# Patient Record
Sex: Female | Born: 1940 | Race: White | Hispanic: No | Smoking: Former smoker
Health system: Southern US, Community
[De-identification: ages and names within clinical notes are randomized; demographics above are authoritative.]

## PROBLEM LIST (undated history)

## (undated) DIAGNOSIS — M5136 Other intervertebral disc degeneration, lumbar region: Secondary | ICD-10-CM

## (undated) DIAGNOSIS — G43909 Migraine, unspecified, not intractable, without status migrainosus: Secondary | ICD-10-CM

## (undated) DIAGNOSIS — H269 Unspecified cataract: Secondary | ICD-10-CM

## (undated) DIAGNOSIS — M1990 Unspecified osteoarthritis, unspecified site: Secondary | ICD-10-CM

## (undated) DIAGNOSIS — F32A Depression, unspecified: Secondary | ICD-10-CM

## (undated) DIAGNOSIS — K219 Gastro-esophageal reflux disease without esophagitis: Secondary | ICD-10-CM

## (undated) DIAGNOSIS — S20419A Abrasion of unspecified back wall of thorax, initial encounter: Secondary | ICD-10-CM

## (undated) DIAGNOSIS — I2699 Other pulmonary embolism without acute cor pulmonale: Secondary | ICD-10-CM

## (undated) DIAGNOSIS — M51369 Other intervertebral disc degeneration, lumbar region without mention of lumbar back pain or lower extremity pain: Secondary | ICD-10-CM

## (undated) DIAGNOSIS — S32000A Wedge compression fracture of unspecified lumbar vertebra, initial encounter for closed fracture: Secondary | ICD-10-CM

## (undated) DIAGNOSIS — M810 Age-related osteoporosis without current pathological fracture: Secondary | ICD-10-CM

## (undated) DIAGNOSIS — K8020 Calculus of gallbladder without cholecystitis without obstruction: Secondary | ICD-10-CM

## (undated) DIAGNOSIS — S82841A Displaced bimalleolar fracture of right lower leg, initial encounter for closed fracture: Secondary | ICD-10-CM

## (undated) DIAGNOSIS — S5291XA Unspecified fracture of right forearm, initial encounter for closed fracture: Secondary | ICD-10-CM

## (undated) DIAGNOSIS — F329 Major depressive disorder, single episode, unspecified: Secondary | ICD-10-CM

## (undated) HISTORY — DX: Migraine, unspecified, not intractable, without status migrainosus: G43.909

## (undated) HISTORY — PX: APPENDECTOMY: SHX54

## (undated) HISTORY — DX: Depression, unspecified: F32.A

## (undated) HISTORY — PX: JOINT REPLACEMENT: SHX530

## (undated) HISTORY — PX: TONSILLECTOMY AND ADENOIDECTOMY: SUR1326

## (undated) HISTORY — DX: Age-related osteoporosis without current pathological fracture: M81.0

## (undated) HISTORY — PX: WISDOM TOOTH EXTRACTION: SHX21

## (undated) HISTORY — DX: Major depressive disorder, single episode, unspecified: F32.9

## (undated) HISTORY — PX: TOTAL KNEE ARTHROPLASTY: SHX125

## (undated) HISTORY — PX: ABDOMINAL HYSTERECTOMY: SHX81

## (undated) HISTORY — DX: Unspecified cataract: H26.9

## (undated) HISTORY — PX: TRIPLE SUBTALAR FUSION: SHX6633

## (undated) HISTORY — DX: Calculus of gallbladder without cholecystitis without obstruction: K80.20

## (undated) HISTORY — DX: Unspecified osteoarthritis, unspecified site: M19.90

## (undated) HISTORY — DX: Gastro-esophageal reflux disease without esophagitis: K21.9

## (undated) HISTORY — PX: FRACTURE SURGERY: SHX138

## (undated) HISTORY — PX: EYE SURGERY: SHX253

---

## 1998-11-30 ENCOUNTER — Emergency Department (HOSPITAL_COMMUNITY): Admission: EM | Admit: 1998-11-30 | Discharge: 1998-11-30 | Payer: Self-pay

## 2000-11-23 DIAGNOSIS — F32A Depression, unspecified: Secondary | ICD-10-CM | POA: Insufficient documentation

## 2014-03-06 DIAGNOSIS — K52839 Microscopic colitis, unspecified: Secondary | ICD-10-CM | POA: Insufficient documentation

## 2016-11-10 ENCOUNTER — Other Ambulatory Visit: Payer: Self-pay | Admitting: Pediatrics

## 2016-11-10 DIAGNOSIS — Z1231 Encounter for screening mammogram for malignant neoplasm of breast: Secondary | ICD-10-CM

## 2016-12-09 ENCOUNTER — Ambulatory Visit
Admission: RE | Admit: 2016-12-09 | Discharge: 2016-12-09 | Disposition: A | Discharge status: Home / Self Care | Payer: Medicare Other | Source: Ambulatory Visit | Attending: Pediatrics | Admitting: Pediatrics

## 2016-12-09 ENCOUNTER — Encounter (HOSPITAL_COMMUNITY): Payer: Self-pay

## 2016-12-09 DIAGNOSIS — Z1231 Encounter for screening mammogram for malignant neoplasm of breast: Secondary | ICD-10-CM | POA: Diagnosis present

## 2016-12-13 ENCOUNTER — Inpatient Hospital Stay
Admission: RE | Admit: 2016-12-13 | Discharge: 2016-12-13 | Disposition: A | Discharge status: Home / Self Care | Payer: Self-pay | Source: Ambulatory Visit | Attending: *Deleted | Admitting: *Deleted

## 2016-12-13 ENCOUNTER — Other Ambulatory Visit: Payer: Self-pay | Admitting: *Deleted

## 2016-12-13 DIAGNOSIS — Z9289 Personal history of other medical treatment: Secondary | ICD-10-CM

## 2017-04-13 ENCOUNTER — Other Ambulatory Visit: Payer: Self-pay | Admitting: Pediatrics

## 2017-04-13 DIAGNOSIS — M810 Age-related osteoporosis without current pathological fracture: Secondary | ICD-10-CM

## 2017-05-12 DIAGNOSIS — K8020 Calculus of gallbladder without cholecystitis without obstruction: Secondary | ICD-10-CM | POA: Insufficient documentation

## 2017-06-26 ENCOUNTER — Ambulatory Visit
Admission: RE | Admit: 2017-06-26 | Discharge: 2017-06-26 | Disposition: A | Discharge status: Home / Self Care | Payer: Medicare Other | Source: Ambulatory Visit | Attending: Pediatrics | Admitting: Pediatrics

## 2017-06-26 DIAGNOSIS — M810 Age-related osteoporosis without current pathological fracture: Secondary | ICD-10-CM | POA: Diagnosis present

## 2017-06-26 DIAGNOSIS — M818 Other osteoporosis without current pathological fracture: Secondary | ICD-10-CM | POA: Diagnosis not present

## 2017-07-31 ENCOUNTER — Encounter: Payer: Self-pay | Admitting: Family

## 2017-07-31 ENCOUNTER — Ambulatory Visit (INDEPENDENT_AMBULATORY_CARE_PROVIDER_SITE_OTHER): Payer: Medicare Other | Admitting: Family

## 2017-07-31 DIAGNOSIS — K219 Gastro-esophageal reflux disease without esophagitis: Secondary | ICD-10-CM | POA: Diagnosis not present

## 2017-07-31 DIAGNOSIS — Z1211 Encounter for screening for malignant neoplasm of colon: Secondary | ICD-10-CM

## 2017-07-31 DIAGNOSIS — G4700 Insomnia, unspecified: Secondary | ICD-10-CM | POA: Diagnosis not present

## 2017-07-31 DIAGNOSIS — F325 Major depressive disorder, single episode, in full remission: Secondary | ICD-10-CM | POA: Diagnosis not present

## 2017-07-31 DIAGNOSIS — Z23 Encounter for immunization: Secondary | ICD-10-CM

## 2017-07-31 NOTE — Assessment & Plan Note (Signed)
Stable on luvox. Will continue.

## 2017-07-31 NOTE — Assessment & Plan Note (Signed)
Stable on trazodone. Will continue

## 2017-07-31 NOTE — Progress Notes (Signed)
Subjective:    Patient ID: Claudia Andersen, female    DOB: [birth year], 76 y.o.   MRN: [id number]  CC: Claudia Andersen is a 76 y.o. female who presents today to establish care.    HPI: Has moved to [facility] and wanted closer pcp. Prior pcp had been in [place].  Feeling well today. No Complaints.   Dr Joselyn Arrow- compression fracture; pain controlled;  occasionally takes prn hydrocodone.   GERD- controlled on protonix  Insomnia- doing well on trazodone  Depression - on Luvox. Has been on for a long time. No thoughts or hurting herself or anyone else.   No longer does pap smears or colonscopy. NO  Personal h/o polyps. No family h/o colon cancer  h/o hysterectomy  Mammogram 2018 normal per patient  Interested in Cologuard       HISTORY:  No past medical history on file. No past surgical history on file. Family History  Problem Relation Age of Onset  . Breast cancer Neg Hx   . Colon cancer Neg Hx     Allergies: Aspirin and Nsaids No current outpatient prescriptions on file prior to visit.   No current facility-administered medications on file prior to visit.     Social History  Substance Use Topics  . Smoking status: Not on file  . Smokeless tobacco: Not on file  . Alcohol use Not on file    Review of Systems  Constitutional: Negative for chills and fever.  Respiratory: Negative for cough.   Cardiovascular: Negative for chest pain and palpitations.  Gastrointestinal: Negative for nausea and vomiting.  Psychiatric/Behavioral: Negative for suicidal ideas.      Objective:    BP 128/70   Pulse 66   Temp 97.6 F (36.4 C) (Oral)   Resp 16   Ht 5\' 2"  (1.575 m)   Wt 165 lb 3.2 oz (74.9 kg)   SpO2 93%   BMI 30.22 kg/m  BP Readings from Last 3 Encounters:  07/31/17 128/70   Wt Readings from Last 3 Encounters:  07/31/17 165 lb 3.2 oz (74.9 kg)    Physical Exam  Constitutional: She appears well-developed and well-nourished.  Eyes: Conjunctivae are  normal.  Cardiovascular: Normal rate, regular rhythm, normal heart sounds and normal pulses.   Pulmonary/Chest: Effort normal and breath sounds normal. She has no wheezes. She has no rhonchi. She has no rales.  Neurological: She is alert.  Skin: Skin is warm and dry.  Psychiatric: She has a normal mood and affect. Her speech is normal and behavior is normal. Thought content normal.  Vitals reviewed.      Assessment & Plan:   Problem List Items Addressed This Visit      Digestive   GERD (gastroesophageal reflux disease)    Stable. Controlled on PPI      Relevant Medications   pantoprazole (PROTONIX) 40 MG tablet     Other   Screen for colon cancer    cologuard ordered      Depression, major, single episode, complete remission (HCC)    Stable on luvox. Will continue.       Relevant Medications   traZODone (DESYREL) 50 MG tablet   fluvoxaMINE (LUVOX) 100 MG tablet   Insomnia    Stable on trazodone. Will continue          I am having Ms. Cutter maintain her traZODone, HYDROcodone-acetaminophen, fluvoxaMINE, pantoprazole, atorvastatin, naproxen sodium, aspirin EC, fluticasone, and calcium carbonate.   Meds ordered this encounter  Medications  .  traZODone (DESYREL) 50 MG tablet    Sig: Take 1-2 tablets by mouth at bedtime as needed.  Marland Kitchen HYDROcodone-acetaminophen (NORCO) 10-325 MG tablet    Sig: Take 1 tablet by mouth every 6 (six) hours.  . fluvoxaMINE (LUVOX) 100 MG tablet    Sig: Take 0.5-1 tablets by mouth daily.  . pantoprazole (PROTONIX) 40 MG tablet    Sig: Take 1 tablet by mouth 2 (two) times daily.  Marland Kitchen atorvastatin (LIPITOR) 10 MG tablet    Sig: Take 1 tablet by mouth daily.  . naproxen sodium (ANAPROX) 220 MG tablet    Sig: Take 220 mg by mouth.  Marland Kitchen aspirin EC 81 MG tablet    Sig: Take 81 mg by mouth daily.  . fluticasone (FLONASE) 50 MCG/ACT nasal spray    Sig: Place into both nostrils daily.  . calcium carbonate (OSCAL) 1500 (600 Ca) MG TABS tablet     Sig: Take by mouth 2 (two) times daily with a meal.    Return precautions given.   Risks, benefits, and alternatives of the medications and treatment plan prescribed today were discussed, and patient expressed understanding.   Education regarding symptom management and diagnosis given to patient on AVS.  Continue to follow with Burnard Hawthorne, FNP for routine health maintenance.   [name] and I agreed with plan.   Mable Paris, FNP

## 2017-07-31 NOTE — Assessment & Plan Note (Signed)
Stable Controlled on PPI 

## 2017-07-31 NOTE — Patient Instructions (Addendum)
My pleasure meeting you.  Cologuard ordered  Let me if you need anything.

## 2017-07-31 NOTE — Assessment & Plan Note (Signed)
cologuard ordered.

## 2017-08-07 ENCOUNTER — Encounter: Payer: Self-pay | Admitting: Family

## 2017-08-14 LAB — COLOGUARD

## 2017-08-16 ENCOUNTER — Telehealth: Payer: Self-pay | Admitting: Family

## 2017-08-16 NOTE — Telephone Encounter (Signed)
Patient was informed of results.  Patient understood and no questions, comments, or concerns at this time.  

## 2017-08-16 NOTE — Telephone Encounter (Signed)
Call pt     let  know Cologuard NEGATIVE which indicates lower likelihood of cancer or pre cancer. You will need to repeat test in 3 years or sooner if any symptoms including blood in stool, change in bowel habits.   

## 2017-08-28 ENCOUNTER — Telehealth: Payer: Self-pay | Admitting: Family

## 2017-08-28 ENCOUNTER — Other Ambulatory Visit: Payer: Self-pay | Admitting: *Deleted

## 2017-08-28 MED ORDER — FLUVOXAMINE MALEATE 100 MG PO TABS: 50.0000 mg | ORAL_TABLET | Freq: Every day | ORAL | 3 refills | Status: DC

## 2017-08-28 NOTE — Telephone Encounter (Signed)
Notified pt that requested medication has been refilled.

## 2017-08-28 NOTE — Telephone Encounter (Signed)
Copied from [place] [phone]. Topic: Quick Communication - See Telephone Encounter >> Aug 28, 2017 12:43 PM Hewitt Shorts wrote: CRM for notification. See Telephone encounter for:  Pt is needing a refill on fluvoxamine [facility] [address] [facility] number  44/46/19.

## 2017-09-27 ENCOUNTER — Telehealth: Payer: Self-pay

## 2017-09-27 NOTE — Telephone Encounter (Signed)
Pt. Called to report she is a new pt. Of Mrs. Arnett and when she saw her , forgot to tell her she takes Fosamax. She will have her drug store call us with dosage. She will be needed a refill soon.

## 2017-10-04 DIAGNOSIS — M7730 Calcaneal spur, unspecified foot: Secondary | ICD-10-CM | POA: Insufficient documentation

## 2017-10-25 ENCOUNTER — Other Ambulatory Visit: Payer: Self-pay | Admitting: *Deleted

## 2017-10-25 ENCOUNTER — Telehealth: Payer: Self-pay | Admitting: Family

## 2017-10-25 MED ORDER — PANTOPRAZOLE SODIUM 40 MG PO TBEC: 40.0000 mg | DELAYED_RELEASE_TABLET | Freq: Two times a day (BID) | ORAL | 3 refills | Status: DC

## 2017-10-25 NOTE — Telephone Encounter (Signed)
Copied from [place] [id number]. Topic: Quick Communication - Rx Refill/Question >> Oct 25, 2017  3:09 PM Yvette Rack wrote: Medication: pantoprazole (PROTONIX) 40 MG tablet   Has the patient contacted their pharmacy? Yes.  Patient states that she will call pharmacy as soon as she get off the phone with me    (Agent: If no, request that the patient contact the pharmacy for the refill.)   Preferred Pharmacy (with phone number or street name): [id number] [address], [place] [phone] (Phone) [phone] (Fax)     Agent: Please be advised that RX refills may take up to 3 business days. We ask that you follow-up with your pharmacy.

## 2017-10-25 NOTE — Telephone Encounter (Signed)
Rx refilled per visit 07/2017- 1 year per protocol

## 2017-11-17 ENCOUNTER — Telehealth: Payer: Self-pay | Admitting: Family

## 2017-11-17 NOTE — Telephone Encounter (Signed)
Attempted to call patient to schedule AWV. Patient did not answer and vm did not pick up. Will try to contact patient at a later time. SF

## 2017-12-12 ENCOUNTER — Encounter: Admission: RE | Payer: Self-pay | Source: Ambulatory Visit

## 2017-12-12 ENCOUNTER — Ambulatory Visit: Admission: RE | Admit: 2017-12-12 | Payer: Medicare Other | Source: Ambulatory Visit | Admitting: Ophthalmology

## 2017-12-12 SURGERY — PHACOEMULSIFICATION, CATARACT, WITH IOL INSERTION
Anesthesia: Choice | Laterality: Right

## 2017-12-13 ENCOUNTER — Other Ambulatory Visit: Payer: Self-pay | Admitting: Family

## 2017-12-13 DIAGNOSIS — Z1231 Encounter for screening mammogram for malignant neoplasm of breast: Secondary | ICD-10-CM

## 2018-01-31 ENCOUNTER — Ambulatory Visit
Admission: RE | Admit: 2018-01-31 | Discharge: 2018-01-31 | Disposition: A | Discharge status: Home / Self Care | Payer: Medicare Other | Source: Ambulatory Visit | Attending: Family | Admitting: Family

## 2018-01-31 DIAGNOSIS — Z1231 Encounter for screening mammogram for malignant neoplasm of breast: Secondary | ICD-10-CM | POA: Diagnosis not present

## 2018-02-12 ENCOUNTER — Encounter: Payer: Self-pay | Admitting: Family

## 2018-02-13 ENCOUNTER — Other Ambulatory Visit: Payer: Self-pay | Admitting: Family

## 2018-02-14 ENCOUNTER — Other Ambulatory Visit: Payer: Self-pay | Admitting: Family

## 2018-02-14 DIAGNOSIS — N390 Urinary tract infection, site not specified: Secondary | ICD-10-CM

## 2018-02-14 MED ORDER — NITROFURANTOIN MONOHYD MACRO 100 MG PO CAPS: 100.0000 mg | ORAL_CAPSULE | Freq: Two times a day (BID) | ORAL | 0 refills | Status: DC

## 2018-02-14 NOTE — Progress Notes (Signed)
close

## 2018-03-20 ENCOUNTER — Encounter: Payer: Self-pay | Admitting: *Deleted

## 2018-03-27 ENCOUNTER — Ambulatory Visit: Payer: Medicare Other | Admitting: Certified Registered Nurse Anesthetist

## 2018-03-27 ENCOUNTER — Ambulatory Visit
Admission: RE | Admit: 2018-03-27 | Discharge: 2018-03-27 | Disposition: A | Discharge status: Home / Self Care | Payer: Medicare Other | Source: Ambulatory Visit | Attending: Ophthalmology | Admitting: Ophthalmology

## 2018-03-27 ENCOUNTER — Encounter
Admission: RE | Disposition: A | Discharge status: Home / Self Care | Payer: Self-pay | Source: Ambulatory Visit | Attending: Ophthalmology

## 2018-03-27 DIAGNOSIS — K219 Gastro-esophageal reflux disease without esophagitis: Secondary | ICD-10-CM | POA: Diagnosis not present

## 2018-03-27 DIAGNOSIS — Z90710 Acquired absence of both cervix and uterus: Secondary | ICD-10-CM | POA: Insufficient documentation

## 2018-03-27 DIAGNOSIS — M810 Age-related osteoporosis without current pathological fracture: Secondary | ICD-10-CM | POA: Insufficient documentation

## 2018-03-27 DIAGNOSIS — H2511 Age-related nuclear cataract, right eye: Secondary | ICD-10-CM | POA: Insufficient documentation

## 2018-03-27 DIAGNOSIS — Z981 Arthrodesis status: Secondary | ICD-10-CM | POA: Insufficient documentation

## 2018-03-27 DIAGNOSIS — Z96652 Presence of left artificial knee joint: Secondary | ICD-10-CM | POA: Diagnosis not present

## 2018-03-27 DIAGNOSIS — E7800 Pure hypercholesterolemia, unspecified: Secondary | ICD-10-CM | POA: Insufficient documentation

## 2018-03-27 DIAGNOSIS — F329 Major depressive disorder, single episode, unspecified: Secondary | ICD-10-CM | POA: Insufficient documentation

## 2018-03-27 DIAGNOSIS — M1990 Unspecified osteoarthritis, unspecified site: Secondary | ICD-10-CM | POA: Diagnosis not present

## 2018-03-27 DIAGNOSIS — Z87891 Personal history of nicotine dependence: Secondary | ICD-10-CM | POA: Insufficient documentation

## 2018-03-27 HISTORY — DX: Abrasion of unspecified back wall of thorax, initial encounter: S20.419A

## 2018-03-27 HISTORY — PX: CATARACT EXTRACTION W/PHACO: SHX586

## 2018-03-27 SURGERY — PHACOEMULSIFICATION, CATARACT, WITH IOL INSERTION
Anesthesia: Monitor Anesthesia Care | Site: Eye | Laterality: Right | Wound class: Clean

## 2018-03-27 MED ORDER — LIDOCAINE HCL (PF) 4 % IJ SOLN
INTRAMUSCULAR | Status: AC
  Filled 2018-03-27: qty 5

## 2018-03-27 MED ORDER — EPINEPHRINE PF 1 MG/ML IJ SOLN
INTRAOCULAR | Status: DC | PRN
  Administered 2018-03-27: 1 mL via OPHTHALMIC

## 2018-03-27 MED ORDER — ARMC OPHTHALMIC DILATING DROPS
OPHTHALMIC | Status: AC
  Administered 2018-03-27: 1 via OPHTHALMIC
  Filled 2018-03-27: qty 0.4

## 2018-03-27 MED ORDER — MIDAZOLAM HCL 2 MG/2ML IJ SOLN
INTRAMUSCULAR | Status: DC | PRN
  Administered 2018-03-27 (×2): 1 mg via INTRAVENOUS

## 2018-03-27 MED ORDER — MOXIFLOXACIN HCL 0.5 % OP SOLN
OPHTHALMIC | Status: AC
  Filled 2018-03-27: qty 3

## 2018-03-27 MED ORDER — ARMC OPHTHALMIC DILATING DROPS
1.0000 "application " | OPHTHALMIC | Status: AC
  Administered 2018-03-27 (×3): 1 via OPHTHALMIC

## 2018-03-27 MED ORDER — POVIDONE-IODINE 5 % OP SOLN
OPHTHALMIC | Status: DC | PRN
  Administered 2018-03-27: 1 via OPHTHALMIC

## 2018-03-27 MED ORDER — NA CHONDROIT SULF-NA HYALURON 40-17 MG/ML IO SOLN
INTRAOCULAR | Status: DC | PRN
  Administered 2018-03-27: 1 mL via INTRAOCULAR

## 2018-03-27 MED ORDER — MOXIFLOXACIN HCL 0.5 % OP SOLN
OPHTHALMIC | Status: DC | PRN
  Administered 2018-03-27: .2 mL via OPHTHALMIC

## 2018-03-27 MED ORDER — POVIDONE-IODINE 5 % OP SOLN
OPHTHALMIC | Status: AC
  Filled 2018-03-27: qty 30

## 2018-03-27 MED ORDER — MIDAZOLAM HCL 2 MG/2ML IJ SOLN
INTRAMUSCULAR | Status: AC
  Filled 2018-03-27: qty 2

## 2018-03-27 MED ORDER — CARBACHOL 0.01 % IO SOLN
INTRAOCULAR | Status: DC | PRN
  Administered 2018-03-27: .5 mL via INTRAOCULAR

## 2018-03-27 MED ORDER — EPINEPHRINE PF 1 MG/ML IJ SOLN
INTRAMUSCULAR | Status: AC
  Filled 2018-03-27: qty 1

## 2018-03-27 MED ORDER — NA CHONDROIT SULF-NA HYALURON 40-17 MG/ML IO SOLN
INTRAOCULAR | Status: AC
  Filled 2018-03-27: qty 1

## 2018-03-27 MED ORDER — SODIUM CHLORIDE 0.9 % IV SOLN
INTRAVENOUS | Status: DC
  Administered 2018-03-27: 09:00:00 via INTRAVENOUS

## 2018-03-27 MED ORDER — LIDOCAINE HCL (PF) 4 % IJ SOLN
INTRAOCULAR | Status: DC | PRN
  Administered 2018-03-27: 2 mL via OPHTHALMIC

## 2018-03-27 MED ORDER — MOXIFLOXACIN HCL 0.5 % OP SOLN: 1.0000 [drp] | OPHTHALMIC | Status: DC | PRN

## 2018-03-27 SURGICAL SUPPLY — 16 items
GLOVE BIO SURGEON STRL SZ8 (GLOVE) ×3 IMPLANT
GLOVE BIOGEL M 6.5 STRL (GLOVE) ×3 IMPLANT
GLOVE SURG LX 8.0 MICRO (GLOVE) ×2
GLOVE SURG LX STRL 8.0 MICRO (GLOVE) ×1 IMPLANT
GOWN STRL REUS W/ TWL LRG LVL3 (GOWN DISPOSABLE) ×2 IMPLANT
GOWN STRL REUS W/TWL LRG LVL3 (GOWN DISPOSABLE) ×6
LABEL CATARACT MEDS ST (LABEL) ×3 IMPLANT
LENS IOL TECNIS ITEC 21.0 (Intraocular Lens) ×2 IMPLANT
PACK CATARACT (MISCELLANEOUS) ×3 IMPLANT
PACK CATARACT BRASINGTON LX (MISCELLANEOUS) ×3 IMPLANT
PACK EYE AFTER SURG (MISCELLANEOUS) ×3 IMPLANT
SOL BSS BAG (MISCELLANEOUS) ×3
SOLUTION BSS BAG (MISCELLANEOUS) ×1 IMPLANT
SYR 5ML LL (SYRINGE) ×3 IMPLANT
WATER STERILE IRR 250ML POUR (IV SOLUTION) ×3 IMPLANT
WIPE NON LINTING 3.25X3.25 (MISCELLANEOUS) ×3 IMPLANT

## 2018-03-27 NOTE — H&P (Signed)
All labs reviewed. Abnormal studies sent to patients PCP when indicated.  Previous H&P reviewed, patient examined, there are NO CHANGES.  Claudia Coil Porfilio6/11/20198:47 AM

## 2018-03-27 NOTE — Op Note (Signed)
PREOPERATIVE DIAGNOSIS:  Nuclear sclerotic cataract of the right eye.   POSTOPERATIVE DIAGNOSIS:  nuclear sclerotic cataract right eye   OPERATIVE PROCEDURE: Procedure(s): CATARACT EXTRACTION PHACO AND INTRAOCULAR LENS PLACEMENT (IOC)   SURGEON:  Birder Robson, MD.   ANESTHESIA:  Anesthesiologist: Alvin Critchley, MD CRNA: Eben Burow, CRNA; Hedda Slade, CRNA  1.      Managed anesthesia care. 2.      0.61ml of Shugarcaine was instilled in the eye following the paracentesis.   COMPLICATIONS:  None.   TECHNIQUE:   Stop and chop   DESCRIPTION OF PROCEDURE:  The patient was examined and consented in the preoperative holding area where the aforementioned topical anesthesia was applied to the right eye and then brought back to the Operating Room where the right eye was prepped and draped in the usual sterile ophthalmic fashion and a lid speculum was placed. A paracentesis was created with the side port blade and the anterior chamber was filled with viscoelastic. A near clear corneal incision was performed with the steel keratome. A continuous curvilinear capsulorrhexis was performed with a cystotome followed by the capsulorrhexis forceps. Hydrodissection and hydrodelineation were carried out with BSS on a blunt cannula. The lens was removed in a stop and chop  technique and the remaining cortical material was removed with the irrigation-aspiration handpiece. The capsular bag was inflated with viscoelastic and the Technis ZCB00  lens was placed in the capsular bag without complication. The remaining viscoelastic was removed from the eye with the irrigation-aspiration handpiece. The wounds were hydrated. The anterior chamber was flushed with Miostat and the eye was inflated to physiologic pressure. 0.44ml of Vigamox was placed in the anterior chamber. The wounds were found to be water tight. The eye was dressed with Vigamox. The patient was given protective glasses to wear throughout the day and a  shield with which to sleep tonight. The patient was also given drops with which to begin a drop regimen today and will follow-up with me in one day. Implant Name Type Inv. Item Serial No. Manufacturer Lot No. LRB No. Used  LENS IOL DIOP 21.0 - [id number] Intraocular Lens LENS IOL DIOP 21.0 [id number] [id number]  Right 1   Procedure(s) with comments: CATARACT EXTRACTION PHACO AND INTRAOCULAR LENS PLACEMENT (IOC) (Right) - [place] 00:33 AP% 15.3 CDE 5.18 Fluid pack lot # [id number]  Electronically signed: Birder Robson 03/27/2018 9:09 AM

## 2018-03-27 NOTE — Transfer of Care (Signed)
Immediate Anesthesia Transfer of Care Note  Patient: Claudia Andersen  Procedure(s) Performed: CATARACT EXTRACTION PHACO AND INTRAOCULAR LENS PLACEMENT (IOC) (Right Eye)  Patient Location: PACU  Anesthesia Type:MAC  Level of Consciousness: awake, alert  and oriented  Airway & Oxygen Therapy: Patient Spontanous Breathing  Post-op Assessment: Report given to RN and Post -op Vital signs reviewed and stable  Post vital signs: Reviewed and stable  Last Vitals:  Vitals Value Taken Time  BP    Temp 35.6 C 03/27/2018  9:11 AM  Pulse 61 03/27/2018  9:11 AM  Resp 12 03/27/2018  9:11 AM  SpO2 99 % 03/27/2018  9:11 AM    Last Pain:  Vitals:   03/27/18 0908  TempSrc: Temporal  PainSc: 0-No pain         Complications: No apparent anesthesia complications

## 2018-03-27 NOTE — Discharge Instructions (Signed)
Eye Surgery Discharge Instructions  Expect mild scratchy sensation or mild soreness. DO NOT RUB YOUR EYE!  The day of surgery:  Minimal physical activity, but bed rest is not required  No reading, computer work, or close hand work  No bending, lifting, or straining.  May watch TV  For 24 hours:  No driving, legal decisions, or alcoholic beverages  Safety precautions  Eat anything you prefer: It is better to start with liquids, then soup then solid foods.  _____ Eye patch should be worn until postoperative exam tomorrow.  ____ Solar shield eyeglasses should be worn for comfort in the sunlight/patch while sleeping  Resume all regular medications including aspirin or Coumadin if these were discontinued prior to surgery. You may shower, bathe, shave, or wash your hair. Tylenol may be taken for mild discomfort.  Call your doctor if you experience significant pain, nausea, or vomiting, fever > 101 or other signs of infection. [phone] or [phone] Specific instructions:  Follow-up Information    Birder Robson, MD Follow up.   Specialty:  Ophthalmology Why:  June 11 at 1:50pm Contact information: [place] [address] [phone]

## 2018-03-27 NOTE — Anesthesia Post-op Follow-up Note (Signed)
Anesthesia QCDR form completed.        

## 2018-03-27 NOTE — Anesthesia Preprocedure Evaluation (Addendum)
Anesthesia Evaluation  Patient identified by MRN, date of birth, ID band Patient awake    Reviewed: Allergy & Precautions, NPO status , Patient's Chart, lab work & pertinent test results  Airway Mallampati: III  TM Distance: <3 FB     Dental   Pulmonary former smoker,    Pulmonary exam normal        Cardiovascular negative cardio ROS Normal cardiovascular exam     Neuro/Psych  Headaches, PSYCHIATRIC DISORDERS Depression    GI/Hepatic Neg liver ROS, GERD  ,  Endo/Other  negative endocrine ROS  Renal/GU negative Renal ROS  negative genitourinary   Musculoskeletal  (+) Arthritis , Osteoarthritis,    Abdominal Normal abdominal exam  (+)   Peds negative pediatric ROS (+)  Hematology negative hematology ROS (+)   Anesthesia Other Findings   Reproductive/Obstetrics                            Anesthesia Physical Anesthesia Plan  ASA: II  Anesthesia Plan: MAC   Post-op Pain Management:    Induction:   PONV Risk Score and Plan:   Airway Management Planned: Nasal Cannula  Additional Equipment:   Intra-op Plan:   Post-operative Plan:   Informed Consent: I have reviewed the patients History and Physical, chart, labs and discussed the procedure including the risks, benefits and alternatives for the proposed anesthesia with the patient or authorized representative who has indicated his/her understanding and acceptance.   Dental advisory given  Plan Discussed with: CRNA and Surgeon  Anesthesia Plan Comments:         Anesthesia Quick Evaluation

## 2018-03-27 NOTE — Anesthesia Postprocedure Evaluation (Signed)
Anesthesia Post Note  Patient: Claudia Andersen  Procedure(s) Performed: CATARACT EXTRACTION PHACO AND INTRAOCULAR LENS PLACEMENT ([place]) (Right Eye)  Patient location during evaluation: PACU Anesthesia Type: MAC Level of consciousness: awake Pain management: pain level controlled Vital Signs Assessment: post-procedure vital signs reviewed and stable Respiratory status: spontaneous breathing Cardiovascular status: blood pressure returned to baseline Postop Assessment: no apparent nausea or vomiting Anesthetic complications: no     Last Vitals:  Vitals:   03/27/18 0908 03/27/18 0911  BP:    Pulse: 62 61  Resp: 16 12  Temp: (!) 35.6 C (!) 35.6 C  SpO2: 100% 99%    Last Pain:  Vitals:   03/27/18 0908  TempSrc: Temporal  PainSc: 0-No pain                 Jasher Barkan Lorenza Chick

## 2018-04-09 ENCOUNTER — Other Ambulatory Visit: Payer: Self-pay | Admitting: Family

## 2018-06-19 ENCOUNTER — Encounter: Payer: Self-pay | Admitting: *Deleted

## 2018-06-26 ENCOUNTER — Ambulatory Visit
Admission: RE | Admit: 2018-06-26 | Discharge: 2018-06-26 | Disposition: A | Discharge status: Home / Self Care | Payer: Medicare Other | Source: Ambulatory Visit | Attending: Ophthalmology | Admitting: Ophthalmology

## 2018-06-26 ENCOUNTER — Ambulatory Visit: Payer: Medicare Other | Admitting: Anesthesiology

## 2018-06-26 ENCOUNTER — Encounter
Admission: RE | Disposition: A | Discharge status: Home / Self Care | Payer: Self-pay | Source: Ambulatory Visit | Attending: Ophthalmology

## 2018-06-26 ENCOUNTER — Encounter: Payer: Self-pay | Admitting: Anesthesiology

## 2018-06-26 ENCOUNTER — Other Ambulatory Visit: Payer: Self-pay

## 2018-06-26 DIAGNOSIS — M810 Age-related osteoporosis without current pathological fracture: Secondary | ICD-10-CM | POA: Insufficient documentation

## 2018-06-26 DIAGNOSIS — H2512 Age-related nuclear cataract, left eye: Secondary | ICD-10-CM | POA: Insufficient documentation

## 2018-06-26 DIAGNOSIS — Z79899 Other long term (current) drug therapy: Secondary | ICD-10-CM | POA: Insufficient documentation

## 2018-06-26 DIAGNOSIS — Z87891 Personal history of nicotine dependence: Secondary | ICD-10-CM | POA: Insufficient documentation

## 2018-06-26 DIAGNOSIS — Z7982 Long term (current) use of aspirin: Secondary | ICD-10-CM | POA: Diagnosis not present

## 2018-06-26 DIAGNOSIS — M1990 Unspecified osteoarthritis, unspecified site: Secondary | ICD-10-CM | POA: Insufficient documentation

## 2018-06-26 DIAGNOSIS — E7800 Pure hypercholesterolemia, unspecified: Secondary | ICD-10-CM | POA: Insufficient documentation

## 2018-06-26 DIAGNOSIS — F329 Major depressive disorder, single episode, unspecified: Secondary | ICD-10-CM | POA: Insufficient documentation

## 2018-06-26 DIAGNOSIS — K219 Gastro-esophageal reflux disease without esophagitis: Secondary | ICD-10-CM | POA: Insufficient documentation

## 2018-06-26 HISTORY — PX: CATARACT EXTRACTION W/PHACO: SHX586

## 2018-06-26 SURGERY — PHACOEMULSIFICATION, CATARACT, WITH IOL INSERTION
Anesthesia: Monitor Anesthesia Care | Site: Eye | Laterality: Left | Wound class: Clean

## 2018-06-26 MED ORDER — MIDAZOLAM HCL 2 MG/2ML IJ SOLN
INTRAMUSCULAR | Status: DC | PRN
  Administered 2018-06-26: 1 mg via INTRAVENOUS
  Administered 2018-06-26 (×2): 0.5 mg via INTRAVENOUS

## 2018-06-26 MED ORDER — POVIDONE-IODINE 5 % OP SOLN
OPHTHALMIC | Status: DC | PRN
  Administered 2018-06-26: 1 via OPHTHALMIC

## 2018-06-26 MED ORDER — EPINEPHRINE PF 1 MG/ML IJ SOLN
INTRAOCULAR | Status: DC | PRN
  Administered 2018-06-26: 1 mL via OPHTHALMIC

## 2018-06-26 MED ORDER — NA CHONDROIT SULF-NA HYALURON 40-17 MG/ML IO SOLN
INTRAOCULAR | Status: DC | PRN
  Administered 2018-06-26: 1 mL via INTRAOCULAR

## 2018-06-26 MED ORDER — POVIDONE-IODINE 5 % OP SOLN
OPHTHALMIC | Status: AC
  Filled 2018-06-26: qty 30

## 2018-06-26 MED ORDER — NA CHONDROIT SULF-NA HYALURON 40-17 MG/ML IO SOLN
INTRAOCULAR | Status: AC
  Filled 2018-06-26: qty 1

## 2018-06-26 MED ORDER — CARBACHOL 0.01 % IO SOLN
INTRAOCULAR | Status: DC | PRN
  Administered 2018-06-26: .5 mL via INTRAOCULAR

## 2018-06-26 MED ORDER — LIDOCAINE HCL (PF) 4 % IJ SOLN
INTRAOCULAR | Status: DC | PRN
  Administered 2018-06-26: 2 mL via OPHTHALMIC

## 2018-06-26 MED ORDER — SODIUM CHLORIDE 0.9 % IV SOLN
INTRAVENOUS | Status: DC
  Administered 2018-06-26: 07:00:00 via INTRAVENOUS

## 2018-06-26 MED ORDER — LIDOCAINE HCL (PF) 4 % IJ SOLN
INTRAMUSCULAR | Status: AC
  Filled 2018-06-26: qty 5

## 2018-06-26 MED ORDER — MIDAZOLAM HCL 2 MG/2ML IJ SOLN
INTRAMUSCULAR | Status: AC
  Filled 2018-06-26: qty 2

## 2018-06-26 MED ORDER — MOXIFLOXACIN HCL 0.5 % OP SOLN
OPHTHALMIC | Status: DC | PRN
  Administered 2018-06-26: .2 mL via OPHTHALMIC

## 2018-06-26 MED ORDER — MOXIFLOXACIN HCL 0.5 % OP SOLN
OPHTHALMIC | Status: AC
  Filled 2018-06-26: qty 3

## 2018-06-26 MED ORDER — ARMC OPHTHALMIC DILATING DROPS
OPHTHALMIC | Status: AC
  Administered 2018-06-26: 1 via OPHTHALMIC
  Filled 2018-06-26: qty 0.5

## 2018-06-26 MED ORDER — EPINEPHRINE PF 1 MG/ML IJ SOLN
INTRAMUSCULAR | Status: AC
  Filled 2018-06-26: qty 1

## 2018-06-26 MED ORDER — MOXIFLOXACIN HCL 0.5 % OP SOLN: 1.0000 [drp] | OPHTHALMIC | Status: DC | PRN

## 2018-06-26 MED ORDER — ARMC OPHTHALMIC DILATING DROPS
1.0000 "application " | OPHTHALMIC | Status: AC
  Administered 2018-06-26 (×3): 1 via OPHTHALMIC

## 2018-06-26 SURGICAL SUPPLY — 16 items
GLOVE BIO SURGEON STRL SZ8 (GLOVE) ×3 IMPLANT
GLOVE BIOGEL M 6.5 STRL (GLOVE) ×3 IMPLANT
GLOVE SURG LX 8.0 MICRO (GLOVE) ×2
GLOVE SURG LX STRL 8.0 MICRO (GLOVE) ×1 IMPLANT
GOWN STRL REUS W/ TWL LRG LVL3 (GOWN DISPOSABLE) ×2 IMPLANT
GOWN STRL REUS W/TWL LRG LVL3 (GOWN DISPOSABLE) ×6
LABEL CATARACT MEDS ST (LABEL) ×3 IMPLANT
LENS IOL TECNIS ITEC 21.5 (Intraocular Lens) ×2 IMPLANT
PACK CATARACT (MISCELLANEOUS) ×3 IMPLANT
PACK CATARACT BRASINGTON LX (MISCELLANEOUS) ×3 IMPLANT
PACK EYE AFTER SURG (MISCELLANEOUS) ×3 IMPLANT
SOL BSS BAG (MISCELLANEOUS) ×3
SOLUTION BSS BAG (MISCELLANEOUS) ×1 IMPLANT
SYR 5ML LL (SYRINGE) ×3 IMPLANT
WATER STERILE IRR 250ML POUR (IV SOLUTION) ×3 IMPLANT
WIPE NON LINTING 3.25X3.25 (MISCELLANEOUS) ×3 IMPLANT

## 2018-06-26 NOTE — Anesthesia Postprocedure Evaluation (Signed)
Anesthesia Post Note  Patient: SHIRA BOBST  Procedure(s) Performed: CATARACT EXTRACTION PHACO AND INTRAOCULAR LENS PLACEMENT (IOC) (Left Eye)  Patient location during evaluation: Short Stay Anesthesia Type: MAC Level of consciousness: awake and alert, oriented and patient cooperative Pain management: pain level controlled Vital Signs Assessment: post-procedure vital signs reviewed and stable Respiratory status: spontaneous breathing, respiratory function stable and nonlabored ventilation Cardiovascular status: blood pressure returned to baseline and stable Postop Assessment: no headache and no apparent nausea or vomiting Anesthetic complications: yes     Last Vitals:  Vitals:   06/26/18 0649 06/26/18 0805  BP: 130/64 135/80  Pulse: 64 (!) 59  Resp: 17 16  Temp: (!) 35.8 C (!) 36.1 C  SpO2: 98% 100%    Last Pain:  Vitals:   06/26/18 0805  TempSrc: Temporal  PainSc: 0-No pain                 Eben Burow

## 2018-06-26 NOTE — Op Note (Signed)
PREOPERATIVE DIAGNOSIS:  Nuclear sclerotic cataract of the left eye.   POSTOPERATIVE DIAGNOSIS:  Nuclear sclerotic cataract of the left eye.   OPERATIVE PROCEDURE: Procedure(s): CATARACT EXTRACTION PHACO AND INTRAOCULAR LENS PLACEMENT (IOC)   SURGEON:  Birder Robson, MD.   ANESTHESIA:  Anesthesiologist: Gunnar Bulla, MD CRNA: Eben Burow, CRNA  1.      Managed anesthesia care. 2.     0.60ml of Shugarcaine was instilled following the paracentesis   COMPLICATIONS:  None.   TECHNIQUE:   Stop and chop   DESCRIPTION OF PROCEDURE:  The patient was examined and consented in the preoperative holding area where the aforementioned topical anesthesia was applied to the left eye and then brought back to the Operating Room where the left eye was prepped and draped in the usual sterile ophthalmic fashion and a lid speculum was placed. A paracentesis was created with the side port blade and the anterior chamber was filled with viscoelastic. A near clear corneal incision was performed with the steel keratome. A continuous curvilinear capsulorrhexis was performed with a cystotome followed by the capsulorrhexis forceps. Hydrodissection and hydrodelineation were carried out with BSS on a blunt cannula. The lens was removed in a stop and chop  technique and the remaining cortical material was removed with the irrigation-aspiration handpiece. The capsular bag was inflated with viscoelastic and the Technis ZCB00 lens was placed in the capsular bag without complication. The remaining viscoelastic was removed from the eye with the irrigation-aspiration handpiece. The wounds were hydrated. The anterior chamber was flushed with Miostat and the eye was inflated to physiologic pressure. 0.22ml Vigamox was placed in the anterior chamber. The wounds were found to be water tight. The eye was dressed with Vigamox. The patient was given protective glasses to wear throughout the day and a shield with which to sleep  tonight. The patient was also given drops with which to begin a drop regimen today and will follow-up with me in one day. Implant Name Type Inv. Item Serial No. Manufacturer Lot No. LRB No. Used  LENS IOL DIOP 21.5 - [id number] Intraocular Lens LENS IOL DIOP 21.5 [id number]  Left 1    Procedure(s) with comments: CATARACT EXTRACTION PHACO AND INTRAOCULAR LENS PLACEMENT (IOC) (Left) - [place] 00:38 AP% 15.2 CDE 5.80 Fluid pack lot # [id number]  Electronically signed: Birder Robson 06/26/2018 8:03 AM

## 2018-06-26 NOTE — Anesthesia Post-op Follow-up Note (Signed)
Anesthesia QCDR form completed.        

## 2018-06-26 NOTE — H&P (Signed)
All labs reviewed. Abnormal studies sent to patients PCP when indicated.  Previous H&P reviewed, patient examined, there are NO CHANGES.  Claudia Savage Porfilio9/10/20197:43 AM

## 2018-06-26 NOTE — Discharge Instructions (Addendum)
Eye Surgery Discharge Instructions  Expect mild scratchy sensation or mild soreness. DO NOT RUB YOUR EYE!  The day of surgery:  Minimal physical activity, but bed rest is not required  No reading, computer work, or close hand work  No bending, lifting, or straining.  May watch TV  For 24 hours:  No driving, legal decisions, or alcoholic beverages  Safety precautions  Eat anything you prefer: It is better to start with liquids, then soup then solid foods.  Solar shield eyeglasses should be worn for comfort in the sunlight/patch while sleeping  Resume all regular medications including aspirin or Coumadin if these were discontinued prior to surgery. You may shower, bathe, shave, or wash your hair. Tylenol may be taken for mild discomfort. FOLLOW DR. PORFILIO'S EYE DROP INSTRUCTION SHEET AS REVIEWED.  Call your doctor if you experience significant pain, nausea, or vomiting, fever > 101 or other signs of infection. [phone] or [phone] Specific instructions:  Follow-up Information    Birder Robson, MD Follow up.   Specialty:  Ophthalmology Why:  06/27/18 @ 9:00 am Contact information: [place] [address] [phone]

## 2018-06-26 NOTE — Anesthesia Preprocedure Evaluation (Signed)
Anesthesia Evaluation  Patient identified by MRN, date of birth, ID band Patient awake    Reviewed: Allergy & Precautions, NPO status , Patient's Chart, lab work & pertinent test results, reviewed documented beta blocker date and time   Airway Mallampati: II  TM Distance: >3 FB     Dental  (+) Chipped   Pulmonary former smoker,           Cardiovascular      Neuro/Psych  Headaches, PSYCHIATRIC DISORDERS Depression    GI/Hepatic GERD  Controlled,  Endo/Other    Renal/GU      Musculoskeletal  (+) Arthritis ,   Abdominal   Peds  Hematology   Anesthesia Other Findings   Reproductive/Obstetrics                             Anesthesia Physical Anesthesia Plan  ASA: III  Anesthesia Plan: MAC   Post-op Pain Management:    Induction:   PONV Risk Score and Plan:   Airway Management Planned:   Additional Equipment:   Intra-op Plan:   Post-operative Plan:   Informed Consent: I have reviewed the patients History and Physical, chart, labs and discussed the procedure including the risks, benefits and alternatives for the proposed anesthesia with the patient or authorized representative who has indicated his/her understanding and acceptance.     Plan Discussed with: CRNA  Anesthesia Plan Comments:         Anesthesia Quick Evaluation

## 2018-06-26 NOTE — Transfer of Care (Signed)
Immediate Anesthesia Transfer of Care Note  Patient: Claudia Andersen  Procedure(s) Performed: CATARACT EXTRACTION PHACO AND INTRAOCULAR LENS PLACEMENT (IOC) (Left Eye)  Patient Location: Short Stay  Anesthesia Type:MAC  Level of Consciousness: awake, alert , oriented and patient cooperative  Airway & Oxygen Therapy: Patient Spontanous Breathing  Post-op Assessment: Report given to RN and Post -op Vital signs reviewed and stable  Post vital signs: Reviewed and stable  Last Vitals:  Vitals Value Taken Time  BP    Temp    Pulse    Resp    SpO2      Last Pain:  Vitals:   06/26/18 0649  TempSrc: Tympanic  PainSc: 0-No pain         Complications: No apparent anesthesia complications

## 2018-06-27 ENCOUNTER — Encounter: Payer: Self-pay | Admitting: Ophthalmology

## 2018-07-05 ENCOUNTER — Other Ambulatory Visit: Payer: Self-pay | Admitting: Family

## 2018-07-05 NOTE — Telephone Encounter (Signed)
Last OV 07/31/2017   Last refilled 04/10/2018 disp 90 with no refills   Pt is due for an OV call pt to schedule for an appt for more refills

## 2018-07-19 ENCOUNTER — Ambulatory Visit (INDEPENDENT_AMBULATORY_CARE_PROVIDER_SITE_OTHER): Payer: Medicare Other

## 2018-07-19 VITALS — BP 110/70 | HR 66 | Temp 98.1°F | Resp 15 | Wt 160.1 lb

## 2018-07-19 DIAGNOSIS — Z23 Encounter for immunization: Secondary | ICD-10-CM

## 2018-07-19 DIAGNOSIS — Z0000 Encounter for general adult medical examination without abnormal findings: Secondary | ICD-10-CM | POA: Diagnosis not present

## 2018-07-19 NOTE — Progress Notes (Signed)
Subjective:   Claudia Andersen is a 77 y.o. female who presents for an Initial Medicare Annual Wellness Visit.  Review of Systems    No ROS.  Medicare Wellness Visit. Additional risk factors are reflected in the social history.  Cardiac Risk Factors include: advanced age (>44men, >71 women)     Objective:    Today's Vitals   07/19/18 1640  BP: 110/70  Pulse: 66  Resp: 15  Temp: 98.1 F (36.7 C)  TempSrc: Oral  SpO2: 95%  Weight: 160 lb 1.9 oz (72.6 kg)   Body mass index is 29.29 kg/m.  Advanced Directives 07/19/2018 06/26/2018  Does Patient Have a Medical Advance Directive? Yes Yes  Type of Paramedic of [place];Living will [place];Living will  Does patient want to make changes to medical advance directive? No - Patient declined No - Patient declined  Copy of [place] in Chart? No - copy requested No - copy requested    Current Medications (verified) Outpatient Encounter Medications as of 07/19/2018  Medication Sig  . alendronate (FOSAMAX) 70 MG tablet Take 70 mg by mouth once a week. Take with a full glass of water on an empty stomach.  Marland Kitchen aspirin EC 81 MG tablet Take 81 mg by mouth daily.  Marland Kitchen atorvastatin (LIPITOR) 10 MG tablet Take 10 mg by mouth daily.   . Calcium Carb-Cholecalciferol (CALCIUM 600 + D PO) Take 2 tablets by mouth daily.   . Fluticasone Furoate (FLONASE SENSIMIST NA) Place 1 spray into both nostrils daily as needed (allergies).  . fluvoxaMINE (LUVOX) 100 MG tablet TAKE 0.5-1 TABLETS (50-100 MG TOTAL) DAILY BY MOUTH.  . naproxen sodium (ANAPROX) 220 MG tablet Take 220 mg by mouth daily as needed (pain).   . pantoprazole (PROTONIX) 40 MG tablet Take 1 tablet (40 mg total) by mouth 2 (two) times daily.  . traZODone (DESYREL) 50 MG tablet Take 50 mg by mouth at bedtime.   . [DISCONTINUED] Carboxymethylcell-Hypromellose (GENTEAL OP) Place 1 drop into both eyes daily as needed (dry eyes).  .  [DISCONTINUED] nitrofurantoin, macrocrystal-monohydrate, (MACROBID) 100 MG capsule Take 1 capsule (100 mg total) by mouth 2 (two) times daily. Take with food. (Patient not taking: Reported on 03/20/2018)   No facility-administered encounter medications on file as of 07/19/2018.     Allergies (verified) Patient has no known allergies.   History: Past Medical History:  Diagnosis Date  . Arthritis   . Back abrasion    CRUSHED VERTEBRAE  . Depression   . GERD (gastroesophageal reflux disease)   . Migraines    Past Surgical History:  Procedure Laterality Date  . ABDOMINAL HYSTERECTOMY    . CATARACT EXTRACTION W/PHACO Right 03/27/2018   Procedure: CATARACT EXTRACTION PHACO AND INTRAOCULAR LENS PLACEMENT (IOC);  Surgeon: Birder Robson, MD;  Location: [facility] ORS;  Service: Ophthalmology;  Laterality: Right;  [place] 00:33 AP% 15.3 CDE 5.18 Fluid pack lot # [id number]  . CATARACT EXTRACTION W/PHACO Left 06/26/2018   Procedure: CATARACT EXTRACTION PHACO AND INTRAOCULAR LENS PLACEMENT ([place]);  Surgeon: Birder Robson, MD;  Location: [facility] ORS;  Service: Ophthalmology;  Laterality: Left;  [place] 00:38 AP% 15.2 CDE 5.80 Fluid pack lot # [id number]  . FRACTURE SURGERY     RIGHT RADIAL  . JOINT REPLACEMENT     LEFT TKR  . TRIPLE SUBTALAR FUSION     Family History  Problem Relation Age of Onset  . Arthritis Mother   . Heart disease Mother   .  Stroke Mother   . Arthritis Father   . Heart disease Father   . Stroke Father   . Breast cancer Neg Hx   . Colon cancer Neg Hx    Social History   Socioeconomic History  . Marital status: Divorced    Spouse name: Not on file  . Number of children: Not on file  . Years of education: Not on file  . Highest education level: Not on file  Occupational History  . Not on file  Social Needs  . Financial resource strain: Not hard at all  . Food insecurity:    Worry: Never true    Inability: Never true  . Transportation needs:    Medical: No     Non-medical: No  Tobacco Use  . Smoking status: Former Research scientist (life sciences)  . Smokeless tobacco: Never Used  Substance and Sexual Activity  . Alcohol use: Yes    Comment: 5-7 glasses per week  . Drug use: Not on file  . Sexual activity: Not on file  Lifestyle  . Physical activity:    Days per week: 7 days    Minutes per session: 60 min  . Stress: Not at all  Relationships  . Social connections:    Talks on phone: Not on file    Gets together: Not on file    Attends religious service: Not on file    Active member of club or organization: Not on file    Attends meetings of clubs or organizations: Not on file    Relationship status: Not on file  Other Topics Concern  . 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     Tobacco Counseling Counseling given: Not Answered   Clinical Intake:                        Activities of Daily Living In your present state of health, do you have any difficulty performing the following activities: 07/19/2018 03/27/2018  Hearing? N N  Vision? N N  Difficulty concentrating or making decisions? N N  Walking or climbing stairs? N N  Dressing or bathing? N N  Doing errands, shopping? N -  Preparing Food and eating ? N -  Using the Toilet? N -  In the past six months, have you accidently leaked urine? N -  Do you have problems with loss of bowel control? N -  Managing your Medications? N -  Managing your Finances? N -  Housekeeping or managing your Housekeeping? N -  Some recent data might be hidden     Immunizations and Health Maintenance Immunization History  Administered Date(s) Administered  . Influenza, High Dose Seasonal PF 07/31/2017, 07/19/2018   Health Maintenance Due  Topic Date Due  . TETANUS/TDAP  05/27/1961  . PNA vac Low Risk Adult (1 of 2 - PCV13) 05/28/2007    Patient Care Team: Burnard Hawthorne, FNP as PCP - General (Family Medicine)  Indicate any recent Medical Services you  may have received from other than [facility] providers in the past year (date may be approximate).     Assessment:   This is a routine wellness examination for Claudia Andersen.  The goal of the wellness visit is to assist the patient how to close the gaps in care and create a preventative care plan for the patient.   The roster of all physicians providing medical care to patient is listed in  the Snapshot section of the chart.  Taking calcium VIT D as appropriate/Osteoporosis  reviewed.    Safety issues reviewed; Smoke and carbon monoxide detectors in the home. No firearms in the home. Wears seatbelts when driving or riding with others. No violence in the home.  They do not have excessive sun exposure.  Discussed the need for sun protection: hats, long sleeves and the use of sunscreen if there is significant sun exposure.  No new identified risk were noted.  No failures at ADL's or IADL's.   BMI- discussed the importance of a healthy diet, water intake and the benefits of aerobic exercise. Educational material provided.   Dental- every 4 months.  Eye- Visual acuity not assessed per patient preference since they have regular follow up with the ophthalmologist.  Wears corrective lenses.  Sleep patterns- Sleeps 6-7 hours through the night.   High dose influenza vaccine administered L deltoid, tolerated well. No complaint during or after administration. Educational material provided.  TDAP vaccine deferred per patient preference.  Follow up with insurance.  Educational material provided.  Hearing/Vision screen Hearing Screening Comments: Patient is able to hear conversational tones without difficulty.  No issues reported.   Vision Screening Comments: Followed by [facility] Wears corrective lenses Last OV 06/2018 Cataract extraction, bilateral Visual acuity not assessed per patient preference since they have regular follow up with the ophthalmologist  Dietary issues and exercise  activities discussed: Current Exercise Habits: Home exercise routine, Type of exercise: walking, Time (Minutes): 60, Frequency (Times/Week): 7, Weekly Exercise (Minutes/Week): 420, Intensity: Moderate  Goals      Patient Stated   . Lose Weight (pt-stated)     Healthy diet      Depression Screen PHQ 2/9 Scores 07/19/2018 07/31/2017  PHQ - 2 Score 0 0  PHQ- 9 Score - 0    Fall Risk Fall Risk  07/19/2018 07/31/2017  Falls in the past year? Yes No  Number falls in past yr: 1 -  Injury with Fall? No -  Comment Bruise. No injury. Slipped on wet bath mat. Safety railing now installed.  -  Follow up Falls prevention discussed;Education provided -   Cognitive Function: MMSE - Mini Mental State Exam 07/19/2018  Orientation to time 5  Orientation to Place 5  Registration 3  Attention/ Calculation 5  Recall 3  Language- name 2 objects 2  Language- repeat 1  Language- follow 3 step command 3  Language- read & follow direction 1  Write a sentence 1  Copy design 1  Total score 30       Screening Tests Health Maintenance  Topic Date Due  . TETANUS/TDAP  05/27/1961  . PNA vac Low Risk Adult (1 of 2 - PCV13) 05/28/2007  . INFLUENZA VACCINE  Completed  . DEXA SCAN  Completed     Plan:   End of life planning; Advance aging; Advanced directives discussed. Copy of current HCPOA/Living Will requested.    I have personally reviewed and noted the following in the patient's chart:   . Medical and social history . Use of alcohol, tobacco or illicit drugs  . Current medications and supplements . Functional ability and status . Nutritional status . Physical activity . Advanced directives . List of other physicians . Hospitalizations, surgeries, and ER visits in previous 12 months . Vitals . Screenings to include cognitive, depression, and falls . Referrals and appointments  In addition, I have reviewed and discussed with patient certain preventive protocols, quality metrics, and  best practice recommendations. A written personalized care plan for preventive services as well as general preventive health recommendations were provided to patient.    Agree with above  Dr. [place], [place], LPN   32/11/5670

## 2018-07-19 NOTE — Patient Instructions (Addendum)
  Claudia Andersen , Thank you for taking time to come for your Medicare Wellness Visit. I appreciate your ongoing commitment to your health goals. Please review the following plan we discussed and let me know if I can assist you in the future.   These are the goals we discussed: Goals      Patient Stated   . Lose Weight (pt-stated)     Healthy diet       This is a list of the screening recommended for you and due dates:  Health Maintenance  Topic Date Due  . Tetanus Vaccine  05/27/1961  . Pneumonia vaccines (1 of 2 - PCV13) 05/28/2007  . Flu Shot  Completed  . DEXA scan (bone density measurement)  Completed

## 2018-08-20 DIAGNOSIS — M7061 Trochanteric bursitis, right hip: Secondary | ICD-10-CM | POA: Insufficient documentation

## 2018-09-03 ENCOUNTER — Other Ambulatory Visit: Payer: Self-pay

## 2018-09-03 ENCOUNTER — Telehealth: Payer: Self-pay | Admitting: Family

## 2018-09-03 NOTE — Telephone Encounter (Signed)
Copied from [place] [id number]. Topic: Quick Communication - Rx Refill/Question >> Sep 03, 2018  4:22 PM Blase Mess A wrote: Medication: alendronate (FOSAMAX) 70 MG tablet [[id number]]   Has the patient contacted their pharmacy? Yes  (Agent: If no, request that the patient contact the pharmacy for the refill.) (Agent: If yes, when and what did the pharmacy advise?)  Preferred Pharmacy (with phone number or street name): [id number] [place] [address] [place] [address] Phone: [phone] Fax: [phone]    Agent: Please be advised that RX refills may take up to 3 business days. We ask that you follow-up with your pharmacy.

## 2018-09-03 NOTE — Telephone Encounter (Signed)
Patient has not been seen since 07/2017 & no appointment scheduled. Ok to fill this?

## 2018-09-07 MED ORDER — ALENDRONATE SODIUM 70 MG PO TABS: 70.0000 mg | ORAL_TABLET | ORAL | 0 refills | Status: DC

## 2018-09-07 NOTE — Telephone Encounter (Signed)
Call pt  She needs f/u appt with me. She needs to be annually to have medications written.  She also needs f/u labs to stay on fosamax  I have given 3 months supply fosamax

## 2018-09-11 NOTE — Telephone Encounter (Signed)
Just FYI.

## 2018-09-11 NOTE — Telephone Encounter (Signed)
Pt is scheduled for 12/4 at 3pm.

## 2018-09-11 NOTE — Telephone Encounter (Signed)
Left voice mail for patient to call back ok for PEC to speak to patient , see below message    

## 2018-09-12 NOTE — Progress Notes (Deleted)
Subjective:    Patient ID: Claudia Andersen, female    DOB: [birth year], 77 y.o.   MRN: [id number]  CC: Claudia Andersen is a 77 y.o. female who presents today for follow up.   HPI: Osteoporosis - fosmax- how long? Last dexa 2018   HISTORY:  Past Medical History:  Diagnosis Date  . Arthritis   . Back abrasion    CRUSHED VERTEBRAE  . Depression   . GERD (gastroesophageal reflux disease)   . Migraines    Past Surgical History:  Procedure Laterality Date  . ABDOMINAL HYSTERECTOMY    . CATARACT EXTRACTION W/PHACO Right 03/27/2018   Procedure: CATARACT EXTRACTION PHACO AND INTRAOCULAR LENS PLACEMENT (IOC);  Surgeon: Birder Robson, MD;  Location: [facility] ORS;  Service: Ophthalmology;  Laterality: Right;  [place] 00:33 AP% 15.3 CDE 5.18 Fluid pack lot # [id number]  . CATARACT EXTRACTION W/PHACO Left 06/26/2018   Procedure: CATARACT EXTRACTION PHACO AND INTRAOCULAR LENS PLACEMENT ([place]);  Surgeon: Birder Robson, MD;  Location: [facility] ORS;  Service: Ophthalmology;  Laterality: Left;  [place] 00:38 AP% 15.2 CDE 5.80 Fluid pack lot # [id number]  . FRACTURE SURGERY     RIGHT RADIAL  . JOINT REPLACEMENT     LEFT TKR  . TRIPLE SUBTALAR FUSION     Family History  Problem Relation Age of Onset  . Arthritis Mother   . Heart disease Mother   . Stroke Mother   . Arthritis Father   . Heart disease Father   . Stroke Father   . Breast cancer Neg Hx   . Colon cancer Neg Hx     Allergies: Patient has no known allergies. Current Outpatient Medications on File Prior to Visit  Medication Sig Dispense Refill  . alendronate (FOSAMAX) 70 MG tablet Take 1 tablet (70 mg total) by mouth once a week. Take with a full glass of water on an empty stomach. 12 tablet 0  . aspirin EC 81 MG tablet Take 81 mg by mouth daily.    Marland Kitchen atorvastatin (LIPITOR) 10 MG tablet Take 10 mg by mouth daily.     . Calcium Carb-Cholecalciferol (CALCIUM 600 + D PO) Take 2 tablets by mouth daily.     . Fluticasone Furoate (FLONASE  SENSIMIST NA) Place 1 spray into both nostrils daily as needed (allergies).    . fluvoxaMINE (LUVOX) 100 MG tablet TAKE 0.5-1 TABLETS (50-100 MG TOTAL) DAILY BY MOUTH. 30 tablet 0  . naproxen sodium (ANAPROX) 220 MG tablet Take 220 mg by mouth daily as needed (pain).     . pantoprazole (PROTONIX) 40 MG tablet Take 1 tablet (40 mg total) by mouth 2 (two) times daily. 180 tablet 3  . traZODone (DESYREL) 50 MG tablet Take 50 mg by mouth at bedtime.      No current facility-administered medications on file prior to visit.     Social History   Tobacco Use  . Smoking status: Former Research scientist (life sciences)  . Smokeless tobacco: Never Used  Substance Use Topics  . Alcohol use: Yes    Comment: 5-7 glasses per week  . Drug use: Not on file    Review of Systems    Objective:    There were no vitals taken for this visit. BP Readings from Last 3 Encounters:  07/19/18 110/70  06/26/18 135/80  03/27/18 (!) 166/73   Wt Readings from Last 3 Encounters:  07/19/18 160 lb 1.9 oz (72.6 kg)  06/26/18 159 lb (72.1 kg)  03/20/18 159 lb (72.1 kg)  Physical Exam     Assessment & Plan:   Problem List Items Addressed This Visit    None       I am having Claudia Andersen "[place]" maintain her traZODone, atorvastatin, naproxen sodium, aspirin EC, pantoprazole, Calcium Carb-Cholecalciferol (CALCIUM 600 + D PO), Fluticasone Furoate (FLONASE SENSIMIST NA), fluvoxaMINE, and alendronate.   No orders of the defined types were placed in this encounter.   Return precautions given.   Risks, benefits, and alternatives of the medications and treatment plan prescribed today were discussed, and patient expressed understanding.   Education regarding symptom management and diagnosis given to patient on AVS.  Continue to follow with Claudia Hawthorne, FNP for routine health maintenance.   Claudia Andersen and I agreed with plan.   Claudia Paris, FNP

## 2018-09-12 NOTE — Telephone Encounter (Signed)
noted 

## 2018-09-19 ENCOUNTER — Ambulatory Visit: Payer: Medicare Other | Admitting: Family

## 2018-09-28 ENCOUNTER — Ambulatory Visit: Payer: Medicare Other | Admitting: Family

## 2018-10-24 ENCOUNTER — Telehealth: Payer: Self-pay

## 2018-10-24 NOTE — Telephone Encounter (Signed)
Copied from [place] [phone]. Topic: General - Other >> Oct 24, 2018 10:09 AM Jodie Echevaria wrote: Reason for CRM: Patient called said that she was returning Christina's call and that she was on her way. She then hung up

## 2018-10-26 ENCOUNTER — Encounter: Payer: Self-pay | Admitting: Internal Medicine

## 2018-10-26 ENCOUNTER — Ambulatory Visit (HOSPITAL_COMMUNITY)
Admission: RE | Admit: 2018-10-26 | Discharge: 2018-10-26 | Disposition: A | Discharge status: Home / Self Care | Payer: Medicare Other | Source: Ambulatory Visit | Attending: Internal Medicine | Admitting: Internal Medicine

## 2018-10-26 ENCOUNTER — Ambulatory Visit (HOSPITAL_COMMUNITY): Payer: Medicare Other

## 2018-10-26 ENCOUNTER — Ambulatory Visit: Admission: RE | Admit: 2018-10-26 | Payer: Medicare Other | Source: Ambulatory Visit

## 2018-10-26 ENCOUNTER — Ambulatory Visit: Payer: Medicare Other | Admitting: Internal Medicine

## 2018-10-26 VITALS — BP 118/82 | HR 84 | Temp 98.1°F | Ht 62.0 in

## 2018-10-26 DIAGNOSIS — R600 Localized edema: Secondary | ICD-10-CM | POA: Diagnosis not present

## 2018-10-26 DIAGNOSIS — Z86711 Personal history of pulmonary embolism: Secondary | ICD-10-CM | POA: Insufficient documentation

## 2018-10-26 DIAGNOSIS — R269 Unspecified abnormalities of gait and mobility: Secondary | ICD-10-CM

## 2018-10-26 DIAGNOSIS — S82841A Displaced bimalleolar fracture of right lower leg, initial encounter for closed fracture: Secondary | ICD-10-CM

## 2018-10-26 DIAGNOSIS — E559 Vitamin D deficiency, unspecified: Secondary | ICD-10-CM

## 2018-10-26 DIAGNOSIS — R937 Abnormal findings on diagnostic imaging of other parts of musculoskeletal system: Secondary | ICD-10-CM

## 2018-10-26 DIAGNOSIS — M1990 Unspecified osteoarthritis, unspecified site: Secondary | ICD-10-CM

## 2018-10-26 DIAGNOSIS — Z86718 Personal history of other venous thrombosis and embolism: Secondary | ICD-10-CM | POA: Insufficient documentation

## 2018-10-26 DIAGNOSIS — W19XXXA Unspecified fall, initial encounter: Secondary | ICD-10-CM

## 2018-10-26 DIAGNOSIS — M5137 Other intervertebral disc degeneration, lumbosacral region: Secondary | ICD-10-CM | POA: Insufficient documentation

## 2018-10-26 DIAGNOSIS — M5136 Other intervertebral disc degeneration, lumbar region: Secondary | ICD-10-CM

## 2018-10-26 DIAGNOSIS — M5418 Radiculopathy, sacral and sacrococcygeal region: Secondary | ICD-10-CM

## 2018-10-26 DIAGNOSIS — M810 Age-related osteoporosis without current pathological fracture: Secondary | ICD-10-CM

## 2018-10-26 DIAGNOSIS — M2550 Pain in unspecified joint: Secondary | ICD-10-CM | POA: Insufficient documentation

## 2018-10-26 DIAGNOSIS — I6523 Occlusion and stenosis of bilateral carotid arteries: Secondary | ICD-10-CM

## 2018-10-26 DIAGNOSIS — M7918 Myalgia, other site: Secondary | ICD-10-CM

## 2018-10-26 DIAGNOSIS — Z1329 Encounter for screening for other suspected endocrine disorder: Secondary | ICD-10-CM

## 2018-10-26 DIAGNOSIS — M5416 Radiculopathy, lumbar region: Secondary | ICD-10-CM

## 2018-10-26 DIAGNOSIS — I6529 Occlusion and stenosis of unspecified carotid artery: Secondary | ICD-10-CM | POA: Insufficient documentation

## 2018-10-26 DIAGNOSIS — Z0000 Encounter for general adult medical examination without abnormal findings: Secondary | ICD-10-CM

## 2018-10-26 DIAGNOSIS — S5291XA Unspecified fracture of right forearm, initial encounter for closed fracture: Secondary | ICD-10-CM

## 2018-10-26 DIAGNOSIS — Z1389 Encounter for screening for other disorder: Secondary | ICD-10-CM

## 2018-10-26 DIAGNOSIS — R935 Abnormal findings on diagnostic imaging of other abdominal regions, including retroperitoneum: Secondary | ICD-10-CM

## 2018-10-26 HISTORY — DX: Unspecified fracture of right forearm, initial encounter for closed fracture: S52.91XA

## 2018-10-26 HISTORY — DX: Displaced bimalleolar fracture of right lower leg, initial encounter for closed fracture: S82.841A

## 2018-10-26 MED ORDER — GABAPENTIN 300 MG PO CAPS: 300.0000 mg | ORAL_CAPSULE | Freq: Every day | ORAL | 3 refills | Status: DC

## 2018-10-26 NOTE — Addendum Note (Signed)
Addended by: Orland Mustard on: 10/26/2018 10:10 PM   Modules accepted: Orders

## 2018-10-26 NOTE — Patient Instructions (Signed)
Edema  Edema is an abnormal buildup of fluids in the body tissues and under the skin. Swelling of the legs, feet, and ankles is a common symptom that becomes more likely as you get older. Swelling is also common in looser tissues, like around the eyes. When the affected area is squeezed, the fluid may move out of that spot and leave a dent for a few moments. This dent is called pitting edema. There are many possible causes of edema. Eating too much salt (sodium) and being on your feet or sitting for a long time can cause edema in your legs, feet, and ankles. Hot weather may make edema worse. Common causes of edema include:  Heart failure.  Liver or kidney disease.  Weak leg blood vessels.  Cancer.  An injury.  Pregnancy.  Medicines.  Being obese.  Low protein levels in the blood. Edema is usually painless. Your skin may look swollen or shiny. Follow these instructions at home:  Keep the affected body part raised (elevated) above the level of your heart when you are sitting or lying down.  Do not sit still or stand for long periods of time.  Do not wear tight clothing. Do not wear garters on your upper legs.  Exercise your legs to get your circulation going. This helps to move the fluid back into your blood vessels, and it may help the swelling go down.  Wear elastic bandages or support stockings to reduce swelling as told by your health care provider.  Eat a low-salt (low-sodium) diet to reduce fluid as told by your health care provider.  Depending on the cause of your swelling, you may need to limit how much fluid you drink (fluid restriction).  Take over-the-counter and prescription medicines only as told by your health care provider. Contact a health care provider if:  Your edema does not get better with treatment.  You have heart, liver, or kidney disease and have symptoms of edema.  You have sudden and unexplained weight gain. Get help right away if:  You develop  shortness of breath or chest pain.  You cannot breathe when you lie down.  You develop pain, redness, or warmth in the swollen areas.  You have heart, liver, or kidney disease and suddenly get edema.  You have a fever and your symptoms suddenly get worse. Summary  Edema is an abnormal buildup of fluids in the body tissues and under the skin.  Eating too much salt (sodium) and being on your feet or sitting for a long time can cause edema in your legs, feet, and ankles.  Keep the affected body part raised (elevated) above the level of your heart when you are sitting or lying down. This information is not intended to replace advice given to you by your health care provider. Make sure you discuss any questions you have with your health care provider. Document Released: 10/03/2005 Document Revised: 11/05/2016 Document Reviewed: 11/05/2016 Elsevier Interactive Patient Education  2019 [employer]  Lumbosacral Radiculopathy Lumbosacral radiculopathy is a condition that involves the spinal nerves and nerve roots in the low back and bottom of the spine. The condition develops when these nerves and nerve roots move out of place or become inflamed and cause symptoms. What are the causes? This condition may be caused by:  Pressure from a disk that bulges out of place (herniated disk). A disk is a plate of soft cartilage that separates bones in the spine.  Disk changes that occur with age (disk degeneration).  A narrowing of the bones of the lower back (spinal stenosis).  A tumor.  An infection.  An injury that places sudden pressure on the disks that cushion the bones of your lower spine. What increases the risk? You are more likely to develop this condition if:  You are a female who is 74-18 years old.  You are a female who is 70-22 years old.  You use improper technique when lifting things.  You are overweight or live a sedentary lifestyle.  Your work requires frequent  lifting.  You smoke.  You do repetitive activities that strain the spine. What are the signs or symptoms? Symptoms of this condition include:  Pain that goes down from your back into your legs (sciatica), usually on one side of the body. This is the most common symptom. The pain may be worse with sitting, coughing, or sneezing.  Pain and numbness in your legs.  Muscle weakness.  Tingling.  Loss of bladder control or bowel control. How is this diagnosed? This condition may be diagnosed based on:  Your symptoms and medical history.  A physical exam. If the pain is lasting, you may have tests, such as:  MRI scan.  X-ray.  CT scan.  A type of X-ray used to examine the spinal canal after injecting a dye into your spine (myelogram).  A test to measure how electrical impulses move through a nerve (nerve conduction study). How is this treated? Treatment may depend on the cause of the condition and may include:  Working with a physical therapist.  Taking pain medicine.  Applying heat and ice to affected areas.  Doing stretches to improve flexibility.  Doing exercises to strengthen back muscles.  Having chiropractic spinal manipulation.  Using transcutaneous electrical nerve stimulation (TENS) therapy.  Getting a steroid injection in the spine. In some cases, no treatment is needed. If the condition is long-lasting (chronic), or if symptoms are severe, treatment may involve surgery or lifestyle changes, such as following a weight-loss plan. Follow these instructions at home: Activity  Avoid bending and other activities that make the problem worse.  Maintain a proper position when standing or sitting: ? When standing, keep your upper back and neck straight, with your shoulders pulled back. Avoid slouching. ? When sitting, keep your back straight and relax your shoulders. Do not round your shoulders or pull them backward.  Do not sit or stand in one place for long  periods of time.  Take brief periods of rest throughout the day. This will reduce your pain. It is usually better to rest by lying down or standing, not sitting.  When you are resting for longer periods, mix in some mild activity or stretching between periods of rest. This will help to prevent stiffness and pain.  Get regular exercise. Ask your health care provider what activities are safe for you. If you were shown how to do any exercises or stretches, do them as directed by your health care provider.  Do not lift anything that is heavier than 10 lb (4.5 kg) or the limit that you are told by your health care provider. Always use proper lifting technique, which includes: ? Bending your knees. ? Keeping the load close to your body. ? Avoiding twisting. Managing pain  If directed, put ice on the affected area: ? Put ice in a plastic bag. ? Place a towel between your skin and the bag. ? Leave the ice on for 20 minutes, 2-3 times a day.  If directed,  apply heat to the affected area as often as told by your health care provider. Use the heat source that your health care provider recommends, such as a moist heat pack or a heating pad. ? Place a towel between your skin and the heat source. ? Leave the heat on for 20-30 minutes. ? Remove the heat if your skin turns bright red. This is especially important if you are unable to feel pain, heat, or cold. You may have a greater risk of getting burned.  Take over-the-counter and prescription medicines only as told by your health care provider. General instructions  Sleep on a firm mattress in a comfortable position. Try lying on your side with your knees slightly bent. If you lie on your back, put a pillow under your knees.  Do not drive or use heavy machinery while taking prescription pain medicine.  If your health care provider prescribed a diet or exercise program, follow it as directed.  Keep all follow-up visits as told by your health care  provider. This is important. Contact a health care provider if:  Your pain does not improve over time, even when taking pain medicines. Get help right away if:  You develop severe pain.  Your pain suddenly gets worse.  You develop increasing weakness in your legs.  You lose the ability to control your bladder or bowel.  You have difficulty walking or balancing.  You have a fever. Summary  Lumbosacral radiculopathy is a condition that occurs when the spinal nerves and nerve roots in the lower part of the spine move out of place or become inflamed and cause symptoms.  Symptoms include pain, numbness, and tingling that go down from your back into your legs (sciatica), muscle weakness, and loss of bladder control or bowel control.  If directed, apply ice or heat to the affected area as told by your health care provider.  Follow instructions about activity, rest, and proper lifting technique. This information is not intended to replace advice given to you by your health care provider. Make sure you discuss any questions you have with your health care provider. Document Released: 10/03/2005 Document Revised: 09/21/2017 Document Reviewed: 09/21/2017 Elsevier Interactive Patient Education  2019 [employer]  Sciatica  Sciatica is pain, numbness, weakness, or tingling along the path of the sciatic nerve. The sciatic nerve starts in the lower back and runs down the back of each leg. The nerve controls the muscles in the lower leg and in the back of the knee. It also provides feeling (sensation) to the back of the thigh, the lower leg, and the sole of the foot. Sciatica is a symptom of another medical condition that pinches or puts pressure on the sciatic nerve. Generally, sciatica only affects one side of the body. Sciatica usually goes away on its own or with treatment. In some cases, sciatica may keep coming back (recur). What are the causes? This condition is caused by pressure on the  sciatic nerve, or pinching of the sciatic nerve. This may be the result of:  A disk in between the bones of the spine (vertebrae) bulging out too far (herniated disk).  Age-related changes in the spinal disks (degenerative disk disease).  A pain disorder that affects a muscle in the buttock (piriformis syndrome).  Extra bone growth (bone spur) near the sciatic nerve.  An injury or break (fracture) of the pelvis.  Pregnancy.  Tumor (rare). What increases the risk? The following factors may make you more likely to develop this  condition:  Playing sports that place pressure or stress on the spine, such as football or weight lifting.  Having poor strength and flexibility.  A history of back injury.  A history of back surgery.  Sitting for long periods of time.  Doing activities that involve repetitive bending or lifting.  Obesity. What are the signs or symptoms? Symptoms can vary from mild to very severe, and they may include:  Any of these problems in the lower back, leg, hip, or buttock: ? Mild tingling or dull aches. ? Burning sensations. ? Sharp pains.  Numbness in the back of the calf or the sole of the foot.  Leg weakness.  Severe back pain that makes movement difficult. These symptoms may get worse when you cough, sneeze, or laugh, or when you sit or stand for long periods of time. Being overweight may also make symptoms worse. In some cases, symptoms may recur over time. How is this diagnosed? This condition may be diagnosed based on:  Your symptoms.  A physical exam. Your health care provider may ask you to do certain movements to check whether those movements trigger your symptoms.  You may have tests, including: ? Blood tests. ? X-rays. ? MRI. ? CT scan. How is this treated? In many cases, this condition improves on its own, without any treatment. However, treatment may include:  Reducing or modifying physical activity during periods of  pain.  Exercising and stretching to strengthen your abdomen and improve the flexibility of your spine.  Icing and applying heat to the affected area.  Medicines that help: ? To relieve pain and swelling. ? To relax your muscles.  Injections of medicines that help to relieve pain, irritation, and inflammation around the sciatic nerve (steroids).  Surgery. Follow these instructions at home: Medicines  Take over-the-counter and prescription medicines only as told by your health care provider.  Do not drive or operate heavy machinery while taking prescription pain medicine. Managing pain  If directed, apply ice to the affected area. ? Put ice in a plastic bag. ? Place a towel between your skin and the bag. ? Leave the ice on for 20 minutes, 2-3 times a day.  After icing, apply heat to the affected area before you exercise or as often as told by your health care provider. Use the heat source that your health care provider recommends, such as a moist heat pack or a heating pad. ? Place a towel between your skin and the heat source. ? Leave the heat on for 20-30 minutes. ? Remove the heat if your skin turns bright red. This is especially important if you are unable to feel pain, heat, or cold. You may have a greater risk of getting burned. Activity  Return to your normal activities as told by your health care provider. Ask your health care provider what activities are safe for you. ? Avoid activities that make your symptoms worse.  Take brief periods of rest throughout the day. Resting in a lying or standing position is usually better than sitting to rest. ? When you rest for longer periods, mix in some mild activity or stretching between periods of rest. This will help to prevent stiffness and pain. ? Avoid sitting for long periods of time without moving. Get up and move around at least one time each hour.  Exercise and stretch regularly, as told by your health care provider.  Do  not lift anything that is heavier than 10 lb (4.5 kg) while you have  symptoms of sciatica. When you do not have symptoms, you should still avoid heavy lifting, especially repetitive heavy lifting.  When you lift objects, always use proper lifting technique, which includes: ? Bending your knees. ? Keeping the load close to your body. ? Avoiding twisting. General instructions  Use good posture. ? Avoid leaning forward while sitting. ? Avoid hunching over while standing.  Maintain a healthy weight. Excess weight puts extra stress on your back and makes it difficult to maintain good posture.  Wear supportive, comfortable shoes. Avoid wearing high heels.  Avoid sleeping on a mattress that is too soft or too hard. A mattress that is firm enough to support your back when you sleep may help to reduce your pain.  Keep all follow-up visits as told by your health care provider. This is important. Contact a health care provider if:  You have pain that wakes you up when you are sleeping.  You have pain that gets worse when you lie down.  Your pain is worse than you have experienced in the past.  Your pain lasts longer than 4 weeks.  You experience unexplained weight loss. Get help right away if:  You lose control of your bowel or bladder (incontinence).  You have: ? Weakness in your lower back, pelvis, buttocks, or legs that gets worse. ? Redness or swelling of your back. ? A burning sensation when you urinate. This information is not intended to replace advice given to you by your health care provider. Make sure you discuss any questions you have with your health care provider. Document Released: 09/27/2001 Document Revised: 03/08/2016 Document Reviewed: 06/12/2015 Elsevier Interactive Patient Education  2019 [employer].

## 2018-10-26 NOTE — Progress Notes (Signed)
Pre visit review using our clinic review tool, if applicable. No additional management support is needed unless otherwise documented below in the visit note. 

## 2018-10-26 NOTE — Progress Notes (Addendum)
Chief Complaint  Patient presents with  . Hip Pain    Across buttocks  . Leg Pain   Acute visit  1. Fall 06/2018 and has right hip pain 11/26/2018 with Dr. Harlow Mares [facility]  2. X 3 weeks reason for todays visit c/o b/l buttocks pain L>R it feels like a cramp in her buttocks. She is in a wheelchair today due to she can barely walk due to buttock pain and has been using and electric wheelchair and a walker. No falls since 06/2018. Pain radiates on the left down her leg to left knee and she is having leg weakness and trouble getting up and changing positions. Pain is 6/10 today. No bowel or bladder incontinence or saddle anesthesia but she does have trouble walking. She has tramadol and hydroco 3. 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 Leg edema w/in the last 2-3 weeks w/o pain this is new it has improved but was worse. Right leg is worse than left esp in ankle area.   Review of Systems  HENT: Negative for hearing loss.   Eyes: Negative for blurred vision.  Respiratory: Negative for shortness of breath.   Cardiovascular: Positive for leg swelling.  Musculoskeletal: Positive for back pain, falls and joint pain.  Skin: Negative for rash.  Neurological: Negative for headaches.  Psychiatric/Behavioral: Negative for memory loss.   Past Medical History:  Diagnosis Date  . Arthritis   . Back abrasion    CRUSHED VERTEBRAE  . Depression   . GERD (gastroesophageal reflux disease)   . Migraines    Past Surgical History:  Procedure Laterality Date  . ABDOMINAL HYSTERECTOMY    . CATARACT EXTRACTION W/PHACO Right 03/27/2018   Procedure: CATARACT EXTRACTION PHACO AND INTRAOCULAR LENS PLACEMENT (IOC);  Surgeon: Birder Robson, MD;  Location: [facility] ORS;  Service: Ophthalmology;  Laterality: Right;  [place] 00:33 AP% 15.3 CDE 5.18 Fluid pack lot # [id number]  . CATARACT EXTRACTION W/PHACO  Left 06/26/2018   Procedure: CATARACT EXTRACTION PHACO AND INTRAOCULAR LENS PLACEMENT ([place]);  Surgeon: Birder Robson, MD;  Location: [facility] ORS;  Service: Ophthalmology;  Laterality: Left;  [place] 00:38 AP% 15.2 CDE 5.80 Fluid pack lot # [id number]  . FRACTURE SURGERY     RIGHT RADIAL  . JOINT REPLACEMENT     LEFT TKR  . TRIPLE SUBTALAR FUSION     Family History  Problem Relation Age of Onset  . Arthritis Mother   . Heart disease Mother   . Stroke Mother   . Arthritis Father   . Heart disease Father   . Stroke Father   . Breast cancer Neg Hx   . Colon cancer Neg Hx    Social History   Socioeconomic History  . Marital status: Divorced    Spouse name: Not on file  . Number of children: Not on file  . Years of education: Not on file  . Highest education level: Not on file  Occupational History  . Not on file  Social Needs  . Financial resource strain: Not hard at all  . Food insecurity:    Worry: Never true    Inability: Never true  . Transportation needs:    Medical: No    Non-medical: No  Tobacco Use  . Smoking status: Former Research scientist (life sciences)  . Smokeless tobacco: Never Used  Substance and Sexual Activity  . Alcohol use: Yes    Comment: 5-7 glasses per  week  . Drug use: Not on file  . Sexual activity: Not on file  Lifestyle  . Physical activity:    Days per week: 7 days    Minutes per session: 60 min  . Stress: Not at all  Relationships  . Social connections:    Talks on phone: Not on file    Gets together: Not on file    Attends religious service: Not on file    Active member of club or organization: Not on file    Attends meetings of clubs or organizations: Not on file    Relationship status: Not on file  . Intimate partner violence:    Fear of current or ex partner: Not on file    Emotionally abused: Not on file    Physically abused: Not on file    Forced sexual activity: Not on file  Other Topics Concern  . Not on file  Social History Narrative   Retired Engineer, structural      From [place].    Current Meds  Medication Sig  . alendronate (FOSAMAX) 70 MG tablet Take 1 tablet (70 mg total) by mouth once a week. Take with a full glass of water on an empty stomach.  Marland Kitchen aspirin EC 81 MG tablet Take 81 mg by mouth daily.  Marland Kitchen atorvastatin (LIPITOR) 10 MG tablet Take 10 mg by mouth daily.   . Calcium Carb-Cholecalciferol (CALCIUM 600 + D PO) Take 2 tablets by mouth daily.   . Fluticasone Furoate (FLONASE SENSIMIST NA) Place 1 spray into both nostrils daily as needed (allergies).  . fluvoxaMINE (LUVOX) 100 MG tablet TAKE 0.5-1 TABLETS (50-100 MG TOTAL) DAILY BY MOUTH.  . naproxen sodium (ANAPROX) 220 MG tablet Take 220 mg by mouth daily as needed (pain).   . pantoprazole (PROTONIX) 40 MG tablet Take 1 tablet (40 mg total) by mouth 2 (two) times daily.  . traZODone (DESYREL) 50 MG tablet Take 50 mg by mouth at bedtime.    No Known Allergies No results found for this or any previous visit (from the past 2160 hour(s)). Objective  Body mass index is 29.29 kg/m. Wt Readings from Last 3 Encounters:  07/19/18 160 lb 1.9 oz (72.6 kg)  06/26/18 159 lb (72.1 kg)  03/20/18 159 lb (72.1 kg)   Temp Readings from Last 3 Encounters:  10/26/18 98.1 F (36.7 C) (Oral)  07/19/18 98.1 F (36.7 C) (Oral)  06/26/18 (!) 97 F (36.1 C) (Temporal)   BP Readings from Last 3 Encounters:  10/26/18 118/82  07/19/18 110/70  06/26/18 135/80   Pulse Readings from Last 3 Encounters:  10/26/18 84  07/19/18 66  06/26/18 (!) 59    Physical Exam Vitals signs and nursing note reviewed.  Constitutional:      Appearance: Normal appearance. She is well-developed and overweight.  HENT:     Head: Normocephalic and atraumatic.     Nose: Nose normal.     Mouth/Throat:     Mouth: Mucous membranes are moist.     Pharynx: Oropharynx is clear.  Eyes:     Conjunctiva/sclera: Conjunctivae normal.     Pupils: Pupils are equal, round, and reactive to light.  Cardiovascular:     Rate  and Rhythm: Normal rate and regular rhythm.     Heart sounds: Normal heart sounds. No murmur.     Comments: 2+ leg edema b/l   Pulmonary:     Effort: Pulmonary effort is normal.     Breath sounds: Normal breath  sounds.  Musculoskeletal:       Arms:     Comments: Right hip pain with straight leg test otherwise neg  Neg left str8 leg test    Skin:    General: Skin is warm and dry.  Neurological:     General: No focal deficit present.     Mental Status: She is alert and oriented to person, place, and time.     Gait: Gait abnormal.     Comments: In wheelchair today  Gait slow and unsteady with cane    Psychiatric:        Attention and Perception: Attention and perception normal.        Mood and Affect: Mood and affect normal.        Speech: Speech normal.        Behavior: Behavior normal. Behavior is cooperative.        Thought Content: Thought content normal.        Cognition and Memory: Cognition and memory normal.        Judgment: Judgment normal.     Assessment   1. B/l buttocks pain L>R ddx lumbar radiculopathy, ischial bursitis, atherosclerosis all in ddx. No evidence of shingles likely MSK with abnormal gait and leg weakness and abnormal L spine Xray 2016  SI joint  Xray 06/19/03 mild deg changes hips and SI jts severe DDD/spondylosis and lumbar spine  MRI 02/14/03 DDD L5-S1 with disc bulge w/o significant neural foraminal narrowing or cental spinal canal stenosis  10/28/99 C spine marked degenerative changes of facet jts in lower cervical spine   disc'ed MRI with patient tonight at 10 pm and with Dr. Lisette Grinder on call  Will refer patient to PM&R Dr. Cristy Folks [facility] and or Dr. Joselyn Arrow [facility]  Also will fax all MRIS and note to Dr. Kurtis Bushman [facility]   2. Leg edema b/l 2+ R>L r/o DVT she does have h/o PE 10/03/12 will need to r/o DVT, h/o acute DVT right leg 07/15/12  3. Fall 06/2018 pending right hip surgery 11/26/2018 [facility] Dr. Harlow Mares 4. Osteoporosis  with L1/4 compression fractures 5. HM Plan   1. MRI lumbar and sacrum/pelvis today at [facility] in [place]  Add gabapentin 300 mg qhs  She has tramadol and hydrocodone ? Doses she is getting outside provider can take q6 hrs prn 2. Stat US legs today [facility] Check labs  CMET, CBC, vitamin D, TSH, UA today [facility] If normal consider echo  -echo 04/07/15  Trivial regurgitation EF 16% mild diastolic dysfunction, mild left atrial enlargement   Hold lasix for now due to trouble going to bathroom  rec elevation  Hard to get compression stockings on but has them   Also consider US abdomen in future  Reviewed [place]  04/26/17   FINDINGS:   LIVER: The liver is mildly heterogenous in echogenicity with a smooth contour. No focal hepatic lesions. No intrahepatic biliary ductal dilatation. The common bile duct is normal in caliber.   GALLBLADDER: The gallbladder is physiologically distended with multiple mobile stones. Sonographic Percell Miller sign is negative.  No pericholecystic fluid. No gallbladder wall thickening.  PANCREAS: Demonstrates increased echogenicity consistent with diffuse fatty infiltration. The pancreatic duct was within normal limits measuring up to 2.0 mm.  SPLEEN: Normal in size and echotexture.  KIDNEYS: Bilateral kidneys demonstrate lobulated contour with cortical thinning and normal size and echotexture. No solid masses or calculi. No hydronephrosis.  VESSELS: Visualized proximal aorta and IVC are unremarkable.   OTHER:  No ascites.   IMPRESSION: --Cholelithiasis without cholecystitis. --Mildly heterogenous liver with no focal lesions identified. Please note that MRI is more sensitive in the evaluation of a heterogenous liver for focal masses.  Please see below for data measurements:  Liver: 12.4 cm   Common hepatic duct: 0.29 cm Proximal common bile duct: 0.50 cm Distal common bile duct: 0.45 cm  Gallbladder wall: 1.7 mm   Right kidney: 9.1 cm  Left kidney: 10.8 cm   Aorta:  partially visualized Inferior vena cava: partially visualized  Spleen: 8.4 cm  3.  Pending surgery  4. On fosamax  Consider repeat DEXA 06/2019  5.  Labs today not fasting  Consider lipid in future   Flu shot utd  prevnar utd  Consider pna 23  Consider Tdap  Consider shingrix   Out of age window pap  mammo neg 01/31/18  dexa 06/26/2017 osteoporosis with compression fractures L1/4, h/o left 10th rib fracture, left humerus fracture 03/29/99 - consider prolia if fosamax does not improve dexa likely need dexa by 06/2019  -ordered repeat DEXA stat as she will have right total hip replacement 11/26/2018 and has bee non fosamax x 5 years still with compression fractures 3-5 years is max time for oral bisphosphonates  -likely needs change in therapy Osteoporosis medications   Colonoscopy 12/19/11 erosion and ulceration neg bx care everywhere 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 Olivia Mackie [facility]

## 2018-10-27 LAB — URINALYSIS, ROUTINE W REFLEX MICROSCOPIC
Bilirubin, UA: NEGATIVE
Glucose, UA: NEGATIVE
Ketones, UA: NEGATIVE
Leukocytes, UA: NEGATIVE
Nitrite, UA: NEGATIVE
Protein, UA: NEGATIVE
RBC, UA: NEGATIVE
SPEC GRAV UA: 1.018 (ref 1.005–1.030)
Urobilinogen, Ur: 0.2 mg/dL (ref 0.2–1.0)
pH, UA: 6 (ref 5.0–7.5)

## 2018-10-29 ENCOUNTER — Telehealth: Payer: Self-pay | Admitting: Family

## 2018-10-29 ENCOUNTER — Ambulatory Visit
Admission: RE | Admit: 2018-10-29 | Discharge: 2018-10-29 | Disposition: A | Discharge status: Home / Self Care | Payer: Medicare Other | Source: Ambulatory Visit | Attending: Internal Medicine | Admitting: Internal Medicine

## 2018-10-29 DIAGNOSIS — R600 Localized edema: Secondary | ICD-10-CM | POA: Diagnosis present

## 2018-10-29 NOTE — Telephone Encounter (Signed)
Claudia Andersen, ultrasound technician at [facility] called to report that the pt negative for DVT, read by Dr Sandi Mariscal; results repeated for correctness; notified Fransisco Beau; will also route to office for notification; pt seen by Dr Karlyn Agee -Jacklynn Lewis, [place], 10/26/2018.

## 2018-10-31 ENCOUNTER — Encounter: Payer: Self-pay | Admitting: Internal Medicine

## 2018-10-31 ENCOUNTER — Other Ambulatory Visit: Payer: Self-pay | Admitting: Internal Medicine

## 2018-10-31 DIAGNOSIS — R600 Localized edema: Secondary | ICD-10-CM

## 2018-10-31 NOTE — Progress Notes (Signed)
MRI Notes have been faxed

## 2018-10-31 NOTE — Telephone Encounter (Signed)
Call pt Please also schedule bone density for patient.  Does not appear scheduled at this time She has been seen recently by Dr. Aundra Dubin.  I know she is a lot going on and will be seeing [facility] hopefully next week. Has then been scheduled?    Please advise her to make a follow appointment with me as well.

## 2018-11-02 ENCOUNTER — Other Ambulatory Visit: Payer: Self-pay | Admitting: Family

## 2018-11-02 NOTE — Telephone Encounter (Signed)
Requested medication (s) are due for refill today: not specified  Requested medication (s) are on the active medication list: yes    Last refill: 07/31/17  Future visit scheduled no  Notes to clinic:Historical provider  Requested Prescriptions  Pending Prescriptions Disp Refills   atorvastatin (LIPITOR) 10 MG tablet      Sig: Take 1 tablet (10 mg total) by mouth daily.     Cardiovascular:  Antilipid - Statins Failed - 11/02/2018  4:22 PM      Failed - Total Cholesterol in normal range and within 360 days    No results found for: CHOL, POCCHOL       Failed - LDL in normal range and within 360 days    No results found for: LDLCALC, LDLC, HIRISKLDL       Failed - HDL in normal range and within 360 days    No results found for: HDL       Failed - Triglycerides in normal range and within 360 days    No results found for: TRIG       Passed - Patient is not pregnant      Passed - Valid encounter within last 12 months    Recent Outpatient Visits          1 week ago Gluteal pain   [facility]-Scoc[facility]za, Nino Glow, MD   1 year ago Screen for colon cancer   [facility] [place], Yvetta Coder, FNP      Future Appointments            In 8 months O'Brien-Blaney, [place], LPN [place], [place]

## 2018-11-02 NOTE — Telephone Encounter (Signed)
Copied from [place] [id number]. Topic: Quick Communication - Rx Refill/Question >> Nov 02, 2018  4:12 PM Leward Quan A wrote: Medication: atorvastatin (LIPITOR) 10 MG tablet   Has the patient contacted their pharmacy? Yes.   (Agent: If no, request that the patient contact the pharmacy for the refill.) (Agent: If yes, when and what did the pharmacy advise?)  Preferred Pharmacy (with phone number or street name): [place], [place] [phone] (Phone) [phone] (Fax)      Agent: Please be advised that RX refills may take up to 3 business days. We ask that you follow-up with your pharmacy.

## 2018-11-03 ENCOUNTER — Other Ambulatory Visit: Payer: Self-pay | Admitting: Family

## 2018-11-05 MED ORDER — ATORVASTATIN CALCIUM 10 MG PO TABS: 10.0000 mg | ORAL_TABLET | Freq: Every day | ORAL | 1 refills | Status: DC

## 2018-11-08 ENCOUNTER — Encounter: Payer: Self-pay | Admitting: Internal Medicine

## 2018-11-09 ENCOUNTER — Telehealth: Payer: Self-pay | Admitting: *Deleted

## 2018-11-09 NOTE — Telephone Encounter (Signed)
prolia 

## 2018-11-12 ENCOUNTER — Other Ambulatory Visit: Payer: Self-pay | Admitting: Family

## 2018-11-12 ENCOUNTER — Ambulatory Visit: Payer: Medicare Other | Admitting: Family

## 2018-11-12 ENCOUNTER — Telehealth: Payer: Self-pay

## 2018-11-12 NOTE — Telephone Encounter (Signed)
Copied from [place] [id number]. Topic: General - Other >> Nov 12, 2018  8:05 AM Carolyn Stare wrote:  Pt daughter Dan Maker called and said they are planning a trip to [place] and has some questions about her Mom going on this trip and is asking if Joycelyn Schmid will call her. Daughter does not want Mom to know that she has reached out to [facility]

## 2018-11-12 NOTE — Telephone Encounter (Signed)
Spoke with daughter on [place].   Waiting on right hip replacement until pelvic fractures.  Currently in PT.   Patient did not come for appointment today  Planning trip to [place] 03/2019 with daughter and daughter is worried about this.    Advised that we need have an OV to discuss . Advised to that she needs labs, rou tine f/u and her bone density to be scheduled ( gave number for [place] to schedule).   She will discuss with mother and advise f/u appt with me.

## 2018-11-12 NOTE — Progress Notes (Deleted)
Subjective:    Patient ID: Claudia Andersen, female    DOB: 1941-06-02, 78 y.o.   MRN: [id number]  CC: Claudia Andersen is a 78 y.o. female who presents today for follow up.   HPI: Who is on DPR list.   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  reclast with endocrine prolia injections in our office [place]- plan  GERD- protonix Seen Dr Aundra Dubin 10/26/2018 for gluteal pain MRI pelvis showed nondisplaced fracture of the left sacral alla, nondisplaced fractures acetabulum, probable of s3, ganglion cyst MR lumbar spine shows severe L1 compression fracture.    HISTORY:  Past Medical History:  Diagnosis Date  . Arthritis   . Back abrasion    CRUSHED VERTEBRAE  . Depression   . Gallstones   . GERD (gastroesophageal reflux disease)   . Migraines    Past Surgical History:  Procedure Laterality Date  . ABDOMINAL HYSTERECTOMY    . CATARACT EXTRACTION W/PHACO Right 03/27/2018   Procedure: CATARACT EXTRACTION PHACO AND INTRAOCULAR LENS PLACEMENT (IOC);  Surgeon: Birder Robson, MD;  Location: [facility] ORS;  Service: Ophthalmology;  Laterality: Right;  [place] 00:33 AP% 15.3 CDE 5.18 Fluid pack lot # [id number]  . CATARACT EXTRACTION W/PHACO Left 06/26/2018   Procedure: CATARACT EXTRACTION PHACO AND INTRAOCULAR LENS PLACEMENT ([place]);  Surgeon: Birder Robson, MD;  Location: [facility] ORS;  Service: Ophthalmology;  Laterality: Left;  [place] 00:38 AP% 15.2 CDE 5.80 Fluid pack lot # [id number]  . FRACTURE SURGERY     RIGHT RADIAL  . JOINT REPLACEMENT     LEFT TKR  . TRIPLE SUBTALAR FUSION     Family History  Problem Relation Age of Onset  . Arthritis Mother   . Heart disease Mother   . Stroke Mother   . Arthritis Father   . Heart disease Father   . Stroke Father   . Breast cancer Neg Hx   . Colon cancer Neg Hx     Allergies: Patient has no known  allergies. Current Outpatient Medications on File Prior to Visit  Medication Sig Dispense Refill  . alendronate (FOSAMAX) 70 MG tablet Take 1 tablet (70 mg total) by mouth once a week. Take with a full glass of water on an empty stomach. 12 tablet 0  . aspirin EC 81 MG tablet Take 81 mg by mouth daily.    Marland Kitchen atorvastatin (LIPITOR) 10 MG tablet Take 1 tablet (10 mg total) by mouth daily. 90 tablet 1  . Calcium Carb-Cholecalciferol (CALCIUM 600 + D PO) Take 2 tablets by mouth daily.     . Fluticasone Furoate (FLONASE SENSIMIST NA) Place 1 spray into both nostrils daily as needed (allergies).    . fluvoxaMINE (LUVOX) 100 MG tablet TAKE 0.5-1 TABLETS (50-100 MG TOTAL) DAILY BY MOUTH. 90 tablet 1  . gabapentin (NEURONTIN) 300 MG capsule Take 1 capsule (300 mg total) by mouth at bedtime. 90 capsule 3  . naproxen sodium (ANAPROX) 220 MG tablet Take 220 mg by mouth daily as needed (pain).     . pantoprazole (PROTONIX) 40 MG tablet Take 1 tablet (40 mg total) by mouth 2 (two) times daily. 180 tablet 3  . traZODone (DESYREL) 50 MG tablet Take 50 mg by mouth at bedtime.      No current facility-administered medications on file prior to visit.     Social History  Tobacco Use  . Smoking status: Former Research scientist (life sciences)  . Smokeless tobacco: Never Used  Substance Use Topics  . Alcohol use: Yes    Comment: 5-7 glasses per week  . Drug use: Not on file    Review of Systems    Objective:    There were no vitals taken for this visit. BP Readings from Last 3 Encounters:  10/26/18 118/82  07/19/18 110/70  06/26/18 135/80   Wt Readings from Last 3 Encounters:  07/19/18 160 lb 1.9 oz (72.6 kg)  06/26/18 159 lb (72.1 kg)  03/20/18 159 lb (72.1 kg)    Physical Exam     Assessment & Plan:   Problem List Items Addressed This Visit    None       I am having Claudia Bumps "Alda" maintain her traZODone, naproxen sodium, aspirin EC, pantoprazole, Calcium Carb-Cholecalciferol (CALCIUM 600 + D PO),  Fluticasone Furoate (FLONASE SENSIMIST NA), alendronate, gabapentin, atorvastatin, and fluvoxaMINE.   No orders of the defined types were placed in this encounter.   Return precautions given.   Risks, benefits, and alternatives of the medications and treatment plan prescribed today were discussed, and patient expressed understanding.   Education regarding symptom management and diagnosis given to patient on AVS.  Continue to follow with Burnard Hawthorne, FNP for routine health maintenance.   Claudia Bumps and I agreed with plan.   Mable Paris, FNP

## 2018-11-12 NOTE — Telephone Encounter (Signed)
Copied from [place] [id number]. Topic: Quick Communication - Rx Refill/Question >> Nov 12, 2018  9:30 AM Alfredia Ferguson R wrote: Medication: pantoprazole (PROTONIX) 40 MG tablet  Has the patient contacted their pharmacy? Yes  Preferred Pharmacy (with phone number or street name): [place], [place] [phone] (Phone) [phone] (Fax)    Agent: Please be advised that RX refills may take up to 3 business days. We ask that you follow-up with your pharmacy.

## 2018-11-12 NOTE — Telephone Encounter (Signed)
Requested medication (s) are due for refill today: Yes  Requested medication (s) are on the active medication list: Yes  Last refill:  10/25/17  Future visit scheduled: Yes  Notes to clinic:  Expired Rx, unable to refill.     Requested Prescriptions  Pending Prescriptions Disp Refills   pantoprazole (PROTONIX) 40 MG tablet 180 tablet 3    Sig: Take 1 tablet (40 mg total) by mouth 2 (two) times daily.     Gastroenterology: Proton Pump Inhibitors Passed - 11/12/2018 11:39 AM      Passed - Valid encounter within last 12 months    Recent Outpatient Visits          2 weeks ago Gluteal pain   [facility] [place] McLean-Scocuzza, Nino Glow, MD   1 year ago Screen for colon cancer   [facility] [place], Yvetta Coder, FNP      Future Appointments            In 2 days [place], Yvetta Coder, [place], [place]   In 8 months O'Brien-Blaney, Bryson [place], [place], [place]

## 2018-11-13 MED ORDER — PANTOPRAZOLE SODIUM 40 MG PO TBEC: 40.0000 mg | DELAYED_RELEASE_TABLET | Freq: Two times a day (BID) | ORAL | 3 refills | Status: DC

## 2018-11-14 ENCOUNTER — Ambulatory Visit: Payer: Medicare Other | Admitting: Family

## 2018-11-15 ENCOUNTER — Ambulatory Visit
Admission: RE | Admit: 2018-11-15 | Discharge: 2018-11-15 | Disposition: A | Discharge status: Home / Self Care | Payer: Medicare Other | Source: Ambulatory Visit | Attending: Internal Medicine | Admitting: Internal Medicine

## 2018-11-15 DIAGNOSIS — R600 Localized edema: Secondary | ICD-10-CM | POA: Diagnosis not present

## 2018-11-15 NOTE — Progress Notes (Signed)
*  PRELIMINARY RESULTS* Echocardiogram 2D Echocardiogram has been performed.  Sherrie Sport 11/15/2018, 11:41 AM

## 2018-11-16 ENCOUNTER — Other Ambulatory Visit: Payer: Self-pay

## 2018-11-16 ENCOUNTER — Ambulatory Visit: Payer: Medicare Other | Admitting: Family

## 2018-11-16 ENCOUNTER — Encounter: Payer: Self-pay | Admitting: Family

## 2018-11-16 VITALS — BP 120/70 | HR 68 | Temp 97.8°F | Wt 166.4 lb

## 2018-11-16 DIAGNOSIS — Z23 Encounter for immunization: Secondary | ICD-10-CM | POA: Insufficient documentation

## 2018-11-16 DIAGNOSIS — F325 Major depressive disorder, single episode, in full remission: Secondary | ICD-10-CM

## 2018-11-16 DIAGNOSIS — M810 Age-related osteoporosis without current pathological fracture: Secondary | ICD-10-CM | POA: Diagnosis not present

## 2018-11-16 LAB — COMPREHENSIVE METABOLIC PANEL
ALT: 12 U/L (ref 0–35)
AST: 18 U/L (ref 0–37)
Albumin: 3.8 g/dL (ref 3.5–5.2)
Alkaline Phosphatase: 276 U/L — ABNORMAL HIGH (ref 39–117)
BUN: 21 mg/dL (ref 6–23)
CHLORIDE: 103 meq/L (ref 96–112)
CO2: 25 mEq/L (ref 19–32)
Calcium: 8.9 mg/dL (ref 8.4–10.5)
Creatinine, Ser: 0.92 mg/dL (ref 0.40–1.20)
GFR: 59.28 mL/min — ABNORMAL LOW (ref >60.00)
Glucose, Bld: 87 mg/dL (ref 70–99)
Potassium: 4.6 mEq/L (ref 3.5–5.1)
SODIUM: 137 meq/L (ref 135–145)
Total Bilirubin: 0.4 mg/dL (ref 0.2–1.2)
Total Protein: 6.4 g/dL (ref 6.0–8.3)

## 2018-11-16 LAB — CBC WITH DIFFERENTIAL/PLATELET
BASOS PCT: 1.4 % (ref 0.0–3.0)
Basophils Absolute: 0.1 10*3/uL (ref 0.0–0.1)
Eosinophils Absolute: 0.4 10*3/uL (ref 0.0–0.7)
Eosinophils Relative: 4.9 % (ref 0.0–5.0)
HCT: 34.3 % — ABNORMAL LOW (ref 36.0–46.0)
Hemoglobin: 11.7 g/dL — ABNORMAL LOW (ref 12.0–15.0)
Lymphocytes Relative: 17.2 % (ref 12.0–46.0)
Lymphs Abs: 1.4 10*3/uL (ref 0.7–4.0)
MCHC: 34 g/dL (ref 30.0–36.0)
MCV: 98.2 fl (ref 78.0–100.0)
Monocytes Absolute: 1 10*3/uL (ref 0.1–1.0)
Monocytes Relative: 11.9 % (ref 3.0–12.0)
Neutro Abs: 5.4 10*3/uL (ref 1.4–7.7)
Neutrophils Relative %: 64.6 % (ref 43.0–77.0)
Platelets: 283 10*3/uL (ref 150.0–400.0)
RBC: 3.5 Mil/uL — AB (ref 3.87–5.11)
RDW: 14.4 % (ref 11.5–15.5)
WBC: 8.3 10*3/uL (ref 4.0–10.5)

## 2018-11-16 LAB — VITAMIN D 25 HYDROXY (VIT D DEFICIENCY, FRACTURES): VITD: 59.76 ng/mL (ref 30.00–100.00)

## 2018-11-16 LAB — TSH: TSH: 2.03 u[IU]/mL (ref 0.35–4.50)

## 2018-11-16 MED ORDER — FLUVOXAMINE MALEATE 100 MG PO TABS: 50.0000 mg | ORAL_TABLET | Freq: Every day | ORAL | 1 refills | Status: DC

## 2018-11-16 MED ORDER — PANTOPRAZOLE SODIUM 40 MG PO TBEC: 40.0000 mg | DELAYED_RELEASE_TABLET | Freq: Two times a day (BID) | ORAL | 3 refills | Status: DC

## 2018-11-16 NOTE — Progress Notes (Signed)
Subjective:    Patient ID: Claudia Andersen, female    DOB: Jan 26, 1941, 78 y.o.   MRN: [id number]  CC: Claudia Andersen is a 78 y.o. female who presents today for follow up.   HPI: Continues to have right hip, groin pain.  Using gabapentin with some relief  Depression-feels well Luvox, trazodone would like refill the Luvox  Osteoporosis-had been Fosamax in the past, have since stopped.   Dr Sharyne Richters - not planning surgical intervention for compression fracture, L1, L3 ( new).   Acetabular fracture, pelvic fracture, compression fractures. Currently doing PT Following with Dr Harlow Mares for total right hip replacement ; he has  deferred until symptoms improve.  No h/o chicken pox.       Fall 06/2018 HISTORY:  Past Medical History:  Diagnosis Date  . Arthritis   . Back abrasion    CRUSHED VERTEBRAE  . Depression   . Gallstones   . GERD (gastroesophageal reflux disease)   . Migraines    Past Surgical History:  Procedure Laterality Date  . ABDOMINAL HYSTERECTOMY    . CATARACT EXTRACTION W/PHACO Right 03/27/2018   Procedure: CATARACT EXTRACTION PHACO AND INTRAOCULAR LENS PLACEMENT (IOC);  Surgeon: Birder Robson, MD;  Location: [facility] ORS;  Service: Ophthalmology;  Laterality: Right;  [place] 00:33 AP% 15.3 CDE 5.18 Fluid pack lot # [id number]  . CATARACT EXTRACTION W/PHACO Left 06/26/2018   Procedure: CATARACT EXTRACTION PHACO AND INTRAOCULAR LENS PLACEMENT ([place]);  Surgeon: Birder Robson, MD;  Location: [facility] ORS;  Service: Ophthalmology;  Laterality: Left;  [place] 00:38 AP% 15.2 CDE 5.80 Fluid pack lot # [id number]  . FRACTURE SURGERY     RIGHT RADIAL  . JOINT REPLACEMENT     LEFT TKR  . TRIPLE SUBTALAR FUSION     Family History  Problem Relation Age of Onset  . Arthritis Mother   . Heart disease Mother   . Stroke Mother   . Arthritis Father   . Heart disease Father   . Stroke Father   . Breast cancer Neg Hx   . Colon cancer Neg Hx     Allergies: Patient has no known  allergies. Current Outpatient Medications on File Prior to Visit  Medication Sig Dispense Refill  . alendronate (FOSAMAX) 70 MG tablet Take 1 tablet (70 mg total) by mouth once a week. Take with a full glass of water on an empty stomach. 12 tablet 0  . aspirin EC 81 MG tablet Take 81 mg by mouth daily.    Marland Kitchen atorvastatin (LIPITOR) 10 MG tablet Take 1 tablet (10 mg total) by mouth daily. 90 tablet 1  . Calcium Carb-Cholecalciferol (CALCIUM 600 + D PO) Take 2 tablets by mouth daily.     . Fluticasone Furoate (FLONASE SENSIMIST NA) Place 1 spray into both nostrils daily as needed (allergies).    . gabapentin (NEURONTIN) 300 MG capsule Take 1 capsule (300 mg total) by mouth at bedtime. 90 capsule 3  . naproxen sodium (ANAPROX) 220 MG tablet Take 220 mg by mouth daily as needed (pain).     . traZODone (DESYREL) 50 MG tablet Take 50 mg by mouth at bedtime.      No current facility-administered medications on file prior to visit.     Social History   Tobacco Use  . Smoking status: Former Research scientist (life sciences)  . Smokeless tobacco: Never Used  Substance Use Topics  . Alcohol use: Yes    Comment: 5-7 glasses per week  . Drug use: Not on file  Review of Systems  Constitutional: Negative for chills and fever.  Respiratory: Negative for cough.   Cardiovascular: Negative for chest pain and palpitations.  Gastrointestinal: Negative for nausea and vomiting.  Musculoskeletal: Positive for arthralgias and back pain.      Objective:    BP 120/70   Pulse 68   Temp 97.8 F (36.6 C)   Wt 166 lb 6.4 oz (75.5 kg)   SpO2 96%   BMI 30.43 kg/m  BP Readings from Last 3 Encounters:  11/16/18 120/70  10/26/18 118/82  07/19/18 110/70   Wt Readings from Last 3 Encounters:  11/16/18 166 lb 6.4 oz (75.5 kg)  07/19/18 160 lb 1.9 oz (72.6 kg)  06/26/18 159 lb (72.1 kg)    Physical Exam Vitals signs reviewed.  Constitutional:      Appearance: She is well-developed.  Eyes:     Conjunctiva/sclera:  Conjunctivae normal.  Cardiovascular:     Rate and Rhythm: Normal rate and regular rhythm.     Pulses: Normal pulses.     Heart sounds: Normal heart sounds.  Pulmonary:     Effort: Pulmonary effort is normal.     Breath sounds: Normal breath sounds. No wheezing, rhonchi or rales.  Skin:    General: Skin is warm and dry.  Neurological:     Mental Status: She is alert.  Psychiatric:        Speech: Speech normal.        Behavior: Behavior normal.        Thought Content: Thought content normal.        Assessment & Plan:   Problem List Items Addressed This Visit      Musculoskeletal and Integument   Osteoporosis - Primary    Discussed potential treatment  options including Reclast, Prolia.  We jointly agreed we would consult endocrinology prior to making decision. Pending labs, dexa ( scheduled).      Relevant Orders   CBC with Differential/Platelet   Comprehensive metabolic panel   TSH   VITAMIN D 25 Hydroxy (Vit-D Deficiency, Fractures)   Ambulatory referral to Endocrinology     Other   Depression, major, single episode, complete remission ([place])   Relevant Medications   fluvoxaMINE (LUVOX) 100 MG tablet   Need for shingles vaccine    Interested in shingrex;  pending varicella abs.      Relevant Orders   Varicella Zoster Abs, IgG/IgM       I have changed Claudia Bumps "Sandy"'s fluvoxaMINE. I am also having her maintain her traZODone, naproxen sodium, aspirin EC, Calcium Carb-Cholecalciferol (CALCIUM 600 + D PO), Fluticasone Furoate (FLONASE SENSIMIST NA), alendronate, gabapentin, and atorvastatin.   Meds ordered this encounter  Medications  . fluvoxaMINE (LUVOX) 100 MG tablet    Sig: Take 0.5-1 tablets (50-100 mg total) by mouth daily.    Dispense:  90 tablet    Refill:  1    Order Specific Question:   Supervising Provider    Answer:   Crecencio Mc [[id number]]    Return precautions given.   Risks, benefits, and alternatives of the medications and  treatment plan prescribed today were discussed, and patient expressed understanding.   Education regarding symptom management and diagnosis given to patient on AVS.  Continue to follow with Burnard Hawthorne, FNP for routine health maintenance.   Claudia Bumps and I agreed with plan.   Mable Paris, FNP

## 2018-11-16 NOTE — Assessment & Plan Note (Signed)
Interested in shingrex;  pending varicella abs.

## 2018-11-16 NOTE — Assessment & Plan Note (Addendum)
Discussed potential treatment  options including Reclast, Prolia.  We jointly agreed we would consult endocrinology prior to making decision. Pending labs, dexa ( scheduled).

## 2018-11-16 NOTE — Patient Instructions (Addendum)
Options discussed today are Reclast or Prolia.   Prolia is not a biphosphonate   Reclast and Fosamax are biphosphonate   Today we discussed referrals, orders. endocrine   I have placed these orders in the system for you.  Please be sure to give [place] a call if you have not heard from our office regarding this. We should hear from [place] within ONE week with information regarding your appointment. If not, please let me know immediately.

## 2018-11-18 LAB — VARICELLA ZOSTER ABS, IGG/IGM
Varicella IgM: <0.91 index (ref 0.00–0.90)
Varicella zoster IgG: 216 index (ref >165)

## 2018-11-20 ENCOUNTER — Ambulatory Visit
Admission: RE | Admit: 2018-11-20 | Discharge: 2018-11-20 | Disposition: A | Discharge status: Home / Self Care | Payer: Medicare Other | Source: Ambulatory Visit | Attending: Internal Medicine | Admitting: Internal Medicine

## 2018-11-20 DIAGNOSIS — M810 Age-related osteoporosis without current pathological fracture: Secondary | ICD-10-CM | POA: Diagnosis not present

## 2018-11-21 ENCOUNTER — Other Ambulatory Visit: Payer: Self-pay | Admitting: Family

## 2018-11-21 DIAGNOSIS — R748 Abnormal levels of other serum enzymes: Secondary | ICD-10-CM

## 2018-11-21 DIAGNOSIS — D649 Anemia, unspecified: Secondary | ICD-10-CM

## 2018-11-21 NOTE — Progress Notes (Signed)
Subjective:    Patient ID: Claudia Andersen, female    DOB: 05/20/1941, 77 y.o.   MRN: [id number]  CC: Claudia Andersen is a 78 y.o. female who presents today for follow up.   HPI: Surgery clearance [facility] total r hip replacement scheduled for 01/11/19.  Complains of Swelling in legs for several months, unchanged. Onset correlates with not walking as much due to right hip pain, sitting more, and using walker.  Doesn't wear compression stockings often as hard to get on. Follows low salt diet. Elevates her legs with one pillow without much relief.   NO CP, SOB, orthopnea.   Elevated alk phos- suspect bone etiology.  Anemia- no blood from vagina or seen in stool. Cologuard 2 years ago- negative    HISTORY:  Past Medical History:  Diagnosis Date  . Arthritis   . Back abrasion    CRUSHED VERTEBRAE  . Depression   . Gallstones   . GERD (gastroesophageal reflux disease)   . Migraines    Past Surgical History:  Procedure Laterality Date  . ABDOMINAL HYSTERECTOMY    . CATARACT EXTRACTION W/PHACO Right 03/27/2018   Procedure: CATARACT EXTRACTION PHACO AND INTRAOCULAR LENS PLACEMENT (I[facility]);  Surgeon: Birder Robson, MD;  Location: [facility] OR[facility];  Service: Ophthalmology;  Laterality: Right;  [place] 00:33 AP% 15.3 CDE 5.18 Fluid pack lot # [id number]  . CATARACT EXTRACTION W/PHACO Left 06/26/2018   Procedure: CATARACT EXTRACTION PHACO AND INTRAOCULAR LENS PLACEMENT ([place]);  Surgeon: Birder Robson, MD;  Location: [facility] OR[facility];  Service: Ophthalmology;  Laterality: Left;  [place] 00:38 AP% 15.2 CDE 5.80 Fluid pack lot # [id number]  . FRACTURE SURGERY     RIGHT RADIAL  . JOINT REPLACEMENT     LEFT TKR  . TRIPLE SUBTALAR FUSION     Family History  Problem Relation Age of Onset  . Arthritis Mother   . Heart disease Mother   . Stroke Mother   . Arthritis Father   . Heart disease Father   . Stroke Father   . Breast cancer Neg Hx   . Colon cancer Neg Hx     Allergies: Patient has no known  allergies. Current Outpatient Medications on File Prior to Visit  Medication Sig Dispense Refill  . aspirin EC 81 MG tablet Take 81 mg by mouth daily.    Marland Kitchen atorvastatin (LIPITOR) 10 MG tablet Take 1 tablet (10 mg total) by mouth daily. 90 tablet 1  . Calcium Carb-Cholecalciferol (CALCIUM 600 + D PO) Take 2 tablets by mouth daily.     . Fluticasone Furoate (FLONASE SENSIMIST NA) Place 1 spray into both nostrils daily as needed (allergies).    . fluvoxaMINE (LUVOX) 100 MG tablet Take 0.5-1 tablets (50-100 mg total) by mouth daily. 90 tablet 1  . gabapentin (NEURONTIN) 300 MG capsule Take 1 capsule (300 mg total) by mouth at bedtime. 90 capsule 3  . naproxen sodium (ANAPROX) 220 MG tablet Take 220 mg by mouth daily as needed (pain).     . pantoprazole (PROTONIX) 40 MG tablet Take 1 tablet (40 mg total) by mouth 2 (two) times daily. 180 tablet 3  . traZODone (DESYREL) 50 MG tablet Take 50 mg by mouth at bedtime.      No current facility-administered medications on file prior to visit.     Social History   Tobacco Use  . Smoking status: Former Research scientist (life sciences)  . Smokeless tobacco: Never Used  Substance Use Topics  . Alcohol use: Yes  Comment: 5-7 glasses per week  . Drug use: Not on file    Review of Systems  Constitutional: Negative for chills and fever.  Respiratory: Negative for cough, shortness of breath and wheezing.   Cardiovascular: Positive for leg swelling. Negative for chest pain and palpitations.  Gastrointestinal: Negative for nausea and vomiting.  Musculoskeletal: Positive for arthralgias.      Objective:    BP 130/82 (BP Location: Left Arm, Patient Position: Sitting, Cuff Size: Normal)   Pulse 80   Ht 5' 2"  (1.575 m)   Wt 168 lb 9.6 oz (76.5 kg)   SpO2 99%   BMI 30.84 kg/m  BP Readings from Last 3 Encounters:  11/23/18 130/82  11/16/18 120/70  10/26/18 118/82   Wt Readings from Last 3 Encounters:  11/23/18 168 lb 9.6 oz (76.5 kg)  11/16/18 166 lb 6.4 oz (75.5 kg)    07/19/18 160 lb 1.9 oz (72.6 kg)    Physical Exam Vitals signs reviewed.  Constitutional:      Appearance: She is well-developed.  Eyes:     Conjunctiva/sclera: Conjunctivae normal.  Cardiovascular:     Rate and Rhythm: Normal rate and regular rhythm.     Pulses: Normal pulses.     Heart sounds: Normal heart sounds.     Comments: BLE +1 pitting edema. No palpable cords or masses. No erythema or increased warmth. No asymmetry in calf size when compared bilaterally LE hair growth symmetric and present. No discoloration or varicosities noted. LE warm and palpable pedal pulses.  Pulmonary:     Effort: Pulmonary effort is normal.     Breath sounds: Normal breath sounds. No wheezing, rhonchi or rales.  Musculoskeletal:     Right lower leg: Edema present.     Left lower leg: Edema present.  Skin:    General: Skin is warm and dry.  Neurological:     Mental Status: She is alert.  Psychiatric:        Speech: Speech normal.        Behavior: Behavior normal.        Thought Content: Thought content normal.        Assessment & Plan:   Problem List Items Addressed This Visit      Other   Leg edema    Working diagnosis of dependent edema due to activity change. No adventitious lung sounds. However weight has increased by 8 pound in 5 months. Echocardiogram done 11/2018 however results unavailable in epic.  Working on getting this report to ensure normal ejection fraction.  Given patient hydrochlorothiazide to use as needed or daily if swelling is needed. Repeat BMP.       Relevant Medications   hydrochlorothiazide (MICROZIDE) 12.5 MG capsule   Other Relevant Orders   ECHOCARDIOGRAM COMPLETE   Basic metabolic panel   Preoperative evaluation to rule out surgical contraindication - Primary    T wave inversions. No acute ischemia. No prior EKG to compare. Agreed that we would pursue cardiology consult prior to surgery. Pending labs.       Relevant Orders   EKG 12-Lead (Completed)    EKG 12-Lead   Ambulatory referral to Cardiology   Protime-INR       I have discontinued [name] "Sandy"'s alendronate. I am also having her start on hydrochlorothiazide. Additionally, I am having her maintain her traZODone, naproxen sodium, aspirin EC, Calcium Carb-Cholecalciferol (CALCIUM 600 + D PO), Fluticasone Furoate (FLONASE SENSIMIST NA), gabapentin, atorvastatin, pantoprazole, and fluvoxaMINE.   Meds ordered  this encounter  Medications  . hydrochlorothiazide (MICROZIDE) 12.5 MG capsule    Sig: Take 1 capsule (12.5 mg total) by mouth daily.    Dispense:  90 capsule    Refill:  0    Order Specific Question:   Supervising Provider    Answer:   Crecencio Mc [[id number]]    Return precautions given.   Risks, benefits, and alternatives of the medications and treatment plan prescribed today were discussed, and patient expressed understanding.   Education regarding symptom management and diagnosis given to patient on AVS.  Continue to follow with Burnard Hawthorne, FNP for routine health maintenance.   [name] and I agreed with plan.   Mable Paris, FNP

## 2018-11-23 ENCOUNTER — Ambulatory Visit: Payer: Medicare Other | Admitting: Family

## 2018-11-23 ENCOUNTER — Encounter: Payer: Self-pay | Admitting: Family

## 2018-11-23 VITALS — BP 130/82 | HR 80 | Ht 62.0 in | Wt 168.6 lb

## 2018-11-23 DIAGNOSIS — Z01818 Encounter for other preprocedural examination: Secondary | ICD-10-CM

## 2018-11-23 DIAGNOSIS — R600 Localized edema: Secondary | ICD-10-CM | POA: Diagnosis not present

## 2018-11-23 MED ORDER — HYDROCHLOROTHIAZIDE 12.5 MG PO CAPS: 12.5000 mg | ORAL_CAPSULE | Freq: Every day | ORAL | 0 refills | Status: DC

## 2018-11-23 NOTE — Progress Notes (Signed)
Stool card placed up front, pt needs to be contact regarding coming back for labs.   Sorry!!

## 2018-11-23 NOTE — Assessment & Plan Note (Addendum)
T wave inversions. No acute ischemia. No prior EKG to compare. Agreed that we would pursue cardiology consult prior to surgery. Pending labs.

## 2018-11-23 NOTE — Progress Notes (Signed)
Forwarding to Sarah.

## 2018-11-23 NOTE — Patient Instructions (Addendum)
  Trial hctz for leg swelling. Please elevate, wear compression stockings.   Today we discussed referrals, orders. cardiology   I have placed these orders in the system for you.  Please be sure to give [place] a call if you have not heard from our office regarding this. We should hear from [place] within ONE week with information regarding your appointment. If not, please let me know immediately.

## 2018-11-26 ENCOUNTER — Other Ambulatory Visit (INDEPENDENT_AMBULATORY_CARE_PROVIDER_SITE_OTHER): Payer: Medicare Other

## 2018-11-26 DIAGNOSIS — Z01818 Encounter for other preprocedural examination: Secondary | ICD-10-CM | POA: Diagnosis not present

## 2018-11-26 DIAGNOSIS — R600 Localized edema: Secondary | ICD-10-CM

## 2018-11-26 LAB — BASIC METABOLIC PANEL
BUN: 16 mg/dL (ref 6–23)
CO2: 26 mEq/L (ref 19–32)
Calcium: 8.9 mg/dL (ref 8.4–10.5)
Chloride: 104 mEq/L (ref 96–112)
Creatinine, Ser: 0.81 mg/dL (ref 0.40–1.20)
GFR: 68.65 mL/min (ref >60.00)
Glucose, Bld: 88 mg/dL (ref 70–99)
Potassium: 4.4 mEq/L (ref 3.5–5.1)
SODIUM: 139 meq/L (ref 135–145)

## 2018-11-26 LAB — PROTIME-INR
INR: 1.1 ratio — ABNORMAL HIGH (ref 0.8–1.0)
Prothrombin Time: 12.3 s (ref 9.6–13.1)

## 2018-11-26 NOTE — Progress Notes (Signed)
I spoke with patient & scheduled her for to today(11/26/18) at 11:15 for labs. Patient also knows to pick up stool cards at front desk. Patient stated that she though that echo was done at [facility] at [facility] entrance of [facility].

## 2018-11-26 NOTE — Assessment & Plan Note (Addendum)
Working diagnosis of dependent edema due to activity change. No adventitious lung sounds. However weight has increased by 8 pound in 5 months. Echocardiogram done 11/2018 however results unavailable in epic.  Working on getting this report to ensure normal ejection fraction.  Given patient hydrochlorothiazide to use as needed or daily if swelling is needed. Repeat BMP.

## 2018-11-27 ENCOUNTER — Telehealth: Payer: Self-pay | Admitting: Family

## 2018-11-27 NOTE — Telephone Encounter (Signed)
Copied from [place] [id number]. Topic: Quick Communication - See Telephone Encounter >> Nov 27, 2018 10:07 AM Sheran Luz wrote: CRM for notification. See Telephone encounter for: 11/27/18.  Butch Penny, with Dr. Bethanne Ginger office (Cardiology) states that they never received copy of EKG when referral was sent and that it is required with the referral (see referral from 2/07). Butch Penny is requesting this be faxed as soon as possible to office. Please advise.   Fax# [phone]

## 2018-11-27 NOTE — Telephone Encounter (Signed)
I have notified Dr. Marthenia Rolling office that we have been trying to find where exactly echo was performed at due to [place] having trouble viewing it. I stated that we would fax when we finally received this.

## 2018-11-27 NOTE — Progress Notes (Signed)
I have contacted Sonia Side, who is the person who completed Echo. He had patient's at the time, but took down Claudia Andersen info to see if he could look ii up. I gave him my call back number & fax number to send to [place]. He stated that due to his patient volume it may be awhile before it gets to [place]. He stated that he would get his director involved if he did not have time.

## 2018-11-27 NOTE — Telephone Encounter (Signed)
Claudia Andersen, the [employer] from [facility] calling.  States that this test has not been read yet.  They have called a doctor to read this - it should be done today.   Claudia Andersen states that this should be visible in Epic once it has been read.

## 2018-11-28 ENCOUNTER — Encounter: Payer: Self-pay | Admitting: Family

## 2018-11-28 NOTE — Telephone Encounter (Signed)
Call jerry  Still hasnt been read  We need this asap

## 2018-11-28 NOTE — Progress Notes (Signed)
Not sure why echo results not read from 11/15/2018 [facility] readers/[place]  Please help    [place]

## 2018-11-28 NOTE — Progress Notes (Signed)
I spoke again with Sonia Side & he stated that they switched their servers back on 11/12/18. He thinks that maybe somehow some of the images are stuck? I stated that we needed this ASAP for surgery clearance. He stated that he would get his director to see if she could find this due to the amount of patient's he has.

## 2018-11-29 ENCOUNTER — Encounter: Payer: Self-pay | Admitting: Family

## 2018-11-30 ENCOUNTER — Telehealth: Payer: Self-pay

## 2018-11-30 ENCOUNTER — Telehealth: Payer: Self-pay | Admitting: Family

## 2018-11-30 NOTE — Telephone Encounter (Signed)
LM that we finally received copy of ECHO.

## 2018-11-30 NOTE — Telephone Encounter (Signed)
Copied from [place] [id number]. Topic: General - Other >> Nov 30, 2018 12:17 PM Waldemar Dickens, Valda Favia wrote: Patients daughter Aldona Bar is calling in to get verification that her mom is following all orders and directions given by NP Arnett  CB# [phone]

## 2018-11-30 NOTE — Progress Notes (Signed)
We still dont have echo results  PCP is looking for them Mable Paris though I ordered it   [place]

## 2018-12-03 ENCOUNTER — Other Ambulatory Visit: Payer: Self-pay | Admitting: Family

## 2018-12-03 DIAGNOSIS — R600 Localized edema: Secondary | ICD-10-CM

## 2018-12-03 NOTE — Telephone Encounter (Signed)
I spoke with patient's daughter & stated that she had been totally compliant. Joycelyn Schmid stated that she had been wonderful to have as a patient. Daughter asked that if for any reason mom did not show up for an appointment or became non-compliant that will call her.

## 2018-12-06 ENCOUNTER — Telehealth: Payer: Self-pay

## 2018-12-06 NOTE — Telephone Encounter (Signed)
Copied from [place] [id number]. Topic: General - Other >> Dec 06, 2018  1:39 PM Rayann Heman wrote: Reason for CRM: pt called and stated that someone tried to call her regarding ECHO. Please advise

## 2018-12-06 NOTE — Telephone Encounter (Signed)
LM stating that I was unsure who would have called today if anyone? I stated that I had called Friday, but that was just to say that we now had ECHO.

## 2018-12-07 DIAGNOSIS — F419 Anxiety disorder, unspecified: Secondary | ICD-10-CM | POA: Insufficient documentation

## 2018-12-17 ENCOUNTER — Encounter: Payer: Self-pay | Admitting: Family

## 2018-12-18 NOTE — Telephone Encounter (Signed)
Sent to provider to advise   Last Ov 11/23/2018 disp 90 with no refills   Rx states to take once daily

## 2018-12-19 ENCOUNTER — Other Ambulatory Visit (INDEPENDENT_AMBULATORY_CARE_PROVIDER_SITE_OTHER): Payer: Medicare Other

## 2018-12-19 ENCOUNTER — Other Ambulatory Visit: Payer: Self-pay | Admitting: Family

## 2018-12-19 DIAGNOSIS — R600 Localized edema: Secondary | ICD-10-CM

## 2018-12-19 DIAGNOSIS — D649 Anemia, unspecified: Secondary | ICD-10-CM

## 2018-12-19 LAB — FECAL OCCULT BLOOD, IMMUNOCHEMICAL: Fecal Occult Bld: NEGATIVE

## 2018-12-24 ENCOUNTER — Encounter: Payer: Self-pay | Admitting: Family

## 2018-12-25 ENCOUNTER — Telehealth: Payer: Self-pay | Admitting: Radiology

## 2018-12-25 ENCOUNTER — Other Ambulatory Visit (INDEPENDENT_AMBULATORY_CARE_PROVIDER_SITE_OTHER): Payer: Medicare Other

## 2018-12-25 DIAGNOSIS — R600 Localized edema: Secondary | ICD-10-CM

## 2018-12-25 LAB — BASIC METABOLIC PANEL
BUN: 17 mg/dL (ref 6–23)
CO2: 29 mEq/L (ref 19–32)
CREATININE: 0.8 mg/dL (ref 0.40–1.20)
Calcium: 9.5 mg/dL (ref 8.4–10.5)
Chloride: 104 mEq/L (ref 96–112)
GFR: 69.63 mL/min (ref >60.00)
Glucose, Bld: 98 mg/dL (ref 70–99)
Potassium: 4.7 mEq/L (ref 3.5–5.1)
Sodium: 141 mEq/L (ref 135–145)

## 2018-12-25 LAB — LIPID PANEL
Cholesterol: 165 mg/dL (ref 0–200)
HDL: 95.8 mg/dL (ref >39.00)
LDL Cholesterol: 57 mg/dL (ref 0–99)
NonHDL: 68.79
Total CHOL/HDL Ratio: 2
Triglycerides: 57 mg/dL (ref 0.0–149.0)
VLDL: 11.4 mg/dL (ref 0.0–40.0)

## 2018-12-25 NOTE — Addendum Note (Signed)
Addended by: Arby Barrette on: 12/25/2018 09:52 AM   Modules accepted: Orders

## 2018-12-25 NOTE — Telephone Encounter (Signed)
[place] ,  That is fine. Please add lipid panel and I will co sign.   Know it's impossible to keep up but I'm not in the office on Tues/Thurs.   Just happen to log in this am. You can always ask my nurse Judson Roch what to do or Philis Nettle - they cover for me.

## 2018-12-25 NOTE — Telephone Encounter (Signed)
Ok will add on lipid panel for pt.

## 2018-12-25 NOTE — Telephone Encounter (Signed)
Pt came in for labs today and asked if she could have her cholesterol checked and stated she was fasting. Advised pt that provider did not order it but I would ask. Would you like to send out for lipid panel?

## 2019-01-02 ENCOUNTER — Ambulatory Visit: Payer: Medicare Other | Admitting: Family

## 2019-01-08 ENCOUNTER — Ambulatory Visit: Payer: Self-pay | Admitting: Orthopedic Surgery

## 2019-01-08 ENCOUNTER — Encounter: Payer: Self-pay | Admitting: *Deleted

## 2019-01-08 MED ORDER — TRANEXAMIC ACID-NACL 1000-0.7 MG/100ML-% IV SOLN
1000.0000 mg | INTRAVENOUS | Status: AC
  Administered 2019-01-09: 1000 mg via INTRAVENOUS
  Filled 2019-01-08: qty 100

## 2019-01-08 MED ORDER — CEFAZOLIN SODIUM-DEXTROSE 2-4 GM/100ML-% IV SOLN
2.0000 g | INTRAVENOUS | Status: AC
  Administered 2019-01-09: 2 g via INTRAVENOUS

## 2019-01-09 ENCOUNTER — Encounter: Payer: Self-pay | Admitting: *Deleted

## 2019-01-09 ENCOUNTER — Encounter
Admission: RE | Disposition: A | Discharge status: Home / Self Care | Payer: Self-pay | Source: Home / Self Care | Attending: Orthopedic Surgery

## 2019-01-09 ENCOUNTER — Inpatient Hospital Stay: Payer: Medicare Other

## 2019-01-09 ENCOUNTER — Ambulatory Visit: Payer: Medicare Other | Admitting: Anesthesiology

## 2019-01-09 ENCOUNTER — Inpatient Hospital Stay
Admission: RE | Admit: 2019-01-09 | Discharge: 2019-01-11 | DRG: 470 | Disposition: A | Discharge status: Home / Self Care | Payer: Medicare Other | Attending: Orthopedic Surgery | Admitting: Orthopedic Surgery

## 2019-01-09 ENCOUNTER — Other Ambulatory Visit: Payer: Self-pay

## 2019-01-09 ENCOUNTER — Ambulatory Visit: Payer: Medicare Other

## 2019-01-09 DIAGNOSIS — F329 Major depressive disorder, single episode, unspecified: Secondary | ICD-10-CM | POA: Diagnosis present

## 2019-01-09 DIAGNOSIS — Z09 Encounter for follow-up examination after completed treatment for conditions other than malignant neoplasm: Secondary | ICD-10-CM

## 2019-01-09 DIAGNOSIS — Z79891 Long term (current) use of opiate analgesic: Secondary | ICD-10-CM

## 2019-01-09 DIAGNOSIS — S32401A Unspecified fracture of right acetabulum, initial encounter for closed fracture: Principal | ICD-10-CM | POA: Diagnosis present

## 2019-01-09 DIAGNOSIS — Z791 Long term (current) use of non-steroidal anti-inflammatories (NSAID): Secondary | ICD-10-CM

## 2019-01-09 DIAGNOSIS — Z419 Encounter for procedure for purposes other than remedying health state, unspecified: Secondary | ICD-10-CM

## 2019-01-09 DIAGNOSIS — G43909 Migraine, unspecified, not intractable, without status migrainosus: Secondary | ICD-10-CM | POA: Diagnosis present

## 2019-01-09 DIAGNOSIS — S32409A Unspecified fracture of unspecified acetabulum, initial encounter for closed fracture: Secondary | ICD-10-CM | POA: Diagnosis present

## 2019-01-09 DIAGNOSIS — W19XXXA Unspecified fall, initial encounter: Secondary | ICD-10-CM | POA: Diagnosis present

## 2019-01-09 DIAGNOSIS — Z86711 Personal history of pulmonary embolism: Secondary | ICD-10-CM | POA: Diagnosis not present

## 2019-01-09 DIAGNOSIS — K219 Gastro-esophageal reflux disease without esophagitis: Secondary | ICD-10-CM | POA: Diagnosis present

## 2019-01-09 HISTORY — DX: Other pulmonary embolism without acute cor pulmonale: I26.99

## 2019-01-09 HISTORY — PX: TOTAL HIP ARTHROPLASTY: SHX124

## 2019-01-09 HISTORY — DX: Wedge compression fracture of unspecified lumbar vertebra, initial encounter for closed fracture: S32.000A

## 2019-01-09 HISTORY — DX: Displaced bimalleolar fracture of right lower leg, initial encounter for closed fracture: S82.841A

## 2019-01-09 HISTORY — DX: Unspecified fracture of right forearm, initial encounter for closed fracture: S52.91XA

## 2019-01-09 LAB — CBC WITH DIFFERENTIAL/PLATELET
Abs Immature Granulocytes: 0.04 10*3/uL (ref 0.00–0.07)
Basophils Absolute: 0.1 10*3/uL (ref 0.0–0.1)
Basophils Relative: 1 %
Eosinophils Absolute: 0.2 10*3/uL (ref 0.0–0.5)
Eosinophils Relative: 3 %
HCT: 37.2 % (ref 36.0–46.0)
Hemoglobin: 12.3 g/dL (ref 12.0–15.0)
Immature Granulocytes: 1 %
Lymphocytes Relative: 17 %
Lymphs Abs: 1.5 10*3/uL (ref 0.7–4.0)
MCH: 32.6 pg (ref 26.0–34.0)
MCHC: 33.1 g/dL (ref 30.0–36.0)
MCV: 98.7 fL (ref 80.0–100.0)
Monocytes Absolute: 1 10*3/uL (ref 0.1–1.0)
Monocytes Relative: 11 %
NRBC: 0 % (ref 0.0–0.2)
Neutro Abs: 5.8 10*3/uL (ref 1.7–7.7)
Neutrophils Relative %: 67 %
Platelets: 312 10*3/uL (ref 150–400)
RBC: 3.77 MIL/uL — ABNORMAL LOW (ref 3.87–5.11)
RDW: 12.7 % (ref 11.5–15.5)
WBC: 8.6 10*3/uL (ref 4.0–10.5)

## 2019-01-09 LAB — TYPE AND SCREEN
ABO/RH(D): A POS
Antibody Screen: NEGATIVE

## 2019-01-09 LAB — ABO/RH: ABO/RH(D): A POS

## 2019-01-09 SURGERY — ARTHROPLASTY, HIP, TOTAL, ANTERIOR APPROACH
Anesthesia: Spinal | Laterality: Right

## 2019-01-09 MED ORDER — ASPIRIN 81 MG PO CHEW
81.0000 mg | CHEWABLE_TABLET | Freq: Two times a day (BID) | ORAL | Status: DC
  Administered 2019-01-09 – 2019-01-11 (×4): 81 mg via ORAL
  Filled 2019-01-09 (×4): qty 1

## 2019-01-09 MED ORDER — FENTANYL CITRATE (PF) 100 MCG/2ML IJ SOLN
INTRAMUSCULAR | Status: AC
  Filled 2019-01-09: qty 2

## 2019-01-09 MED ORDER — METOCLOPRAMIDE HCL 5 MG/ML IJ SOLN: 5.0000 mg | Freq: Three times a day (TID) | INTRAMUSCULAR | Status: DC | PRN

## 2019-01-09 MED ORDER — ATORVASTATIN CALCIUM 10 MG PO TABS
10.0000 mg | ORAL_TABLET | Freq: Every day | ORAL | Status: DC
  Administered 2019-01-09 – 2019-01-10 (×2): 10 mg via ORAL
  Filled 2019-01-09 (×2): qty 1

## 2019-01-09 MED ORDER — BUPIVACAINE-EPINEPHRINE (PF) 0.25% -1:200000 IJ SOLN
INTRAMUSCULAR | Status: AC
  Filled 2019-01-09: qty 30

## 2019-01-09 MED ORDER — METOCLOPRAMIDE HCL 10 MG PO TABS: 5.0000 mg | ORAL_TABLET | Freq: Three times a day (TID) | ORAL | Status: DC | PRN

## 2019-01-09 MED ORDER — ACETAMINOPHEN 500 MG PO TABS
1000.0000 mg | ORAL_TABLET | Freq: Once | ORAL | Status: AC
  Administered 2019-01-09: 1000 mg via ORAL

## 2019-01-09 MED ORDER — ONDANSETRON HCL 4 MG PO TABS: 4.0000 mg | ORAL_TABLET | Freq: Four times a day (QID) | ORAL | Status: DC | PRN

## 2019-01-09 MED ORDER — PROPOFOL 500 MG/50ML IV EMUL
INTRAVENOUS | Status: AC
  Filled 2019-01-09: qty 50

## 2019-01-09 MED ORDER — ONDANSETRON HCL 4 MG/2ML IJ SOLN: 4.0000 mg | Freq: Once | INTRAMUSCULAR | Status: DC | PRN

## 2019-01-09 MED ORDER — CEFAZOLIN SODIUM-DEXTROSE 2-4 GM/100ML-% IV SOLN
INTRAVENOUS | Status: AC
  Filled 2019-01-09: qty 100

## 2019-01-09 MED ORDER — PANTOPRAZOLE SODIUM 40 MG PO TBEC
40.0000 mg | DELAYED_RELEASE_TABLET | Freq: Two times a day (BID) | ORAL | Status: DC
  Administered 2019-01-09 – 2019-01-11 (×4): 40 mg via ORAL
  Filled 2019-01-09 (×4): qty 1

## 2019-01-09 MED ORDER — PROPOFOL 500 MG/50ML IV EMUL
INTRAVENOUS | Status: DC | PRN
  Administered 2019-01-09: 100 ug/kg/min via INTRAVENOUS

## 2019-01-09 MED ORDER — BISACODYL 10 MG RE SUPP: 10.0000 mg | Freq: Every day | RECTAL | Status: DC | PRN

## 2019-01-09 MED ORDER — BUPIVACAINE-EPINEPHRINE (PF) 0.25% -1:200000 IJ SOLN
INTRAMUSCULAR | Status: DC | PRN
  Administered 2019-01-09: 20 mL via PERINEURAL

## 2019-01-09 MED ORDER — DOCUSATE SODIUM 100 MG PO CAPS
100.0000 mg | ORAL_CAPSULE | Freq: Two times a day (BID) | ORAL | Status: DC
  Administered 2019-01-09 – 2019-01-10 (×2): 100 mg via ORAL
  Filled 2019-01-09 (×4): qty 1

## 2019-01-09 MED ORDER — TRAZODONE HCL 50 MG PO TABS
50.0000 mg | ORAL_TABLET | Freq: Every evening | ORAL | Status: DC | PRN
  Administered 2019-01-09 – 2019-01-10 (×2): 100 mg via ORAL
  Filled 2019-01-09 (×2): qty 2

## 2019-01-09 MED ORDER — LACTATED RINGERS IV SOLN
INTRAVENOUS | Status: DC
  Administered 2019-01-09: 13:00:00 via INTRAVENOUS

## 2019-01-09 MED ORDER — LACTATED RINGERS IV SOLN
INTRAVENOUS | Status: DC
  Administered 2019-01-09: 75 mL/h via INTRAVENOUS

## 2019-01-09 MED ORDER — SODIUM CHLORIDE 0.9 % IR SOLN
Status: DC | PRN
  Administered 2019-01-09 (×2): 1000 mL

## 2019-01-09 MED ORDER — ACETAMINOPHEN 325 MG PO TABS: 325.0000 mg | ORAL_TABLET | Freq: Four times a day (QID) | ORAL | Status: DC | PRN

## 2019-01-09 MED ORDER — LACTATED RINGERS IV SOLN
INTRAVENOUS | Status: DC
  Administered 2019-01-09 – 2019-01-10 (×2): via INTRAVENOUS

## 2019-01-09 MED ORDER — ONDANSETRON HCL 4 MG/2ML IJ SOLN: 4.0000 mg | Freq: Four times a day (QID) | INTRAMUSCULAR | Status: DC | PRN

## 2019-01-09 MED ORDER — CHLORHEXIDINE GLUCONATE 4 % EX LIQD: 60.0000 mL | Freq: Once | CUTANEOUS | Status: DC

## 2019-01-09 MED ORDER — HYDROCODONE-ACETAMINOPHEN 10-325 MG PO TABS: 1.0000 | ORAL_TABLET | Freq: Two times a day (BID) | ORAL | Status: DC | PRN

## 2019-01-09 MED ORDER — MAGNESIUM CITRATE PO SOLN
1.0000 | Freq: Once | ORAL | Status: DC | PRN
  Filled 2019-01-09: qty 296

## 2019-01-09 MED ORDER — HYDROCODONE-ACETAMINOPHEN 5-325 MG PO TABS
1.0000 | ORAL_TABLET | ORAL | Status: DC | PRN
  Administered 2019-01-09: 2 via ORAL
  Administered 2019-01-10 – 2019-01-11 (×3): 1 via ORAL
  Administered 2019-01-11: 2 via ORAL
  Filled 2019-01-09 (×2): qty 1
  Filled 2019-01-09: qty 2
  Filled 2019-01-09 (×3): qty 1

## 2019-01-09 MED ORDER — CEFAZOLIN SODIUM-DEXTROSE 1-4 GM/50ML-% IV SOLN
1.0000 g | Freq: Four times a day (QID) | INTRAVENOUS | Status: AC
  Administered 2019-01-09 (×2): 1 g via INTRAVENOUS
  Filled 2019-01-09 (×2): qty 50

## 2019-01-09 MED ORDER — ACETAMINOPHEN 500 MG PO TABS
500.0000 mg | ORAL_TABLET | Freq: Four times a day (QID) | ORAL | Status: AC
  Administered 2019-01-09 – 2019-01-10 (×4): 500 mg via ORAL
  Filled 2019-01-09 (×4): qty 1

## 2019-01-09 MED ORDER — SODIUM CHLORIDE FLUSH 0.9 % IV SOLN
INTRAVENOUS | Status: AC
  Filled 2019-01-09: qty 20

## 2019-01-09 MED ORDER — MAGNESIUM HYDROXIDE 400 MG/5ML PO SUSP: 30.0000 mL | Freq: Every day | ORAL | Status: DC | PRN

## 2019-01-09 MED ORDER — BACITRACIN 50000 UNITS IM SOLR
INTRAMUSCULAR | Status: AC
  Filled 2019-01-09: qty 2

## 2019-01-09 MED ORDER — MIDAZOLAM HCL 2 MG/2ML IJ SOLN
INTRAMUSCULAR | Status: AC
  Filled 2019-01-09: qty 2

## 2019-01-09 MED ORDER — HYDROCHLOROTHIAZIDE 12.5 MG PO CAPS
12.5000 mg | ORAL_CAPSULE | Freq: Every day | ORAL | Status: DC
  Administered 2019-01-09 – 2019-01-11 (×3): 12.5 mg via ORAL
  Filled 2019-01-09 (×3): qty 1

## 2019-01-09 MED ORDER — BUPIVACAINE IN DEXTROSE 0.75-8.25 % IT SOLN
INTRATHECAL | Status: DC | PRN
  Administered 2019-01-09: 1.4 mL via INTRATHECAL

## 2019-01-09 MED ORDER — FLUVOXAMINE MALEATE 50 MG PO TABS
50.0000 mg | ORAL_TABLET | Freq: Every day | ORAL | Status: DC
  Administered 2019-01-10: 100 mg via ORAL
  Administered 2019-01-11: 50 mg via ORAL
  Filled 2019-01-09 (×3): qty 2

## 2019-01-09 MED ORDER — ACETAMINOPHEN 500 MG PO TABS: 500.0000 mg | ORAL_TABLET | Freq: Two times a day (BID) | ORAL | Status: DC | PRN

## 2019-01-09 MED ORDER — MENTHOL 3 MG MT LOZG
1.0000 | LOZENGE | OROMUCOSAL | Status: DC | PRN
  Filled 2019-01-09: qty 9

## 2019-01-09 MED ORDER — KETOROLAC TROMETHAMINE 15 MG/ML IJ SOLN
7.5000 mg | Freq: Four times a day (QID) | INTRAMUSCULAR | Status: AC
  Administered 2019-01-09 – 2019-01-10 (×4): 7.5 mg via INTRAVENOUS
  Filled 2019-01-09 (×4): qty 1

## 2019-01-09 MED ORDER — HYDROCODONE-ACETAMINOPHEN 7.5-325 MG PO TABS
1.0000 | ORAL_TABLET | ORAL | Status: DC | PRN
  Administered 2019-01-09: 2 via ORAL
  Administered 2019-01-10: 1 via ORAL
  Filled 2019-01-09: qty 2
  Filled 2019-01-09: qty 1

## 2019-01-09 MED ORDER — FENTANYL CITRATE (PF) 100 MCG/2ML IJ SOLN
25.0000 ug | INTRAMUSCULAR | Status: DC | PRN
  Administered 2019-01-09 (×2): 25 ug via INTRAVENOUS

## 2019-01-09 MED ORDER — GABAPENTIN 300 MG PO CAPS
300.0000 mg | ORAL_CAPSULE | Freq: Three times a day (TID) | ORAL | Status: DC
  Administered 2019-01-09 – 2019-01-11 (×6): 300 mg via ORAL
  Filled 2019-01-09 (×6): qty 1

## 2019-01-09 MED ORDER — PHENOL 1.4 % MT LIQD
1.0000 | OROMUCOSAL | Status: DC | PRN
  Filled 2019-01-09: qty 177

## 2019-01-09 MED ORDER — MORPHINE SULFATE (PF) 2 MG/ML IV SOLN: 0.5000 mg | INTRAVENOUS | Status: DC | PRN

## 2019-01-09 MED ORDER — PROPOFOL 10 MG/ML IV BOLUS
INTRAVENOUS | Status: DC | PRN
  Administered 2019-01-09: 50 mg via INTRAVENOUS

## 2019-01-09 MED ORDER — MIDAZOLAM HCL 5 MG/5ML IJ SOLN
INTRAMUSCULAR | Status: DC | PRN
  Administered 2019-01-09 (×2): 1 mg via INTRAVENOUS

## 2019-01-09 MED ORDER — FLUTICASONE PROPIONATE 50 MCG/ACT NA SUSP
1.0000 | Freq: Every day | NASAL | Status: DC | PRN
  Filled 2019-01-09: qty 16

## 2019-01-09 MED ORDER — PHENYLEPHRINE HCL 10 MG/ML IJ SOLN
INTRAMUSCULAR | Status: DC | PRN
  Administered 2019-01-09 (×2): 50 ug via INTRAVENOUS
  Administered 2019-01-09: 100 ug via INTRAVENOUS

## 2019-01-09 MED ORDER — ACETAMINOPHEN 500 MG PO TABS
ORAL_TABLET | ORAL | Status: AC
  Administered 2019-01-09: 1000 mg via ORAL
  Filled 2019-01-09: qty 2

## 2019-01-09 SURGICAL SUPPLY — 51 items
APL PRP STRL LF DISP 70% ISPRP (MISCELLANEOUS) ×1
BLADE SAGITTAL WIDE XTHICK NO (BLADE) ×3 IMPLANT
BRUSH SCRUB EZ  4% CHG (MISCELLANEOUS) ×2
BRUSH SCRUB EZ 4% CHG (MISCELLANEOUS) ×2 IMPLANT
CHLORAPREP W/TINT 26 (MISCELLANEOUS) ×3 IMPLANT
COVER HOLE (Hips) ×2 IMPLANT
COVER WAND RF STERILE (DRAPES) ×1 IMPLANT
CUP R3 50MM (Hips) ×2 IMPLANT
DRAPE C-ARM 42X72 X-RAY (DRAPES) ×3 IMPLANT
DRAPE SHEET LG 3/4 BI-LAMINATE (DRAPES) ×1 IMPLANT
DRAPE STERI IOBAN 125X83 (DRAPES) ×2 IMPLANT
DRESSING ALLEVYN LIFE SACRUM (GAUZE/BANDAGES/DRESSINGS) ×2 IMPLANT
DRSG AQUACEL AG ADV 3.5X10 (GAUZE/BANDAGES/DRESSINGS) ×2 IMPLANT
DRSG AQUACEL AG ADV 3.5X14 (GAUZE/BANDAGES/DRESSINGS) IMPLANT
ELECT BLADE 6.5 EXT (BLADE) ×3 IMPLANT
ELECT REM PT RETURN 9FT ADLT (ELECTROSURGICAL) ×3
ELECTRODE REM PT RTRN 9FT ADLT (ELECTROSURGICAL) ×1 IMPLANT
GAUZE PETRO XEROFOAM 1X8 (MISCELLANEOUS) ×2 IMPLANT
GLOVE INDICATOR 8.0 STRL GRN (GLOVE) ×3 IMPLANT
GLOVE SURG ORTHO 8.0 STRL STRW (GLOVE) ×6 IMPLANT
GOWN STRL REUS W/ TWL LRG LVL3 (GOWN DISPOSABLE) ×1 IMPLANT
GOWN STRL REUS W/ TWL XL LVL3 (GOWN DISPOSABLE) ×1 IMPLANT
GOWN STRL REUS W/TWL LRG LVL3 (GOWN DISPOSABLE) ×3
GOWN STRL REUS W/TWL XL LVL3 (GOWN DISPOSABLE) ×3
HEAD OXINIUM PLUS 0 32MM (Hips) ×2 IMPLANT
HOOD PEEL AWAY FLYTE STAYCOOL (MISCELLANEOUS) ×9 IMPLANT
IV NS 1000ML (IV SOLUTION) ×3
IV NS 1000ML BAXH (IV SOLUTION) ×1 IMPLANT
KIT PATIENT CARE HANA TABLE (KITS) ×3 IMPLANT
KIT TURNOVER CYSTO (KITS) ×3 IMPLANT
LINER 0 DEG 32X50MM (Hips) ×2 IMPLANT
MAT ABSORB  FLUID 56X50 GRAY (MISCELLANEOUS) ×2
MAT ABSORB FLUID 56X50 GRAY (MISCELLANEOUS) ×1 IMPLANT
NDL SAFETY ECLIPSE 18X1.5 (NEEDLE) ×2 IMPLANT
NDL SPNL 20GX3.5 QUINCKE YW (NEEDLE) ×1 IMPLANT
NEEDLE HYPO 18GX1.5 SHARP (NEEDLE) ×6
NEEDLE HYPO 22GX1.5 SAFETY (NEEDLE) ×3 IMPLANT
NEEDLE SPNL 20GX3.5 QUINCKE YW (NEEDLE) ×3 IMPLANT
PACK HIP PROSTHESIS (MISCELLANEOUS) ×3 IMPLANT
PADDING CAST BLEND 4X4 NS (MISCELLANEOUS) ×6 IMPLANT
PILLOW ABDUCTION MEDIUM (MISCELLANEOUS) ×3 IMPLANT
PULSAVAC PLUS IRRIG FAN TIP (DISPOSABLE) ×3
SCREW 6.5X25MM (Screw) ×2 IMPLANT
STAPLER SKIN PROX 35W (STAPLE) ×3 IMPLANT
STEM POLAR STD SZ4 COLLAR (Stent) ×2 IMPLANT
SUT BONE WAX W31G (SUTURE) ×3 IMPLANT
SUT DVC 2 QUILL PDO  T11 36X36 (SUTURE) ×2
SUT DVC 2 QUILL PDO T11 36X36 (SUTURE) ×1 IMPLANT
SUT VIC AB 2-0 CT1 18 (SUTURE) ×3 IMPLANT
SYR 20CC LL (SYRINGE) ×3 IMPLANT
TIP FAN IRRIG PULSAVAC PLUS (DISPOSABLE) ×1 IMPLANT

## 2019-01-09 NOTE — Anesthesia Post-op Follow-up Note (Signed)
Anesthesia QCDR form completed.        

## 2019-01-09 NOTE — H&P (Signed)
The patient has been re-examined, and the chart reviewed, and there have been no interval changes to the documented history and physical.  Plan a right total hip replacement today.  Anesthesia is not consulted regarding a peripheral nerve block for post-operative pain.  The risks, benefits, and alternatives have been discussed at length, and the patient is willing to proceed.     

## 2019-01-09 NOTE — Op Note (Addendum)
01/09/2019  3:31 PM  PATIENT:  Claudia Andersen   MRN: [id number]  PRE-OPERATIVE DIAGNOSIS:  Acetabular fracture right hip   POST-OPERATIVE DIAGNOSIS: Same  Procedure: Right Total Hip Replacement  Surgeon: Elyn Aquas. Harlow Mares, MD   Assist: Carlynn Spry, PA-C  Anesthesia: Spinal   EBL: 200 mL   Specimens: None   Drains: None   Components used: A size 4 Polarstem Smith [employer]hew, R3 size 50 mm shell, and a 32 mm +0 mm head   Findings: Severe degenerative changes were seen throughout the femoral head and acetabulum. A large osteochondral defect was noted along the superior lateral femoral head.  Medical Necessity: Patient had been treated conservatively for acetabular fracture on the right and compression fractures of the lumbosacral spine. Her back pain improved, however her hip pain has progressively worsened dramatically over the last 2 weeks causing unremitting pain and difficulty with all of her daily activities. She was therefore deemed medically necessary to proceed with surgical correction as waiting 4 weeks would cause significant harm. The increased risks due to the pandemic are discussed in detail with the patient and she is eager to proceed.   Description of the procedure in detail: After informed consent was obtained and the appropriate extremity marked in the pre-operative holding area, the patient was taken to the operating room and placed in the supine position on the fracture table. All pressure points were well padded and bilateral lower extremities were place in traction spars. The hip was prepped and draped in standard sterile fashion. A spinal anesthetic had been delivered by the anesthesia team. The skin and subcutaneous tissues were injected with a mixture of Marcaine with epinephrine for post-operative pain. A longitudinal incision approximately 10 cm in length was carried out from the anterior superior iliac spine to the greater trochanter. The tensor fascia was  divided and blunt dissection was taken down to the level of the joint capsule. The lateral circumflex vessels were cauterized. Deep retractors were placed and a portion of the anterior capsule was excised. Using fluoroscopy the neck cut was planned and carried out with a sagittal saw. The head was passed from the field with use of a corkscrew and hip skid. Deep retractors were placed along the acetabulum and the degenerative labrum and large osteophytes were removed with a Rongeur. The cup was sequentially reamed to a size 50 mm. The wound was irrigated and using fluoroscopy the size 50 mm cup was impacted in to anatomic position. A single screw was placed followed by a threaded hole cover. The final liner was impacted in to position. Attention was then turned to the proximal femur. The leg was placed in extension and external rotation. The canal was opened and sequentially broached to a size 4. The trial components were placed and the hip relocated. The components were found to be in good position using fluoroscopy. The hip was dislocated and the trial components removed. The final components were impacted in to position and the hip relocated. The final components were again check with fluoroscopy and found to be in good position. Hemostasis was achieved with electrocautery. The deep capsule was injected with Marcaine and epinephrine. The wound was irrigated with bacitracin laced normal saline and the tensor fascia closed with #2 Quill suture. The subcutaneous tissues were closed with 2-0 vicryl and staples for the skin. A sterile dressing was applied and an abduction pillow. Patient tolerated the procedure well and there were no apparent complication. Patient was taken to the recovery  room in good condition.   Kurtis Bushman, MD

## 2019-01-09 NOTE — Transfer of Care (Signed)
Immediate Anesthesia Transfer of Care Note  Patient: Claudia Andersen  Procedure(s) Performed: TOTAL HIP ARTHROPLASTY ANTERIOR APPROACH (Right )  Patient Location: PACU  Anesthesia Type:Spinal  Level of Consciousness: awake, alert  and oriented  Airway & Oxygen Therapy: Patient Spontanous Breathing and Patient connected to nasal cannula oxygen  Post-op Assessment: Report given to RN and Post -op Vital signs reviewed and stable  Post vital signs: Reviewed and stable  Last Vitals:  Vitals Value Taken Time  BP 106/67 01/09/2019  3:28 PM  Temp 36.2 C 01/09/2019  3:28 PM  Pulse 90 01/09/2019  3:28 PM  Resp 19 01/09/2019  3:28 PM  SpO2 99 % 01/09/2019  3:28 PM    Last Pain:  Vitals:   01/09/19 1528  TempSrc:   PainSc: 0-No pain      Patients Stated Pain Goal: 2 (75/44/92 0100)  Complications: No apparent anesthesia complications

## 2019-01-09 NOTE — Anesthesia Preprocedure Evaluation (Signed)
Anesthesia Evaluation  Patient identified by MRN, date of birth, ID band Patient awake    Reviewed: Allergy & Precautions, NPO status , Patient's Chart, lab work & pertinent test results  History of Anesthesia Complications Negative for: history of anesthetic complications  Airway Mallampati: II       Dental   Pulmonary neg sleep apnea, neg COPD, former smoker,           Cardiovascular (-) hypertension(-) Past MI and (-) CHF (-) dysrhythmias (-) Valvular Problems/Murmurs     Neuro/Psych neg Seizures Depression    GI/Hepatic Neg liver ROS, GERD  Medicated and Controlled,  Endo/Other  neg diabetes  Renal/GU negative Renal ROS     Musculoskeletal   Abdominal   Peds  Hematology   Anesthesia Other Findings   Reproductive/Obstetrics                             Anesthesia Physical Anesthesia Plan  ASA: II  Anesthesia Plan: Spinal   Post-op Pain Management:    Induction:   PONV Risk Score and Plan:   Airway Management Planned: Nasal Cannula  Additional Equipment:   Intra-op Plan:   Post-operative Plan:   Informed Consent: I have reviewed the patients History and Physical, chart, labs and discussed the procedure including the risks, benefits and alternatives for the proposed anesthesia with the patient or authorized representative who has indicated his/her understanding and acceptance.       Plan Discussed with:   Anesthesia Plan Comments:         Anesthesia Quick Evaluation

## 2019-01-09 NOTE — Anesthesia Procedure Notes (Signed)
Spinal  Patient location during procedure: OR End time: 01/09/2019 1:34 PM Staffing Anesthesiologist: Gunnar Fusi, MD Resident/CRNA: Jonna Clark, CRNA Performed: resident/CRNA  Preanesthetic Checklist Completed: patient identified, site marked, surgical consent, pre-op evaluation, timeout performed, IV checked, risks and benefits discussed and monitors and equipment checked Spinal Block Patient position: sitting Prep: Betadine Patient monitoring: heart rate, continuous pulse ox, blood pressure and cardiac monitor Approach: midline Location: L4-5 Injection technique: single-shot Needle Needle type: Whitacre and Introducer  Needle gauge: 24 G Needle length: 9 cm Assessment Sensory level: T8 Additional Notes Negative paresthesia. Negative blood return. Positive free-flowing CSF. Expiration date of kit checked and confirmed. Patient tolerated procedure well, without complications.

## 2019-01-10 ENCOUNTER — Encounter: Payer: Self-pay | Admitting: Orthopedic Surgery

## 2019-01-10 DIAGNOSIS — Z96649 Presence of unspecified artificial hip joint: Secondary | ICD-10-CM | POA: Insufficient documentation

## 2019-01-10 LAB — BASIC METABOLIC PANEL
Anion gap: 7 (ref 5–15)
BUN: 15 mg/dL (ref 8–23)
CO2: 24 mmol/L (ref 22–32)
Calcium: 8.1 mg/dL — ABNORMAL LOW (ref 8.9–10.3)
Chloride: 109 mmol/L (ref 98–111)
Creatinine, Ser: 0.92 mg/dL (ref 0.44–1.00)
GFR calc Af Amer: >60 mL/min (ref >60)
GFR calc non Af Amer: >60 mL/min (ref >60)
GLUCOSE: 140 mg/dL — AB (ref 70–99)
Potassium: 3.2 mmol/L — ABNORMAL LOW (ref 3.5–5.1)
Sodium: 140 mmol/L (ref 135–145)

## 2019-01-10 LAB — CBC
HCT: 28.9 % — ABNORMAL LOW (ref 36.0–46.0)
Hemoglobin: 9.6 g/dL — ABNORMAL LOW (ref 12.0–15.0)
MCH: 33.2 pg (ref 26.0–34.0)
MCHC: 33.2 g/dL (ref 30.0–36.0)
MCV: 100 fL (ref 80.0–100.0)
Platelets: 254 10*3/uL (ref 150–400)
RBC: 2.89 MIL/uL — ABNORMAL LOW (ref 3.87–5.11)
RDW: 12.6 % (ref 11.5–15.5)
WBC: 8.1 10*3/uL (ref 4.0–10.5)
nRBC: 0 % (ref 0.0–0.2)

## 2019-01-10 NOTE — Evaluation (Signed)
Physical Therapy Evaluation Patient Details Name: Claudia Andersen MRN: [id number] DOB: Jun 26, 1941 Today's Date: 01/10/2019   History of Present Illness  Pt underwent R THA after failed conservative management since Nov. Patient reports prior to increased hip/LBP she was walking multiple miles per day, but since Nov has been confined to power chair and using RW d/t increased pain. Reports ultimate goal is to return to walking without AD. Lives in [place] [facility] community alone, with "friends nearby to help at home".   Clinical Impression  Patient needing assistance for all transfers: minA STS, modA sit> supine, minA supine > sit; assistance for ambulation over 11ft with RW and chair pulled behind patient to control descent to chair following d/t pain and fatigue. Patient demonstrates good carry over of hand placement and safety cues, but is unable to physically perform any transfers or ambulation without assistance. PT and patient discussed DC option of rehab for safety and patient is very resistant. Patient with impairments in RLE strength, RLE ROM, RLE pain, coordination, balance, and activity tolerance. Limited in activities of transferring, prolonged walking/standing, lifting, and squatting; limiting participation in basic ADLs. Would benefit from skilled PT to address above deficits and promote optimal return to PLOF     Follow Up Recommendations SNF    Equipment Recommendations  Standard walker    Recommendations for Other Services       Precautions / Restrictions Precautions Precautions: Anterior Hip Restrictions Weight Bearing Restrictions: Yes RLE Weight Bearing: Weight bearing as tolerated      Mobility  Bed Mobility Overal bed mobility: Needs Assistance Bed Mobility: Sidelying to Sit;Supine to Sit   Sidelying to sit: Mod assist Supine to sit: Min guard     General bed mobility comments: Paitent able to complete supine > sit transfer with min gaurd and cuing for  safety with RLE precautions. Patient requiring modA for RLE mangement to return to bed  Transfers Overall transfer level: Needs assistance Equipment used: Rolling walker (2 wheeled) Transfers: Sit to/from Stand Sit to Stand: Min assist         General transfer comment: Patient unable to stand without assistance on first attempt. PT cued patient for proper hand placement and safety and minimally assisted patient to complete transfer  Ambulation/Gait Ambulation/Gait assistance: Mod assist Gait Distance (Feet): 3 Feet Assistive device: Rolling walker (2 wheeled) Gait Pattern/deviations: Wide base of support;Shuffle;Decreased stance time - right;Decreased stance time - left;Decreased weight shift to right;Step-to pattern;Trendelenburg;Antalgic;Trunk flexed   Gait velocity interpretation: <1.31 ft/sec, indicative of household ambulator General Gait Details: Patient only able to take shuffle steps less than 2 feet forward before needing PT to pull a chair behind to control sit. Able to take backward shuffle steps to return to bed min gaurd  Stairs            Wheelchair Mobility    Modified Rankin (Stroke Patients Only)       Balance Overall balance assessment: Needs assistance   Sitting balance-Leahy Scale: Fair       Standing balance-Leahy Scale: Poor                               Pertinent Vitals/Pain Pain Assessment: 0-10 Pain Score: 7  Pain Location: R hip Pain Descriptors / Indicators: Aching Pain Intervention(s): Limited activity within patient's tolerance;Monitored during session    Home Living Family/patient expects to be discharged to:: Private residence(Retirement community) Living Arrangements: Alone Available Help at  Discharge: Friend(s) Type of Home: Independent living facility Home Access: Level entry     Home Layout: One level Home Equipment: Grab bars - tub/shower;Shower seat;Electric scooter;Cane - single point;Walker - 2 wheels       Prior Function Level of Independence: Independent with assistive device(s)         Comments: Reports she has been using power scooter and RW since Nov d/t increased pain, prior was ind with mobility without AD     Hand Dominance   Dominant Hand: Right    Extremity/Trunk Assessment   Upper Extremity Assessment Upper Extremity Assessment: Generalized weakness    Lower Extremity Assessment Lower Extremity Assessment: Generalized weakness    Cervical / Trunk Assessment Cervical / Trunk Assessment: Kyphotic  Communication   Communication: No difficulties  Cognition Arousal/Alertness: Awake/alert Behavior During Therapy: WFL for tasks assessed/performed Overall Cognitive Status: Within Functional Limits for tasks assessed                                        General Comments      Exercises Total Joint Exercises Ankle Circles/Pumps: AROM(1 demo with handout of HEP ) Quad Sets: AROM(1 demo with handout of HEP ) Gluteal Sets: AROM(1 demo with handout of HEP ) Short Arc Quad: AROM;10 reps Heel Slides: AAROM(1 demo with handout of HEP; unable to complete AROM) Hip ABduction/ADduction: AROM;10 reps Straight Leg Raises: AAROM;10 reps(Unable to complete AROM) Long Arc Quad: AROM;10 reps Marching in Standing: AROM;Seated;10 reps Other Exercises Other Exercises: Patient able to complete therex with cuing for accuracy/muscle activiation with handout given and education on reps/sets and purpose of therex Other Exercises: transfer supine > sit > stand > sit > supine with minA from supine>sit with cuing for safety and precautions; minA for STS transfer following attempt ind, with cuing for hand placement and RW management which patient is able to demonstrate good carry over of; Patient able to take a few shuffle steps forward with PT assistance and cuing of how to utilize UE and RW to prevent full WB on RLE wto decrease pain, before patient needed a chair pulled  behind d/t not being able to navigate enviornemtn any further. Patient able to take some backwards steps following with controlled descent to bed; then required modA of RLE negotiation to return to supine.  Other Exercises: Education on D/C options with patient very resistant to SNF option despite PT expalaining purpose, safety, and importance,    Assessment/Plan    PT Assessment Patient needs continued PT services  PT Problem List Decreased strength;Decreased coordination;Decreased range of motion;Pain;Decreased activity tolerance;Decreased balance;Decreased mobility;Decreased knowledge of use of DME       PT Treatment Interventions DME instruction;Therapeutic exercise;Gait training;Balance training;Manual techniques;Stair training;Neuromuscular re-education;Functional mobility training;Therapeutic activities;Patient/family education    PT Goals (Current goals can be found in the Care Plan section)  Acute Rehab PT Goals Patient Stated Goal: Return to walking without AD PT Goal Formulation: With patient Time For Goal Achievement: 01/24/19 Potential to Achieve Goals: Fair    Frequency BID   Barriers to discharge Decreased caregiver support      Co-evaluation               AM-PAC PT "6 Clicks" Mobility  Outcome Measure Help needed turning from your back to your side while in a flat bed without using bedrails?: A Little Help needed moving from lying on your back to  sitting on the side of a flat bed without using bedrails?: A Little Help needed moving to and from a bed to a chair (including a wheelchair)?: A Lot Help needed standing up from a chair using your arms (e.g., wheelchair or bedside chair)?: A Lot Help needed to walk in hospital room?: Total Help needed climbing 3-5 steps with a railing? : Total 6 Click Score: 12    End of Session Equipment Utilized During Treatment: Gait belt Activity Tolerance: Patient limited by fatigue;Patient limited by pain Patient left: in  bed;with call bell/phone within reach;with bed alarm set Nurse Communication: (Communicated with case management DC recommendations) PT Visit Diagnosis: Unsteadiness on feet (R26.81);Other abnormalities of gait and mobility (R26.89);Muscle weakness (generalized) (M62.81);History of falling (Z91.81);Pain;Difficulty in walking, not elsewhere classified (R26.2) Pain - Right/Left: Right Pain - part of body: Hip    Time: 1005-1037 PT Time Calculation (min) (ACUTE ONLY): 32 min   Charges:   PT Evaluation $PT Eval Moderate Complexity: 1 Mod PT Treatments $Therapeutic Exercise: 8-22 mins $Therapeutic Activity: 8-22 mins        Shelton Silvas PT, DPT  Shelton Silvas 01/10/2019, 11:17 AM

## 2019-01-10 NOTE — TOC Progression Note (Signed)
Transition of Care [facility]) - Progression Note    Patient Details  Name: Claudia Andersen MRN: [id number] Date of Birth: [birth year]  Transition [facility] CM/SW [place], RN Phone Number: 01/10/2019, 11:26 AM  Clinical Narrative:     Called SW and notified that the patient is recommended to go to SNF       Expected Discharge Plan and Services                                     Social Determinants of Health (SDOH) Interventions    Readmission Risk Interventions No flowsheet data found.

## 2019-01-10 NOTE — Progress Notes (Signed)
Physical Therapy Treatment Patient Details Name: Claudia Andersen MRN: [id number] DOB: 1940/11/05 Today's Date: 01/10/2019    History of Present Illness Pt underwent R THA after failed conservative management since Nov. Patient reports prior to increased hip/LBP she was walking multiple miles per day, but since Nov has been confined to power chair and using RW d/t increased pain. Reports ultimate goal is to return to walking without AD. Lives in [place] [facility] community alone, with "friends nearby to help at home".     PT Comments    Patient with improvements made this afternoon in independence with mobility, requiring only HHA for supine >sit, minA for initiation of STS with patient able to remain standing ind. Patient is able to ambulate around room CGA with minA for RW negotiation turning around. Patient is eager to continue working with PT and continue improving so that she can demonstrate safety and ind in her home setting. Would benefit from skilled PT to address above deficits and promote optimal return to PLOF     Follow Up Recommendations  SNF     Equipment Recommendations  Standard walker    Recommendations for Other Services       Precautions / Restrictions Precautions Precautions: Anterior Hip Restrictions Weight Bearing Restrictions: Yes RLE Weight Bearing: Weight bearing as tolerated    Mobility  Bed Mobility Overal bed mobility: Needs Assistance Bed Mobility: Sidelying to Sit;Supine to Sit   Sidelying to sit: Min assist Supine to sit: Min assist     General bed mobility comments: This afternoon patient is able to complete supine <> sit transfers with HHA and cuing for encouragement, with patient able to make more attempt on her own  Transfers Overall transfer level: Needs assistance Equipment used: Rolling walker (2 wheeled) Transfers: Sit to/from Stand Sit to Stand: Min assist         General transfer comment: able to complete transfer with cuing  initially for set up and hand placement, with good carry over following, minA for initiation  Ambulation/Gait Ambulation/Gait assistance: Min assist Gait Distance (Feet): 20 Feet Assistive device: Rolling walker (2 wheeled) Gait Pattern/deviations: Wide base of support;Shuffle;Decreased stance time - right;Decreased stance time - left;Decreased weight shift to right;Step-to pattern;Trunk flexed;Trendelenburg;Antalgic Gait velocity: decreased   General Gait Details: Throughout ambulation in room patient is able to increase stride length with step through gait. Needs PT assistance for RW management with turning around   Stairs             Wheelchair Mobility    Modified Rankin (Stroke Patients Only)       Balance Overall balance assessment: Needs assistance   Sitting balance-Leahy Scale: Fair       Standing balance-Leahy Scale: Fair                              Cognition Arousal/Alertness: Awake/alert Behavior During Therapy: WFL for tasks assessed/performed Overall Cognitive Status: Within Functional Limits for tasks assessed                                        Exercises Total Joint Exercises Heel Slides: AAROM;10 reps;AROM(AAROM initially progressed to AROM) Hip ABduction/ADduction: AROM;AAROM;10 reps(AAROM initially progressed to AROM) Straight Leg Raises: AAROM;10 reps Long Arc Quad: AROM;10 reps(75% full AROM) Marching in Standing: AROM;Seated;10 reps(75% full AROM) Other Exercises Other Exercises: Patient able  to demonstrate increased ind with supine <> sit transfer and follow cuing for technique/hand placement. Patient requiring cuing for set up for STS transfer (hand placement) with minA for initiation and good carry over following. This afternoon patient is able to increase ambulation to 65ft around room and increase step length with cuing to small step through pattern, and minA for RW management turning around despite PT cuing  for techniques. Patient continues to have difficulty with lowering from stand, demonstrates proper technique with eccentric control, but reports she is worried she will fall.     General Comments        Pertinent Vitals/Pain Pain Assessment: No/denies pain Pain Score: 7  Pain Location: R hip Pain Descriptors / Indicators: Aching Pain Intervention(s): Limited activity within patient's tolerance;Monitored during session;Patient requesting pain meds-RN notified    Home Living Family/patient expects to be discharged to:: Private residence Living Arrangements: Alone Available Help at Discharge: Friend(s) Type of Home: Independent living facility Home Access: Level entry   Home Layout: One [place];Shower seat;Electric scooter;Cane - single point;Walker - 2 wheels      Prior Function Level of Independence: Independent with assistive device(s)      Comments: Reports she has been using power scooter and RW since Nov d/t increased pain, prior was ind with mobility without AD   PT Goals (current goals can now be found in the care plan section) Acute Rehab PT Goals Patient Stated Goal: Return to walking without AD PT Goal Formulation: With patient Time For Goal Achievement: 01/24/19 Potential to Achieve Goals: Fair    Frequency    BID      PT Plan      Co-evaluation              AM-PAC PT "6 Clicks" Mobility   Outcome Measure  Help needed turning from your back to your side while in a flat bed without using bedrails?: A Little Help needed moving from lying on your back to sitting on the side of a flat bed without using bedrails?: A Little Help needed moving to and from a bed to a chair (including a wheelchair)?: A Little Help needed standing up from a chair using your arms (e.g., wheelchair or bedside chair)?: A Little Help needed to walk in hospital room?: A Little Help needed climbing 3-5 steps with a railing? : Total 6 Click  Score: 16    End of Session Equipment Utilized During Treatment: Gait belt Activity Tolerance: Patient limited by pain Patient left: in bed;with call bell/phone within reach;with bed alarm set Nurse Communication: Mobility status;Patient requests pain meds PT Visit Diagnosis: Unsteadiness on feet (R26.81);Other abnormalities of gait and mobility (R26.89);Muscle weakness (generalized) (M62.81);History of falling (Z91.81);Pain;Difficulty in walking, not elsewhere classified (R26.2) Pain - Right/Left: Right Pain - part of body: Hip     Time: 3474-2595 PT Time Calculation (min) (ACUTE ONLY): 24 min  Charges:  $Therapeutic Exercise: 8-22 mins $Therapeutic Activity: 8-22 mins                     Shelton Silvas PT, DPT   Shelton Silvas 01/10/2019, 3:27 PM

## 2019-01-10 NOTE — Anesthesia Postprocedure Evaluation (Signed)
Anesthesia Post Note  Patient: Claudia Andersen  Procedure(s) Performed: TOTAL HIP ARTHROPLASTY ANTERIOR APPROACH (Right )  Patient location during evaluation: Nursing Unit Anesthesia Type: Spinal Level of consciousness: oriented and awake and alert Pain management: pain level controlled Vital Signs Assessment: post-procedure vital signs reviewed and stable Respiratory status: spontaneous breathing and respiratory function stable Cardiovascular status: blood pressure returned to baseline and stable Postop Assessment: no headache, no backache, no apparent nausea or vomiting and patient able to bend at knees Anesthetic complications: no     Last Vitals:  Vitals:   01/09/19 2326 01/10/19 0320  BP: (!) 125/55 (!) 113/51  Pulse: 97 91  Resp: 15 18  Temp: 37.1 C 36.7 C  SpO2: 93% 95%    Last Pain:  Vitals:   01/10/19 0735  TempSrc:   PainSc: 0-No pain                 Rolla Plate P

## 2019-01-10 NOTE — TOC Initial Note (Signed)
Transition of Care [facility]) - Initial/Assessment Note    Patient Details  Name: Claudia Andersen MRN: [id number] Date of Birth: 07/27/1941  Transition of Care [facility]) CM/SW Contact:    Ross Ludwig, LCSW Phone Number: 01/10/2019, 6:08 PM  Clinical Narrative:                Patient is a 78 year old female who lives at [facility] with her dog, and friend.  Patient states she has a daughter who lives close by and is able to visit her daily.  Patient reports that she does not want to go to health care side of [facility].  CSW inquired what supports she has in place, she said she has a walker, wheelchair, bedside commode, and cain at home.  Patient states she does not want to SNF.  CSW advised her what the differences are between going home with home health or going to SNF, patient acknowledged differences and insisted that she still goes home.  Patient is planning to return back home with home health.   Expected Discharge Plan: [place] Barriers to Discharge: Continued Medical Work up   Patient Goals and CMS Choice Patient states their goals for this hospitalization and ongoing recovery are:: Patient plans to return back to [facility], she does not want to go to SNF. CMS Medicare.gov Compare Post Acute Care list provided to:: Patient Choice offered to / list presented to : Patient  Expected Discharge Plan and Services Expected Discharge Plan: [place] In-house Referral: Clinical Social Work Discharge Planning Services: CM Consult Post Acute Care Choice: [place], Resumption of Svcs/PTA Provider Living arrangements for the past 2 months: Independent Living Facility([facility])                          Prior Living Arrangements/Services Living arrangements for the past 2 months: Independent Living Facility([facility]) Lives with:: Self Patient language and need for interpreter reviewed:: No Do you feel safe going back to the place  where you live?: Yes      Need for Family Participation in Patient Care: No (Comment) Care giver support system in place?: Yes (comment)   Criminal Activity/Legal Involvement Pertinent to Current Situation/Hospitalization: No - Comment as needed  Activities of Daily Living Home Assistive Devices/Equipment: Cane (specify quad or straight), Walker (specify type), Other (Comment)(Scooter) ADL Screening (condition at time of admission) Patient's cognitive ability adequate to safely complete daily activities?: Yes Is the patient deaf or have difficulty hearing?: No Does the patient have difficulty seeing, even when wearing glasses/contacts?: No Does the patient have difficulty concentrating, remembering, or making decisions?: No Patient able to express need for assistance with ADLs?: Yes Does the patient have difficulty dressing or bathing?: No Independently performs ADLs?: No Communication: Needs assistance Is this a change from baseline?: Change from baseline, expected to last <3 days Dressing (OT): Needs assistance Is this a change from baseline?: Change from baseline, expected to last <3days Grooming: Needs assistance Is this a change from baseline?: Change from baseline, expected to last <3 days Feeding: Independent Bathing: Needs assistance Is this a change from baseline?: Change from baseline, expected to last <3 days Toileting: Needs assistance Is this a change from baseline?: Change from baseline, expected to last <3 days In/Out Bed: Needs assistance Is this a change from baseline?: Change from baseline, expected to last <3 days Walks in Home: Independent Does the patient  have difficulty walking or climbing stairs?: Yes Weakness of Legs: Right(fracture) Weakness of Arms/Hands: None  Permission Sought/Granted Permission sought to share information with : Case Manager, Customer service manager Permission granted to share information with : Yes, Verbal Permission  Granted  Share Information with NAME: Rebeca Alert Daughter   [phone]   Permission granted to share info w AGENCY: [facility]        Emotional Assessment Appearance:: Appears stated age   Affect (typically observed): Accepting, Stable, Appropriate Orientation: : Oriented to Self, Oriented to Place, Oriented to  Time, Oriented to Situation Alcohol / Substance Use: Not Applicable Psych Involvement: No (comment)  Admission diagnosis:  HIP FRACTURE Patient Active Problem List   Diagnosis Date Noted  . Acetabular fracture ([place]) 01/09/2019  . Preoperative evaluation to rule out surgical contraindication 11/23/2018  . Need for shingles vaccine 11/16/2018  . Osteoporosis 10/26/2018  . Leg edema 10/26/2018  . Carotid artery stenosis 10/26/2018  . History of pulmonary embolism 10/26/2018  . History of DVT of lower extremity 10/26/2018  . Osteoarthritis 10/26/2018  . DDD (degenerative disc disease), 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/2018  . Screen for colon cancer 07/31/2017  . GERD (gastroesophageal reflux disease) 07/31/2017  . Depression, major, single episode, complete remission ([place]) 07/31/2017  . Insomnia 07/31/2017   PCP:  Burnard Hawthorne, FNP Pharmacy:   [facility], [place] [address] Phone: [phone] Fax: [phone]     Social Determinants of Health (SDOH) Interventions    Readmission Risk Interventions No flowsheet data found.

## 2019-01-10 NOTE — Progress Notes (Signed)
Subjective:  Patient reports pain as moderate.    Objective:   VITALS:   Vitals:   01/09/19 2154 01/09/19 2326 01/10/19 0320 01/10/19 0818  BP: 140/75 (!) 125/55 (!) 113/51 (!) 118/51  Pulse: 97 97 91 79  Resp: 19 15 18    Temp: 99 F (37.2 C) 98.7 F (37.1 C) 98.1 F (36.7 C) 98.1 F (36.7 C)  TempSrc: Oral Oral Oral Oral  SpO2: 97% 93% 95% 98%  Weight:      Height:        PHYSICAL EXAM:  Neurovascular intact Incision: dressing C/D/I Compartment soft  LABS  Results for orders placed or performed during the hospital encounter of 01/09/19 (from the past 24 hour(s))  CBC with Differential/Platelet     Status: Abnormal   Collection Time: 01/09/19  8:32 AM  Result Value Ref Range   WBC 8.6 4.0 - 10.5 K/uL   RBC 3.77 (L) 3.87 - 5.11 MIL/uL   Hemoglobin 12.3 12.0 - 15.0 g/dL   HCT 37.2 36.0 - 46.0 %   MCV 98.7 80.0 - 100.0 fL   MCH 32.6 26.0 - 34.0 pg   MCHC 33.1 30.0 - 36.0 g/dL   RDW 12.7 11.5 - 15.5 %   Platelets 312 150 - 400 K/uL   nRBC 0.0 0.0 - 0.2 %   Neutrophils Relative % 67 %   Neutro Abs 5.8 1.7 - 7.7 K/uL   Lymphocytes Relative 17 %   Lymphs Abs 1.5 0.7 - 4.0 K/uL   Monocytes Relative 11 %   Monocytes Absolute 1.0 0.1 - 1.0 K/uL   Eosinophils Relative 3 %   Eosinophils Absolute 0.2 0.0 - 0.5 K/uL   Basophils Relative 1 %   Basophils Absolute 0.1 0.0 - 0.1 K/uL   Immature Granulocytes 1 %   Abs Immature Granulocytes 0.04 0.00 - 0.07 K/uL  Type and screen [facility]     Status: None   Collection Time: 01/09/19  8:32 AM  Result Value Ref Range   ABO/RH(D) A POS    Antibody Screen NEG    Sample Expiration      01/12/2019 Performed at [facility], [address] [place], [place] [address]   ABO/Rh     Status: None   Collection Time: 01/09/19  9:27 AM  Result Value Ref Range   ABO/RH(D)      A POS Performed at [facility], [address] [place], [place] [address]   CBC     Status: Abnormal   Collection Time: 01/10/19  2:40 AM  Result Value Ref Range   WBC 8.1 4.0 - 10.5 K/uL   RBC 2.89 (L) 3.87 - 5.11 MIL/uL   Hemoglobin 9.6 (L) 12.0 - 15.0 g/dL   HCT 28.9 (L) 36.0 - 46.0 %   MCV 100.0 80.0 - 100.0 fL   MCH 33.2 26.0 - 34.0 pg   MCHC 33.2 30.0 - 36.0 g/dL   RDW 12.6 11.5 - 15.5 %   Platelets 254 150 - 400 K/uL   nRBC 0.0 0.0 - 0.2 %  Basic metabolic panel     Status: Abnormal   Collection Time: 01/10/19  2:40 AM  Result Value Ref Range   Sodium 140 135 - 145 mmol/L   Potassium 3.2 (L) 3.5 - 5.1 mmol/L   Chloride 109 98 - 111 mmol/L   CO2 24 22 - 32 mmol/L   Glucose, Bld 140 (H) 70 - 99 mg/dL   BUN 15 8 -  23 mg/dL   Creatinine, Ser 0.92 0.44 - 1.00 mg/dL   Calcium 8.1 (L) 8.9 - 10.3 mg/dL   GFR calc non Af Amer >60 >60 mL/min   GFR calc Af Amer >60 >60 mL/min   Anion gap 7 5 - 15    Dg Pelvis Portable  Result Date: 01/09/2019 CLINICAL DATA:  Status post right hip replacement EXAM: PORTABLE PELVIS 1-2 VIEWS COMPARISON:  10/26/2018 MRI of the pelvis FINDINGS: Right hip replacement is now seen in satisfactory position. Irregularity along the superior aspect of the acetabulum is noted consistent with the previously seen fracture IMPRESSION: Status post right hip replacement. Electronically Signed   By: Inez Catalina M.D.   On: 01/09/2019 15:51   Dg Hip Operative Unilat W Or W/o Pelvis Right  Result Date: 01/09/2019 CLINICAL DATA:  Right hip replacement EXAM: OPERATIVE RIGHT HIP WITH PELVIS COMPARISON:  None. FLUOROSCOPY TIME:  Radiation Exposure Index (as provided by the fluoroscopic device): 2.86 mGy If the device does not provide the exposure index: Fluoroscopy Time:  16 seconds Number of Acquired Images:  6 FINDINGS: Initial images demonstrate a an acetabular reading device. Subsequently the right hip prosthesis is placed in satisfactory position. No acute abnormality is noted. IMPRESSION: Right hip replacement. Electronically Signed   By: Inez Catalina M.D.   On:  01/09/2019 15:16    Assessment/Plan: 1 Day Post-Op   Active Problems:   Acetabular fracture ([place])   Up with therapy Discharge home once clears PT Aspirin for DVT prophylaxis  Lovell Sheehan , MD 01/10/2019, 8:27 AM

## 2019-01-11 LAB — CBC
HEMATOCRIT: 29.9 % — AB (ref 36.0–46.0)
HEMOGLOBIN: 9.8 g/dL — AB (ref 12.0–15.0)
MCH: 32.5 pg (ref 26.0–34.0)
MCHC: 32.8 g/dL (ref 30.0–36.0)
MCV: 99 fL (ref 80.0–100.0)
Platelets: 243 10*3/uL (ref 150–400)
RBC: 3.02 MIL/uL — ABNORMAL LOW (ref 3.87–5.11)
RDW: 12.9 % (ref 11.5–15.5)
WBC: 10.6 10*3/uL — ABNORMAL HIGH (ref 4.0–10.5)
nRBC: 0 % (ref 0.0–0.2)

## 2019-01-11 LAB — SURGICAL PATHOLOGY

## 2019-01-11 MED ORDER — HYDROCODONE-ACETAMINOPHEN 7.5-325 MG PO TABS: 1.0000 | ORAL_TABLET | ORAL | 0 refills | Status: DC | PRN

## 2019-01-11 MED ORDER — DOCUSATE SODIUM 100 MG PO CAPS: 100.0000 mg | ORAL_CAPSULE | Freq: Two times a day (BID) | ORAL | 0 refills | Status: DC

## 2019-01-11 MED ORDER — ASPIRIN 81 MG PO CHEW: 81.0000 mg | CHEWABLE_TABLET | Freq: Two times a day (BID) | ORAL | 0 refills | Status: DC

## 2019-01-11 NOTE — Progress Notes (Signed)
Pt ready for discharge home today per MD. Patient assessment unchanged from this morning. Reviewed discharge instructions and prescriptions with pt; all questions answered and pt verbalized understanding. Pt is waiting on her friend to pick her up.   Ethelda Chick

## 2019-01-11 NOTE — Discharge Summary (Signed)
Physician Discharge Summary  Patient ID: Claudia Andersen MRN: [id number] DOB/AGE: [birth year] 77 y.o.  Admit date: 01/09/2019 Discharge date: 01/11/2019  Admission Diagnoses:  HIP FRACTURE <principal problem not specified>  Discharge Diagnoses:  HIP FRACTURE Active Problems:   Acetabular fracture [facility])   Past Medical History:  Diagnosis Date  . Ankle fracture, bimalleolar, closed, right, initial encounter 10/26/2018   d/t fall 10/26/18  . Arthritis   . Back abrasion    CRUSHED VERTEBRAE  . Closed right radial fracture 10/26/2018   s/p a fall  . Compression of lumbar vertebra ([place])    L1 and L4 (from fall November 05, 2018)  . Depression   . Gallstones   . GERD (gastroesophageal reflux disease)   . Migraines   . Pulmonary emboli ([facility])    history of emboli    Surgeries: Procedure(s): TOTAL HIP ARTHROPLASTY ANTERIOR APPROACH on 01/09/2019   Consultants (if any):   Discharged Condition: Improved  Hospital Course: Claudia Andersen is an 78 y.o. female who was admitted 01/09/2019 with a diagnosis of  HIP FRACTURE <principal problem not specified> and went to the operating room on 01/09/2019 and underwent the above named procedures.    She was given perioperative antibiotics:  Anti-infectives (From admission, onward)   Start     Dose/Rate Route Frequency Ordered Stop   01/09/19 1700  ceFAZolin (ANCEF) IVPB 1 g/50 mL premix     1 g 100 mL/hr over 30 Minutes Intravenous Every 6 hours 01/09/19 1643 01/10/19 0008   01/09/19 1431  50,000 units bacitracin in 0.9% normal saline 250 mL irrigation  Status:  Discontinued       As needed 01/09/19 1431 01/09/19 1524   01/09/19 0800  ceFAZolin (ANCEF) 2-4 GM/100ML-% IVPB    Note to Pharmacy:  Sylvester Harder   : cabinet override      01/09/19 0800 01/09/19 1355   01/09/19 0600  ceFAZolin (ANCEF) IVPB 2g/100 mL premix     2 g 200 mL/hr over 30 Minutes Intravenous On call to O.R. 01/08/19 2230 01/09/19 1425    .  She was given sequential  compression devices, early ambulation, and aspirin for DVT prophylaxis.  She benefited maximally from the hospital stay and there were no complications.    Recent vital signs:  Vitals:   01/11/19 0300 01/11/19 0741  BP:  (!) 132/58  Pulse:  87  Resp:  16  Temp: 99 F (37.2 C) 98.6 F (37 C)  SpO2:  95%    Recent laboratory studies:  Lab Results  Component Value Date   HGB 9.8 (L) 01/11/2019   HGB 9.6 (L) 01/10/2019   HGB 12.3 01/09/2019   Lab Results  Component Value Date   WBC 10.6 (H) 01/11/2019   PLT 243 01/11/2019   Lab Results  Component Value Date   INR 1.1 (H) 11/26/2018   Lab Results  Component Value Date   NA 140 01/10/2019   K 3.2 (L) 01/10/2019   CL 109 01/10/2019   CO2 24 01/10/2019   BUN 15 01/10/2019   CREATININE 0.92 01/10/2019   GLUCOSE 140 (H) 01/10/2019    Discharge Medications:   Allergies as of 01/11/2019   No Known Allergies     Medication List    STOP taking these medications   aspirin EC 81 MG tablet Replaced by:  aspirin 81 MG chewable tablet   HYDROcodone-acetaminophen 10-325 MG tablet Commonly known as:  NORCO Replaced by:  HYDROcodone-acetaminophen 7.5-325 MG tablet  TAKE these medications   acetaminophen 500 MG tablet Commonly known as:  TYLENOL Take 500 mg by mouth 2 (two) times daily as needed (PAIN.).   aspirin 81 MG chewable tablet Chew 1 tablet (81 mg total) by mouth 2 (two) times daily. Replaces:  aspirin EC 81 MG tablet   atorvastatin 10 MG tablet Commonly known as:  LIPITOR Take 1 tablet (10 mg total) by mouth daily. What changed:  when to take this   Biotin 2500 MCG Caps Take 2,500 mcg by mouth daily.   CALCIUM 600 + D PO Take 2 tablets by mouth daily.   docusate sodium 100 MG capsule Commonly known as:  COLACE Take 1 capsule (100 mg total) by mouth 2 (two) times daily.   FLONASE SENSIMIST NA Place 1 spray into both nostrils daily as needed (allergies).   fluvoxaMINE 100 MG tablet Commonly  known as:  LUVOX Take 0.5-1 tablets (50-100 mg total) by mouth daily. What changed:  how much to take   gabapentin 300 MG capsule Commonly known as:  NEURONTIN Take 1 capsule (300 mg total) by mouth at bedtime.   hydrochlorothiazide 12.5 MG capsule Commonly known as:  MICROZIDE Take 1 capsule (12.5 mg total) by mouth daily. What changed:  when to take this   HYDROcodone-acetaminophen 7.5-325 MG tablet Commonly known as:  NORCO Take 1-2 tablets by mouth every 4 (four) hours as needed for severe pain (pain score 7-10). Replaces:  HYDROcodone-acetaminophen 10-325 MG tablet   pantoprazole 40 MG tablet Commonly known as:  PROTONIX Take 1 tablet (40 mg total) by mouth 2 (two) times daily.   traZODone 50 MG tablet Commonly known as:  DESYREL Take 50-100 mg by mouth at bedtime as needed for sleep.       Diagnostic Studies: Dg Pelvis Portable  Result Date: 01/09/2019 CLINICAL DATA:  Status post right hip replacement EXAM: PORTABLE PELVIS 1-2 VIEWS COMPARISON:  10/26/2018 MRI of the pelvis FINDINGS: Right hip replacement is now seen in satisfactory position. Irregularity along the superior aspect of the acetabulum is noted consistent with the previously seen fracture IMPRESSION: Status post right hip replacement. Electronically Signed   By: Inez Catalina M.D.   On: 01/09/2019 15:51   Dg Hip Operative Unilat W Or W/o Pelvis Right  Result Date: 01/09/2019 CLINICAL DATA:  Right hip replacement EXAM: OPERATIVE RIGHT HIP WITH PELVIS COMPARISON:  None. FLUOROSCOPY TIME:  Radiation Exposure Index (as provided by the fluoroscopic device): 2.86 mGy If the device does not provide the exposure index: Fluoroscopy Time:  16 seconds Number of Acquired Images:  6 FINDINGS: Initial images demonstrate a an acetabular reading device. Subsequently the right hip prosthesis is placed in satisfactory position. No acute abnormality is noted. IMPRESSION: Right hip replacement. Electronically Signed   By: Inez Catalina M.D.   On: 01/09/2019 15:16    Disposition: Discharge disposition: 01-Home or Self Care      Plan home health services.      Signed: Lovell Sheehan ,MD 01/11/2019, 9:44 AM

## 2019-01-11 NOTE — TOC Transition Note (Signed)
Transition of Care [facility]) - CM/SW Discharge Note   Patient Details  Name: TYARRA NOLTON MRN: [id number] Date of Birth: 03/27/1941  [facility]) CM/SW Contact:  Su Hilt, RN Phone Number: 01/11/2019, 11:19 AM   Clinical Narrative:     Patient to DC home with Home health thru [facility], [place] talked to the patient yesterday with [facility]  Patient has transportation thru her friend.  Final next level of care: [place] Barriers to Discharge: Barriers Resolved   Patient Goals and CMS Choice Patient states their goals for this hospitalization and ongoing recovery are:: Patient plans to return back to [facility], she does not want to go to SNF. CMS Medicare.gov Compare Post Acute Care list provided to:: Patient Choice offered to / list presented to : Patient  Discharge Placement                       Discharge Plan and Services In-house Referral: Clinical Social Work Discharge Planning Services: CM Consult Post Acute Care Choice: Home Health, Resumption of Svcs/PTA Provider                    Social Determinants of Health (SDOH) Interventions     Readmission Risk Interventions No flowsheet data found.

## 2019-01-11 NOTE — Discharge Instructions (Signed)

## 2019-01-11 NOTE — TOC Progression Note (Signed)
Transition of Care [facility]) - Progression Note    Patient Details  Name: JANI MORONTA MRN: [id number] Date of Birth: [birth year]  Transition of Care [facility]) CM/SW Contact  Su Hilt, RN Phone Number: 01/11/2019, 11:16 AM  Clinical Narrative:     Patient has requested [facility], she stated that [place] called her yesterday after getting the referral form Dr. Harlow Mares.  She does not need equipment and has all the needed DME at home,  She does not want [facility] to pick her up and stated that her friend Luiz Ochoa will pick her up, She wants to use Home health and not therapy from [facility].  [facility] set up and patient ready for DC  Expected Discharge Plan: [place] Barriers to Discharge: Continued Medical Work up  Expected Discharge Plan and Services Expected Discharge Plan: [place] In-house Referral: Clinical Social Work Discharge Planning Services: CM Consult Post Acute Care Choice: Home Health, Resumption of Svcs/PTA Provider Living arrangements for the past 2 months: Independent Living Facility([facility]) Expected Discharge Date: 01/11/19                         Social Determinants of Health (SDOH) Interventions    Readmission Risk Interventions No flowsheet data found.

## 2019-01-11 NOTE — Progress Notes (Signed)
Physical Therapy Treatment Patient Details Name: Claudia Andersen MRN: [id number] DOB: [birth year] Today's Date: 01/11/2019    History of Present Illness Pt underwent R THA after failed conservative management since Nov. Patient reports prior to increased hip/LBP she was walking multiple miles per day, but since Nov has been confined to power chair and using RW d/t increased pain. Reports ultimate goal is to return to walking without AD. Lives in [place] [facility] alone, with "friends nearby to help at home".     PT Comments    Patient in bed upon PT arrival, agreeable to PT. Performed bed therex with AAROM to RLE due to pain and limited self mobility of RLE.  Patient required assistance for RLE to transition to sitting EOB. Physician entered room upon sitting EOB and informed patient that they will be going home today. Upon standing weight shift for weight acceptance performed x2 minutes. Patient ambulated 15 ft with CGA and verbal cueing for placement of RW. Patient then transferred onto bed side commode where she had diahrrea. Patient returned to bed with increased velocity of ambulation requiring one standing rest break to catch her breath. Current POC continues to be appropriate.    Follow Up Recommendations  SNF     Equipment Recommendations  Standard walker    Recommendations for Other Services       Precautions / Restrictions Precautions Precautions: Anterior Hip Restrictions Weight Bearing Restrictions: Yes RLE Weight Bearing: Weight bearing as tolerated    Mobility  Bed Mobility Overal bed mobility: Needs Assistance Bed Mobility: Sidelying to Sit;Supine to Sit   Sidelying to sit: Min assist Supine to sit: Min assist     General bed mobility comments: This afternoon patient is able to complete supine <> sit transfers with HHA and cuing for encouragement, needs assistance with RLE onto/off bed, , with patient able to make more attempt on her  own  Transfers Overall transfer level: Modified independent Equipment used: Rolling walker (2 wheeled) Transfers: Sit to/from Stand Sit to Stand: Min guard         General transfer comment: able to complete transfer with cuing initially for set up and hand placement, with good carry over, more challenging from lower position.   Ambulation/Gait Ambulation/Gait assistance: Min assist;Min guard Gait Distance (Feet): 30 Feet(one seated rest break on bed side commode) Assistive device: Rolling walker (2 wheeled) Gait Pattern/deviations: Wide base of support;Shuffle;Decreased stance time - right;Decreased stance time - left;Decreased weight shift to right;Step-to pattern;Trunk flexed;Trendelenburg;Antalgic Gait velocity: decreased   General Gait Details: Throughout ambulation in room patient is able to increase stride length with step through gait. Improved mechanics with backwards ambulation.    Stairs             Wheelchair Mobility    Modified Rankin (Stroke Patients Only)       Balance Overall balance assessment: Needs assistance   Sitting balance-Leahy Scale: Fair   Postural control: Left lateral lean   Standing balance-Leahy Scale: Fair                              Cognition Arousal/Alertness: Awake/alert Behavior During Therapy: WFL for tasks assessed/performed Overall Cognitive Status: Within Functional Limits for tasks assessed  Exercises Total Joint Exercises Ankle Circles/Pumps: AROM;Both;10 reps Gluteal Sets: AROM;Both;10 reps(3 second holds) Heel Slides: AAROM;Right;10 reps;Supine;AROM;Left Hip ABduction/ADduction: AROM;10 reps;Left;AAROM;Right;Supine Marching in Standing: AROM;Seated;10 reps Other Exercises Other Exercises: Ambulate 15 ft to bedside commode and then back with shuffle step. Improving velocity with prolonged steps. Patient ambulates backwards at greater velocity.   Other Exercises: Transfer supine<>sit <>stand. Min A for supine<>sit with cueing and safety precautions. STS from high and low seat with set up and cueing for hand placement.     General Comments        Pertinent Vitals/Pain Pain Assessment: 0-10 Pain Score: 2  Pain Location: R hip  Pain Descriptors / Indicators: Aching Pain Intervention(s): Monitored during session;Ice applied    Home Living                      Prior Function            PT Goals (current goals can now be found in the care plan section) Acute Rehab PT Goals Patient Stated Goal: Return to walking without AD PT Goal Formulation: With patient Time For Goal Achievement: 01/24/19 Potential to Achieve Goals: Fair Progress towards PT goals: Progressing toward goals    Frequency    BID      PT Plan      Co-evaluation              AM-PAC PT "6 Clicks" Mobility   Outcome Measure  Help needed turning from your back to your side while in a flat bed without using bedrails?: A Little Help needed moving from lying on your back to sitting on the side of a flat bed without using bedrails?: A Little Help needed moving to and from a bed to a chair (including a wheelchair)?: A Little Help needed standing up from a chair using your arms (e.g., wheelchair or bedside chair)?: A Little Help needed to walk in hospital room?: A Little Help needed climbing 3-5 steps with a railing? : Total 6 Click Score: 16    End of Session Equipment Utilized During Treatment: Gait belt Activity Tolerance: Patient limited by pain Patient left: in bed;with call bell/phone within reach;with bed alarm set Nurse Communication: Mobility status;Patient requests pain meds PT Visit Diagnosis: Unsteadiness on feet (R26.81);Other abnormalities of gait and mobility (R26.89);Muscle weakness (generalized) (M62.81);History of falling (Z91.81);Pain;Difficulty in walking, not elsewhere classified (R26.2) Pain - Right/Left: Right Pain  - part of body: Hip     Time: 3335-4562 PT Time Calculation (min) (ACUTE ONLY): 38 min  Charges:  $Gait Training: 8-22 mins $Therapeutic Exercise: 23-37 mins                     Janna Arch, PT, DPT     Janna Arch 01/11/2019, 10:38 AM

## 2019-02-14 ENCOUNTER — Telehealth: Payer: Self-pay

## 2019-02-14 NOTE — Telephone Encounter (Signed)
Copied from [place] [id number]. Topic: General - Other >> Feb 13, 2019  4:50 PM Ivar Drape wrote: Reason for CRM:   Patient stated the appt she had on 02/15/19 at 9:30am was not cancelled by her.  She was under the impression she had an appt.  She would like to know if she should come into the office or if this is a virtual appt.  Please advise.  She also stated that if you cannot fit her in again on Friday that's okay.  The appt can be set up at another time.  Called and scheduled an virtual visit with the pt and provider and explained how virtual visits are handled. Pt understood and excepted her appt date and time.  Nina,cma

## 2019-02-15 ENCOUNTER — Encounter: Payer: Self-pay | Admitting: Family

## 2019-02-15 ENCOUNTER — Other Ambulatory Visit: Payer: Self-pay

## 2019-02-15 ENCOUNTER — Ambulatory Visit
Admission: RE | Admit: 2019-02-15 | Discharge: 2019-02-15 | Disposition: A | Discharge status: Home / Self Care | Payer: Medicare Other | Source: Ambulatory Visit | Attending: Family | Admitting: Family

## 2019-02-15 ENCOUNTER — Telehealth: Payer: Self-pay

## 2019-02-15 ENCOUNTER — Telehealth: Payer: Self-pay | Admitting: Family

## 2019-02-15 ENCOUNTER — Ambulatory Visit (INDEPENDENT_AMBULATORY_CARE_PROVIDER_SITE_OTHER): Payer: Medicare Other | Admitting: Family

## 2019-02-15 ENCOUNTER — Ambulatory Visit: Payer: Medicare Other | Admitting: Family

## 2019-02-15 DIAGNOSIS — M5136 Other intervertebral disc degeneration, lumbar region: Secondary | ICD-10-CM

## 2019-02-15 DIAGNOSIS — M810 Age-related osteoporosis without current pathological fracture: Secondary | ICD-10-CM

## 2019-02-15 DIAGNOSIS — F325 Major depressive disorder, single episode, in full remission: Secondary | ICD-10-CM

## 2019-02-15 DIAGNOSIS — I6523 Occlusion and stenosis of bilateral carotid arteries: Secondary | ICD-10-CM

## 2019-02-15 DIAGNOSIS — R600 Localized edema: Secondary | ICD-10-CM | POA: Diagnosis not present

## 2019-02-15 MED ORDER — POTASSIUM CHLORIDE ER 10 MEQ PO TBCR: EXTENDED_RELEASE_TABLET | ORAL | 0 refills | Status: DC

## 2019-02-15 MED ORDER — GABAPENTIN 300 MG PO CAPS: 300.0000 mg | ORAL_CAPSULE | Freq: Every day | ORAL | 3 refills | Status: DC

## 2019-02-15 NOTE — Telephone Encounter (Signed)
I called patient again on her cell leaving detailed VM in regards to having labs checked today.

## 2019-02-15 NOTE — Progress Notes (Signed)
This visit type was conducted due to national recommendations for restrictions regarding the COVID-19 pandemic (e.g. social distancing).  This format is felt to be most appropriate for this patient at this time.  All issues noted in this document were discussed and addressed.  No physical exam was performed (except for noted visual exam findings with Video Visits). Virtual Visit via Video Note  I connected with@  on 02/15/19 at 11:30 AM EDT by a video enabled telemedicine application and verified that I am speaking with the correct person using two identifiers.  Location patient: home Location provider:work  Persons participating in the virtual visit: patient, provider  I discussed the limitations of evaluation and management by telemedicine and the availability of in person appointments. The patient expressed understanding and agreed to proceed.   HPI:   Over doing well.   Bilateral 'top of feet' are still swollen x 3 months , overall some improvement. Not worse at end of day.  Some 'darker color' on top of feet ; not sure if left over from 'sun tan'. Taking hctz 12.5mg  with no relief. Adjustable bed, toes above nose. Cannot put on compression stockings due to hip right now.   5th week of recovery, walking over house and in yard. Using walker 'for balance.'  Working in yard. Not walking 37miles /day as had in the past.   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 On warfarin at that time.   Using norco QOD.   H/o left knee replacement 2013  US venous BL 10/2018 no dvt.   No SOB, orthopnea, breaks in skin, increased warmth,  Fever.   No varicosities, calf pain or difference in calf size.   Echo 10/2018. EF 60-65%.   BP at home 135/80, 116/80.   Right hip replacement 01/09/19 Dr Harlow Mares. Has  Been ding doing OT daily.   Depression- luvox ; doing well.   Osteoporosis- endocrinology.  Seen March of this year.  Advised to consider Prolia or Forteo.  Stress test 12/2018 negative Lexiscan  stress.  No evidence of ischemia.  Low risk study Seen By Dr Minette Brine cardiology 11/2018. Reviewed note.  Carotid stenosis US carotid   ROS: See pertinent positives and negatives per HPI.  Past Medical History:  Diagnosis Date  . Ankle fracture, bimalleolar, closed, right, initial encounter 10/26/2018   d/t fall 10/26/18  . Arthritis   . Back abrasion    CRUSHED VERTEBRAE  . Closed right radial fracture 10/26/2018   s/p a fall  . Compression of lumbar vertebra ([place])    L1 and L4 (from fall November 05, 2018)  . Depression   . Gallstones   . GERD (gastroesophageal reflux disease)   . Migraines   . Pulmonary emboli (H[facility])    history of emboli    Past Surgical History:  Procedure Laterality Date  . ABDOMINAL HYSTERECTOMY    . CATARACT EXTRACTION W/PHACO Right 03/27/2018   Procedure: CATARACT EXTRACTION PHACO AND INTRAOCULAR LENS PLACEMENT ([facility]);  Surgeon: Birder Robson, MD;  Location: [facility] ORS;  Service: Ophthalmology;  Laterality: Right;  [place] 00:33 AP% 15.3 CDE 5.18 Fluid pack lot # [id number]  . CATARACT EXTRACTION W/PHACO Left 06/26/2018   Procedure: CATARACT EXTRACTION PHACO AND INTRAOCULAR LENS PLACEMENT ([place]);  Surgeon: Birder Robson, MD;  Location: [facility] ORS;  Service: Ophthalmology;  Laterality: Left;  [place] 00:38 AP% 15.2 CDE 5.80 Fluid pack lot # [id number]  . FRACTURE SURGERY     RIGHT RADIAL  . JOINT REPLACEMENT     LEFT  TKR  . 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 TRIPLE SUBTALAR FUSION      Family History  Problem Relation Age of Onset  . Arthritis Mother   . Heart disease Mother   . Stroke Mother   . Arthritis Father   . Heart disease Father   . Stroke Father   . Breast cancer Neg Hx   . Colon cancer Neg Hx     SOCIAL HX: former smoker  Current Outpatient Medications:  .  acetaminophen (TYLENOL)  500 MG tablet, Take 500 mg by mouth 2 (two) times daily as needed (PAIN.)., Disp: , Rfl:  .  aspirin 81 MG chewable tablet, Chew 1 tablet (81 mg total) by mouth 2 (two) times daily., Disp: 60 tablet, Rfl: 0 .  atorvastatin (LIPITOR) 10 MG tablet, Take 1 tablet (10 mg total) by mouth daily. (Patient taking differently: Take 10 mg by mouth every evening. ), Disp: 90 tablet, Rfl: 1 .  Biotin 2500 MCG CAPS, Take 2,500 mcg by mouth daily., Disp: , Rfl:  .  Calcium Carb-Cholecalciferol (CALCIUM 600 + D PO), Take 2 tablets by mouth daily. , Disp: , Rfl:  .  docusate sodium (COLACE) 100 MG capsule, Take 1 capsule (100 mg total) by mouth 2 (two) times daily., Disp: 10 capsule, Rfl: 0 .  Fluticasone Furoate (FLONASE SENSIMIST NA), Place 1 spray into both nostrils daily as needed (allergies)., Disp: , Rfl:  .  fluvoxaMINE (LUVOX) 100 MG tablet, Take 0.5-1 tablets (50-100 mg total) by mouth daily. (Patient taking differently: Take 100 mg by mouth daily. ), Disp: 90 tablet, Rfl: 1 .  gabapentin (NEURONTIN) 300 MG capsule, Take 1 capsule (300 mg total) by mouth at bedtime., Disp: 90 capsule, Rfl: 3 .  hydrochlorothiazide (MICROZIDE) 12.5 MG capsule, Take 1 capsule (12.5 mg total) by mouth daily. (Patient taking differently: Take 12.5 mg by mouth every Monday, Wednesday, and Friday. ), Disp: 90 capsule, Rfl: 0 .  HYDROcodone-acetaminophen (NORCO) 7.5-325 MG tablet, Take 1-2 tablets by mouth every 4 (four) hours as needed for severe pain (pain score 7-10)., Disp: 30 tablet, Rfl: 0 .  pantoprazole (PROTONIX) 40 MG tablet, Take 1 tablet (40 mg total) by mouth 2 (two) times daily., Disp: 180 tablet, Rfl: 3 .  traZODone (DESYREL) 50 MG tablet, Take 50-100 mg by mouth at bedtime as needed for sleep. , Disp: , Rfl:   ASSESSMENT AND PLAN:  Discussed the following assessment and plan:  Leg edema - Plan: US Venous Img Lower Bilateral, Basic metabolic panel, Basic metabolic panel  Other intervertebral disc degeneration,  lumbar region - Plan: gabapentin (NEURONTIN) 300 MG capsule  Bilateral carotid artery stenosis - Plan: US Carotid Duplex Bilateral  Osteoporosis, unspecified osteoporosis type, unspecified pathological fracture presence  Depression, major, single episode, complete remission ([place])     Problem List Items Addressed This Visit      Cardiovascular and Mediastinum   Carotid artery stenosis    Pending [place].       Relevant Orders   US Carotid Duplex Bilateral     Musculoskeletal and Integument   Osteoporosis    Following with endocrine.  Will follow        Other   Depression, major, single episode, complete remission ([place])    Doing well on regimen will continue      Leg edema - Primary  Discussed limitations at great length in regards to telemedicine.  Unfortunately we were unable to use video, over the phone particularly hard to assess lower extremity edema.  Patient did not appreciate increased warmth, redness indicate to infection.  Her skin is intact.  The swelling is bilateral and on top of her feet versus in her calves.  Based on her description, I presume hyperpigmentation bilaterally on dorsal aspect of feet may be a sign of venous insufficiency.  She politely declines  referral to vascular at this time however we will consider this in the future  however she had a recent surgery, history of DVT, and decreased activity from baseline.  We jointly agreed to be cautious with pursue stat ultrasound of legs to ensure no DVT. Hypokalemia noted 2 months ago without recheck; would like patient to have labs done today as well.  As long as negative, advised her to proceed with using hydrochlorothiazide daily until Monday with repeat labs.  Patient verbalized understanding.  She will remain vigilant and understands to seek medical attention over the weekend if symptoms were to worsen or new symptoms present      Relevant Orders   US Venous Img Lower Bilateral   Basic metabolic panel    Basic metabolic panel    Other Visit Diagnoses    Other intervertebral disc degeneration, lumbar region       Relevant Medications   gabapentin (NEURONTIN) 300 MG capsule      I discussed the assessment and treatment plan with the patient. The patient was provided an opportunity to ask questions and all were answered. The patient agreed with the plan and demonstrated an understanding of the instructions.   The patient was advised to call back or seek an in-person evaluation if the symptoms worsen or if the condition fails to improve as anticipated.   Mable Paris, FNP   I spent 25 min non face to face w/ pt.

## 2019-02-15 NOTE — Telephone Encounter (Signed)
Noted [facility] sent Labs on Monday

## 2019-02-15 NOTE — Assessment & Plan Note (Signed)
Following with endocrine.  Will follow

## 2019-02-15 NOTE — Telephone Encounter (Signed)
I have tried to call patient back on home phone as well as mobile. I was unable to reach. I then tried daughter's number to ask if she had another way to reach her mother, she also did not answer.

## 2019-02-15 NOTE — Telephone Encounter (Signed)
I called and left a very detailed message asking that patient have labs done at hospital while she is there for [place]. I will continue to try to contact patient.

## 2019-02-15 NOTE — Telephone Encounter (Signed)
Radiology Report for Ultrasound of both legs dopler venous. Results are negative for DVT read by Dr, Earleen Newport, Radiologist

## 2019-02-15 NOTE — Assessment & Plan Note (Signed)
Doing well on regimen will continue

## 2019-02-15 NOTE — Assessment & Plan Note (Addendum)
Discussed limitations at great length in regards to telemedicine.  Unfortunately we were unable to use video, over the phone particularly hard to assess lower extremity edema.  Patient did not appreciate increased warmth, redness indicate to infection.  Her skin is intact.  The swelling is bilateral and on top of her feet versus in her calves.  Based on her description, I presume hyperpigmentation bilaterally on dorsal aspect of feet may be a sign of venous insufficiency.  She politely declines  referral to vascular at this time however we will consider this in the future  however she had a recent surgery, history of DVT, and decreased activity from baseline.  We jointly agreed to be cautious with pursue stat ultrasound of legs to ensure no DVT. Hypokalemia noted 2 months ago without recheck; would like patient to have labs done today as well.  As long as negative, advised her to proceed with using hydrochlorothiazide daily until Monday with repeat labs.  Patient verbalized understanding.  She will remain vigilant and understands to seek medical attention over the weekend if symptoms were to worsen or new symptoms present

## 2019-02-15 NOTE — Assessment & Plan Note (Signed)
Pending US

## 2019-02-15 NOTE — Telephone Encounter (Signed)
I have called and left messages both on cell phone, home phone in regards to patient having labs done at [place] due to low potassium seen in March. ( this was around surgery time)  Please keep calling patient to ensure she is gotten this message. I would like to have these asap

## 2019-02-15 NOTE — Telephone Encounter (Signed)
Noted Patient aware ; sarah spoke with her on phone

## 2019-02-15 NOTE — Patient Instructions (Addendum)
Labs Monday Feb 18, 2019 afternoon 2:15pm  Start HCTZ 12.5mg  DAILY. Elevate legs.   I will peak at your legs then as well.   We will call you in regards to scheduling [place] legs TODAY, STAT.   Today we discussed referrals, orders. US carotid   I have placed these orders in the system for you.  Please be sure to give [place] a call if you have not heard from our office regarding this. We should hear from [place] within ONE week with information regarding your appointment. If not, please let me know immediately.     Peripheral Edema  Peripheral edema is swelling that is caused by a buildup of fluid. Peripheral edema most often affects the lower legs, ankles, and feet. It can also develop in the arms, hands, and face. The area of the body that has peripheral edema will look swollen. It may also feel heavy or warm. Your clothes may start to feel tight. Pressing on the area may make a temporary dent in your skin. You may not be able to move your arm or leg as much as usual. There are many causes of peripheral edema. It can be a complication of other diseases, such as congestive heart failure, kidney disease, or a problem with your blood circulation. It also can be a side effect of certain medicines. It often happens to women during pregnancy. Sometimes, the cause is not known. Treating the underlying condition is often the only treatment for peripheral edema. Follow these instructions at home: Pay attention to any changes in your symptoms. Take these actions to help with your discomfort:  Raise (elevate) your legs while you are sitting or lying down.  Move around often to prevent stiffness and to lessen swelling. Do not sit or stand for long periods of time.  Wear support stockings as told by your health care provider.  Follow instructions from your health care provider about limiting salt (sodium) in your diet. Sometimes eating less salt can reduce swelling.  Take over-the-counter and prescription  medicines only as told by your health care provider. Your health care provider may prescribe medicine to help your body get rid of excess water (diuretic).  Keep all follow-up visits as told by your health care provider. This is important. Contact a health care provider if:  You have a fever.  Your edema starts suddenly or is getting worse, especially if you are pregnant or have a medical condition.  You have swelling in only one leg.  You have increased swelling and pain in your legs. Get help right away if:  You develop shortness of breath, especially when you are lying down.  You have pain in your chest or abdomen.  You feel weak.  You faint. This information is not intended to replace advice given to you by your health care provider. Make sure you discuss any questions you have with your health care provider. Document Released: 11/10/2004 Document Revised: 03/07/2016 Document Reviewed: 04/15/2015 Elsevier Interactive Patient Education  2019 [employer].

## 2019-02-18 ENCOUNTER — Telehealth: Payer: Self-pay | Admitting: Family

## 2019-02-18 ENCOUNTER — Other Ambulatory Visit (INDEPENDENT_AMBULATORY_CARE_PROVIDER_SITE_OTHER): Payer: Medicare Other

## 2019-02-18 ENCOUNTER — Ambulatory Visit: Payer: Self-pay

## 2019-02-18 ENCOUNTER — Other Ambulatory Visit: Payer: Self-pay

## 2019-02-18 DIAGNOSIS — R600 Localized edema: Secondary | ICD-10-CM | POA: Diagnosis not present

## 2019-02-18 DIAGNOSIS — R748 Abnormal levels of other serum enzymes: Secondary | ICD-10-CM

## 2019-02-18 NOTE — Telephone Encounter (Signed)
Pt called stating that she has very bad pitting edema to her ankles both sides Left is worse.  She states that this has been going on for about a week.  She states that skin is shinny red rashy and sore. The sometimes itch. Pitting edema. She states she has had recent hip surgery.  She has addressed this with Rande Brunt and had an [place] which was negative.  Pt denies SOB chest pain. Per protocol pt will go to urgent care today for evalution of her symptoms.   Reason for Disposition . [1] Thigh, calf, or ankle swelling AND [2] bilateral AND [3] 1 side is more swollen  Answer Assessment - Initial Assessment Questions 1. LOCATION: "Which joint is swollen?"    Both ankles 2. ONSET: "When did the swelling start?"     1 week ago 3. SIZE: "How large is the swelling?"     Skin is shinny 4. PAIN: "Is there any pain?" If so, ask: "How bad is it?" (Scale 1-10; or mild, moderate, severe)    Sore not pain  5. CAUSE: "What do you think caused the swollen joint?"     no 6. OTHER SYMPTOMS: "Do you have any other symptoms?" (e.g., fever, chest pain, difficulty breathing, calf pain)     no 7. PREGNANCY: "Is there any chance you are pregnant?" "When was your last menstrual period?"   N/A  Answer Assessment - Initial Assessment Questions 1. ONSET: "When did the swelling start?" (e.g., minutes, hours, days)     Last week 2. LOCATION: "What part of the leg is swollen?"  "Are both legs swollen or just one leg?"     both 3. SEVERITY: "How bad is the swelling?" (e.g., localized; mild, moderate, severe)  - Localized - small area of swelling localized to one leg  - MILD pedal edema - swelling limited to foot and ankle, pitting edema < 1/4 inch (6 mm) deep, rest and elevation eliminate most or all swelling  - MODERATE edema - swelling of lower leg to knee, pitting edema > 1/4 inch (6 mm) deep, rest and elevation only partially reduce swelling  - SEVERE edema - swelling extends above knee, facial or hand swelling  present      Sore both above the ankle joint 4. REDNESS: "Does the swelling look red or infected?"     yes 5. PAIN: "Is the swelling painful to touch?" If so, ask: "How painful is it?"   (Scale 1-10; mild, moderate or severe)     Mild just sore itching 6. FEVER: "Do you have a fever?" If so, ask: "What is it, how was it measured, and when did it start?"     No 7. CAUSE: "What do you think is causing the leg swelling?"    no 8. MEDICAL HISTORY: "Do you have a history of heart failure, kidney disease, liver failure, or cancer?"    no 9. RECURRENT SYMPTOM: "Have you had leg swelling before?" If so, ask: "When was the last time?" "What happened that time?"    Only in feet 10. OTHER SYMPTOMS: "Do you have any other symptoms?" (e.g., chest pain, difficulty breathing)       No 11. PREGNANCY: "Is there any chance you are pregnant?" "When was your last menstrual period?"      N/A  Protocols used: LEG SWELLING AND EDEMA-A-AH, ANKLE SWELLING-A-AH

## 2019-02-18 NOTE — Telephone Encounter (Signed)
Call pt  I meant to see her briefly when she came in for labs today 02/18/19.   How is she doing ? How is her swelling in her legs?

## 2019-02-19 LAB — BASIC METABOLIC PANEL
BUN: 10 mg/dL (ref 6–23)
CO2: 29 mEq/L (ref 19–32)
Calcium: 9.6 mg/dL (ref 8.4–10.5)
Chloride: 102 mEq/L (ref 96–112)
Creatinine, Ser: 0.81 mg/dL (ref 0.40–1.20)
GFR: 68.61 mL/min (ref >60.00)
Glucose, Bld: 109 mg/dL — ABNORMAL HIGH (ref 70–99)
Potassium: 3.9 mEq/L (ref 3.5–5.1)
Sodium: 138 mEq/L (ref 135–145)

## 2019-02-19 NOTE — Telephone Encounter (Signed)
See triage note.

## 2019-02-19 NOTE — Telephone Encounter (Signed)
Patient is seeing Dr. Harlow Mares this morning at 10:30, this is the Orthopaedic surgeon for her hip replacement.

## 2019-02-20 ENCOUNTER — Ambulatory Visit (INDEPENDENT_AMBULATORY_CARE_PROVIDER_SITE_OTHER): Payer: Medicare Other | Admitting: Family

## 2019-02-20 ENCOUNTER — Encounter: Payer: Self-pay | Admitting: Family

## 2019-02-20 ENCOUNTER — Ambulatory Visit: Payer: Medicare Other

## 2019-02-20 DIAGNOSIS — R600 Localized edema: Secondary | ICD-10-CM | POA: Diagnosis not present

## 2019-02-20 MED ORDER — FUROSEMIDE 20 MG PO TABS: 20.0000 mg | ORAL_TABLET | Freq: Every day | ORAL | 3 refills | Status: DC

## 2019-02-20 MED ORDER — DOXYCYCLINE HYCLATE 100 MG PO TBEC: 100.0000 mg | DELAYED_RELEASE_TABLET | Freq: Two times a day (BID) | ORAL | 0 refills | Status: DC

## 2019-02-20 NOTE — Assessment & Plan Note (Addendum)
Patient is well-appearing, she is nontoxic in appearance and afebrile.  I am reassured by some improvement of lower extremity swelling however it is not resolved.  I do have concern about secondary bacterial infection, cellulitis.  We jointly agreed to pursue second ultrasound to ensure no blood clot as patient has a history of DVT, PE, and she does endorse asymmetry of her calves.  We will change from HCTZ to Lasix, patient will start potassium chloride while on Lasix.  Will also cover for secondary cellulitis with doxycycline.  Close follow-up one week and, the patient will stay vigilant at home.  Referral to vascular due to chronic lower extremity edema

## 2019-02-20 NOTE — Telephone Encounter (Signed)
Patient has appointment today at 10:30.

## 2019-02-20 NOTE — Telephone Encounter (Signed)
Pt states that that [facility] called and said that they are out of one of the medications that was sent in today for her and they did not state which medication. She said if it is one of the medications that she needs to start taking today please send it to [facility] on [address].

## 2019-02-20 NOTE — Patient Instructions (Signed)
STOP HCTZ Start lasix , take with potassium chloride as we discussed.   Start doxycycline ( antibiotic). Be careful with being out in the sun as this medication can make it easier for sunburn. Ensure to take probiotics while on antibiotics and also for 2 weeks after completion. It is important to re-colonize the gut with good bacteria and also to prevent any diarrheal infections associated with antibiotic use.   Continue to elevate legs, low salt.   Today we discussed referrals, orders. STAT ultrasound of legs TODAY, referral to vascular.     I have placed these orders in the system for you.  Please be sure to give [place] a call if you have not heard from our office regarding this. We should hear from [place] within ONE week with information regarding your appointment. If not, please let me know immediately.     We will see you next week, certainly before if ANY concerns

## 2019-02-20 NOTE — Progress Notes (Signed)
This visit type was conducted due to national recommendations for restrictions regarding the COVID-19 pandemic (e.g. social distancing).  This format is felt to be most appropriate for this patient at this time.  All issues noted in this document were discussed and addressed.  No physical exam was performed (except for noted visual exam findings with Video Visits). Virtual Visit via Video Note  I connected with@  on 02/20/19 at 10:30 AM EDT by a video enabled telemedicine application and verified that I am speaking with the correct person using two identifiers.  Location patient: home Location provider:work or home office Persons participating in the virtual visit: patient, provider  I discussed the limitations of evaluation and management by telemedicine and the availability of in person appointments. The patient expressed understanding and agreed to proceed.   HPI: CC: BLE swelling and swelling, improved. Thinks swelling improving from HCTZ. Hasnt started KCl.   Left calf is 'slightly larger' than right. No cuts in the skin.  Had been using topical steriod cream ( without relief) and then started Triple P since yesterday.   Redness is symmetrical on both legs.  No weeping, sob, orthoapnea, chest pain, fever, N, V.   Sleeping with legs elevated with relief.   Saw Dr Harlow Mares yesterday- note is not available in [place].  H/o PE, dvt.    ROS: See pertinent positives and negatives per HPI.  Past Medical History:  Diagnosis Date  . Ankle fracture, bimalleolar, closed, right, initial encounter 10/26/2018   d/t fall 10/26/18  . Arthritis   . Back abrasion    CRUSHED VERTEBRAE  . Closed right radial fracture 10/26/2018   s/p a fall  . Compression of lumbar vertebra ([place])    L1 and L4 (from fall November 05, 2018)  . Depression   . Gallstones   . GERD (gastroesophageal reflux disease)   . Migraines   . Pulmonary emboli (HCC)    history of emboli    Past Surgical History:   Procedure Laterality Date  . ABDOMINAL HYSTERECTOMY    . CATARACT EXTRACTION W/PHACO Right 03/27/2018   Procedure: CATARACT EXTRACTION PHACO AND INTRAOCULAR LENS PLACEMENT (IOC);  Surgeon: Birder Robson, MD;  Location: [facility]S;  Service: Ophthalmology;  Laterality: Right;  [place] 00:33 AP% 15.3 CDE 5.18 Fluid pack lot # [id number]  . CATARACT EXTRACTION W/PHACO Left 06/26/2018   Procedure: CATARACT EXTRACTION PHACO AND INTRAOCULAR LENS PLACEMENT ([place]);  Surgeon: Birder Robson, MD;  Location: [facility] OR[facility];  Service: Ophthalmology;  Laterality: Left;  [place] 00:38 AP% 15.2 CDE 5.80 Fluid pack lot # [id number]  . FRACTURE SURGERY     RIGHT RADIAL  . JOINT REPLACEMENT     LEFT TKR  . 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 TRIPLE SUBTALAR FUSION      Family History  Problem Relation Age of Onset  . Arthritis Mother   . Heart disease Mother   . Stroke Mother   . Arthritis Father   . Heart disease Father   . Stroke Father   . Breast cancer Neg Hx   . Colon cancer Neg Hx     SOCIAL HX: former smoker   Current Outpatient Medications:  .  acetaminophen (TYLENOL) 500 MG tablet, Take 500 mg by mouth 2 (two) times daily as needed (PAIN.)., Disp: , Rfl:  .  aspirin 81 MG chewable tablet, Chew 1  tablet (81 mg total) by mouth 2 (two) times daily., Disp: 60 tablet, Rfl: 0 .  atorvastatin (LIPITOR) 10 MG tablet, Take 1 tablet (10 mg total) by mouth daily. (Patient taking differently: Take 10 mg by mouth every evening. ), Disp: 90 tablet, Rfl: 1 .  Biotin 2500 MCG CAPS, Take 2,500 mcg by mouth daily., Disp: , Rfl:  .  Calcium Carb-Cholecalciferol (CALCIUM 600 + D PO), Take 2 tablets by mouth daily. , Disp: , Rfl:  .  Fluticasone Furoate (FLONASE SENSIMIST NA), Place 1 spray into both nostrils daily as needed (allergies)., Disp: , Rfl:  .   fluvoxaMINE (LUVOX) 100 MG tablet, Take 0.5-1 tablets (50-100 mg total) by mouth daily. (Patient taking differently: Take 100 mg by mouth daily. ), Disp: 90 tablet, Rfl: 1 .  gabapentin (NEURONTIN) 300 MG capsule, Take 1 capsule (300 mg total) by mouth at bedtime., Disp: 90 capsule, Rfl: 3 .  HYDROcodone-acetaminophen (NORCO) 7.5-325 MG tablet, Take 1-2 tablets by mouth every 4 (four) hours as needed for severe pain (pain score 7-10)., Disp: 30 tablet, Rfl: 0 .  pantoprazole (PROTONIX) 40 MG tablet, Take 1 tablet (40 mg total) by mouth 2 (two) times daily., Disp: 180 tablet, Rfl: 3 .  traZODone (DESYREL) 50 MG tablet, Take 50-100 mg by mouth at bedtime as needed for sleep. , Disp: , Rfl:  .  doxycycline (DORYX) 100 MG EC tablet, Take 1 tablet (100 mg total) by mouth 2 (two) times daily., Disp: 20 tablet, Rfl: 0 .  furosemide (LASIX) 20 MG tablet, Take 1 tablet (20 mg total) by mouth daily., Disp: 30 tablet, Rfl: 3 .  potassium chloride (K-DUR) 10 MEQ tablet, Take one tablet PO daily while on HCTZ ( diuretic). (Patient not taking: Reported on 02/20/2019), Disp: 30 tablet, Rfl: 0  EXAM:  VITALS per patient if applicable:  GENERAL: alert, oriented, appears well and in no acute distress  HEENT: atraumatic, conjunttiva clear, no obvious abnormalities on inspection of external nose and ears  NECK: normal movements of the head and neck  LUNGS: on inspection no signs of respiratory distress, breathing rate appears normal, no obvious gross SOB, gasping or wheezing  CV: no obvious cyanosis  MS: moves all visible extremities without noticeable abnormality  LE: Over Video, was able to appreciate edematous bilateral extremities.  Unable to tell in regards to symmetry.  Diffuse hyperpigmentation noted bilaterally.  Skin intact.  PSYCH/NEURO: pleasant and cooperative, no obvious depression or anxiety, speech and thought processing grossly intact  ASSESSMENT AND PLAN:  Discussed the following assessment  and plan:  Leg edema - Plan: furosemide (LASIX) 20 MG tablet, Ambulatory referral to Vascular Surgery, US Venous Img Lower Bilateral, doxycycline (DORYX) 100 MG EC tablet     Problem List Items Addressed This Visit      Other   Leg edema - Primary    Patient is well-appearing, she is nontoxic in appearance and afebrile.  I am reassured by some improvement of lower extremity swelling however it is not resolved.  I do have concern about secondary bacterial infection, cellulitis.  We jointly agreed to pursue second ultrasound to ensure no blood clot as patient has a history of DVT, PE, and she does endorse asymmetry of her calves.  We will change from HCTZ to Lasix, patient will start potassium chloride while on Lasix.  Will also cover for secondary cellulitis with doxycycline.  Close follow-up one week and, the patient will stay vigilant at home.  Referral to vascular due  to chronic lower extremity edema      Relevant Medications   furosemide (LASIX) 20 MG tablet   doxycycline (DORYX) 100 MG EC tablet   Other Relevant Orders   Ambulatory referral to Vascular Surgery   US Venous Img Lower Bilateral      I discussed the assessment and treatment plan with the patient. The patient was provided an opportunity to ask questions and all were answered. The patient agreed with the plan and demonstrated an understanding of the instructions.   The patient was advised to call back or seek an in-person evaluation if the symptoms worsen or if the condition fails to improve as anticipated.   Mable Paris, FNP

## 2019-02-21 ENCOUNTER — Encounter: Payer: Self-pay | Admitting: Family

## 2019-02-22 ENCOUNTER — Telehealth: Payer: Self-pay | Admitting: Family

## 2019-02-22 ENCOUNTER — Encounter: Payer: Self-pay | Admitting: Family

## 2019-02-22 NOTE — Telephone Encounter (Signed)
Spoke with  patient at length regards her symptoms.  Unfortunately ultrasound was not done due to miscommunication yesterday at [place] location.  I expressed my apologies for this.   Patient was not sure if it was really necessary to have ultrasound done .  She does not feel like left leg  swelling is change since prior Doppler study, which is negative for DVT on May 1.  She feels  difference  in calf size is that she is been favoring right side since right hip surgery. We discussed potential for atrophy.   she states again no shortness of breath, calf pain, increased warmth or heat, fever. She feels well  She will send me calf measurements.

## 2019-02-27 ENCOUNTER — Encounter: Payer: Self-pay | Admitting: Family

## 2019-02-27 ENCOUNTER — Other Ambulatory Visit: Payer: Self-pay

## 2019-02-27 ENCOUNTER — Ambulatory Visit (INDEPENDENT_AMBULATORY_CARE_PROVIDER_SITE_OTHER): Payer: Medicare Other | Admitting: Family

## 2019-02-27 VITALS — BP 110/60 | HR 73 | Temp 97.8°F | Ht 62.0 in | Wt 176.8 lb

## 2019-02-27 DIAGNOSIS — R600 Localized edema: Secondary | ICD-10-CM | POA: Diagnosis not present

## 2019-02-27 DIAGNOSIS — M5137 Other intervertebral disc degeneration, lumbosacral region: Secondary | ICD-10-CM

## 2019-02-27 MED ORDER — MUPIROCIN 2 % EX OINT: 1.0000 "application " | TOPICAL_OINTMENT | Freq: Two times a day (BID) | CUTANEOUS | 0 refills | Status: DC

## 2019-02-27 MED ORDER — MELOXICAM 7.5 MG PO TABS: 7.5000 mg | ORAL_TABLET | Freq: Every day | ORAL | 0 refills | Status: DC | PRN

## 2019-02-27 NOTE — Patient Instructions (Signed)
As discussed, you may continue taking Lasix daily.  Please take 20 mg dose of Lasix with no potassium chloride.  After you have been seen by vascular, if you would like to take 40 mg of Lasix daily for a couple of days, that is reasonable.  I will certainly call you in regards to your lab results.  Please continue the doxycycline, call me for sure if you feel that the dose needs to be extended. Please also start a topical antibiotic, Bactroban.  Regards to back pain as we discussed today, may use meloxicam as needed. This medication is similar to ibuprofen, Motrin, Goody's powder etc.   You do not need to take another anti-inflammatory while on meloxicam.  Please ensure you take with food.  Enjoyed seeing you today, take care and let me know if you need anything at all!

## 2019-02-27 NOTE — Progress Notes (Signed)
Subjective:    Patient ID: Claudia Andersen, female    DOB: 1941-04-16, 78 y.o.   MRN: [id number]  CC: JANITZA Andersen is a 78 y.o. female who presents today for an acute visit.    HPI: HPI  CC: leg swelling 3 months,  overall improved with elevation, Lasix.  .  Discoloration of BLE has improved as well since starting doxcycline.    No purulent discharge, weeping of legs.  Bilaterally tender to touch.  No increased heat.  No orthopnea, shortness of breath, cough, chest pain.  Using triple P ointment.   Calf circumferecne has been unchanged since last doppler study.  She is not noticed greater than 3 cm difference in either leg.  Seeing vascular tomorrow  Also complains of mid to right sided low back pain, this is chronic.  Known degenerative disease, MRI done January of this year, reviewed today. She has seen Dr Sharyne Richters for this without plans for surgery of low back. Relief with [place], gabapentin and occasional norco with relief. Worse of late with using rollator since hip surgery and she suspects from posture. Pain improves with improved posture.   No saddle anesthesia, numbness, difficulty with bowel or urine. No h/o GIB.  No history of cancer. No CKD.   HISTORY:  Past Medical History:  Diagnosis Date  . Ankle fracture, bimalleolar, closed, right, initial encounter 10/26/2018   d/t fall 10/26/18  . Arthritis   . Back abrasion    CRUSHED VERTEBRAE  . Closed right radial fracture 10/26/2018   s/p a fall  . Compression of lumbar vertebra ([place])    L1 and L4 (from fall November 05, 2018)  . Depression   . Gallstones   . GERD (gastroesophageal reflux disease)   . Migraines   . Pulmonary emboli (HCC)    history of emboli   Past Surgical History:  Procedure Laterality Date  . ABDOMINAL HYSTERECTOMY    . CATARACT EXTRACTION W/PHACO Right 03/27/2018   Procedure: CATARACT EXTRACTION PHACO AND INTRAOCULAR LENS PLACEMENT (IOC);  Surgeon: Birder Robson, MD;  Location: [facility];   Service: Ophthalmology;  Laterality: Right;  [place] 00:33 AP% 15.3 CDE 5.18 Fluid pack lot # [id number]  . CATARACT EXTRACTION W/PHACO Left 06/26/2018   Procedure: CATARACT EXTRACTION PHACO AND INTRAOCULAR LENS PLACEMENT ([place]);  Surgeon: Birder Robson, MD;  Location: [facility];  Service: Ophthalmology;  Laterality: Left;  [place] 00:38 AP% 15.2 CDE 5.80 Fluid pack lot # [id number]  . FRACTURE SURGERY     RIGHT RADIAL  . JOINT REPLACEMENT     LEFT TKR  . 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 TRIPLE SUBTALAR FUSION     Family History  Problem Relation Age of Onset  . Arthritis Mother   . Heart disease Mother   . Stroke Mother   . Arthritis Father   . Heart disease Father   . Stroke Father   . Breast cancer Neg Hx   . Colon cancer Neg Hx     Allergies: Patient has no known allergies. Current Outpatient Medications on File Prior to Visit  Medication Sig Dispense Refill  . acetaminophen (TYLENOL) 500 MG tablet Take 500 mg by mouth 2 (two) times daily as needed (PAIN.).    Marland Kitchen aspirin 81 MG chewable tablet Chew 1 tablet (81 mg total) by mouth 2 (two) times daily. [place]  tablet 0  . atorvastatin (LIPITOR) 10 MG tablet Take 1 tablet (10 mg total) by mouth daily. (Patient taking differently: Take 10 mg by mouth every evening. ) 90 tablet 1  . Biotin 2500 MCG CAPS Take 2,500 mcg by mouth daily.    . Calcium Carb-Cholecalciferol (CALCIUM 600 + D PO) Take 2 tablets by mouth daily.     Marland Kitchen doxycycline (DORYX) 100 MG EC tablet Take 1 tablet (100 mg total) by mouth 2 (two) times daily. 20 tablet 0  . Fluticasone Furoate (FLONASE SENSIMIST NA) Place 1 spray into both nostrils daily as needed (allergies).    . fluvoxaMINE (LUVOX) 100 MG tablet Take 0.5-1 tablets (50-100 mg total) by mouth daily. (Patient taking differently: Take 100 mg by mouth daily. ) 90  tablet 1  . gabapentin (NEURONTIN) 300 MG capsule Take 1 capsule (300 mg total) by mouth at bedtime. 90 capsule 3  . HYDROcodone-acetaminophen (NORCO) 7.5-325 MG tablet Take 1-2 tablets by mouth every 4 (four) hours as needed for severe pain (pain score 7-10). 30 tablet 0  . pantoprazole (PROTONIX) 40 MG tablet Take 1 tablet (40 mg total) by mouth 2 (two) times daily. 180 tablet 3  . traZODone (DESYREL) 50 MG tablet Take 50-100 mg by mouth at bedtime as needed for sleep.     . furosemide (LASIX) 20 MG tablet Take 1 tablet (20 mg total) by mouth daily. (Patient not taking: Reported on 02/27/2019) 30 tablet 3  . potassium chloride (K-DUR) 10 MEQ tablet Take one tablet PO daily while on HCTZ ( diuretic). (Patient not taking: Reported on 02/27/2019) 30 tablet 0   No current facility-administered medications on file prior to visit.     Social History   Tobacco Use  . Smoking status: Former Research scientist (life sciences)  . Smokeless tobacco: Never Used  Substance Use Topics  . Alcohol use: Yes    Comment: 5-7 glasses per week  . Drug use: Not on file    Review of Systems  Constitutional: Negative for chills and fever.  Respiratory: Negative for cough and shortness of breath.   Cardiovascular: Positive for leg swelling. Negative for chest pain and palpitations.  Gastrointestinal: Negative for nausea and vomiting.  Genitourinary: Negative for difficulty urinating.  Musculoskeletal: Positive for back pain.  Neurological: Negative for numbness.      Objective:    BP 110/60   Pulse 73   Temp 97.8 F (36.6 C) (Oral)   Ht 5\' 2"  (1.575 m)   Wt 176 lb 12.8 oz (80.2 kg)   SpO2 97%   BMI 32.34 kg/m  Wt Readings from Last 3 Encounters:  02/28/19 174 lb (78.9 kg)  02/27/19 176 lb 12.8 oz (80.2 kg)  01/09/19 165 lb (74.8 kg)     Physical Exam Vitals signs reviewed.  Constitutional:      Appearance: She is well-developed.  Eyes:     Conjunctiva/sclera: Conjunctivae normal.  Cardiovascular:     Rate and  Rhythm: Normal rate and regular rhythm.     Pulses: Normal pulses.     Heart sounds: Normal heart sounds.     Comments: BLE edema.  No palpable cords or masses. Erythematous appearance of bilateral distal calves.  No increased warmth. No asymmetry in calf size when compared bilaterally LE hair growth symmetric and present.  LE warm and palpable pedal pulses.  Pulmonary:     Effort: Pulmonary effort is normal.     Breath sounds: Normal breath sounds. No wheezing, rhonchi or rales.  Musculoskeletal:  Lumbar back: She exhibits tenderness and pain. She exhibits normal range of motion, no bony tenderness, no swelling, no edema and no spasm.       Arms:     Right lower leg: 1+ Pitting Edema present.     Left lower leg: 1+ Pitting Edema present.     Comments: Full range of motion with flexion, tension, lateral side bends. No bony tenderness. No pain, numbness, tingling elicited with single leg raise bilaterally.   Skin:    General: Skin is warm and dry.  Neurological:     Mental Status: She is alert.     Sensory: No sensory deficit.     Deep Tendon Reflexes:     Reflex Scores:      Patellar reflexes are 2+ on the right side and 2+ on the left side.    Comments: Sensation and strength intact bilateral lower extremities.  Psychiatric:        Speech: Speech normal.        Behavior: Behavior normal.        Thought Content: Thought content normal.        Assessment & Plan:   Problem List Items Addressed This Visit      Musculoskeletal and Integument   DDD (degenerative disc disease), lumbosacral    Acute on chronic. Worsened by rollator. Advised prn use of mobic. She will let me know how she is doing.       Relevant Medications   meloxicam (MOBIC) 7.5 MG tablet     Other   Leg edema - Primary    Pleased with overall improvement. Suspect venous insufficiency.  Advised that she may continue taking Lasix daily. Advised may take 40 mg of Lasix daily for a couple of days, if  needed. Pending vascular consult. She declines repeat of duplex. Will cover with bactroban for secondary bacterial infection and she will complete doxycycline.       Relevant Medications   mupirocin ointment (BACTROBAN) 2 %   Other Relevant Orders   Basic metabolic panel (Completed)   Magnesium (Completed)         I am having [name] "[place]" start on mupirocin ointment and meloxicam. I am also having her maintain her traZODone, Calcium Carb-Cholecalciferol (CALCIUM 600 + D PO), Fluticasone Furoate (FLONASE SENSIMIST NA), atorvastatin, pantoprazole, fluvoxaMINE, acetaminophen, Biotin, HYDROcodone-acetaminophen, aspirin, gabapentin, potassium chloride, furosemide, and doxycycline.   Meds ordered this encounter  Medications  . mupirocin ointment (BACTROBAN) 2 %    Sig: Apply 1 application topically 2 (two) times daily.    Dispense:  22 g    Refill:  0    Order Specific Question:   Supervising Provider    Answer:   Derrel Nip, TERESA L [[id number]]  . meloxicam (MOBIC) 7.5 MG tablet    Sig: Take 1 tablet (7.5 mg total) by mouth daily as needed for pain.    Dispense:  90 tablet    Refill:  0    Order Specific Question:   Supervising Provider    Answer:   Crecencio Mc [[id number]]    Return precautions given.   Risks, benefits, and alternatives of the medications and treatment plan prescribed today were discussed, and patient expressed understanding.   Education regarding symptom management and diagnosis given to patient on AVS.  Continue to follow with Burnard Hawthorne, FNP for routine health maintenance.   [name] and I agreed with plan.   Mable Paris, FNP

## 2019-02-28 ENCOUNTER — Other Ambulatory Visit (INDEPENDENT_AMBULATORY_CARE_PROVIDER_SITE_OTHER): Payer: Self-pay | Admitting: Vascular Surgery

## 2019-02-28 ENCOUNTER — Ambulatory Visit (INDEPENDENT_AMBULATORY_CARE_PROVIDER_SITE_OTHER): Payer: Medicare Other | Admitting: Vascular Surgery

## 2019-02-28 ENCOUNTER — Ambulatory Visit (INDEPENDENT_AMBULATORY_CARE_PROVIDER_SITE_OTHER): Payer: Medicare Other

## 2019-02-28 ENCOUNTER — Encounter (INDEPENDENT_AMBULATORY_CARE_PROVIDER_SITE_OTHER): Payer: Self-pay | Admitting: Vascular Surgery

## 2019-02-28 VITALS — BP 147/72 | HR 81 | Resp 16 | Ht 62.0 in | Wt 174.0 lb

## 2019-02-28 DIAGNOSIS — L819 Disorder of pigmentation, unspecified: Secondary | ICD-10-CM

## 2019-02-28 DIAGNOSIS — R600 Localized edema: Secondary | ICD-10-CM

## 2019-02-28 DIAGNOSIS — M5137 Other intervertebral disc degeneration, lumbosacral region: Secondary | ICD-10-CM

## 2019-02-28 DIAGNOSIS — I6523 Occlusion and stenosis of bilateral carotid arteries: Secondary | ICD-10-CM

## 2019-02-28 DIAGNOSIS — I872 Venous insufficiency (chronic) (peripheral): Secondary | ICD-10-CM | POA: Insufficient documentation

## 2019-02-28 DIAGNOSIS — K219 Gastro-esophageal reflux disease without esophagitis: Secondary | ICD-10-CM

## 2019-02-28 DIAGNOSIS — Z79899 Other long term (current) drug therapy: Secondary | ICD-10-CM

## 2019-02-28 DIAGNOSIS — I890 Lymphedema, not elsewhere classified: Secondary | ICD-10-CM | POA: Insufficient documentation

## 2019-02-28 DIAGNOSIS — Z791 Long term (current) use of non-steroidal anti-inflammatories (NSAID): Secondary | ICD-10-CM

## 2019-02-28 LAB — BASIC METABOLIC PANEL
BUN: 20 mg/dL (ref 6–23)
CO2: 31 mEq/L (ref 19–32)
Calcium: 9.1 mg/dL (ref 8.4–10.5)
Chloride: 98 mEq/L (ref 96–112)
Creatinine, Ser: 1.1 mg/dL (ref 0.40–1.20)
GFR: 48.19 mL/min — ABNORMAL LOW (ref >60.00)
Glucose, Bld: 94 mg/dL (ref 70–99)
Potassium: 3.9 mEq/L (ref 3.5–5.1)
Sodium: 137 mEq/L (ref 135–145)

## 2019-02-28 LAB — MAGNESIUM: Magnesium: 1.7 mg/dL (ref 1.5–2.5)

## 2019-02-28 NOTE — Progress Notes (Signed)
MRN : [id number]  Claudia Andersen is a 78 y.o. (30-Jan-1941) female who presents with chief complaint of No chief complaint on file. Marland Kitchen  History of Present Illness:   Patient is seen for evaluation of leg pain and leg swelling. The patient first noticed the swelling remotely. The swelling is associated with pain and discoloration. The pain and swelling worsens with prolonged dependency and improves with elevation. The pain is unrelated to activity.  The patient notes that in the morning the legs are significantly improved but they steadily worsened throughout the course of the day. The patient also notes a steady worsening of the discoloration in the ankle and shin area.   The patient denies claudication symptoms.  The patient denies symptoms consistent with rest pain.  The patient denies and extensive history of DJD and LS spine disease.  The patient has no had any past angiography, interventions or vascular surgery.  Elevation makes the leg symptoms better, dependency makes them much worse. There is no history of ulcerations. The patient denies any recent changes in medications.  The patient has not been wearing graduated compression.  The patient denies a history of DVT or PE. There is no prior history of phlebitis. There is no history of primary lymphedema.  No history of malignancies. No history of trauma or groin or pelvic surgery. There is no history of radiation treatment to the groin or pelvis  The patient denies amaurosis fugax or recent TIA symptoms. There are no recent neurological changes noted. The patient denies recent episodes of angina or shortness of breath  No outpatient medications have been marked as taking for the 02/28/19 encounter (Appointment) with Delana Meyer, Dolores Lory, MD.    Past Medical History:  Diagnosis Date  . Ankle fracture, bimalleolar, closed, right, initial encounter 10/26/2018   d/t fall 10/26/18  . Arthritis   . Back abrasion    CRUSHED VERTEBRAE   . Closed right radial fracture 10/26/2018   s/p a fall  . Compression of lumbar vertebra ([place])    L1 and L4 (from fall November 05, 2018)  . Depression   . Gallstones   . GERD (gastroesophageal reflux disease)   . Migraines   . Pulmonary emboli (HCC)    history of emboli    Past Surgical History:  Procedure Laterality Date  . ABDOMINAL HYSTERECTOMY    . CATARACT EXTRACTION W/PHACO Right 03/27/2018   Procedure: CATARACT EXTRACTION PHACO AND INTRAOCULAR LENS PLACEMENT (IOC);  Surgeon: Birder Robson, MD;  Location: [facility] ORS;  Service: Ophthalmology;  Laterality: Right;  [place] 00:33 AP% 15.3 CDE 5.18 Fluid pack lot # [id number]  . CATARACT EXTRACTION W/PHACO Left 06/26/2018   Procedure: CATARACT EXTRACTION PHACO AND INTRAOCULAR LENS PLACEMENT ([place]);  Surgeon: Birder Robson, MD;  Location: [facility] ORS;  Service: Ophthalmology;  Laterality: Left;  [place] 00:38 AP% 15.2 CDE 5.80 Fluid pack lot # [id number]  . FRACTURE SURGERY     RIGHT RADIAL  . JOINT REPLACEMENT     LEFT TKR  . 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 TRIPLE SUBTALAR FUSION      Social History Social History   Tobacco Use  . Smoking status: Former Research scientist (life sciences)  . Smokeless tobacco: Never Used  Substance Use Topics  . Alcohol use: Yes    Comment: 5-7 glasses per week  . Drug use: Not on file  Family History Family History  Problem Relation Age of Onset  . Arthritis Mother   . Heart disease Mother   . Stroke Mother   . Arthritis Father   . Heart disease Father   . Stroke Father   . Breast cancer Neg Hx   . Colon cancer Neg Hx   No family history of bleeding/clotting disorders, porphyria or autoimmune disease   No Known Allergies   REVIEW OF SYSTEMS (Negative unless checked)  Constitutional: [] Weight loss  [] Fever  [] Chills Cardiac: [] Chest pain   [] Chest  pressure   [] Palpitations   [] Shortness of breath when laying flat   [] Shortness of breath with exertion. Vascular:  [] Pain in legs with walking   [x] Pain in legs at rest  [] History of DVT   [] Phlebitis   [x] Swelling in legs   [] Varicose veins   [] Non-healing ulcers Pulmonary:   [] Uses home oxygen   [] Productive cough   [] Hemoptysis   [] Wheeze  [] COPD   [] Asthma Neurologic:  [] Dizziness   [] Seizures   [] History of stroke   [] History of TIA  [] Aphasia   [] Vissual changes   [] Weakness or numbness in arm   [] Weakness or numbness in leg Musculoskeletal:   [] Joint swelling   [] Joint pain   [] Low back pain Hematologic:  [] Easy bruising  [] Easy bleeding   [] Hypercoagulable state   [] Anemic Gastrointestinal:  [] Diarrhea   [] Vomiting  [] Gastroesophageal reflux/heartburn   [] Difficulty swallowing. Genitourinary:  [] Chronic kidney disease   [] Difficult urination  [] Frequent urination   [] Blood in urine Skin:  [] Rashes   [] Ulcers  Psychological:  [] History of anxiety   []  History of major depression.  Physical Examination  There were no vitals filed for this visit. There is no height or weight on file to calculate BMI. Gen: WD/WN, NAD Head: [place]/AT, No temporalis wasting.  Ear/Nose/Throat: Hearing grossly intact, nares w/o erythema or drainage, poor dentition Eyes: PER, EOMI, sclera nonicteric.  Neck: Supple, no masses.  No bruit or JVD.  Pulmonary:  Good air movement, clear to auscultation bilaterally, no use of accessory muscles.  Cardiac: RRR, normal S1, S2, no Murmurs. Vascular: scattered varicosities present bilaterally.  Mild venous stasis changes to the legs bilaterally.  3+ soft pitting edema Vessel Right Left  Radial Palpable Palpable  PT Palpable Palpable  DP Palpable Palpable  Gastrointestinal: soft, non-distended. No guarding/no peritoneal signs.  Musculoskeletal: M/S 5/5 throughout.  No deformity or atrophy.  Neurologic: CN 2-12 intact. Pain and light touch intact in extremities.   Symmetrical.  Speech is fluent. Motor exam as listed above. Psychiatric: Judgment intact, Mood & affect appropriate for pt's clinical situation. Dermatologic: Venous rashes no ulcers noted.  No changes consistent with cellulitis. Lymph : No Cervical lymphadenopathy, no lichenification or skin changes of chronic lymphedema.  CBC Lab Results  Component Value Date   WBC 10.6 (H) 01/11/2019   HGB 9.8 (L) 01/11/2019   HCT 29.9 (L) 01/11/2019   MCV 99.0 01/11/2019   PLT 243 01/11/2019    BMET    Component Value Date/Time   NA 137 02/27/2019 1558   K 3.9 02/27/2019 1558   CL 98 02/27/2019 1558   CO2 31 02/27/2019 1558   GLUCOSE 94 02/27/2019 1558   BUN 20 02/27/2019 1558   CREATININE 1.10 02/27/2019 1558   CALCIUM 9.1 02/27/2019 1558   GFRNONAA >60 01/10/2019 0240   GFRAA >60 01/10/2019 0240   Estimated Creatinine Clearance: 42.7 mL/min (by C-G formula based on SCr of 1.1 mg/dL).  COAG Lab  Results  Component Value Date   INR 1.1 (H) 11/26/2018    Radiology US Venous Img Lower Bilateral  Result Date: 02/15/2019 CLINICAL DATA:  78 year old female with edema EXAM: BILATERAL LOWER EXTREMITY VENOUS DOPPLER ULTRASOUND TECHNIQUE: Gray-scale sonography with graded compression, as well as color Doppler and duplex ultrasound were performed to evaluate the lower extremity deep venous systems from the level of the common femoral vein and including the common femoral, femoral, profunda femoral, popliteal and calf veins including the posterior tibial, peroneal and gastrocnemius veins when visible. The superficial great saphenous vein was also interrogated. Spectral Doppler was utilized to evaluate flow at rest and with distal augmentation maneuvers in the common femoral, femoral and popliteal veins. COMPARISON:  None. FINDINGS: RIGHT LOWER EXTREMITY Common Femoral Vein: No evidence of thrombus. Normal compressibility, respiratory phasicity and response to augmentation. Saphenofemoral Junction: No  evidence of thrombus. Normal compressibility and flow on color Doppler imaging. Profunda Femoral Vein: No evidence of thrombus. Normal compressibility and flow on color Doppler imaging. Femoral Vein: No evidence of thrombus. Normal compressibility, respiratory phasicity and response to augmentation. Popliteal Vein: No evidence of thrombus. Normal compressibility, respiratory phasicity and response to augmentation. Calf Veins: No evidence of thrombus. Normal compressibility and flow on color Doppler imaging. Superficial Great Saphenous Vein: No evidence of thrombus. Normal compressibility and flow on color Doppler imaging. Other Findings:  None. LEFT LOWER EXTREMITY Common Femoral Vein: No evidence of thrombus. Normal compressibility, respiratory phasicity and response to augmentation. Saphenofemoral Junction: No evidence of thrombus. Normal compressibility and flow on color Doppler imaging. Profunda Femoral Vein: No evidence of thrombus. Normal compressibility and flow on color Doppler imaging. Femoral Vein: No evidence of thrombus. Normal compressibility, respiratory phasicity and response to augmentation. Popliteal Vein: No evidence of thrombus. Normal compressibility, respiratory phasicity and response to augmentation. Calf Veins: No evidence of thrombus. Normal compressibility and flow on color Doppler imaging. Superficial Great Saphenous Vein: No evidence of thrombus. Normal compressibility and flow on color Doppler imaging. Other Findings:  None. IMPRESSION: Sonographic survey of the bilateral lower extremities negative for DVT Electronically Signed   By: Corrie Mckusick D.O.   On: 02/15/2019 15:07     Assessment/Plan 1. Chronic venous insufficiency I have had a long discussion with the patient regarding swelling and why it  causes symptoms.  Patient will begin wearing graduated compression stockings class 1 (20-30 mmHg) on a daily basis a prescription was given. The patient will  beginning wearing the  stockings first thing in the morning and removing them in the evening. The patient is instructed specifically not to sleep in the stockings.   In addition, behavioral modification will be initiated.  This will include frequent elevation, use of over the counter pain medications and exercise such as walking.  I have reviewed systemic causes for chronic edema such as liver, kidney and cardiac etiologies.  The patient denies problems with these organ systems.    Consideration for a lymph pump will also be made based upon the effectiveness of conservative therapy.  This would help to improve the edema control and prevent sequela such as ulcers and infections   Patient should undergo duplex ultrasound of the venous system to ensure that DVT or reflux is not present.  The patient will follow-up with me after the ultrasound.    2. Lymphedema I have had a long discussion with the patient regarding swelling and why it  causes symptoms.  Patient will begin wearing graduated compression stockings class 1 (20-30 mmHg) on  a daily basis a prescription was given. The patient will  beginning wearing the stockings first thing in the morning and removing them in the evening. The patient is instructed specifically not to sleep in the stockings.   In addition, behavioral modification will be initiated.  This will include frequent elevation, use of over the counter pain medications and exercise such as walking.  I have reviewed systemic causes for chronic edema such as liver, kidney and cardiac etiologies.  The patient denies problems with these organ systems.    Consideration for a lymph pump will also be made based upon the effectiveness of conservative therapy.  This would help to improve the edema control and prevent sequela such as ulcers and infections   Patient should undergo duplex ultrasound of the venous system to ensure that DVT or reflux is not present.  The patient will follow-up with me after the  ultrasound.    3. Bilateral carotid artery stenosis This is followed by another practice  4. Gastroesophageal reflux disease, esophagitis presence not specified Continue PPI as already ordered, this medication has been reviewed and there are no changes at this time.  Avoidence of caffeine and alcohol  Moderate elevation of the head of the bed   5. DDD (degenerative disc disease), lumbosacral Continue NSAID medications as already ordered, these medications have been reviewed and there are no changes at this time.  Continued activity and therapy was stressed.     Hortencia Pilar, MD  02/28/2019 12:59 PM

## 2019-03-04 ENCOUNTER — Telehealth: Payer: Self-pay | Admitting: Family

## 2019-03-04 NOTE — Telephone Encounter (Signed)
I spoke with patient & she stated that swelling was better. She still has rash & scaly skin. Her appointment with vascular went well & there was no blockages. They did not find any reason for her to have the leg swelling.   She wanted to f/u with you & she is scheduled Wednesday morning at 9am.

## 2019-03-04 NOTE — Telephone Encounter (Signed)
Noted  

## 2019-03-04 NOTE — Telephone Encounter (Signed)
Call pt She hasnt read mychart result note  Please ask  How is leg swelling?How did appointment with vascular go last week?

## 2019-03-04 NOTE — Assessment & Plan Note (Signed)
Acute on chronic. Worsened by rollator. Advised prn use of mobic. She will let me know how she is doing.

## 2019-03-04 NOTE — Assessment & Plan Note (Addendum)
Pleased with overall improvement. Suspect venous insufficiency.  Advised that she may continue taking Lasix daily. Advised may take 40 mg of Lasix daily for a couple of days, if needed. Pending vascular consult. She declines repeat of duplex. Will cover with bactroban for secondary bacterial infection and she will complete doxycycline.

## 2019-03-06 ENCOUNTER — Encounter: Payer: Self-pay | Admitting: Family

## 2019-03-06 ENCOUNTER — Ambulatory Visit (INDEPENDENT_AMBULATORY_CARE_PROVIDER_SITE_OTHER): Payer: Medicare Other | Admitting: Family

## 2019-03-06 ENCOUNTER — Other Ambulatory Visit: Payer: Self-pay

## 2019-03-06 DIAGNOSIS — I872 Venous insufficiency (chronic) (peripheral): Secondary | ICD-10-CM

## 2019-03-06 DIAGNOSIS — M5137 Other intervertebral disc degeneration, lumbosacral region: Secondary | ICD-10-CM

## 2019-03-06 NOTE — Progress Notes (Addendum)
This visit type was conducted due to national recommendations for restrictions regarding the COVID-19 pandemic (e.g. social distancing).  This format is felt to be most appropriate for this patient at this time.  All issues noted in this document were discussed and addressed.  No physical exam was performed (except for noted visual exam findings with Video Visits). Virtual Visit via Video Note  I connected with@  on 03/06/19 at  9:00 AM EDT by a video enabled telemedicine application and verified that I am speaking with the correct person using two identifiers.  Location patient: home Location provider:work Persons participating in the virtual visit: patient, provider  I discussed the limitations of evaluation and management by telemedicine and the availability of in person appointments. The patient expressed understanding and agreed to proceed.  Interactive audio and video telecommunications were attempted between this provider and patient, however failed, due to patient having technical difficulties or patient did not have access to video capability.  We continued and completed visit with audio only.   HPI:  Leg swelling- improved. Redness has improved. Completed doxycycline. Using bactroban. No weeping, calf swelling, sob, cp, orthopnea.  Purchased support hose.   Back pain on improved on mobic.     ROS: See pertinent positives and negatives per HPI.  Past Medical History:  Diagnosis Date  . Ankle fracture, bimalleolar, closed, right, initial encounter 10/26/2018   d/t fall 10/26/18  . Arthritis   . Back abrasion    CRUSHED VERTEBRAE  . Closed right radial fracture 10/26/2018   s/p a fall  . Compression of lumbar vertebra ([place])    L1 and L4 (from fall November 05, 2018)  . Depression   . Gallstones   . GERD (gastroesophageal reflux disease)   . Migraines   . Pulmonary emboli (HCC)    history of emboli    Past Surgical History:  Procedure Laterality Date  . ABDOMINAL  HYSTERECTOMY    . CATARACT EXTRACTION W/PHACO Right 03/27/2018   Procedure: CATARACT EXTRACTION PHACO AND INTRAOCULAR LENS PLACEMENT ([facility]);  Surgeon: Birder Robson, MD;  Location: [facility];  Service: Ophthalmology;  Laterality: Right;  [place] 00:33 AP% 15.3 CDE 5.18 Fluid pack lot # [id number]  . CATARACT EXTRACTION W/PHACO Left 06/26/2018   Procedure: CATARACT EXTRACTION PHACO AND INTRAOCULAR LENS PLACEMENT ([place]);  Surgeon: Birder Robson, MD;  Location: [facility] ORS;  Service: Ophthalmology;  Laterality: Left;  [place] 00:38 AP% 15.2 CDE 5.80 Fluid pack lot # [id number]  . FRACTURE SURGERY     RIGHT RADIAL  . JOINT REPLACEMENT     LEFT TKR  . 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 TRIPLE SUBTALAR FUSION      Family History  Problem Relation Age of Onset  . Arthritis Mother   . Heart disease Mother   . Stroke Mother   . Arthritis Father   . Heart disease Father   . Stroke Father   . Breast cancer Neg Hx   . Colon cancer Neg Hx     SOCIAL HX: former smoker   Current Outpatient Medications:  .  acetaminophen (TYLENOL) 500 MG tablet, Take 500 mg by mouth 2 (two) times daily as needed (PAIN.)., Disp: , Rfl:  .  aspirin 81 MG chewable tablet, Chew 1 tablet (81 mg total) by mouth 2 (two) times daily., Disp: 60 tablet, Rfl: 0 .  atorvastatin (LIPITOR)  10 MG tablet, Take 1 tablet (10 mg total) by mouth daily. (Patient taking differently: Take 10 mg by mouth every evening. ), Disp: 90 tablet, Rfl: 1 .  Biotin 2500 MCG CAPS, Take 2,500 mcg by mouth daily., Disp: , Rfl:  .  Calcium Carb-Cholecalciferol (CALCIUM 600 + D PO), Take 2 tablets by mouth daily. , Disp: , Rfl:  .  Fluticasone Furoate (FLONASE SENSIMIST NA), Place 1 spray into both nostrils daily as needed (allergies)., Disp: , Rfl:  .  fluvoxaMINE (LUVOX) 100 MG tablet, Take 0.5-1  tablets (50-100 mg total) by mouth daily. (Patient taking differently: Take 100 mg by mouth daily. ), Disp: 90 tablet, Rfl: 1 .  furosemide (LASIX) 20 MG tablet, Take 1 tablet (20 mg total) by mouth daily., Disp: 30 tablet, Rfl: 3 .  gabapentin (NEURONTIN) 300 MG capsule, Take 1 capsule (300 mg total) by mouth at bedtime., Disp: 90 capsule, Rfl: 3 .  meloxicam (MOBIC) 7.5 MG tablet, Take 1 tablet (7.5 mg total) by mouth daily as needed for pain., Disp: 90 tablet, Rfl: 0 .  mupirocin ointment (BACTROBAN) 2 %, Apply 1 application topically 2 (two) times daily., Disp: 22 g, Rfl: 0 .  pantoprazole (PROTONIX) 40 MG tablet, Take 1 tablet (40 mg total) by mouth 2 (two) times daily., Disp: 180 tablet, Rfl: 3 .  potassium chloride (K-DUR) 10 MEQ tablet, Take one tablet PO daily while on HCTZ ( diuretic)., Disp: 30 tablet, Rfl: 0 .  traZODone (DESYREL) 50 MG tablet, Take 50-100 mg by mouth at bedtime as needed for sleep. , Disp: , Rfl:  .  HYDROcodone-acetaminophen (NORCO) 7.5-325 MG tablet, Take 1-2 tablets by mouth every 4 (four) hours as needed for severe pain (pain score 7-10). (Patient not taking: Reported on 03/06/2019), Disp: 30 tablet, Rfl: 0  ASSESSMENT AND PLAN:  Discussed the following assessment and plan:  Chronic venous insufficiency  DDD (degenerative disc disease), lumbosacral  Problem List Items Addressed This Visit      Cardiovascular and Mediastinum   Chronic venous insufficiency    Improved.  Patient has been seen by vascular, appears lymphedema, venous insufficiency ( although note is  Not closed at this time to see full A/p). Advised patient she can do short burst of Lasix as she did not do last week.  She may take 40 mg for 1 to 3 days with potassium chloride.  She will then resume 20 mg of Lasix with Kcl.  Advised that I would like to get to the point where she can take Lasix a couple times a week or as needed for maintenance.  She will start wearing compression stockings.  She  declines additional dose of doxycycline however she will continue Bactroban.  Advised stay vigilant regards to infection and let me know if swelling does not resolve , at that point discussed contacting Dr. Henrine Screws office in regards to [place]        Musculoskeletal and Integument   DDD (degenerative disc disease), lumbosacral    Improved on Mobic prn. She will follow with orthopedics for additional concerns.            I discussed the assessment and treatment plan with the patient. The patient was provided an opportunity to ask questions and all were answered. The patient agreed with the plan and demonstrated an understanding of the instructions.   The patient was advised to call back or seek an in-person evaluation if the symptoms worsen or if the condition fails to improve  as anticipated.   Claudia Paris, FNP   I spent 15 min face to face w/ pt.

## 2019-03-06 NOTE — Assessment & Plan Note (Signed)
Improved on Mobic prn. She will follow with orthopedics for additional concerns.

## 2019-03-06 NOTE — Patient Instructions (Signed)
As discussed, let us do a short increase course of Lasix.  You may take 40 mg anywhere from 1 to 3 days.  Please take this with potassium chloride as you currently do.  After which,  you may resume 20 mg of Lasix with potassium chloride daily.  At some point, I would like you to start maintenance therapy at home which you take Lasix a couple times a week or just as needed altogether.   Let me know how you are doing.  Pleasure speaking with you!

## 2019-03-06 NOTE — Assessment & Plan Note (Signed)
Improved.  Patient has been seen by vascular, appears lymphedema, venous insufficiency ( although note is  Not closed at this time to see full A/p). Advised patient she can do short burst of Lasix as she did not do last week.  She may take 40 mg for 1 to 3 days with potassium chloride.  She will then resume 20 mg of Lasix with Kcl.  Advised that I would like to get to the point where she can take Lasix a couple times a week or as needed for maintenance.  She will start wearing compression stockings.  She declines additional dose of doxycycline however she will continue Bactroban.  Advised stay vigilant regards to infection and let me know if swelling does not resolve , at that point discussed contacting Dr. Henrine Screws office in regards to [place]

## 2019-03-15 ENCOUNTER — Telehealth: Payer: Self-pay | Admitting: Family

## 2019-03-15 DIAGNOSIS — R600 Localized edema: Secondary | ICD-10-CM

## 2019-03-15 NOTE — Telephone Encounter (Signed)
Call pt  How about we trial lasix 40mg  MWF and other days she may take 20mg  to see if swelling resolves? We may end up thinking she needs the 40mg  daily.   She will need to take the potassium chloride one tablet daily  Sch labs to check potassium, kidney function in another week to ensure we dont need to increase her potassium chloride to 2 tabs per day.

## 2019-03-15 NOTE — Telephone Encounter (Signed)
noted 

## 2019-03-15 NOTE — Telephone Encounter (Signed)
I called patient & she would like to do the trail of 40mg  lasix MWF. She stated that swelling is better, but still not completely gone. She said there was no redness only dry, scaly skin. She will try increase next week & labs scheduled Monday 03/25/19.

## 2019-03-15 NOTE — Addendum Note (Signed)
Addended by: Burnard Hawthorne on: 03/15/2019 09:18 AM   Modules accepted: Level of Service

## 2019-03-20 ENCOUNTER — Encounter (INDEPENDENT_AMBULATORY_CARE_PROVIDER_SITE_OTHER): Payer: Self-pay | Admitting: Vascular Surgery

## 2019-03-21 ENCOUNTER — Ambulatory Visit: Payer: Medicare Other

## 2019-03-25 ENCOUNTER — Other Ambulatory Visit (INDEPENDENT_AMBULATORY_CARE_PROVIDER_SITE_OTHER): Payer: Medicare Other

## 2019-03-25 ENCOUNTER — Other Ambulatory Visit: Payer: Self-pay

## 2019-03-25 DIAGNOSIS — R600 Localized edema: Secondary | ICD-10-CM

## 2019-03-26 LAB — BASIC METABOLIC PANEL
BUN: 21 mg/dL (ref 6–23)
CO2: 26 mEq/L (ref 19–32)
Calcium: 9.1 mg/dL (ref 8.4–10.5)
Chloride: 104 mEq/L (ref 96–112)
Creatinine, Ser: 0.83 mg/dL (ref 0.40–1.20)
GFR: 66.69 mL/min (ref >60.00)
Glucose, Bld: 104 mg/dL — ABNORMAL HIGH (ref 70–99)
Potassium: 4.2 mEq/L (ref 3.5–5.1)
Sodium: 137 mEq/L (ref 135–145)

## 2019-03-26 LAB — MAGNESIUM: Magnesium: 2 mg/dL (ref 1.5–2.5)

## 2019-03-28 ENCOUNTER — Telehealth (INDEPENDENT_AMBULATORY_CARE_PROVIDER_SITE_OTHER): Payer: Self-pay

## 2019-03-28 NOTE — Telephone Encounter (Signed)
Patient information was sent to [facility] so the patient could be fitting for a Lympedema pump to help with her condition. This is a response from [place] contacting the patient. This was emailed to me on Wednesday 03/27/2019.   HI Merwyn Katos all is well. I wanted to let you know that patient MRN [id number] says she has nothing wrong with her, the doctor is wrong, and she doesn't need anything. She would not let us see her.  I'll move her case to closed unless you tell me otherwise.  Thanks!

## 2019-04-02 ENCOUNTER — Ambulatory Visit: Payer: Medicare Other

## 2019-05-02 ENCOUNTER — Encounter (INDEPENDENT_AMBULATORY_CARE_PROVIDER_SITE_OTHER): Payer: Self-pay | Admitting: Vascular Surgery

## 2019-05-02 ENCOUNTER — Ambulatory Visit (INDEPENDENT_AMBULATORY_CARE_PROVIDER_SITE_OTHER): Payer: Medicare Other | Admitting: Vascular Surgery

## 2019-05-02 ENCOUNTER — Other Ambulatory Visit: Payer: Self-pay

## 2019-05-02 VITALS — BP 132/75 | HR 75 | Resp 16 | Wt 165.6 lb

## 2019-05-02 DIAGNOSIS — I872 Venous insufficiency (chronic) (peripheral): Secondary | ICD-10-CM | POA: Diagnosis not present

## 2019-05-02 DIAGNOSIS — K219 Gastro-esophageal reflux disease without esophagitis: Secondary | ICD-10-CM

## 2019-05-02 DIAGNOSIS — Z87891 Personal history of nicotine dependence: Secondary | ICD-10-CM

## 2019-05-02 DIAGNOSIS — I890 Lymphedema, not elsewhere classified: Secondary | ICD-10-CM

## 2019-05-02 DIAGNOSIS — Z79899 Other long term (current) drug therapy: Secondary | ICD-10-CM

## 2019-05-02 DIAGNOSIS — I6523 Occlusion and stenosis of bilateral carotid arteries: Secondary | ICD-10-CM | POA: Diagnosis not present

## 2019-05-02 DIAGNOSIS — M5137 Other intervertebral disc degeneration, lumbosacral region: Secondary | ICD-10-CM

## 2019-05-02 NOTE — Progress Notes (Signed)
MRN : [id number]  Claudia Andersen is a 78 y.o. (15-Nov-1940) female who presents with chief complaint of  Chief Complaint  Patient presents with  . Follow-up    79month follow up  .  History of Present Illness:   The patient returns to the office for followup evaluation regarding leg swelling.  The swelling has persisted and the pain associated with swelling continues. There have not been any interval development of a ulcerations or wounds.  Since the previous visit the patient has been wearing graduated compression stockings and has noted little if any improvement in the lymphedema. The patient has been using compression routinely morning until night.  The patient also states elevation during the day and exercise is being done too.   Current Meds  Medication Sig  . acetaminophen (TYLENOL) 500 MG tablet Take 500 mg by mouth 2 (two) times daily as needed (PAIN.).  Marland Kitchen aspirin 81 MG chewable tablet Chew 1 tablet (81 mg total) by mouth 2 (two) times daily.  Marland Kitchen atorvastatin (LIPITOR) 10 MG tablet Take 1 tablet (10 mg total) by mouth daily. (Patient taking differently: Take 10 mg by mouth every evening. )  . Biotin 2500 MCG CAPS Take 2,500 mcg by mouth daily.  . Calcium Carb-Cholecalciferol (CALCIUM 600 + D PO) Take 2 tablets by mouth daily.   . Fluticasone Furoate (FLONASE SENSIMIST NA) Place 1 spray into both nostrils daily as needed (allergies).  . furosemide (LASIX) 20 MG tablet Take 1 tablet (20 mg total) by mouth daily.  Marland Kitchen gabapentin (NEURONTIN) 300 MG capsule Take 1 capsule (300 mg total) by mouth at bedtime.  . hydrochlorothiazide (MICROZIDE) 12.5 MG capsule   . HYDROcodone-acetaminophen (NORCO) 7.5-325 MG tablet   . pantoprazole (PROTONIX) 40 MG tablet Take 1 tablet (40 mg total) by mouth 2 (two) times daily.  . potassium chloride (K-DUR) 10 MEQ tablet Take one tablet PO daily while on HCTZ ( diuretic).  Marland Kitchen traZODone (DESYREL) 50 MG tablet Take 50-100 mg by mouth at bedtime as needed  for sleep.     Past Medical History:  Diagnosis Date  . Ankle fracture, bimalleolar, closed, right, initial encounter 10/26/2018   d/t fall 10/26/18  . Arthritis   . Back abrasion    CRUSHED VERTEBRAE  . Closed right radial fracture 10/26/2018   s/p a fall  . Compression of lumbar vertebra ([place])    L1 and L4 (from fall November 05, 2018)  . Depression   . Gallstones   . GERD (gastroesophageal reflux disease)   . Migraines   . Pulmonary emboli (HCC)    history of emboli    Past Surgical History:  Procedure Laterality Date  . ABDOMINAL HYSTERECTOMY    . CATARACT EXTRACTION W/PHACO Right 03/27/2018   Procedure: CATARACT EXTRACTION PHACO AND INTRAOCULAR LENS PLACEMENT (IOC);  Surgeon: Birder Robson, MD;  Location: [facility] ORS;  Service: Ophthalmology;  Laterality: Right;  [place] 00:33 AP% 15.3 CDE 5.18 Fluid pack lot # [id number]  . CATARACT EXTRACTION W/PHACO Left 06/26/2018   Procedure: CATARACT EXTRACTION PHACO AND INTRAOCULAR LENS PLACEMENT ([place]);  Surgeon: Birder Robson, MD;  Location: [facility] ORS;  Service: Ophthalmology;  Laterality: Left;  [place] 00:38 AP% 15.2 CDE 5.80 Fluid pack lot # [id number]  . FRACTURE SURGERY     RIGHT RADIAL  . JOINT REPLACEMENT     LEFT TKR  . 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   Procedure: TOTAL HIP ARTHROPLASTY ANTERIOR APPROACH;  Surgeon: Lovell Sheehan, MD;  Location: [facility]  ORS;  Service: Orthopedics;  Laterality: Right;  . TRIPLE SUBTALAR FUSION      Social History Social History   Tobacco Use  . Smoking status: Former Research scientist (life sciences)  . Smokeless tobacco: Never Used  Substance Use Topics  . Alcohol use: Yes    Comment: 5-7 glasses per week  . Drug use: Not on file    Family History Family History  Problem Relation Age of Onset  . Arthritis Mother   . Heart disease Mother   . Stroke Mother   . Arthritis Father   . Heart disease Father   . Stroke Father   . Breast cancer Neg Hx   . Colon cancer Neg Hx     No Known Allergies    REVIEW OF SYSTEMS (Negative unless checked)  Constitutional: [] Weight loss  [] Fever  [] Chills Cardiac: [] Chest pain   [] Chest pressure   [] Palpitations   [] Shortness of breath when laying flat   [] Shortness of breath with exertion. Vascular:  [] Pain in legs with walking   [] Pain in legs at rest  [] History of DVT   [] Phlebitis   [x] Swelling in legs   [] Varicose veins   [] Non-healing ulcers Pulmonary:   [] Uses home oxygen   [] Productive cough   [] Hemoptysis   [] Wheeze  [] COPD   [] Asthma Neurologic:  [] Dizziness   [] Seizures   [] History of stroke   [] History of TIA  [] Aphasia   [] Vissual changes   [] Weakness or numbness in arm   [] Weakness or numbness in leg Musculoskeletal:   [] Joint swelling   [x] Joint pain   [x] Low back pain Hematologic:  [] Easy bruising  [] Easy bleeding   [] Hypercoagulable state   [] Anemic Gastrointestinal:  [] Diarrhea   [] Vomiting  [x] Gastroesophageal reflux/heartburn   [] Difficulty swallowing. Genitourinary:  [] Chronic kidney disease   [] Difficult urination  [] Frequent urination   [] Blood in urine Skin:  [] Rashes   [] Ulcers  Psychological:  [] History of anxiety   []  History of major depression.  Physical Examination  Vitals:   05/02/19 1100  BP: 132/75  Pulse: 75  Resp: 16  Weight: 165 lb 9.6 oz (75.1 kg)   Body mass index is 30.29 kg/m. Gen: WD/WN, NAD Head: [place]/AT, No temporalis wasting.  Ear/Nose/Throat: Hearing grossly intact, nares w/o erythema or drainage Eyes: PER, EOMI, sclera nonicteric.  Neck: Supple, no large masses.   Pulmonary:  Good air movement, no audible wheezing bilaterally, no use of accessory muscles.  Cardiac: RRR, no JVD Vascular: scattered varicosities present bilaterally.  Moderate venous stasis changes to the legs bilaterally.  3-4+ soft pitting edema Vessel Right Left  Radial Palpable Palpable  PT Palpable Palpable  DP Palpable Palpable  Gastrointestinal: Non-distended. No guarding/no peritoneal signs.  Musculoskeletal: M/S 5/5  throughout.  No deformity or atrophy.  Neurologic: CN 2-12 intact. Symmetrical.  Speech is fluent. Motor exam as listed above. Psychiatric: Judgment intact, Mood & affect appropriate for pt's clinical situation. Dermatologic: venous rashes no ulcers noted.  No changes consistent with cellulitis. Lymph : No lichenification or skin changes of chronic lymphedema.  CBC Lab Results  Component Value Date   WBC 10.6 (H) 01/11/2019   HGB 9.8 (L) 01/11/2019   HCT 29.9 (L) 01/11/2019   MCV 99.0 01/11/2019   PLT 243 01/11/2019    BMET    Component Value Date/Time   NA 137 03/25/2019 1545   K 4.2 03/25/2019 1545   CL 104 03/25/2019 1545   CO2 26 03/25/2019 1545   GLUCOSE 104 (H) 03/25/2019 1545   BUN  21 03/25/2019 1545   CREATININE 0.83 03/25/2019 1545   CALCIUM 9.1 03/25/2019 1545   GFRNONAA >60 01/10/2019 0240   GFRAA >60 01/10/2019 0240   CrCl cannot be calculated (Patient's most recent lab result is older than the maximum 21 days allowed.).  COAG Lab Results  Component Value Date   INR 1.1 (H) 11/26/2018    Radiology No results found.    Assessment/Plan 1. Lymphedema Recommend:  No surgery or intervention at this point in time.    I have reviewed my previous discussion with the patient regarding swelling and why it causes symptoms.  Patient will continue wearing graduated compression stockings class 1 (20-30 mmHg) on a daily basis. The patient will  beginning wearing the stockings first thing in the morning and removing them in the evening. The patient is instructed specifically not to sleep in the stockings.    In addition, behavioral modification including several periods of elevation of the lower extremities during the day will be continued.  This was reviewed with the patient during the initial visit.  The patient will also continue routine exercise, especially walking on a daily basis as was discussed during the initial visit.    Despite conservative treatments  including graduated compression therapy class 1 and behavioral modification including exercise and elevation the patient  has not obtained adequate control of the lymphedema.  The patient still has stage 3 lymphedema and therefore, I believe that a lymph pump should be added to improve the control of the patient's lymphedema.  Additionally, a lymph pump is warranted because it will reduce the risk of cellulitis and ulceration in the future.  Patient should follow-up in six months    2. Chronic venous insufficiency Recommend:  No surgery or intervention at this point in time.    I have reviewed my previous discussion with the patient regarding swelling and why it causes symptoms.  Patient will continue wearing graduated compression stockings class 1 (20-30 mmHg) on a daily basis. The patient will  beginning wearing the stockings first thing in the morning and removing them in the evening. The patient is instructed specifically not to sleep in the stockings.    In addition, behavioral modification including several periods of elevation of the lower extremities during the day will be continued.  This was reviewed with the patient during the initial visit.  The patient will also continue routine exercise, especially walking on a daily basis as was discussed during the initial visit.    Despite conservative treatments including graduated compression therapy class 1 and behavioral modification including exercise and elevation the patient  has not obtained adequate control of the lymphedema.  The patient still has stage 3 lymphedema and therefore, I believe that a lymph pump should be added to improve the control of the patient's lymphedema.  Additionally, a lymph pump is warranted because it will reduce the risk of cellulitis and ulceration in the future.  Patient should follow-up in six months    3. Bilateral carotid artery stenosis Recommend:  Given the patient's asymptomatic subcritical stenosis  no further invasive testing or surgery at this time.  Duplex ultrasound shows <50% stenosis bilaterally.  Continue antiplatelet therapy as prescribed Continue management of CAD, HTN and Hyperlipidemia Healthy heart diet,  encouraged exercise at least 4 times per week Follow up at 12 month interval with duplex ultrasound and physical exam   4. Gastroesophageal reflux disease without esophagitis Continue PPI as already ordered, this medication has been reviewed and there are  no changes at this time.  Avoidence of caffeine and alcohol  Moderate elevation of the head of the bed   5. DDD (degenerative disc disease), lumbosacral Continue NSAID medications as already ordered, these medications have been reviewed and there are no changes at this time.  Continued activity and therapy was stressed.    Hortencia Pilar, MD  05/02/2019 11:28 AM

## 2019-05-08 ENCOUNTER — Encounter (INDEPENDENT_AMBULATORY_CARE_PROVIDER_SITE_OTHER): Payer: Self-pay | Admitting: Vascular Surgery

## 2019-05-22 ENCOUNTER — Other Ambulatory Visit: Payer: Self-pay | Admitting: Family

## 2019-05-22 DIAGNOSIS — F325 Major depressive disorder, single episode, in full remission: Secondary | ICD-10-CM

## 2019-05-24 ENCOUNTER — Other Ambulatory Visit: Payer: Self-pay | Admitting: Family

## 2019-06-14 DIAGNOSIS — M545 Low back pain, unspecified: Secondary | ICD-10-CM | POA: Insufficient documentation

## 2019-07-23 ENCOUNTER — Other Ambulatory Visit: Payer: Self-pay

## 2019-07-23 ENCOUNTER — Ambulatory Visit (INDEPENDENT_AMBULATORY_CARE_PROVIDER_SITE_OTHER): Payer: Medicare Other

## 2019-07-23 DIAGNOSIS — Z0000 Encounter for general adult medical examination without abnormal findings: Secondary | ICD-10-CM | POA: Diagnosis not present

## 2019-07-23 NOTE — Patient Instructions (Addendum)
  Claudia Andersen , Thank you for taking time to come for your Medicare Wellness Visit. I appreciate your ongoing commitment to your health goals. Please review the following plan we discussed and let me know if I can assist you in the future.   These are the goals we discussed: Goals      Patient Stated   . Lose Weight (pt-stated)     Healthy diet       This is a list of the screening recommended for you and due dates:  Health Maintenance  Topic Date Due  . Tetanus Vaccine  12/03/2017  . Flu Shot  01/15/2020*  . DEXA scan (bone density measurement)  Completed  . Pneumonia vaccines  Completed  *Topic was postponed. The date shown is not the original due date.

## 2019-07-23 NOTE — Progress Notes (Addendum)
Subjective:   Claudia Andersen is a 78 y.o. female who presents for Medicare Annual (Subsequent) preventive examination.  Review of Systems:  No ROS.  Medicare Wellness Virtual Visit.  Visual/audio telehealth visit, UTA vital signs.   See social history for additional risk factors.   Cardiac Risk Factors include: advanced age (>62men, >55 women)     Objective:     Vitals: There were no vitals taken for this visit.  There is no height or weight on file to calculate BMI.  Advanced Directives 07/23/2019 01/09/2019 07/19/2018 06/26/2018  Does Patient Have a Medical Advance Directive? Yes Yes Yes Yes  Type of Advance Directive Living will Living will [place];Living will [place];Living will  Does patient want to make changes to medical advance directive? No - Patient declined No - Patient declined No - Patient declined No - Patient declined  Copy of [place] in Chart? - - No - copy requested No - copy requested    Tobacco Social History   Tobacco Use  Smoking Status Former Smoker  Smokeless Tobacco Never Used     Counseling given: Not Answered   Clinical Intake:                       Past Medical History:  Diagnosis Date  . Ankle fracture, bimalleolar, closed, right, initial encounter 10/26/2018   d/t fall 10/26/18  . Arthritis   . Back abrasion    CRUSHED VERTEBRAE  . Closed right radial fracture 10/26/2018   s/p a fall  . Compression of lumbar vertebra ([place])    L1 and L4 (from fall November 05, 2018)  . Depression   . Gallstones   . GERD (gastroesophageal reflux disease)   . Migraines   . Pulmonary emboli (HCC)    history of emboli   Past Surgical History:  Procedure Laterality Date  . ABDOMINAL HYSTERECTOMY    . CATARACT EXTRACTION W/PHACO Right 03/27/2018   Procedure: CATARACT EXTRACTION PHACO AND INTRAOCULAR LENS PLACEMENT (IOC);  Surgeon: Birder Robson, MD;  Location: [facility] ORS;   Service: Ophthalmology;  Laterality: Right;  [place] 00:33 AP% 15.3 CDE 5.18 Fluid pack lot # [id number]  . CATARACT EXTRACTION W/PHACO Left 06/26/2018   Procedure: CATARACT EXTRACTION PHACO AND INTRAOCULAR LENS PLACEMENT ([place]);  Surgeon: Birder Robson, MD;  Location: [facility] ORS;  Service: Ophthalmology;  Laterality: Left;  [place] 00:38 AP% 15.2 CDE 5.80 Fluid pack lot # [id number]  . FRACTURE SURGERY     RIGHT RADIAL  . JOINT REPLACEMENT     LEFT TKR  . 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 TRIPLE SUBTALAR FUSION     Family History  Problem Relation Age of Onset  . Arthritis Mother   . Heart disease Mother   . Stroke Mother   . Arthritis Father   . Heart disease Father   . Stroke Father   . Breast cancer Neg Hx   . Colon cancer Neg Hx    Social History   Socioeconomic History  . Marital status: Divorced    Spouse name: Not on file  . Number of children: Not on file  . Years of education: Not on file  . Highest education level: Not on file  Occupational History  . Not on file  Social Needs  . Financial  resource strain: Not hard at all  . Food insecurity    Worry: Never true    Inability: Never true  . Transportation needs    Medical: No    Non-medical: No  Tobacco Use  . Smoking status: Former Research scientist (life sciences)  . Smokeless tobacco: Never Used  Substance and Sexual Activity  . Alcohol use: Yes    Comment: 5-7 glasses per week  . Drug use: Not on file  . Sexual activity: Not on file  Lifestyle  . Physical activity    Days per week: 7 days    Minutes per session: 60 min  . Stress: Not at all  Relationships  . Social Herbalist on phone: Not on file    Gets together: Not on file    Attends religious service: Not on file    Active member of club or organization: Not on file    Attends meetings of clubs or  organizations: Not on file    Relationship status: Not on file  Other Topics Concern  . 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     Outpatient Encounter Medications as of 07/23/2019  Medication Sig  . acetaminophen (TYLENOL) 500 MG tablet Take 500 mg by mouth 2 (two) times daily as needed (PAIN.).  Marland Kitchen aspirin 81 MG chewable tablet Chew 1 tablet (81 mg total) by mouth 2 (two) times daily.  Marland Kitchen atorvastatin (LIPITOR) 10 MG tablet TAKE ONE TABLET BY MOUTH DAILY  . Biotin 2500 MCG CAPS Take 2,500 mcg by mouth daily.  . Calcium Carb-Cholecalciferol (CALCIUM 600 + D PO) Take 2 tablets by mouth daily.   . cyclobenzaprine (FLEXERIL) 10 MG tablet Take 10 mg by mouth 3 (three) times daily as needed for muscle spasms.  . Fluticasone Furoate (FLONASE SENSIMIST NA) Place 1 spray into both nostrils daily as needed (allergies).  . fluvoxaMINE (LUVOX) 100 MG tablet TAKE ONE-HALF TO ONE TABLET BY MOUTH DAILY  . furosemide (LASIX) 20 MG tablet Take 1 tablet (20 mg total) by mouth daily.  Marland Kitchen gabapentin (NEURONTIN) 300 MG capsule Take 1 capsule (300 mg total) by mouth at bedtime.  . hydrochlorothiazide (MICROZIDE) 12.5 MG capsule   . mupirocin ointment (BACTROBAN) 2 % Apply 1 application topically 2 (two) times daily.  . naproxen (NAPROSYN) 375 MG tablet naproxen 375 mg tablet  . pantoprazole (PROTONIX) 40 MG tablet Take 1 tablet (40 mg total) by mouth 2 (two) times daily.  . potassium chloride (K-DUR) 10 MEQ tablet Take one tablet PO daily while on HCTZ ( diuretic).  Marland Kitchen SHINGRIX injection   . traZODone (DESYREL) 50 MG tablet Take 50-100 mg by mouth at bedtime as needed for sleep.   . [DISCONTINUED] HYDROcodone-acetaminophen (NORCO) 7.5-325 MG tablet Take 1-2 tablets by mouth every 4 (four) hours as needed for severe pain (pain score 7-10). (Patient not taking: Reported on 03/06/2019)  . [DISCONTINUED] HYDROcodone-acetaminophen (NORCO) 7.5-325 MG tablet   . [DISCONTINUED]  meloxicam (MOBIC) 7.5 MG tablet Take 1 tablet (7.5 mg total) by mouth daily as needed for pain.   No facility-administered encounter medications on file as of 07/23/2019.     Activities of Daily Living In your present state of health, do you have any difficulty performing the following activities: 07/23/2019 01/09/2019  Hearing? N -  Vision? N -  Difficulty concentrating or making decisions? N -  Walking or climbing stairs? N -  Dressing or bathing? N -  Doing errands, shopping? N N  Preparing Food and eating ? N -  Using the Toilet? N -  In the past six months, have you accidently leaked urine? N -  Do you have problems with loss of bowel control? N -  Managing your Medications? N -  Managing your Finances? N -  Housekeeping or managing your Housekeeping? N -  Some recent data might be hidden    Patient Care Team: Burnard Hawthorne, FNP as PCP - General (Family Medicine)    Assessment:   This is a routine wellness examination for Gerarda.  I connected with patient 07/23/19 at 10:00 AM EDT by an audio enabled telemedicine application and verified that I am speaking with the correct person using two identifiers. Patient stated full name and DOB. Patient gave permission to continue with virtual visit. Patient's location was at home and Nurse's location was at [place] office.   Health Maintenance Due: -Influenza vaccine 2020- discussed; to be completed in season with doctor or local pharmacy.   -Tdap- discussed; to be completed with doctor in visit or local pharmacy.   Update all pending maintenance due as appropriate.   See completed HM at the end of note.   Eye: Visual acuity not assessed. Virtual visit.  Dental: UTD  Hearing: Demonstrates normal hearing during visit.  Safety:  Patient feels safe at home- yes Patient does have smoke detectors at home- yes Patient does wear sunscreen or protective clothing when in direct sunlight - yes Patient does wear seat belt when in  a moving vehicle - yes Patient drives- yes Adequate lighting in walkways free from debris- yes Grab bars and handrails used as appropriate- yes Ambulates with no assistive device Cell phone on person when ambulating outside of the home- yes  Social: Alcohol intake - yes      Smoking history- former  Smokers in home? none Illicit drug use? none  Depression: PHQ 2 &9 complete. See screening below. Denies irritability, anhedonia, sadness/tearfullness.  Stable.   Falls: See screening below.    Medication: Taking as directed and without issues.   Covid-19: Precautions and sickness symptoms discussed. Wears mask, social distancing, hand hygiene as appropriate.   Activities of Daily Living Patient denies needing assistance with: household chores, feeding themselves, getting from bed to chair, getting to the toilet, bathing/showering, dressing, managing money, or preparing meals.   Memory: Patient is alert. Patient denies difficulty focusing or concentrating. Correctly identified the president of the [place], season and recall. Patient likes to read for brain stimulation.  BMI- discussed the importance of a healthy diet, water intake and the benefits of aerobic exercise.  Educational material provided.  Physical activity- walking  Diet: regular Water: good intake  Other Providers Patient Care Team: Burnard Hawthorne, FNP as PCP - General (Family Medicine) Exercise Activities and Dietary recommendations Current Exercise Habits: Home exercise routine, Type of exercise: walking, Intensity: Mild  Goals      Patient Stated   . Lose Weight (pt-stated)     Healthy diet       Fall Risk Fall Risk  07/23/2019 07/19/2018 07/31/2017  Falls in the past year? 0 Yes No  Number falls in past yr: - 1 -  Injury with Fall? - No -  Comment - Bruise. No injury. Slipped on wet bath mat. Safety railing now installed.  -  Follow up - Falls prevention discussed;Education provided -   Timed  Get Up and Go performed: no, virtual visit  Depression Screen  PHQ 2/9 Scores 07/23/2019 02/20/2019 07/19/2018 07/31/2017  PHQ - 2 Score 0 0 0 0  PHQ- 9 Score - 0 - 0     Cognitive Function MMSE - Mini Mental State Exam 07/19/2018  Orientation to time 5  Orientation to Place 5  Registration 3  Attention/ Calculation 5  Recall 3  Language- name 2 objects 2  Language- repeat 1  Language- follow 3 step command 3  Language- read & follow direction 1  Write a sentence 1  Copy design 1  Total score 30     6CIT Screen 07/23/2019  What Year? 0 points  What month? 0 points  What time? 0 points  Count back from 20 0 points  Months in reverse 0 points  Repeat phrase 0 points  Total Score 0    Immunization History  Administered Date(s) Administered  . Influenza, High Dose Seasonal PF 07/13/2015, 07/31/2017, 07/19/2018  . Influenza, Seasonal, Injecte, Preservative Fre 08/15/2006, 07/21/2009, 07/15/2010, 07/18/2011, 06/30/2012  . Influenza,inj,Quad PF,6+ Mos 08/08/2014, 07/20/2016  . Pneumococcal Conjugate-13 08/18/2014, 07/20/2015  . Pneumococcal Polysaccharide-23 12/15/2008  . Td 12/04/2007  . Zoster 06/25/2012  . Zoster Recombinat (Shingrix) 11/30/2018, 03/19/2019   Screening Tests Health Maintenance  Topic Date Due  . TETANUS/TDAP  12/03/2017  . INFLUENZA VACCINE  01/15/2020 (Originally 05/18/2019)  . DEXA SCAN  Completed  . PNA vac Low Risk Adult  Completed      Plan:    Keep all routine maintenance appointments.   Medicare Attestation I have personally reviewed: The patient's medical and social history Their use of alcohol, tobacco or illicit drugs Their current medications and supplements The patient's functional ability including ADLs,fall risks, home safety risks, cognitive, and hearing and visual impairment Diet and physical activities Evidence for depression    In addition, I have reviewed and discussed with patient certain preventive protocols, quality metrics,  and best practice recommendations. A written personalized care plan for preventive services as well as general preventive health recommendations were provided to patient via mail.     OBrien-Blaney, Tilly Pernice L, LPN  [id number]   I have reviewed the above information and agree with above.   Deborra Medina, MD

## 2019-07-23 NOTE — Addendum Note (Signed)
Addended by: Dia Crawford on: 07/23/2019 03:15 PM   Modules accepted: Orders

## 2019-08-08 ENCOUNTER — Other Ambulatory Visit: Payer: Self-pay | Admitting: Family

## 2019-08-25 ENCOUNTER — Other Ambulatory Visit: Payer: Self-pay | Admitting: Family

## 2019-08-30 ENCOUNTER — Other Ambulatory Visit: Payer: Self-pay | Admitting: Family

## 2019-08-30 DIAGNOSIS — F325 Major depressive disorder, single episode, in full remission: Secondary | ICD-10-CM

## 2019-11-04 ENCOUNTER — Ambulatory Visit (INDEPENDENT_AMBULATORY_CARE_PROVIDER_SITE_OTHER): Payer: Medicare PPO | Admitting: Vascular Surgery

## 2019-11-21 ENCOUNTER — Other Ambulatory Visit: Payer: Self-pay | Admitting: Family

## 2019-11-21 DIAGNOSIS — F325 Major depressive disorder, single episode, in full remission: Secondary | ICD-10-CM

## 2019-11-27 ENCOUNTER — Other Ambulatory Visit: Payer: Self-pay | Admitting: Family

## 2019-11-27 DIAGNOSIS — F325 Major depressive disorder, single episode, in full remission: Secondary | ICD-10-CM

## 2019-11-28 ENCOUNTER — Other Ambulatory Visit: Payer: Self-pay | Admitting: Family

## 2019-12-03 ENCOUNTER — Telehealth: Payer: Self-pay | Admitting: Family

## 2019-12-03 NOTE — Telephone Encounter (Signed)
I have documented in patient's chart. 

## 2019-12-03 NOTE — Telephone Encounter (Signed)
Pt got the Moderna 1st shot 10/31/19 Second on 12/02/19

## 2019-12-04 ENCOUNTER — Other Ambulatory Visit: Payer: Self-pay | Admitting: Family

## 2020-02-07 ENCOUNTER — Other Ambulatory Visit: Payer: Self-pay

## 2020-02-07 ENCOUNTER — Encounter: Payer: Self-pay | Admitting: Nurse Practitioner

## 2020-02-07 ENCOUNTER — Ambulatory Visit: Payer: Medicare PPO | Admitting: Nurse Practitioner

## 2020-02-07 VITALS — BP 136/72 | HR 72 | Temp 97.3°F | Ht 62.0 in | Wt 168.8 lb

## 2020-02-07 DIAGNOSIS — S30860A Insect bite (nonvenomous) of lower back and pelvis, initial encounter: Secondary | ICD-10-CM | POA: Diagnosis not present

## 2020-02-07 DIAGNOSIS — W57XXXA Bitten or stung by nonvenomous insect and other nonvenomous arthropods, initial encounter: Secondary | ICD-10-CM | POA: Diagnosis not present

## 2020-02-07 DIAGNOSIS — I10 Essential (primary) hypertension: Secondary | ICD-10-CM

## 2020-02-07 MED ORDER — TRIAMCINOLONE ACETONIDE 0.1 % EX CREA: 1.0000 "application " | TOPICAL_CREAM | Freq: Two times a day (BID) | CUTANEOUS | 0 refills | Status: AC

## 2020-02-07 NOTE — Progress Notes (Signed)
Established Patient Office Visit  Subjective:  Patient ID: Claudia Andersen, female    DOB: 03-30-1941  Age: 79 y.o. MRN: [id number]  CC:  Chief Complaint  Patient presents with  . Acute Visit    tick bite, high BP    HPI Claudia Andersen presents for a check on an itchy tick bite site on her left upper back under her bra strap. She  had a tick removed by the nurse at her facility and reports the whole tick came out intact. She has had no HA, fever, body aches or rash. The site is itchy.  HTN: She has hydrochlorothiazide 12.5 mg daily as needed and Klor-Con 10 mEq as needed.  She has not been taking these medications.  She has noted her blood pressure has been slightly elevated.  She has not had any edema, chest pain shortness of breath.  BP Readings from Last 3 Encounters:  02/07/20 136/72  05/02/19 132/75  02/28/19 (!) 147/72    Past Medical History:  Diagnosis Date  . Ankle fracture, bimalleolar, closed, right, initial encounter 10/26/2018   d/t fall 10/26/18  . Arthritis   . Back abrasion    CRUSHED VERTEBRAE  . Closed right radial fracture 10/26/2018   s/p a fall  . Compression of lumbar vertebra ([place])    L1 and L4 (from fall November 05, 2018)  . Depression   . Gallstones   . GERD (gastroesophageal reflux disease)   . Migraines   . Pulmonary emboli (HCC)    history of emboli    Past Surgical History:  Procedure Laterality Date  . ABDOMINAL HYSTERECTOMY    . CATARACT EXTRACTION W/PHACO Right 03/27/2018   Procedure: CATARACT EXTRACTION PHACO AND INTRAOCULAR LENS PLACEMENT (IOC);  Surgeon: Birder Robson, MD;  Location: [facility];  Service: Ophthalmology;  Laterality: Right;  [place] 00:33 AP% 15.3 CDE 5.18 Fluid pack lot # [id number]  . CATARACT EXTRACTION W/PHACO Left 06/26/2018   Procedure: CATARACT EXTRACTION PHACO AND INTRAOCULAR LENS PLACEMENT ([place]);  Surgeon: Birder Robson, MD;  Location: [facility];  Service: Ophthalmology;  Laterality: Left;  [place] 00:38 AP%  15.2 CDE 5.80 Fluid pack lot # [id number]  . FRACTURE SURGERY     RIGHT RADIAL  . JOINT REPLACEMENT     LEFT TKR  . 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 TRIPLE SUBTALAR FUSION      Family History  Problem Relation Age of Onset  . Arthritis Mother   . Heart disease Mother   . Stroke Mother   . Arthritis Father   . Heart disease Father   . Stroke Father   . Breast cancer Neg Hx   . Colon cancer Neg Hx     Social History   Socioeconomic History  . Marital status: Divorced    Spouse name: Not on file  . Number of children: Not on file  . Years of education: Not on file  . Highest education level: Not on file  Occupational History  . Not on file  Tobacco Use  . Smoking status: Former Research scientist (life sciences)  . Smokeless tobacco: Never Used  Substance and Sexual Activity  . Alcohol use: Yes    Comment: 5-7 glasses per week  . Drug use: Not on file  . Sexual activity: Not on file  Other Topics Concern  . 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    Social Determinants of Health   Financial Resource Strain:   . Difficulty of Paying Living Expenses:   Food Insecurity:   . Worried About Charity fundraiser in the Last Year:   . Arboriculturist in the Last Year:   Transportation Needs:   . Film/video editor (Medical):   Marland Kitchen Lack of Transportation (Non-Medical):   Physical Activity:   . Days of Exercise per Week:   . Minutes of Exercise per Session:   Stress:   . Feeling of Stress :   Social Connections:   . Frequency of Communication with Friends and Family:   . Frequency of Social Gatherings with Friends and Family:   . Attends Religious Services:   . Active Member of Clubs or Organizations:   . Attends Archivist Meetings:   Marland Kitchen Marital Status:   Intimate Partner Violence:   .  Fear of Current or Ex-Partner:   . Emotionally Abused:   Marland Kitchen Physically Abused:   . Sexually Abused:     Outpatient Medications Prior to Visit  Medication Sig Dispense Refill  . acetaminophen (TYLENOL) 500 MG tablet Take 500 mg by mouth 2 (two) times daily as needed (PAIN.).    Marland Kitchen atorvastatin (LIPITOR) 10 MG tablet TAKE ONE TABLET BY MOUTH DAILY 90 tablet 0  . Biotin 2500 MCG CAPS Take 2,500 mcg by mouth daily.    . Calcium Carb-Cholecalciferol (CALCIUM 600 + D PO) Take 2 tablets by mouth daily.     . cyclobenzaprine (FLEXERIL) 10 MG tablet Take 10 mg by mouth 3 (three) times daily as needed for muscle spasms.    . Fluticasone Furoate (FLONASE SENSIMIST NA) Place 1 spray into both nostrils daily as needed (allergies).    . fluvoxaMINE (LUVOX) 100 MG tablet TAKE 1/2-1 TABLET BY MOUTH DAILY 90 tablet 0  . gabapentin (NEURONTIN) 300 MG capsule Take 1 capsule (300 mg total) by mouth at bedtime. 90 capsule 3  . mupirocin ointment (BACTROBAN) 2 % Apply 1 application topically 2 (two) times daily. 22 g 0  . naproxen (NAPROSYN) 375 MG tablet naproxen 375 mg tablet    . pantoprazole (PROTONIX) 40 MG tablet TAKE ONE TABLET BY MOUTH TWICE A DAY 180 tablet 2  . traZODone (DESYREL) 50 MG tablet Take 50-100 mg by mouth at bedtime as needed for sleep.     Marland Kitchen SHINGRIX injection     . aspirin 81 MG chewable tablet Chew 1 tablet (81 mg total) by mouth 2 (two) times daily. 60 tablet 0  . furosemide (LASIX) 20 MG tablet Take 1 tablet (20 mg total) by mouth daily. 30 tablet 3  . hydrochlorothiazide (MICROZIDE) 12.5 MG capsule     . potassium chloride (KLOR-CON) 10 MEQ tablet TAKE ONE TABLET BY MOUTH DAILY WHILE TAKING HCTZ (DIURETIC) (Patient not taking: Reported on 02/07/2020) 30 tablet 0   No facility-administered medications prior to visit.    No Known Allergies   Review of Systems    Objective:    Physical Exam  Skin:  Tick bite under left bra strap on the back. Looks like allergic slight hive at  site. No rash or cellulitis. Itchy.    BP 136/72   Pulse 72   Temp (!) 97.3 F (36.3 C)   Ht 5\' 2"  (1.575 m)   Wt 168 lb 12.8 oz (76.6 kg)   SpO2 99%  BMI 30.87 kg/m  Wt Readings from Last 3 Encounters:  02/07/20 168 lb 12.8 oz (76.6 kg)  05/02/19 165 lb 9.6 oz (75.1 kg)  02/28/19 174 lb (78.9 kg)     Health Maintenance Due  Topic Date Due  . TETANUS/TDAP  12/03/2017    There are no preventive care reminders to display for this patient.  Assessment & Plan:   Problem List Items Addressed This Visit    None    Visit Diagnoses    Tick bite of back, initial encounter    -  Primary   Essential hypertension          Meds ordered this encounter  Medications  . triamcinolone cream (KENALOG) 0.1 %    Sig: Apply 1 application topically 2 (two) times daily for 7 days.    Dispense:  14 g    Refill:  0    Order Specific Question:   Supervising Provider    Answer:   Einar Pheasant [id number]    I have ordered steroid cream with Eucerin.  Apply this twice daily to the itchy tick bite for 1 week.  Notify us if the tick bite seems to enlarge, become tender, or show any signs of infection or rash.  For routine blood pressure - take your current medications. Check BP at home and bring in record.   Follow-up: Return in about 2 weeks (around 02/21/2020).    Denice Paradise, NP

## 2020-02-07 NOTE — Patient Instructions (Addendum)
It was nice to meet you today.   I have ordered steroid cream with Eucerin.  Apply this twice daily to the itchy tick bite for 1 week.  Notify us if the tick bite seems to enlarge, become tender, or show any signs of infection or rash.  For routine blood pressure - take your current medications. Check BP at home and bring in record.   Follow-up with Joycelyn Schmid in 2 weeks.   Tick Bite Information, Adult  Ticks are insects that draw blood for food. Most ticks live in shrubs and grassy areas. They climb onto people and animals that brush against the leaves and grasses that they rest on. Then they bite, attaching themselves to the skin. Most ticks are harmless, but some ticks carry germs that can spread to a person through a bite and cause a disease. To reduce your risk of getting a disease from a tick bite, it is important to take steps to prevent tick bites. It is also important to check for ticks after being outdoors. If you find that a tick has attached to you, watch for symptoms of disease. How can I prevent tick bites? Take these steps to help prevent tick bites when you are outdoors in an area where ticks are found:  Use insect repellent that has DEET (20% or higher), picaridin, or IR3535 in it. Use it on: ? Skin that is showing. ? The top of your boots. ? Your pant legs. ? Your sleeve cuffs.  For repellent products that contain permethrin, follow product instructions. Use these products on: ? Clothing. ? Gear. ? Boots. ? Tents.  Wear protective clothing. Long sleeves and long pants offer the best protection from ticks.  Wear light-colored clothing so you can see ticks more easily.  Tuck your pant legs into your socks.  If you go walking on a trail, stay in the middle of the trail so your skin, hair, and clothing do not touch the bushes.  Avoid walking through areas with long grass.  Check for ticks on your clothing, hair, and skin often while you are outside, and check again  before you go inside. Make sure to check the places that ticks attach themselves most often. These places include the scalp, neck, armpits, waist, groin, and joint areas. Ticks that carry a disease called Lyme disease have to be attached to the skin for 24-48 hours. Checking for ticks every day will lessen your risk of this and other diseases.  When you come indoors, wash your clothes and take a shower or a bath right away. Dry your clothes in a dryer on high heat for at least 60 minutes. This will kill any ticks in your clothes. What is the proper way to remove a tick? If you find a tick on your body, remove it as soon as possible. Removing a tick sooner rather than later can prevent germs from passing from the tick to your body. To remove a tick that is crawling on your skin but has not bitten:  Go outdoors and brush the tick off.  Remove the tick with tape or a lint roller. To remove a tick that is attached to your skin:  Wash your hands.  If you have latex gloves, put them on.  Use tweezers, curved forceps, or a tick-removal tool to gently grasp the tick as close to your skin and the tick's head as possible.  Gently pull with steady, upward pressure until the tick lets go. When removing the tick: ?  Take care to keep the tick's head attached to its body. ? Do not twist or jerk the tick. This can make the tick's head or mouth break off. ? Do not squeeze or crush the tick's body. This could force disease-carrying fluids from the tick into your body. Do not try to remove a tick with heat, alcohol, petroleum jelly, or fingernail polish. Using these methods can cause the tick to salivate and regurgitate into your bloodstream, increasing your risk of getting a disease. What should I do after removing a tick?  Clean the bite area with soap and water, rubbing alcohol, or an iodine scrub.  If an antiseptic cream or ointment is available, apply a small amount to the bite site.  Wash and  disinfect any instruments that you used to remove the tick. How should I dispose of a tick? To dispose of a live tick, use one of these methods:  Place it in rubbing alcohol.  Place it in a sealed bag or container.  Wrap it tightly in tape.  Flush it down the toilet. Contact a health care provider if:  You have symptoms of a disease after a tick bite. Symptoms of a tick-borne disease can occur from moments after the tick bites to up to 30 days after a tick is removed. Symptoms include: ? Muscle, joint, or bone pain. ? Difficulty walking or moving your legs. ? Numbness in the legs. ? Paralysis. ? Red rash around the tick bite area that is shaped like a target or a "bull's-eye." ? Redness and swelling in the area of the tick bite. ? Fever. ? Repeated vomiting. ? Diarrhea. ? Weight loss. ? Tender, swollen lymph glands. ? Shortness of breath. ? Cough. ? Pain in the abdomen. ? Headache. ? Abnormal tiredness. ? A change in your level of consciousness. ? Confusion. Get help right away if:  You are not able to remove a tick.  A part of a tick breaks off and gets stuck in your skin.  Your symptoms get worse. Summary  Ticks may carry germs that can spread to a person through a bite and cause disease.  Wear protective clothing and use insect repellent to prevent tick bites. Follow product instructions.  If you find a tick on your body, remove it as soon as possible. If the tick is attached, do not try to remove with heat, alcohol, petroleum jelly, or fingernail polish.  Remove the attached tick using tweezers, curved forceps, or a tick-removal tool. Gently pull with steady, upward pressure until the tick lets go. Do not twist or jerk the tick. Do not squeeze or crush the tick's body.  If you have symptoms after being bitten by a tick, contact a health care provider. This information is not intended to replace advice given to you by your health care provider. Make sure you  discuss any questions you have with your health care provider. Document Revised: 09/15/2017 Document Reviewed: 07/15/2016 Elsevier Patient Education  2020 [employer].

## 2020-02-21 ENCOUNTER — Encounter: Payer: Self-pay | Admitting: Family

## 2020-02-21 ENCOUNTER — Other Ambulatory Visit: Payer: Self-pay

## 2020-02-21 ENCOUNTER — Ambulatory Visit: Payer: Medicare PPO | Admitting: Family

## 2020-02-21 ENCOUNTER — Ambulatory Visit (INDEPENDENT_AMBULATORY_CARE_PROVIDER_SITE_OTHER): Payer: Medicare PPO

## 2020-02-21 VITALS — BP 130/76 | HR 76 | Temp 96.4°F | Ht 62.0 in | Wt 173.4 lb

## 2020-02-21 DIAGNOSIS — R0602 Shortness of breath: Secondary | ICD-10-CM

## 2020-02-21 DIAGNOSIS — M5136 Other intervertebral disc degeneration, lumbar region: Secondary | ICD-10-CM

## 2020-02-21 DIAGNOSIS — E785 Hyperlipidemia, unspecified: Secondary | ICD-10-CM

## 2020-02-21 DIAGNOSIS — I6523 Occlusion and stenosis of bilateral carotid arteries: Secondary | ICD-10-CM

## 2020-02-21 DIAGNOSIS — F325 Major depressive disorder, single episode, in full remission: Secondary | ICD-10-CM

## 2020-02-21 MED ORDER — FLUVOXAMINE MALEATE 100 MG PO TABS: ORAL_TABLET | ORAL | 0 refills | Status: DC

## 2020-02-21 MED ORDER — ATORVASTATIN CALCIUM 10 MG PO TABS: 10.0000 mg | ORAL_TABLET | Freq: Every day | ORAL | 0 refills | Status: DC

## 2020-02-21 MED ORDER — GABAPENTIN 300 MG PO CAPS: 300.0000 mg | ORAL_CAPSULE | Freq: Every day | ORAL | 3 refills | Status: DC

## 2020-02-21 NOTE — Assessment & Plan Note (Addendum)
Patient well-appearing, no respiratory distress.  Walking SaO2 down the hall Sa02 stayed above 95%, heart rate 78-85 bpm.  While walking, she was not labored in her speech at that time.  She denied any shortness of breath after walking with me. H/o PE.   differentials include deconditioning, carotid stenosis. Less concern for CHF as patient had normal EF 2020. Wells criteria low for DVT and PE.  Pending carotid ultrasound, chest x-ray, PT for deconditioning.

## 2020-02-21 NOTE — Patient Instructions (Signed)
Referral to PT, Ultrasound of carotids. Let us know if you dont hear back within a week in regards to an appointment being scheduled.

## 2020-02-21 NOTE — Progress Notes (Signed)
Subjective:    Patient ID: Claudia Andersen, female    DOB: [birth year], 79 y.o.   MRN: [id number]  CC: Claudia Andersen is a 79 y.o. female who presents today for follow up.   HPI: Complains of SOB with exertion, slow onset x 3 months, comes and goes.  NO sob today.  No fever, cough, congestion, sinus pain.  With a hiking pole can can walk around the block and thinks perhaps posture with hiking pole is helpful.    Describes bilateral leg weakness in addition to difficulty getting out of chair now.  Occasionally will feel dizzy.  No associated chest pain, diaphoresis, nausea, left arm pain or syncopal episodes  Used to walk 1.5 miles every day until had a fall in 06/2018 which resulted in pelvic fractures.  Has some low back pain and used flexeril, naprosyn for pain.  No numbness in legs, trouble urinating or having a bowel movement.  No saddle anesthesia or groin pain  S/p hip replacement right- Dr Harlow Mares  No recent surgery or immunization.  No history of DVT.  No malignancy H/O PE.   MR pelvis 2020-showed nondisplaced fracture of the left sacral alla, right anterior posterior acetabulum Osteoporosis- on prolia.  Echo EF normal 10/2018 Stress test low risk Labs 02/19/20 [facility] [place] BL extremities 02/2019 negative DVT   NO h/o asthma. Former smoker  HISTORY:  Past Medical History:  Diagnosis Date  . Ankle fracture, bimalleolar, closed, right, initial encounter 10/26/2018   d/t fall 10/26/18  . Arthritis   . Back abrasion    CRUSHED VERTEBRAE  . Closed right radial fracture 10/26/2018   s/p a fall  . Compression of lumbar vertebra ([place])    L1 and L4 (from fall November 05, 2018)  . Depression   . Gallstones   . GERD (gastroesophageal reflux disease)   . Migraines   . Pulmonary emboli (HCC)    history of emboli   Past Surgical History:  Procedure Laterality Date  . ABDOMINAL HYSTERECTOMY    . CATARACT EXTRACTION W/PHACO Right 03/27/2018   Procedure: CATARACT EXTRACTION PHACO  AND INTRAOCULAR LENS PLACEMENT (IOC);  Surgeon: Birder Robson, MD;  Location: [facility];  Service: Ophthalmology;  Laterality: Right;  [place] 00:33 AP% 15.3 CDE 5.18 Fluid pack lot # [id number]  . CATARACT EXTRACTION W/PHACO Left 06/26/2018   Procedure: CATARACT EXTRACTION PHACO AND INTRAOCULAR LENS PLACEMENT ([place]);  Surgeon: Birder Robson, MD;  Location: [facility];  Service: Ophthalmology;  Laterality: Left;  [place] 00:38 AP% 15.2 CDE 5.80 Fluid pack lot # [id number]  . FRACTURE SURGERY     RIGHT RADIAL  . JOINT REPLACEMENT     LEFT TKR  . 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 TRIPLE SUBTALAR FUSION     Family History  Problem Relation Age of Onset  . Arthritis Mother   . Heart disease Mother   . Stroke Mother   . Arthritis Father   . Heart disease Father   . Stroke Father   . Breast cancer Neg Hx   . Colon cancer Neg Hx     Allergies: Patient has no known allergies. Current Outpatient Medications on File Prior to Visit  Medication Sig Dispense Refill  . acetaminophen (TYLENOL) 500 MG tablet Take 500 mg by mouth 2 (two) times daily as needed (PAIN.).    Marland Kitchen aspirin 81  MG chewable tablet Chew 1 tablet (81 mg total) by mouth 2 (two) times daily. 60 tablet 0  . Biotin 2500 MCG CAPS Take 2,500 mcg by mouth daily.    . Calcium Carb-Cholecalciferol (CALCIUM 600 + D PO) Take 2 tablets by mouth daily.     . cyclobenzaprine (FLEXERIL) 10 MG tablet Take 10 mg by mouth 3 (three) times daily as needed for muscle spasms.    . Fluticasone Furoate (FLONASE SENSIMIST NA) Place 1 spray into both nostrils daily as needed (allergies).    . furosemide (LASIX) 20 MG tablet Take 1 tablet (20 mg total) by mouth daily. 30 tablet 3  . hydrochlorothiazide (MICROZIDE) 12.5 MG capsule     . mupirocin ointment (BACTROBAN) 2 % Apply 1 application topically 2  (two) times daily. 22 g 0  . naproxen (NAPROSYN) 375 MG tablet naproxen 375 mg tablet    . pantoprazole (PROTONIX) 40 MG tablet TAKE ONE TABLET BY MOUTH TWICE A DAY 180 tablet 2  . potassium chloride (KLOR-CON) 10 MEQ tablet TAKE ONE TABLET BY MOUTH DAILY WHILE TAKING HCTZ (DIURETIC) 30 tablet 0  . traZODone (DESYREL) 50 MG tablet Take 50-100 mg by mouth at bedtime as needed for sleep.      No current facility-administered medications on file prior to visit.    Social History   Tobacco Use  . Smoking status: Former Research scientist (life sciences)  . Smokeless tobacco: Never Used  Substance Use Topics  . Alcohol use: Yes    Comment: 5-7 glasses per week  . Drug use: Not on file    Review of Systems  Constitutional: Negative for chills and fever.  HENT: Negative for sinus pressure.   Respiratory: Positive for shortness of breath and wheezing (occasional). Negative for cough.   Cardiovascular: Positive for leg swelling. Negative for chest pain and palpitations.  Gastrointestinal: Negative for nausea and vomiting.  Musculoskeletal: Negative for arthralgias.  Neurological: Positive for weakness. Negative for numbness.  Psychiatric/Behavioral: Negative for sleep disturbance and suicidal ideas. The patient is not nervous/anxious.       Objective:    BP 130/76   Pulse 76   Temp (!) 96.4 F (35.8 C) (Temporal)   Ht 5\' 2"  (1.575 m)   Wt 173 lb 6.4 oz (78.7 kg)   SpO2 97%   BMI 31.72 kg/m  BP Readings from Last 3 Encounters:  02/21/20 130/76  02/07/20 136/72  05/02/19 132/75   Wt Readings from Last 3 Encounters:  02/21/20 173 lb 6.4 oz (78.7 kg)  02/07/20 168 lb 12.8 oz (76.6 kg)  05/02/19 165 lb 9.6 oz (75.1 kg)    Physical Exam Vitals reviewed.  Constitutional:      Appearance: She is well-developed.  Eyes:     Conjunctiva/sclera: Conjunctivae normal.  Cardiovascular:     Rate and Rhythm: Normal rate and regular rhythm.     Pulses: Normal pulses.     Heart sounds: Normal heart sounds.      Comments: No carotid artery bruits bilaterally.  Non pitting BLE edema No  palpable cords or masses. No erythema or increased warmth. No asymmetry in calf size when compared bilaterally LE hair growth symmetric and present. No discoloration of varicosities noted. LE warm and palpable pedal pulses.  Pulmonary:     Effort: Pulmonary effort is normal.     Breath sounds: Normal breath sounds. No wheezing, rhonchi or rales.  Musculoskeletal:     Right lower leg: 1+ Edema present.     Left lower leg:  1+ Edema present.  Skin:    General: Skin is warm and dry.  Neurological:     Mental Status: She is alert.  Psychiatric:        Speech: Speech normal.        Behavior: Behavior normal.        Thought Content: Thought content normal.        Assessment & Plan:   Problem List Items Addressed This Visit      Cardiovascular and Mediastinum   Carotid artery stenosis    Pending ultrasound carotid arteries.  Discussed referral to vascular if stenosis greater than 50%        Other   SOB (shortness of breath) on exertion - Primary    Patient well-appearing, no respiratory distress.  Walking SaO2 down the hall Sa02 stayed above 95%, heart rate 78-85 bpm.  While walking, she was not labored in her speech at that time.  She denied any shortness of breath after walking with me. H/o PE.   differentials include deconditioning, carotid stenosis. Less concern for CHF as patient had normal EF 2020. Wells criteria low for DVT and PE.  Pending carotid ultrasound, chest x-ray, PT for deconditioning.       Relevant Orders   Ambulatory referral to Physical Therapy   US Carotid Bilateral   DG Chest 2 View   Lipid panel   TSH    Other Visit Diagnoses    Hyperlipidemia, unspecified hyperlipidemia type       Relevant Orders   US Carotid Bilateral   Lipid panel       I am having [name] "[place]" maintain her traZODone, Calcium Carb-Cholecalciferol (CALCIUM 600 + D PO), Fluticasone Furoate  (FLONASE SENSIMIST NA), acetaminophen, Biotin, aspirin, furosemide, mupirocin ointment, hydrochlorothiazide, naproxen, cyclobenzaprine, potassium chloride, and pantoprazole.   No orders of the defined types were placed in this encounter.   Return precautions given.   Risks, benefits, and alternatives of the medications and treatment plan prescribed today were discussed, and patient expressed understanding.   Education regarding symptom management and diagnosis given to patient on AVS.  Continue to follow with Burnard Hawthorne, FNP for routine health maintenance.   [name] and I agreed with plan.   Mable Paris, FNP

## 2020-02-21 NOTE — Assessment & Plan Note (Signed)
Pending ultrasound carotid arteries.  Discussed referral to vascular if stenosis greater than 50%

## 2020-02-24 ENCOUNTER — Other Ambulatory Visit: Payer: Self-pay

## 2020-02-24 ENCOUNTER — Other Ambulatory Visit (INDEPENDENT_AMBULATORY_CARE_PROVIDER_SITE_OTHER): Payer: Medicare PPO

## 2020-02-24 DIAGNOSIS — R0602 Shortness of breath: Secondary | ICD-10-CM | POA: Diagnosis not present

## 2020-02-24 DIAGNOSIS — E785 Hyperlipidemia, unspecified: Secondary | ICD-10-CM

## 2020-02-24 LAB — LIPID PANEL
Cholesterol: 173 mg/dL (ref 0–200)
HDL: 95.5 mg/dL (ref >39.00)
LDL Cholesterol: 67 mg/dL (ref 0–99)
NonHDL: 77.24
Total CHOL/HDL Ratio: 2
Triglycerides: 51 mg/dL (ref 0.0–149.0)
VLDL: 10.2 mg/dL (ref 0.0–40.0)

## 2020-02-24 LAB — TSH: TSH: 1.78 u[IU]/mL (ref 0.35–4.50)

## 2020-02-27 ENCOUNTER — Other Ambulatory Visit: Payer: Self-pay | Admitting: Family

## 2020-02-27 DIAGNOSIS — R0602 Shortness of breath: Secondary | ICD-10-CM

## 2020-03-02 ENCOUNTER — Other Ambulatory Visit: Payer: Self-pay | Admitting: Family

## 2020-03-02 DIAGNOSIS — Z1231 Encounter for screening mammogram for malignant neoplasm of breast: Secondary | ICD-10-CM

## 2020-03-03 ENCOUNTER — Telehealth: Payer: Self-pay | Admitting: Family

## 2020-03-03 NOTE — Telephone Encounter (Signed)
I left pt a vm to call ofc to sch [place] Caroid bilateral.

## 2020-03-04 ENCOUNTER — Telehealth: Payer: Self-pay | Admitting: Family

## 2020-03-04 NOTE — Telephone Encounter (Signed)
Ok. Thanks!

## 2020-03-04 NOTE — Telephone Encounter (Signed)
I left pt a vm to call ofc to sch [facility]. 

## 2020-03-04 NOTE — Telephone Encounter (Signed)
Pt called back returning your call °

## 2020-03-10 ENCOUNTER — Ambulatory Visit
Admission: RE | Admit: 2020-03-10 | Discharge: 2020-03-10 | Disposition: A | Discharge status: Home / Self Care | Payer: Medicare PPO | Source: Ambulatory Visit | Attending: Family | Admitting: Family

## 2020-03-10 DIAGNOSIS — M25579 Pain in unspecified ankle and joints of unspecified foot: Secondary | ICD-10-CM | POA: Insufficient documentation

## 2020-03-10 DIAGNOSIS — Z1231 Encounter for screening mammogram for malignant neoplasm of breast: Secondary | ICD-10-CM | POA: Diagnosis present

## 2020-03-18 ENCOUNTER — Ambulatory Visit
Admission: RE | Admit: 2020-03-18 | Discharge: 2020-03-18 | Disposition: A | Discharge status: Home / Self Care | Payer: Medicare PPO | Source: Ambulatory Visit | Attending: Family | Admitting: Family

## 2020-03-18 ENCOUNTER — Other Ambulatory Visit: Payer: Self-pay

## 2020-03-18 DIAGNOSIS — R0602 Shortness of breath: Secondary | ICD-10-CM | POA: Insufficient documentation

## 2020-03-18 DIAGNOSIS — E785 Hyperlipidemia, unspecified: Secondary | ICD-10-CM | POA: Insufficient documentation

## 2020-04-03 ENCOUNTER — Encounter: Payer: Self-pay | Admitting: Family

## 2020-04-03 ENCOUNTER — Ambulatory Visit: Payer: Medicare PPO | Admitting: Family

## 2020-04-03 ENCOUNTER — Other Ambulatory Visit: Payer: Self-pay

## 2020-04-03 VITALS — BP 134/78 | HR 77 | Temp 96.0°F | Resp 16 | Wt 172.0 lb

## 2020-04-03 DIAGNOSIS — M7061 Trochanteric bursitis, right hip: Secondary | ICD-10-CM | POA: Diagnosis not present

## 2020-04-03 DIAGNOSIS — R300 Dysuria: Secondary | ICD-10-CM | POA: Insufficient documentation

## 2020-04-03 DIAGNOSIS — M7060 Trochanteric bursitis, unspecified hip: Secondary | ICD-10-CM | POA: Insufficient documentation

## 2020-04-03 DIAGNOSIS — B001 Herpesviral vesicular dermatitis: Secondary | ICD-10-CM | POA: Diagnosis not present

## 2020-04-03 LAB — URINALYSIS, ROUTINE W REFLEX MICROSCOPIC
Bilirubin Urine: NEGATIVE
Hgb urine dipstick: NEGATIVE
Leukocytes,Ua: NEGATIVE
Nitrite: NEGATIVE
RBC / HPF: NONE SEEN (ref 0–?)
Specific Gravity, Urine: 1.015 (ref 1.000–1.030)
Total Protein, Urine: NEGATIVE
Urine Glucose: NEGATIVE
Urobilinogen, UA: 0.2 (ref 0.0–1.0)
pH: 7 (ref 5.0–8.0)

## 2020-04-03 MED ORDER — VALACYCLOVIR HCL 1 G PO TABS: ORAL_TABLET | ORAL | 2 refills | Status: DC

## 2020-04-03 MED ORDER — PREDNISONE 10 MG PO TABS: ORAL_TABLET | ORAL | 0 refills | Status: DC

## 2020-04-03 MED ORDER — TRIAMCINOLONE ACETONIDE 0.025 % EX CREA
1.0000 | TOPICAL_CREAM | Freq: Two times a day (BID) | CUTANEOUS | 1 refills | Status: DC
Start: 2020-04-03 — End: 2020-06-17

## 2020-04-03 NOTE — Assessment & Plan Note (Addendum)
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 Will start valtrex.have given topical corticosteroid as well.  Of note , rash under nose is nonspecific. Trial of bactroban ( she has at home) with topical corticosteroid, she will let me know if no resolve.

## 2020-04-03 NOTE — Assessment & Plan Note (Addendum)
Presentation consistent with trochanteric bursitis.  Suspect limb length discrepancy since surgery is likely contributory.  Advised patient to put elevation in her shoes.  We will try a short course of prednisone; patient will stay vigilant in regards to exacerbation of leg swelling.  If no improvement, advised her to return to orthopedics for further evaluation

## 2020-04-03 NOTE — Patient Instructions (Addendum)
We discussed several things of his visit today. I think you are suffering from trochanteric bursitis, inflammation below.  You may start short burst prednisone taper.  Please watch your lower extremity swelling as this could exacerbate.  Please however start Lasix as we discussed today the next 2 to 3 days and monitor weight.  I suspect you have a cold sore, for this I given you Valtrex. For under the nose, you may use Bactroban, triamcinolone (steroid) cream together see if helpful.  Please let me know if not.  Close follow-up and let me know how you are doing    Hip Bursitis  Hip bursitis is swelling of a fluid-filled sac (bursa) in your hip joint. This swelling (inflammation) can be painful. This condition may come and go over time. What are the causes?  Injury to the hip.  Overuse of the muscles that surround the hip joint.  An earlier injury or surgery of the hip.  Arthritis or gout.  Diabetes.  Thyroid disease.  Infection.  In some cases, the cause may not be known. What are the signs or symptoms?  Mild or moderate pain in the hip area. Pain may get worse with movement.  Tenderness and swelling of the hip, especially on the outer side of the hip.  In rare cases, the bursa may become infected. This may cause: ? A fever. ? Warmth and redness in the area. Symptoms may come and go. How is this treated? This condition is treated by resting, icing, applying pressure (compression), and raising (elevating) the injured area. You may hear this called the RICE treatment. Treatment may also include:  Using crutches.  Draining fluid out of the bursa to help relieve swelling.  Giving a shot of (injecting) medicine that helps to reduce swelling (cortisone).  Other medicines if the bursa is infected. Follow these instructions at home: Managing pain, stiffness, and swelling   If told, put ice on the painful area. ? Put ice in a plastic bag. ? Place a towel between your skin  and the bag. ? Leave the ice on for 20 minutes, 2-3 times a day. ? Raise (elevate) your hip above the level of your heart as much as you can without pain. To do this, try putting a pillow under your hips while you lie down. Stop if this causes pain. Activity  Return to your normal activities as told by your doctor. Ask your doctor what activities are safe for you.  Rest and protect your hip as much as you can until you feel better. General instructions  Take over-the-counter and prescription medicines only as told by your doctor.  Wear wraps that put pressure on your hip (compression wraps) only as told by your doctor.  Do not use your hip to support your body weight until your doctor says that you can.  Use crutches as told by your doctor.  Gently rub and stretch your injured area as often as is comfortable.  Keep all follow-up visits as told by your doctor. This is important. How is this prevented?  Exercise regularly, as told by your doctor.  Warm up and stretch before being active.  Cool down and stretch after being active.  Avoid activities that bother your hip or cause pain.  Avoid sitting down for long periods at a time. Contact a doctor if:  You have a fever.  You get new symptoms.  You have trouble walking.  You have trouble doing everyday activities.  You have pain that gets  worse.  You have pain that does not get better with medicine.  You get red skin on your hip area.  You get a feeling of warmth in your hip area. Get help right away if:  You cannot move your hip.  You have very bad pain. Summary  Hip bursitis is swelling of a fluid-filled sac (bursa) in your hip.  Hip bursitis can be painful.  Symptoms often come and go over time.  This condition is treated with rest, ice, compression, elevation, and medicines. This information is not intended to replace advice given to you by your health care provider. Make sure you discuss any questions  you have with your health care provider. Document Revised: 06/11/2018 Document Reviewed: 06/11/2018 Elsevier Patient Education  [place].

## 2020-04-03 NOTE — Progress Notes (Signed)
Subjective:    Patient ID: Claudia Andersen, female    DOB: 13-Aug-1941, 79 y.o.   MRN: [id number]  CC: Claudia Andersen is a 79 y.o. female who presents today for an acute visit.    HPI: Multiple complaints  Primary complaint of right sided hip and leg pain x 3 weeks, sudden onset and has been constant since.  No injury, falls. Cannot lie on right side since pain started.  Unable walk due to pain.  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  Taking gabapentin and naprosyn without relief. Has had PO steroids in the past for knee painand tolerated well  Tingling and pain which radiates laterally to right ankle.   Notes urinary hesistancy, occasional dysuria. No recurrent UTIs. No hematuria. Had concerned that she may have UTI.   No trouble having a bowel movement.  No h/o cancer.  H/o right total hip replacement Dr Harlow Mares; completed PT in 2020  Leg swelling worse the past couple of days. Improves in the morning and worse in the evening. Improves with elevation. No fever, increased erythema, increased warmth. Hasnt taken lasix all week as donating platelets in 3 days.   SOB is unchanged. ECHO is next week. No orthoopnea.   Complains of rash under nose 7-10 ago, unchanged.  No congestion. No h/o eczema   lesion to left side of lip which is cracked and bleeding, tried lamisil, cortizone without relief. H/o cold sore.  No h/o skin cancer. Goes annually to [employer].     Dr Ubaldo Glassing 03/10/20 SOB on exertion- pending echo H/o compression lumbar fracture  HISTORY:  Past Medical History:  Diagnosis Date  . Ankle fracture, bimalleolar, closed, right, initial encounter 10/26/2018   d/t fall 10/26/18  . Arthritis   . Back abrasion    CRUSHED VERTEBRAE  . Closed right radial fracture 10/26/2018   s/p a fall  . Compression of lumbar vertebra ([place])    L1 and L4 (from fall November 05, 2018)  . Depression   . Gallstones   . GERD (gastroesophageal reflux  disease)   . Migraines   . Pulmonary emboli (HCC)    history of emboli   Past Surgical History:  Procedure Laterality Date  . ABDOMINAL HYSTERECTOMY    . CATARACT EXTRACTION W/PHACO Right 03/27/2018   Procedure: CATARACT EXTRACTION PHACO AND INTRAOCULAR LENS PLACEMENT (IOC);  Surgeon: Birder Robson, MD;  Location: [facility] ORS;  Service: Ophthalmology;  Laterality: Right;  [place] 00:33 AP% 15.3 CDE 5.18 Fluid pack lot # [id number]  . CATARACT EXTRACTION W/PHACO Left 06/26/2018   Procedure: CATARACT EXTRACTION PHACO AND INTRAOCULAR LENS PLACEMENT ([place]);  Surgeon: Birder Robson, MD;  Location: [facility] ORS;  Service: Ophthalmology;  Laterality: Left;  [place] 00:38 AP% 15.2 CDE 5.80 Fluid pack lot # [id number]  . FRACTURE SURGERY     RIGHT RADIAL  . JOINT REPLACEMENT     LEFT TKR  . 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 TRIPLE SUBTALAR FUSION     Family History  Problem Relation Age of Onset  . Arthritis Mother   . Heart disease Mother   . Stroke Mother   . Arthritis Father   . Heart disease Father   . Stroke Father   . Breast cancer Neg Hx   . Colon cancer Neg Hx  Allergies: Patient has no known allergies. Current Outpatient Medications on File Prior to Visit  Medication Sig Dispense Refill  . acetaminophen (TYLENOL) 500 MG tablet Take 500 mg by mouth 2 (two) times daily as needed (PAIN.).    Marland Kitchen atorvastatin (LIPITOR) 10 MG tablet Take 1 tablet (10 mg total) by mouth daily. 90 tablet 0  . Biotin 2500 MCG CAPS Take 2,500 mcg by mouth daily.    . Calcium Carb-Cholecalciferol (CALCIUM 600 + D PO) Take 2 tablets by mouth daily.     . Fluticasone Furoate (FLONASE SENSIMIST NA) Place 1 spray into both nostrils daily as needed (allergies).    . fluvoxaMINE (LUVOX) 100 MG tablet TAKE 1/2-1 TABLET BY MOUTH DAILY 90 tablet 0  . furosemide  (LASIX) 20 MG tablet Take 1 tablet (20 mg total) by mouth daily. 30 tablet 3  . gabapentin (NEURONTIN) 300 MG capsule Take 1 capsule (300 mg total) by mouth at bedtime. 90 capsule 3  . mupirocin ointment (BACTROBAN) 2 % Apply 1 application topically 2 (two) times daily. 22 g 0  . naproxen (NAPROSYN) 375 MG tablet naproxen 375 mg tablet    . pantoprazole (PROTONIX) 40 MG tablet TAKE ONE TABLET BY MOUTH TWICE A DAY 180 tablet 2  . potassium chloride (KLOR-CON) 10 MEQ tablet TAKE ONE TABLET BY MOUTH DAILY WHILE TAKING HCTZ (DIURETIC) 30 tablet 0  . traZODone (DESYREL) 50 MG tablet Take 50-100 mg by mouth at bedtime as needed for sleep.      No current facility-administered medications on file prior to visit.    Social History   Tobacco Use  . Smoking status: Former Research scientist (life sciences)  . Smokeless tobacco: Never Used  Vaping Use  . Vaping Use: Never used  Substance Use Topics  . Alcohol use: Yes    Comment: 5-7 glasses per week  . Drug use: Not on file    Review of Systems  Constitutional: Negative for chills and fever.  HENT: Positive for mouth sores. Negative for congestion, postnasal drip and sinus pain.   Respiratory: Positive for shortness of breath. Negative for cough.   Cardiovascular: Positive for leg swelling. Negative for chest pain and palpitations.  Gastrointestinal: Negative for nausea and vomiting.  Genitourinary: Positive for difficulty urinating and dysuria. Negative for hematuria.  Musculoskeletal: Positive for arthralgias.  Skin: Positive for rash.  Neurological: Negative for headaches.      Objective:    BP 134/78   Pulse 77   Temp (!) 96 F (35.6 C)   Resp 16   Wt 172 lb (78 kg)   SpO2 98%   BMI 31.46 kg/m  Wt Readings from Last 3 Encounters:  04/03/20 172 lb (78 kg)  02/21/20 173 lb 6.4 oz (78.7 kg)  02/07/20 168 lb 12.8 oz (76.6 kg)     Physical Exam Vitals reviewed.  Constitutional:      Appearance: She is well-developed.  HENT:     Head:       Comments: Dry appearing, erythematous area noted bilateral hands as marked on diagram.  No discrete lesions, vesicles  Fissure, opening note left side crease of lip. Eyes:     Conjunctiva/sclera: Conjunctivae normal.  Cardiovascular:     Rate and Rhythm: Normal rate and regular rhythm.     Pulses: Normal pulses.     Heart sounds: Normal heart sounds.     Comments: BLE  + 2 pitting edema. No palpable cords or masses. No erythema or increased warmth. No asymmetry in calf size  when compared bilaterally  LE warm and palpable pedal pulses.  Pulmonary:     Effort: Pulmonary effort is normal.     Breath sounds: Normal breath sounds. No wheezing, rhonchi or rales.  Musculoskeletal:     Lumbar back: Normal. No bony tenderness. Normal range of motion.     Right hip: No tenderness. Normal range of motion. Normal strength.     Left hip: Bony tenderness present. Normal range of motion. Normal strength.     Right lower leg: 2+ Edema present.     Left lower leg: 2+ Edema present.     Comments: Right Hip: slight waddling gait. Full ROM with flexion and hip rotation in flexion.    No pain of lateral hip with  (flexion-abduction-external rotation) test.   pain with deep palpation of greater trochanter and over right SI joint. No pain with SLR bilaterally.    Skin:    General: Skin is warm and dry.  Neurological:     Mental Status: She is alert.  Psychiatric:        Speech: Speech normal.        Behavior: Behavior normal.        Thought Content: Thought content normal.        Assessment & Plan:   . Problem List Items Addressed This Visit      Digestive   Herpes labialis    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 Will start valtrex.have given topical corticosteroid as well.  Of note , rash under nose is nonspecific. Trial of bactroban ( she has at home) with topical corticosteroid, she will let me know if no resolve.       Relevant Medications   valACYclovir (VALTREX) 1000 MG  tablet     Musculoskeletal and Integument   Trochanteric bursitis - Primary    Presentation consistent with trochanteric bursitis.  Suspect limb length discrepancy since surgery is likely contributory.  Advised patient to put elevation in her shoes.  We will try a short course of prednisone; patient will stay vigilant in regards to exacerbation of leg swelling.  If no improvement, advised her to return to orthopedics for further evaluation      Relevant Medications   predniSONE (DELTASONE) 10 MG tablet     Other   Dysuria   Relevant Orders   Urinalysis, Routine w reflex microscopic   Urine Culture        I have discontinued [name] "Sandy"'s aspirin, hydrochlorothiazide, and cyclobenzaprine. I am also having her start on predniSONE, triamcinolone, and valACYclovir. Additionally, I am having her maintain her traZODone, Calcium Carb-Cholecalciferol (CALCIUM 600 + D PO), Fluticasone Furoate (FLONASE SENSIMIST NA), acetaminophen, Biotin, furosemide, mupirocin ointment, naproxen, potassium chloride, pantoprazole, gabapentin, fluvoxaMINE, and atorvastatin.   Meds ordered this encounter  Medications  . predniSONE (DELTASONE) 10 MG tablet    Sig: Take 40 mg by mouth on day 1, then taper 10 mg daily until gone    Dispense:  10 tablet    Refill:  0    Order Specific Question:   Supervising Provider    Answer:   Deborra Medina L [[id number]]  . triamcinolone (KENALOG) 0.025 % cream    Sig: Apply 1 application topically 2 (two) times daily.    Dispense:  15 g    Refill:  1    Order Specific Question:   Supervising Provider    Answer:   Derrel Nip, TERESA L [[id number]]  . valACYclovir (VALTREX) 1000 MG tablet  Sig: Take two tablets ( total 2000 mg) by mouth q12h x 1 day; Start: ASAP after symptom onset    Dispense:  6 tablet    Refill:  2    Order Specific Question:   Supervising Provider    Answer:   Crecencio Mc [[id number]]    Return precautions given.   Risks, benefits, and alternatives  of the medications and treatment plan prescribed today were discussed, and patient expressed understanding.   Education regarding symptom management and diagnosis given to patient on AVS.  Continue to follow with Burnard Hawthorne, FNP for routine health maintenance.   [name] and I agreed with plan.   Mable Paris, FNP

## 2020-04-04 LAB — URINE CULTURE
MICRO NUMBER:: 10608051
SPECIMEN QUALITY:: ADEQUATE

## 2020-04-06 ENCOUNTER — Other Ambulatory Visit: Payer: Self-pay

## 2020-04-06 ENCOUNTER — Ambulatory Visit
Admission: EM | Admit: 2020-04-06 | Discharge: 2020-04-06 | Disposition: A | Discharge status: Home / Self Care | Payer: Medicare PPO

## 2020-04-06 ENCOUNTER — Encounter: Payer: Self-pay | Admitting: Emergency Medicine

## 2020-04-06 DIAGNOSIS — M62838 Other muscle spasm: Secondary | ICD-10-CM | POA: Diagnosis not present

## 2020-04-06 NOTE — Discharge Instructions (Addendum)
Take Tylenol as needed for discomfort.  Take the muscle relaxer prescribed by your doctor as needed.  Apply warm compresses.  Try gentle stretching and massage.    Follow up with your primary care provider if your symptoms are not improving.

## 2020-04-06 NOTE — ED Provider Notes (Signed)
Roderic Palau    CSN: [id number] Arrival date & time: 04/06/20  1157      History   Chief Complaint Chief Complaint  Patient presents with   Neck Pain    HPI Claudia Andersen is a 79 y.o. female.   Patient presents with right-sided neck pain since waking up this morning.  She states she thinks she slept wrong and has a "crick" in her neck.  The pain is now causing her right ear to hurt also.  She denies fever, chills, sore throat, cough, shortness of breath, rash, redness, numbness, weakness, paresthesias, or other symptoms.  She states she took 1 Flexeril this morning.  The history is provided by the patient.    Past Medical History:  Diagnosis Date   Ankle fracture, bimalleolar, closed, right, initial encounter 10/26/2018   d/t fall 10/26/18   Arthritis    Back abrasion    CRUSHED VERTEBRAE   Closed right radial fracture 10/26/2018   s/p a fall   Compression of lumbar vertebra (HCC)    L1 and L4 (from fall November 05, 2018)   Depression    Gallstones    GERD (gastroesophageal reflux disease)    Migraines    Pulmonary emboli (HCC)    history of emboli    Patient Active Problem List   Diagnosis Date Noted   Trochanteric bursitis 04/03/2020   Herpes labialis 04/03/2020   Dysuria 04/03/2020   SOB (shortness of breath) on exertion 02/21/2020   Chronic venous insufficiency 02/28/2019   Lymphedema 02/28/2019   Acetabular fracture ([place]) 01/09/2019   Preoperative evaluation to rule out surgical contraindication 11/23/2018   Need for shingles vaccine 11/16/2018   Osteoporosis 10/26/2018   Leg edema 10/26/2018   Carotid artery stenosis 10/26/2018   History of pulmonary embolism 10/26/2018   History of DVT of lower extremity 10/26/2018   Osteoarthritis 10/26/2018   DDD (degenerative disc disease), 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/2018   Screen for colon cancer 07/31/2017   GERD (gastroesophageal reflux disease) 07/31/2017   Depression, major,  single episode, complete remission ([place]) 07/31/2017   Insomnia 07/31/2017    Past Surgical History:  Procedure Laterality Date   ABDOMINAL HYSTERECTOMY     CATARACT EXTRACTION W/PHACO Right 03/27/2018   Procedure: CATARACT EXTRACTION PHACO AND INTRAOCULAR LENS PLACEMENT ([place]);  Surgeon: Birder Robson, MD;  Location: [facility]S;  Service: Ophthalmology;  Laterality: Right;  [place] 00:33 AP% 15.3 CDE 5.18 Fluid pack lot # [id number]   CATARACT EXTRACTION W/PHACO Left 06/26/2018   Procedure: CATARACT EXTRACTION PHACO AND INTRAOCULAR LENS PLACEMENT ([place]);  Surgeon: Birder Robson, MD;  Location: [facility];  Service: Ophthalmology;  Laterality: Left;  [place] 00:38 AP% 15.2 CDE 5.80 Fluid pack lot # [id number]   FRACTURE SURGERY     RIGHT RADIAL   JOINT REPLACEMENT     LEFT TKR   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   Procedure: TOTAL HIP ARTHROPLASTY ANTERIOR APPROACH;  Surgeon: Lovell Sheehan, MD;  Location: [facility] OR[facility];  Service: Orthopedics;  Laterality: Right;   TRIPLE SUBTALAR FUSION      OB History   No obstetric history on file.      Home Medications    Prior to Admission medications   Medication Sig Start Date End Date Taking? Authorizing Provider  acetaminophen (TYLENOL) 500 MG tablet Take 500 mg by mouth 2 (two) times daily as needed (PAIN.).   Yes [provider]  atorvastatin (LIPITOR) 10 MG tablet Take 1 tablet (10 mg total) by mouth daily.  02/21/20  Yes Burnard Hawthorne, FNP  Biotin 2500 MCG CAPS Take 2,500 mcg by mouth daily.   Yes [provider]  Calcium Carb-Cholecalciferol (CALCIUM 600 + D PO) Take 2 tablets by mouth daily.    Yes [provider]  Fluticasone Furoate (FLONASE SENSIMIST NA) Place 1 spray into both nostrils daily as needed (allergies).   Yes [provider]  fluvoxaMINE (LUVOX) 100 MG tablet TAKE 1/2-1 TABLET BY MOUTH DAILY 02/21/20  Yes Arnett, Yvetta Coder, FNP  furosemide (LASIX) 20 MG tablet Take 1 tablet (20 mg  total) by mouth daily. 02/20/19  Yes Burnard Hawthorne, FNP  gabapentin (NEURONTIN) 300 MG capsule Take 1 capsule (300 mg total) by mouth at bedtime. 02/21/20  Yes Arnett, Yvetta Coder, FNP  mupirocin ointment (BACTROBAN) 2 % Apply 1 application topically 2 (two) times daily. 02/27/19  Yes Arnett, Yvetta Coder, FNP  naproxen (NAPROSYN) 375 MG tablet naproxen 375 mg tablet   Yes [provider]  pantoprazole (PROTONIX) 40 MG tablet TAKE ONE TABLET BY MOUTH TWICE A DAY 12/04/19  Yes Arnett, Yvetta Coder, FNP  potassium chloride (KLOR-CON) 10 MEQ tablet TAKE ONE TABLET BY MOUTH DAILY WHILE TAKING HCTZ (DIURETIC) 08/08/19  Yes Burnard Hawthorne, FNP  predniSONE (DELTASONE) 10 MG tablet Take 40 mg by mouth on day 1, then taper 10 mg daily until gone 04/03/20  Yes Arnett, Yvetta Coder, FNP  traZODone (DESYREL) 50 MG tablet Take 50-100 mg by mouth at bedtime as needed for sleep.    Yes [provider]  triamcinolone (KENALOG) 0.025 % cream Apply 1 application topically 2 (two) times daily. 04/03/20  Yes Burnard Hawthorne, FNP  valACYclovir (VALTREX) 1000 MG tablet Take two tablets ( total 2000 mg) by mouth q12h x 1 day; Start: ASAP after symptom onset 04/03/20  Yes Arnett, Yvetta Coder, FNP    Family History Family History  Problem Relation Age of Onset   Arthritis Mother    Heart disease Mother    Stroke Mother    Arthritis Father    Heart disease Father    Stroke Father    Breast cancer Neg Hx    Colon cancer Neg Hx     Social History Social History   Tobacco Use   Smoking status: Former Smoker   Smokeless tobacco: Never Used  Scientific laboratory technician Use: Never used  Substance Use Topics   Alcohol use: Yes    Comment: 5-7 glasses per week   Drug use: Not on file     Allergies   Patient has no known allergies.   Review of Systems Review of Systems  Constitutional: Negative for chills and fever.  HENT: Positive for ear pain. Negative for sore throat and trouble  swallowing.   Eyes: Negative for pain and visual disturbance.  Respiratory: Negative for cough and shortness of breath.   Cardiovascular: Negative for chest pain and palpitations.  Gastrointestinal: Negative for abdominal pain and vomiting.  Genitourinary: Negative for dysuria and hematuria.  Musculoskeletal: Positive for myalgias and neck pain. Negative for arthralgias and back pain.  Skin: Negative for color change and rash.  Neurological: Negative for seizures, syncope, weakness and numbness.  All other systems reviewed and are negative.    Physical Exam Triage Vital Signs ED Triage Vitals  Enc Vitals Group     BP      Pulse      Resp      Temp      Temp src  SpO2      Weight      Height      Head Circumference      Peak Flow      Pain Score      Pain Loc      Pain Edu?      Excl. in [place]?    No data found.  Updated Vital Signs There were no vitals taken for this visit.  Visual Acuity Right Eye Distance:   Left Eye Distance:   Bilateral Distance:    Right Eye Near:   Left Eye Near:    Bilateral Near:     Physical Exam Vitals and nursing note reviewed.  Constitutional:      General: She is not in acute distress.    Appearance: She is well-developed. She is not ill-appearing or toxic-appearing.  HENT:     Head: Normocephalic and atraumatic.     Right Ear: Tympanic membrane and ear canal normal.     Left Ear: Tympanic membrane and ear canal normal.     Nose: Nose normal.     Mouth/Throat:     Mouth: Mucous membranes are moist.     Pharynx: Oropharynx is clear.  Eyes:     Conjunctiva/sclera: Conjunctivae normal.  Cardiovascular:     Rate and Rhythm: Normal rate and regular rhythm.     Heart sounds: No murmur heard.   Pulmonary:     Effort: Pulmonary effort is normal. No respiratory distress.     Breath sounds: Normal breath sounds.  Abdominal:     Palpations: Abdomen is soft.     Tenderness: There is no abdominal tenderness. There is no guarding  or rebound.  Musculoskeletal:        General: Tenderness present. No swelling, deformity or signs of injury.     Cervical back: Neck supple.     Comments: Neck ROM limited by discomfort.  Mild muscle tenderness on right sternocleidomastoid and trapezius.    Skin:    General: Skin is warm and dry.     Findings: No bruising, erythema, lesion or rash.  Neurological:     General: No focal deficit present.     Mental Status: She is alert and oriented to person, place, and time.     Sensory: No sensory deficit.     Motor: No weakness.     Gait: Gait normal.  Psychiatric:        Mood and Affect: Mood normal.        Behavior: Behavior normal.      UC Treatments / Results  Labs (all labs ordered are listed, but only abnormal results are displayed) Labs Reviewed - No data to display  EKG   Radiology No results found.  Procedures Procedures (including critical care time)  Medications Ordered in UC Medications - No data to display  Initial Impression / Assessment and Plan / UC Course  I have reviewed the triage vital signs and the nursing notes.  Pertinent labs & imaging results that were available during my care of the patient were reviewed by me and considered in my medical decision making (see chart for details).   Muscle spasm of neck.  Instructed patient to take Tylenol as needed for discomfort.  Discussed that she can continue to take the muscle relaxer prescribed by her PCP; precautions for drowsiness with this medication discussed with patient.  Instructed her to apply warm compresses, do gentle stretching and massage.  Instructed her to follow-up with her PCP if  her symptoms are not improving.  Patient agrees to plan of care.   Final Clinical Impressions(s) / UC Diagnoses   Final diagnoses:  Muscle spasms of neck     Discharge Instructions     Take Tylenol as needed for discomfort.  Take the muscle relaxer prescribed by your doctor as needed.  Apply warm  compresses.  Try gentle stretching and massage.    Follow up with your primary care provider if your symptoms are not improving.        ED Prescriptions    None     I have reviewed the PDMP during this encounter.   Sharion Balloon, NP 04/06/20 [phone]

## 2020-04-06 NOTE — ED Triage Notes (Signed)
Patient presents to urgent care today with symptoms of neck pain on right side. Symptoms began on this morning upon waking. Patient states she took flexeril this morning at onset of symptoms but it has not helped.

## 2020-04-10 ENCOUNTER — Ambulatory Visit: Payer: Medicare PPO | Admitting: Family

## 2020-04-10 ENCOUNTER — Encounter: Payer: Self-pay | Admitting: Family

## 2020-04-10 ENCOUNTER — Other Ambulatory Visit: Payer: Self-pay

## 2020-04-10 VITALS — BP 126/80 | HR 68 | Temp 97.9°F | Ht 62.0 in | Wt 173.4 lb

## 2020-04-10 DIAGNOSIS — M7061 Trochanteric bursitis, right hip: Secondary | ICD-10-CM

## 2020-04-10 DIAGNOSIS — R600 Localized edema: Secondary | ICD-10-CM

## 2020-04-10 DIAGNOSIS — M542 Cervicalgia: Secondary | ICD-10-CM | POA: Diagnosis not present

## 2020-04-10 MED ORDER — MELOXICAM 7.5 MG PO TABS: 7.5000 mg | ORAL_TABLET | Freq: Every day | ORAL | 1 refills | Status: DC | PRN

## 2020-04-10 MED ORDER — PREDNISONE 10 MG PO TABS: ORAL_TABLET | ORAL | 0 refills | Status: DC

## 2020-04-10 MED ORDER — BACLOFEN 5 MG PO TABS: 5.0000 mg | ORAL_TABLET | Freq: Three times a day (TID) | ORAL | 1 refills | Status: DC | PRN

## 2020-04-10 NOTE — Patient Instructions (Addendum)
Stop naprosyn Trial mobic as more potent anti inflammatory.   Trial of prednisone for neck pain.   Trial baclofen ; do not take with flexeril as baclofen is also a muscle relaxant  Increase lasix to 40mg  for 2-3 days; take potassium chloride daily while taking lasix.   Let me know how neck pain and leg swelling are.   As discussed, if no improvement for the neck pain, please [facility] as discussed .

## 2020-04-10 NOTE — Assessment & Plan Note (Signed)
Resolved at this time, will let me know if recurs

## 2020-04-10 NOTE — Progress Notes (Signed)
Patient presenting with neck pain and stiffness. States this was onset 10 days ago. Woke up with this one morning but it has not gone away. Pain 6/10.   Would also like to follow up on rash underneath her nose.

## 2020-04-10 NOTE — Progress Notes (Signed)
Subjective:    Patient ID: Claudia Andersen, female    DOB: [birth year], 79 y.o.   MRN: [id number]  CC: Claudia Andersen is a 79 y.o. female who presents today for an acute visit.    HPI: Primary complaint of stiffness in neck 10 days, worsened.  Woke up one morning and felt 'crick' in neck. Pain when turning to the right side.  No heavy lifting. No new pillows. No numbness, HA, vision changes, numbness, rash, dizziness, congestion, ear discharge.   Has had shingrex vaccine.   Recently given prednisone for right hip pain with resolution. 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   Rash under nose better as well on triamcinolone  Leg swelling-improved since taking lasix 20mg  qd. Compliant with KCl as well. No Sob.      Seen in urgent care 04/06/2020 for neck pain, advised to use prn flexeril, tylenol HISTORY:  Past Medical History:  Diagnosis Date  . Ankle fracture, bimalleolar, closed, right, initial encounter 10/26/2018   d/t fall 10/26/18  . Arthritis   . Back abrasion    CRUSHED VERTEBRAE  . Closed right radial fracture 10/26/2018   s/p a fall  . Compression of lumbar vertebra ([place])    L1 and L4 (from fall November 05, 2018)  . Depression   . Gallstones   . GERD (gastroesophageal reflux disease)   . Migraines   . Pulmonary emboli ([facility])    history of emboli   Past Surgical History:  Procedure Laterality Date  . ABDOMINAL HYSTERECTOMY    . CATARACT EXTRACTION W/PHACO Right 03/27/2018   Procedure: CATARACT EXTRACTION PHACO AND INTRAOCULAR LENS PLACEMENT ([facility]);  Surgeon: Birder Robson, MD;  Location: [facility] ORS;  Service: Ophthalmology;  Laterality: Right;  [place] 00:33 AP% 15.3 CDE 5.18 Fluid pack lot # [id number]  . CATARACT EXTRACTION W/PHACO Left 06/26/2018   Procedure: CATARACT EXTRACTION PHACO AND INTRAOCULAR LENS PLACEMENT ([place]);  Surgeon: Birder Robson, MD;  Location: [facility] ORS;  Service: Ophthalmology;  Laterality: Left;  [place]  00:38 AP% 15.2 CDE 5.80 Fluid pack lot # [id number]  . FRACTURE SURGERY     RIGHT RADIAL  . JOINT REPLACEMENT     LEFT TKR  . 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 TRIPLE SUBTALAR FUSION     Family History  Problem Relation Age of Onset  . Arthritis Mother   . Heart disease Mother   . Stroke Mother   . Arthritis Father   . Heart disease Father   . Stroke Father   . Breast cancer Neg Hx   . Colon cancer Neg Hx     Allergies: Patient has no known allergies. Current Outpatient Medications on File Prior to Visit  Medication Sig Dispense Refill  . acetaminophen (TYLENOL) 500 MG tablet Take 500 mg by mouth 2 (two) times daily as needed (PAIN.).    Marland Kitchen atorvastatin (LIPITOR) 10 MG tablet Take 1 tablet (10 mg total) by mouth daily. 90 tablet 0  . Biotin 2500 MCG CAPS Take 2,500 mcg by mouth daily.    . Calcium Carb-Cholecalciferol (CALCIUM 600 + D PO) Take 2 tablets by mouth daily.     . cyclobenzaprine (FLEXERIL) 10 MG tablet Take 10 mg by mouth 3 (three) times daily as needed for muscle spasms.    . Fluticasone Furoate (FLONASE SENSIMIST NA) Place  1 spray into both nostrils daily as needed (allergies).    . fluvoxaMINE (LUVOX) 100 MG tablet TAKE 1/2-1 TABLET BY MOUTH DAILY 90 tablet 0  . furosemide (LASIX) 20 MG tablet Take 1 tablet (20 mg total) by mouth daily. 30 tablet 3  . gabapentin (NEURONTIN) 300 MG capsule Take 1 capsule (300 mg total) by mouth at bedtime. 90 capsule 3  . naproxen (NAPROSYN) 375 MG tablet naproxen 375 mg tablet    . pantoprazole (PROTONIX) 40 MG tablet TAKE ONE TABLET BY MOUTH TWICE A DAY 180 tablet 2  . potassium chloride (KLOR-CON) 10 MEQ tablet TAKE ONE TABLET BY MOUTH DAILY WHILE TAKING HCTZ (DIURETIC) 30 tablet 0  . traZODone (DESYREL) 50 MG tablet Take 50-100 mg by mouth at bedtime as needed for sleep.      Marland Kitchen triamcinolone (KENALOG) 0.025 % cream Apply 1 application topically 2 (two) times daily. 15 g 1  . valACYclovir (VALTREX) 1000 MG tablet Take two tablets ( total 2000 mg) by mouth q12h x 1 day; Start: ASAP after symptom onset 6 tablet 2  . mupirocin ointment (BACTROBAN) 2 % Apply 1 application topically 2 (two) times daily. (Patient not taking: Reported on 04/10/2020) 22 g 0   No current facility-administered medications on file prior to visit.    Social History   Tobacco Use  . Smoking status: Former Research scientist (life sciences)  . Smokeless tobacco: Never Used  Vaping Use  . Vaping Use: Never used  Substance Use Topics  . Alcohol use: Yes    Comment: 5-7 glasses per week  . Drug use: Not on file    Review of Systems  Constitutional: Negative for chills and fever.  Eyes: Negative for visual disturbance.  Respiratory: Negative for cough and shortness of breath.   Cardiovascular: Positive for leg swelling. Negative for chest pain and palpitations.  Gastrointestinal: Negative for nausea and vomiting.  Musculoskeletal: Positive for neck pain and neck stiffness.  Skin: Negative for rash.  Neurological: Negative for headaches.      Objective:    BP 126/80   Pulse 68   Temp 97.9 F (36.6 C) (Oral)   Ht 5\' 2"  (1.575 m)   Wt 173 lb 6.4 oz (78.7 kg)   SpO2 97%   BMI 31.72 kg/m  Wt Readings from Last 3 Encounters:  04/10/20 173 lb 6.4 oz (78.7 kg)  04/03/20 172 lb (78 kg)  02/21/20 173 lb 6.4 oz (78.7 kg)     Physical Exam Vitals reviewed.  Constitutional:      Appearance: She is well-developed.  Eyes:     Conjunctiva/sclera: Conjunctivae normal.  Neck:     Comments: No rash.  No shoulder pain.  Cardiovascular:     Rate and Rhythm: Normal rate and regular rhythm.     Pulses: Normal pulses.     Heart sounds: Normal heart sounds.     Comments: BLE edema +1 , non pitting. No palpable cords or masses. No erythema or increased warmth. No asymmetry in calf size when compared  bilaterally LE hair growth symmetric and present. No discoloration of varicosities noted. LE warm and palpable pedal pulses.  Pulmonary:     Effort: Pulmonary effort is normal.     Breath sounds: Normal breath sounds. No wheezing, rhonchi or rales.  Musculoskeletal:     Cervical back: No erythema, rigidity or crepitus. Pain with movement and spinous process tenderness present. Decreased range of motion.     Right lower leg: 1+ Edema present.  Left lower leg: 1+ Edema present.  Skin:    General: Skin is warm and dry.  Neurological:     Mental Status: She is alert.  Psychiatric:        Speech: Speech normal.        Behavior: Behavior normal.        Thought Content: Thought content normal.        Assessment & Plan:   Problem List Items Addressed This Visit      Musculoskeletal and Integument   Trochanteric bursitis    Resolved at this time, will let me know if recurs        Other   Leg edema    Improved.  Advise Lasix 40 mg per 1 to 3 days while remaining on current potassium dosage.  She will let me know if it does not return to baseline      Neck pain - Primary    Working diagnosis of muscle spasm, likely exacerbated by underlying spondylosis or degenerative disc disease.  Patient is well-appearing, nontoxic in appearance.  No headache, fever, history of trauma or radicular symptoms present today.  patient declines any x-ray today which I think is reasonable as unlikely to change our treatment plan  today.  Will trial meloxicam, baclofen, and short prednisone taper.  She understands to stop naproxen and Flexeril.  She will let me know how she is doing ; we already discussed that she will call [facility] if no resolution as she is established there.       Relevant Medications   meloxicam (MOBIC) 7.5 MG tablet   baclofen 5 MG TABS   predniSONE (DELTASONE) 10 MG tablet        I have discontinued [name] "Sandy"'s predniSONE. I am also having her start  on meloxicam, Baclofen, and predniSONE. Additionally, I am having her maintain her traZODone, Calcium Carb-Cholecalciferol (CALCIUM 600 + D PO), Fluticasone Furoate (FLONASE SENSIMIST NA), acetaminophen, Biotin, furosemide, mupirocin ointment, naproxen, potassium chloride, pantoprazole, gabapentin, fluvoxaMINE, atorvastatin, triamcinolone, valACYclovir, and cyclobenzaprine.   Meds ordered this encounter  Medications  . meloxicam (MOBIC) 7.5 MG tablet    Sig: Take 1 tablet (7.5 mg total) by mouth daily as needed for pain.    Dispense:  30 tablet    Refill:  1    Order Specific Question:   Supervising Provider    Answer:   Deborra Medina L [[id number]]  . baclofen 5 MG TABS    Sig: Take 5 mg by mouth 3 (three) times daily as needed for muscle spasms.    Dispense:  30 tablet    Refill:  1    Order Specific Question:   Supervising Provider    Answer:   Deborra Medina L [[id number]]  . predniSONE (DELTASONE) 10 MG tablet    Sig: Take 40 mg by mouth on day 1, then taper 10 mg daily until gone    Dispense:  10 tablet    Refill:  0    Order Specific Question:   Supervising Provider    Answer:   Crecencio Mc [[id number]]    Return precautions given.   Risks, benefits, and alternatives of the medications and treatment plan prescribed today were discussed, and patient expressed understanding.   Education regarding symptom management and diagnosis given to patient on AVS.  Continue to follow with Burnard Hawthorne, FNP for routine health maintenance.   [name] and I agreed with plan.   Mable Paris, FNP

## 2020-04-10 NOTE — Assessment & Plan Note (Signed)
Improved.  Advise Lasix 40 mg per 1 to 3 days while remaining on current potassium dosage.  She will let me know if it does not return to baseline

## 2020-04-10 NOTE — Assessment & Plan Note (Signed)
Working diagnosis of muscle spasm, likely exacerbated by underlying spondylosis or degenerative disc disease.  Patient is well-appearing, nontoxic in appearance.  No headache, fever, history of trauma or radicular symptoms present today.  patient declines any x-ray today which I think is reasonable as unlikely to change our treatment plan  today.  Will trial meloxicam, baclofen, and short prednisone taper.  She understands to stop naproxen and Flexeril.  She will let me know how she is doing ; we already discussed that she will call [facility] if no resolution as she is established there.

## 2020-05-08 ENCOUNTER — Other Ambulatory Visit
Admission: RE | Admit: 2020-05-08 | Discharge: 2020-05-08 | Disposition: A | Discharge status: Home / Self Care | Payer: Medicare PPO | Source: Ambulatory Visit | Attending: Internal Medicine | Admitting: Internal Medicine

## 2020-05-08 ENCOUNTER — Other Ambulatory Visit: Payer: Self-pay

## 2020-05-08 ENCOUNTER — Encounter: Payer: Self-pay | Admitting: Emergency Medicine

## 2020-05-08 DIAGNOSIS — Z79899 Other long term (current) drug therapy: Secondary | ICD-10-CM | POA: Insufficient documentation

## 2020-05-08 DIAGNOSIS — S39011A Strain of muscle, fascia and tendon of abdomen, initial encounter: Secondary | ICD-10-CM | POA: Insufficient documentation

## 2020-05-08 DIAGNOSIS — Z87891 Personal history of nicotine dependence: Secondary | ICD-10-CM | POA: Diagnosis not present

## 2020-05-08 DIAGNOSIS — X500XXA Overexertion from strenuous movement or load, initial encounter: Secondary | ICD-10-CM | POA: Insufficient documentation

## 2020-05-08 DIAGNOSIS — Y92520 Airport as the place of occurrence of the external cause: Secondary | ICD-10-CM | POA: Insufficient documentation

## 2020-05-08 DIAGNOSIS — Y9301 Activity, walking, marching and hiking: Secondary | ICD-10-CM | POA: Diagnosis not present

## 2020-05-08 DIAGNOSIS — M546 Pain in thoracic spine: Secondary | ICD-10-CM | POA: Insufficient documentation

## 2020-05-08 DIAGNOSIS — Z96643 Presence of artificial hip joint, bilateral: Secondary | ICD-10-CM | POA: Diagnosis not present

## 2020-05-08 DIAGNOSIS — R109 Unspecified abdominal pain: Secondary | ICD-10-CM | POA: Insufficient documentation

## 2020-05-08 DIAGNOSIS — Y999 Unspecified external cause status: Secondary | ICD-10-CM | POA: Insufficient documentation

## 2020-05-08 DIAGNOSIS — M7989 Other specified soft tissue disorders: Secondary | ICD-10-CM | POA: Insufficient documentation

## 2020-05-08 DIAGNOSIS — R0602 Shortness of breath: Secondary | ICD-10-CM | POA: Insufficient documentation

## 2020-05-08 DIAGNOSIS — M79662 Pain in left lower leg: Secondary | ICD-10-CM | POA: Insufficient documentation

## 2020-05-08 LAB — COMPREHENSIVE METABOLIC PANEL
ALT: 17 U/L (ref 0–44)
AST: 20 U/L (ref 15–41)
Albumin: 4 g/dL (ref 3.5–5.0)
Alkaline Phosphatase: 69 U/L (ref 38–126)
Anion gap: 9 (ref 5–15)
BUN: 20 mg/dL (ref 8–23)
CO2: 25 mmol/L (ref 22–32)
Calcium: 8.7 mg/dL — ABNORMAL LOW (ref 8.9–10.3)
Chloride: 104 mmol/L (ref 98–111)
Creatinine, Ser: 0.92 mg/dL (ref 0.44–1.00)
GFR calc Af Amer: >60 mL/min (ref >60)
GFR calc non Af Amer: >60 mL/min (ref >60)
Glucose, Bld: 127 mg/dL — ABNORMAL HIGH (ref 70–99)
Potassium: 4 mmol/L (ref 3.5–5.1)
Sodium: 138 mmol/L (ref 135–145)
Total Bilirubin: 0.5 mg/dL (ref 0.3–1.2)
Total Protein: 6.9 g/dL (ref 6.5–8.1)

## 2020-05-08 LAB — TROPONIN I (HIGH SENSITIVITY): Troponin I (High Sensitivity): 3 ng/L (ref <18)

## 2020-05-08 LAB — FIBRIN DERIVATIVES D-DIMER (ARMC ONLY): Fibrin derivatives D-dimer (ARMC): 2868.75 ng/mL (FEU) — ABNORMAL HIGH (ref 0.00–499.00)

## 2020-05-08 NOTE — ED Notes (Signed)
Pt asking for something to eat.  Advised nothing by mouth until sen by MD.

## 2020-05-08 NOTE — ED Triage Notes (Signed)
First RN Note; Pt presents to ED via POV, pt referred to ED by [facility] due to elevated D-Dimer. Per [facility] RN pt initial complaint of L thoracic back pain, reports D-Dimer > 3,000.

## 2020-05-08 NOTE — ED Triage Notes (Signed)
Pt just flew back from [place].  Was at urgent care for mid back pain and d-dimer was done and came back elevated so pt sent to ED for r/o PE. Cbc was done at urgent care and pt reports had CXR done but unable to find this result or test.  No chest pain at this time. Pain eased some with vicodin.

## 2020-05-09 ENCOUNTER — Emergency Department: Payer: Medicare PPO

## 2020-05-09 ENCOUNTER — Emergency Department
Admission: EM | Admit: 2020-05-09 | Discharge: 2020-05-09 | Disposition: A | Discharge status: Home / Self Care | Payer: Medicare PPO | Attending: Emergency Medicine | Admitting: Emergency Medicine

## 2020-05-09 DIAGNOSIS — T148XXA Other injury of unspecified body region, initial encounter: Secondary | ICD-10-CM

## 2020-05-09 DIAGNOSIS — R109 Unspecified abdominal pain: Secondary | ICD-10-CM

## 2020-05-09 LAB — TROPONIN I (HIGH SENSITIVITY): Troponin I (High Sensitivity): 4 ng/L (ref <18)

## 2020-05-09 MED ORDER — LIDOCAINE 5 % EX PTCH
1.0000 | MEDICATED_PATCH | CUTANEOUS | Status: DC
  Administered 2020-05-09: 1 via TRANSDERMAL

## 2020-05-09 MED ORDER — TRAMADOL HCL 50 MG PO TABS
50.0000 mg | ORAL_TABLET | Freq: Once | ORAL | Status: AC
  Administered 2020-05-09: 50 mg via ORAL
  Filled 2020-05-09: qty 1

## 2020-05-09 MED ORDER — KETOROLAC TROMETHAMINE 30 MG/ML IJ SOLN
15.0000 mg | Freq: Once | INTRAMUSCULAR | Status: AC
  Administered 2020-05-09: 15 mg via INTRAVENOUS
  Filled 2020-05-09: qty 1

## 2020-05-09 MED ORDER — IOHEXOL 350 MG/ML SOLN
100.0000 mL | Freq: Once | INTRAVENOUS | Status: AC | PRN
  Administered 2020-05-09: 100 mL via INTRAVENOUS

## 2020-05-09 MED ORDER — ACETAMINOPHEN 500 MG PO TABS
1000.0000 mg | ORAL_TABLET | Freq: Three times a day (TID) | ORAL | 0 refills | Status: AC | PRN
Start: 2020-05-09 — End: 2021-05-09

## 2020-05-09 MED ORDER — TRAMADOL HCL 50 MG PO TABS: 50.0000 mg | ORAL_TABLET | Freq: Four times a day (QID) | ORAL | 0 refills | Status: DC | PRN

## 2020-05-09 MED ORDER — LIDOCAINE 5 % EX PTCH: 1.0000 | MEDICATED_PATCH | Freq: Two times a day (BID) | CUTANEOUS | 0 refills | Status: AC

## 2020-05-09 MED ORDER — ACETAMINOPHEN 500 MG PO TABS
1000.0000 mg | ORAL_TABLET | Freq: Once | ORAL | Status: AC
  Administered 2020-05-09: 1000 mg via ORAL
  Filled 2020-05-09: qty 2

## 2020-05-09 NOTE — Discharge Instructions (Signed)
Pain control: Take tylenol 1000mg  every 8 hours. Take 50mg  of tramadol every 6 hours for breakthrough pain. If you need the tramadol make sure to take one senokot as well to prevent constipation. Place a lidocaine patch every 12 hours as needed. Apply heat.  Do not drink alcohol, drive or participate in any other potentially dangerous activities while taking this medication as it may make you sleepy. Do not take this medication with any other sedating medications, either prescription or over-the-counter.

## 2020-05-09 NOTE — ED Provider Notes (Signed)
[facility] Emergency Department Provider Note  ____________________________________________  Time seen: Approximately 12:45 AM  I have reviewed the triage vital signs and the nursing notes.   HISTORY  Chief Complaint Abnormal Lab   HPI Claudia Andersen is a 79 y.o. 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   Patient reports that the pain started a few days ago after she came back from a trip.  She flew from [place] to [place].  One flight was an hour and the other one 2hr. she use compression stockings.  She walked around at the airport during her layover.  Upon her arrival home she started having back pain.  She describes the pain as sharp, pleuritic, that is starts at the bra line on her back worse on the left and radiates across her rib cage.  The pain is worse with deep inspiration and movement of the torso.  No cough or fever, no abdominal pain, no dysuria or hematuria, no prior history of kidney stones, no chest pain or shortness of breath, no nausea or vomiting.  No leg pain or swelling.  No hemoptysis.  Past Medical History:  Diagnosis Date  . Ankle fracture, bimalleolar, closed, right, initial encounter 10/26/2018   d/t fall 10/26/18  . Arthritis   . Back abrasion    CRUSHED VERTEBRAE  . Closed right radial fracture 10/26/2018   s/p a fall  . Compression of lumbar vertebra ([place])    L1 and L4 (from fall November 05, 2018)  . Depression   . Gallstones   . GERD (gastroesophageal reflux disease)   . Migraines   . Pulmonary emboli (HCC)    history of emboli    Patient Active Problem List   Diagnosis Date Noted  . Neck pain 04/10/2020  . Trochanteric bursitis 04/03/2020  . Herpes labialis 04/03/2020  . Dysuria 04/03/2020  . SOB (shortness of breath) on exertion 02/21/2020  . Chronic venous insufficiency 02/28/2019  . Lymphedema  02/28/2019  . Acetabular fracture ([place]) 01/09/2019  . Preoperative evaluation to rule out surgical contraindication 11/23/2018  . Need for shingles vaccine 11/16/2018  . Osteoporosis 10/26/2018  . Leg edema 10/26/2018  . Carotid artery stenosis 10/26/2018  . History of pulmonary embolism 10/26/2018  . History of DVT of lower extremity 10/26/2018  . Osteoarthritis 10/26/2018  . DDD (degenerative disc disease), 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/2018  . Screen for colon cancer 07/31/2017  . GERD (gastroesophageal reflux disease) 07/31/2017  . Depression, major, single episode, complete remission ([place]) 07/31/2017  . Insomnia 07/31/2017    Past Surgical History:  Procedure Laterality Date  . ABDOMINAL HYSTERECTOMY    . CATARACT EXTRACTION W/PHACO Right 03/27/2018   Procedure: CATARACT EXTRACTION PHACO AND INTRAOCULAR LENS PLACEMENT (IOC);  Surgeon: Birder Robson, MD;  Location: [facility] ORS;  Service: Ophthalmology;  Laterality: Right;  [place] 00:33 AP% 15.3 CDE 5.18 Fluid pack lot # [id number]  . CATARACT EXTRACTION W/PHACO Left 06/26/2018   Procedure: CATARACT EXTRACTION PHACO AND INTRAOCULAR LENS PLACEMENT ([place]);  Surgeon: Birder Robson, MD;  Location: [facility] ORS;  Service: Ophthalmology;  Laterality: Left;  [place] 00:38 AP% 15.2 CDE 5.80 Fluid pack lot # [id number]  . FRACTURE SURGERY     RIGHT RADIAL  . JOINT REPLACEMENT     LEFT TKR  . 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 TRIPLE SUBTALAR FUSION      Prior to Admission medications   Medication Sig Start Date End Date Taking? Authorizing Provider  acetaminophen (TYLENOL) 500 MG tablet Take 500 mg by mouth 2 (two) times daily as needed (PAIN.).    [provider]  acetaminophen (TYLENOL) 500 MG tablet Take 2 tablets (1,000 mg total) by mouth every 8 (eight) hours as needed for mild pain,  moderate pain, fever or headache. 05/09/20 05/09/21  Alfred Levins, [place], MD  atorvastatin (LIPITOR) 10 MG tablet Take 1 tablet (10 mg total) by mouth daily. 02/21/20   Burnard Hawthorne, FNP  baclofen 5 MG TABS Take 5 mg by mouth 3 (three) times daily as needed for muscle spasms. 04/10/20   Burnard Hawthorne, FNP  Biotin 2500 MCG CAPS Take 2,500 mcg by mouth daily.    [provider]  Calcium Carb-Cholecalciferol (CALCIUM 600 + D PO) Take 2 tablets by mouth daily.     [provider]  cyclobenzaprine (FLEXERIL) 10 MG tablet Take 10 mg by mouth 3 (three) times daily as needed for muscle spasms.    [provider]  Fluticasone Furoate (FLONASE SENSIMIST NA) Place 1 spray into both nostrils daily as needed (allergies).    [provider]  fluvoxaMINE (LUVOX) 100 MG tablet TAKE 1/2-1 TABLET BY MOUTH DAILY 02/21/20   Burnard Hawthorne, FNP  furosemide (LASIX) 20 MG tablet Take 1 tablet (20 mg total) by mouth daily. 02/20/19   Burnard Hawthorne, FNP  gabapentin (NEURONTIN) 300 MG capsule Take 1 capsule (300 mg total) by mouth at bedtime. 02/21/20   Burnard Hawthorne, FNP  lidocaine (LIDODERM) 5 % Place 1 patch onto the skin every 12 (twelve) hours. Remove & Discard patch within 12 hours or as directed by MD 05/09/20 05/09/21  Alfred Levins, [place], MD  meloxicam (MOBIC) 7.5 MG tablet Take 1 tablet (7.5 mg total) by mouth daily as needed for pain. 04/10/20   Burnard Hawthorne, FNP  mupirocin ointment (BACTROBAN) 2 % Apply 1 application topically 2 (two) times daily. Patient not taking: Reported on 04/10/2020 02/27/19   Burnard Hawthorne, FNP  naproxen (NAPROSYN) 375 MG tablet naproxen 375 mg tablet    [provider]  pantoprazole (PROTONIX) 40 MG tablet TAKE ONE TABLET BY MOUTH TWICE A DAY 12/04/19   Burnard Hawthorne, FNP  potassium chloride (KLOR-CON) 10 MEQ tablet TAKE ONE TABLET BY MOUTH DAILY WHILE TAKING HCTZ (DIURETIC) 08/08/19   Burnard Hawthorne, FNP  predniSONE  (DELTASONE) 10 MG tablet Take 40 mg by mouth on day 1, then taper 10 mg daily until gone 04/10/20   Burnard Hawthorne, FNP  traMADol (ULTRAM) 50 MG tablet Take 1 tablet (50 mg total) by mouth every 6 (six) hours as needed. 05/09/20 05/09/21  Rudene Re, MD  traZODone (DESYREL) 50 MG tablet Take 50-100 mg by mouth at bedtime as needed for sleep.     [provider]  triamcinolone (KENALOG) 0.025 % cream Apply 1 application topically 2 (two) times daily. 04/03/20   Burnard Hawthorne, FNP  valACYclovir (VALTREX) 1000 MG tablet Take two tablets ( total 2000 mg) by mouth q12h x 1 day; Start: ASAP after symptom onset 04/03/20   Burnard Hawthorne, FNP    Allergies Patient has no known allergies.  Family History  Problem Relation Age of Onset  . Arthritis Mother   . Heart disease Mother   .  Stroke Mother   . Arthritis Father   . Heart disease Father   . Stroke Father   . Breast cancer Neg Hx   . Colon cancer Neg Hx     Social History Social History   Tobacco Use  . Smoking status: Former Research scientist (life sciences)  . Smokeless tobacco: Never Used  Vaping Use  . Vaping Use: Never used  Substance Use Topics  . Alcohol use: Yes    Comment: 5-7 glasses per week  . Drug use: Not on file    Review of Systems  Constitutional: Negative for fever. Eyes: Negative for visual changes. ENT: Negative for sore throat. Neck: No neck pain  Cardiovascular: Negative for chest pain. Respiratory: Negative for shortness of breath. Gastrointestinal: Negative for abdominal pain, vomiting or diarrhea. Genitourinary: Negative for dysuria. Musculoskeletal: + mid back pain. Skin: Negative for rash. Neurological: Negative for headaches, weakness or numbness. Psych: No SI or HI  ____________________________________________   PHYSICAL EXAM:  VITAL SIGNS: ED Triage Vitals  Enc Vitals Group     BP 05/08/20 1826 (!) 156/83     Pulse Rate 05/08/20 1824 77     Resp 05/08/20 1824 16     Temp 05/08/20  1824 98.4 F (36.9 C)     Temp Source 05/08/20 1824 Oral     SpO2 05/08/20 1824 94 %     Weight 05/08/20 1826 160 lb (72.6 kg)     Height 05/08/20 1826 5\' 2"  (1.575 m)     Head Circumference --      Peak Flow --      Pain Score 05/08/20 1825 6     Pain Loc --      Pain Edu? --      Excl. in [place]? --     Constitutional: Alert and oriented. Well appearing and in no apparent distress. HEENT:      Head: Normocephalic and atraumatic.         Eyes: Conjunctivae are normal. Sclera is non-icteric.       Mouth/Throat: Mucous membranes are moist.       Neck: Supple with no signs of meningismus. Cardiovascular: Regular rate and rhythm. No murmurs, gallops, or rubs. 2+ symmetrical distal pulses are present in all extremities.  Respiratory: Normal respiratory effort. Lungs are clear to auscultation bilaterally. No wheezes, crackles, or rhonchi.  Gastrointestinal: Soft, mild tenderness to palpation the left upper quadrant with no rebound or guarding . Genitourinary: L CVA tenderness. Musculoskeletal: 1+ pitting edema bilaterally Neurologic: Normal speech and language. Face is symmetric. Moving all extremities. No gross focal neurologic deficits are appreciated. Skin: Skin is warm, dry and intact. No rash noted. Psychiatric: Mood and affect are normal. Speech and behavior are normal.  ____________________________________________   LABS (all labs ordered are listed, but only abnormal results are displayed)  Labs Reviewed  COMPREHENSIVE METABOLIC PANEL - Abnormal; Notable for the following components:      Result Value   Glucose, Bld 127 (*)    Calcium 8.7 (*)    All other components within normal limits  TROPONIN I (HIGH SENSITIVITY)  TROPONIN I (HIGH SENSITIVITY)   ____________________________________________  EKG  ED ECG REPORT I, Rudene Re, the attending physician, personally viewed and interpreted this ECG.  Normal sinus rhythm, rate of 73, normal intervals, normal axis, T  wave inversions in inferior and anterior leads with no ST elevations or depressions.  No significant changes compared to prior. ____________________________________________  RADIOLOGY  I have personally reviewed the images performed during  this visit and I agree with the Radiologist's read.   Interpretation by Radiologist:  CT Angio Chest PE W and/or Wo Contrast  Result Date: 05/09/2020 CLINICAL DATA:  Left chest pain with elevated D-dimer EXAM: CT ANGIOGRAPHY CHEST CT ABDOMEN AND PELVIS WITH CONTRAST TECHNIQUE: Multidetector CT imaging of the chest was performed using the standard protocol during bolus administration of intravenous contrast. Multiplanar CT image reconstructions and MIPs were obtained to evaluate the vascular anatomy. Multidetector CT imaging of the abdomen and pelvis was performed using the standard protocol during bolus administration of intravenous contrast. CONTRAST:  139mL OMNIPAQUE IOHEXOL 350 MG/ML SOLN COMPARISON:  None. FINDINGS: CTA CHEST FINDINGS Cardiovascular: Contrast injection is sufficient to demonstrate satisfactory opacification of the pulmonary arteries to the segmental level. There is no pulmonary embolus. The main pulmonary artery is within normal limits for size. There is no CT evidence of acute right heart strain. The visualized aorta is normal. There is a normal 3-vessel arch branching pattern. Heart size is normal, without pericardial effusion. Mediastinum/Nodes: No mediastinal, hilar or axillary lymphadenopathy. The visualized thyroid and thoracic esophageal course are unremarkable. Lungs/Pleura: No pulmonary nodules or masses. No pleural effusion or pneumothorax. No focal airspace consolidation. No focal pleural abnormality. Musculoskeletal: Multiple old left rib fractures. Review of the MIP images confirms the above findings. CT ABDOMEN and PELVIS FINDINGS Hepatobiliary: Normal hepatic contours and density. No visible biliary dilatation. Normal gallbladder.  Pancreas: Normal contours without ductal dilatation. No peripancreatic fluid collection. Spleen: Normal. Adrenals/Urinary Tract: --Adrenal glands: Normal. --Right kidney/ureter: No hydronephrosis or perinephric stranding. No nephrolithiasis. No obstructing ureteral stones. --Left kidney/ureter: No hydronephrosis or perinephric stranding. No nephrolithiasis. No obstructing ureteral stones. --Urinary bladder: Unremarkable. Stomach/Bowel: --Stomach/Duodenum: No hiatal hernia or other gastric abnormality. Normal duodenal course and caliber. --Small bowel: No dilatation or inflammation. --Colon: No focal abnormality. --Appendix: Normal. Vascular/Lymphatic: Normal course and caliber of the major abdominal vessels. No abdominal or pelvic lymphadenopathy. Reproductive: Status post hysterectomy. No adnexal mass. Musculoskeletal. Right total hip arthroplasty. There are chronic L1 and L4 compression fractures. Other: None. IMPRESSION: 1. No pulmonary embolus or acute aortic syndrome. 2. No acute abnormality of the chest, abdomen or pelvis. 3. Chronic L1 and L4 compression fractures. Electronically Signed   By: Ulyses Jarred M.D.   On: 05/09/2020 01:30   CT ABDOMEN PELVIS W CONTRAST  Result Date: 05/09/2020 CLINICAL DATA:  Left chest pain with elevated D-dimer EXAM: CT ANGIOGRAPHY CHEST CT ABDOMEN AND PELVIS WITH CONTRAST TECHNIQUE: Multidetector CT imaging of the chest was performed using the standard protocol during bolus administration of intravenous contrast. Multiplanar CT image reconstructions and MIPs were obtained to evaluate the vascular anatomy. Multidetector CT imaging of the abdomen and pelvis was performed using the standard protocol during bolus administration of intravenous contrast. CONTRAST:  137mL OMNIPAQUE IOHEXOL 350 MG/ML SOLN COMPARISON:  None. FINDINGS: CTA CHEST FINDINGS Cardiovascular: Contrast injection is sufficient to demonstrate satisfactory opacification of the pulmonary arteries to the  segmental level. There is no pulmonary embolus. The main pulmonary artery is within normal limits for size. There is no CT evidence of acute right heart strain. The visualized aorta is normal. There is a normal 3-vessel arch branching pattern. Heart size is normal, without pericardial effusion. Mediastinum/Nodes: No mediastinal, hilar or axillary lymphadenopathy. The visualized thyroid and thoracic esophageal course are unremarkable. Lungs/Pleura: No pulmonary nodules or masses. No pleural effusion or pneumothorax. No focal airspace consolidation. No focal pleural abnormality. Musculoskeletal: Multiple old left rib fractures. Review of the MIP images confirms the  above findings. CT ABDOMEN and PELVIS FINDINGS Hepatobiliary: Normal hepatic contours and density. No visible biliary dilatation. Normal gallbladder. Pancreas: Normal contours without ductal dilatation. No peripancreatic fluid collection. Spleen: Normal. Adrenals/Urinary Tract: --Adrenal glands: Normal. --Right kidney/ureter: No hydronephrosis or perinephric stranding. No nephrolithiasis. No obstructing ureteral stones. --Left kidney/ureter: No hydronephrosis or perinephric stranding. No nephrolithiasis. No obstructing ureteral stones. --Urinary bladder: Unremarkable. Stomach/Bowel: --Stomach/Duodenum: No hiatal hernia or other gastric abnormality. Normal duodenal course and caliber. --Small bowel: No dilatation or inflammation. --Colon: No focal abnormality. --Appendix: Normal. Vascular/Lymphatic: Normal course and caliber of the major abdominal vessels. No abdominal or pelvic lymphadenopathy. Reproductive: Status post hysterectomy. No adnexal mass. Musculoskeletal. Right total hip arthroplasty. There are chronic L1 and L4 compression fractures. Other: None. IMPRESSION: 1. No pulmonary embolus or acute aortic syndrome. 2. No acute abnormality of the chest, abdomen or pelvis. 3. Chronic L1 and L4 compression fractures. Electronically Signed   By: Ulyses Jarred M.D.   On: 05/09/2020 01:30      ____________________________________________   PROCEDURES  Procedure(s) performed:yes .1-3 Lead EKG Interpretation Performed by: Rudene Re, MD Authorized by: Rudene Re, MD     Interpretation: normal     ECG rate assessment: normal     Rhythm: sinus rhythm     Ectopy: none     Conduction: normal     Critical Care performed:  None ____________________________________________   INITIAL IMPRESSION / ASSESSMENT AND PLAN / ED COURSE  79 y.o. 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  Patient is well-appearing in no distress with normal work of breathing, normal sats, abdomen is soft with mild left upper quadrant tenderness, she also has left flank tenderness.  Lungs are clear to auscultation.  Patient went to urgent care and had a D-dimer that was greater than 3000 and therefore sent here for further evaluation.  Differential diagnoses including PE versus kidney stone versus pyelonephritis versus peptic ulcer disease versus MSK versus compression fracture.  We will give Toradol for pain, will get a CT angio of the chest and also of the abdomen.  We will check her urine.  EKG is unchanged from baseline.  Troponin is negative.  Chemistry panels with no significant abnormalities.  I am able to see the results of the CBC that in urgent care which show mild stable anemia with no leukocytosis.  Unfortunately I am unable to see the results of the D-dimer, chest x-ray, and back x-ray which were done.  According to the patient they were both negative.  History gathered from patient and her daughter who is at bedside.  Care discussed with both of them.  Patient placed on telemetry for close monitoring.  Old medical records reviewed.     _________________________ 2:45 AM on  05/09/2020 -----------------------------------------  CT of the chest, abdomen and pelvis showing no acute findings, confirmed by radiology.  Patient looks very comfortable laying back but when she tries to stand up or move her torso the pain becomes noticeable therefore I think most likely MSK at this time.  May be a muscle strain.  No acute compression fracture seen on CT.  Will discharge home with lidocaine patch, Tylenol, tramadol as needed, heat pads and follow-up with PCP.  Discussed my standard return precautions with patient and her daughter.  Patient did not produce a urine sample and did not want to wait for  that.     _____________________________________________ Please note:  Patient was evaluated in Emergency Department today for the symptoms described in the history of present illness. Patient was evaluated in the context of the global COVID-19 pandemic, which necessitated consideration that the patient might be at risk for infection with the SARS-CoV-2 virus that causes COVID-19. Institutional protocols and algorithms that pertain to the evaluation of patients at risk for COVID-19 are in a state of rapid change based on information released by regulatory bodies including the CDC and federal and state organizations. These policies and algorithms were followed during the patient's care in the ED.  Some ED evaluations and interventions may be delayed as a result of limited staffing during the pandemic.   [place] Controlled Substance Database was reviewed by me. ____________________________________________   FINAL CLINICAL IMPRESSION(S) / ED DIAGNOSES   Final diagnoses:  Flank pain  Muscle strain      NEW MEDICATIONS STARTED DURING THIS VISIT:  ED Discharge Orders         Ordered    lidocaine (LIDODERM) 5 %  Every 12 hours     Discontinue  Reprint     05/09/20 0242    acetaminophen (TYLENOL) 500 MG tablet  Every 8 hours PRN     Discontinue  Reprint     05/09/20 0242    traMADol  (ULTRAM) 50 MG tablet  Every 6 hours PRN     Discontinue  Reprint     05/09/20 0242           Note:  This document was prepared using Dragon voice recognition software and may include unintentional dictation errors.    Alfred Levins, [place], MD 05/09/20 [phone]

## 2020-05-09 NOTE — ED Notes (Signed)
Pt states she is unable to urinate at this time.

## 2020-05-11 ENCOUNTER — Encounter (INDEPENDENT_AMBULATORY_CARE_PROVIDER_SITE_OTHER): Payer: Self-pay | Admitting: Vascular Surgery

## 2020-05-11 ENCOUNTER — Encounter: Payer: Self-pay | Admitting: Nurse Practitioner

## 2020-05-11 ENCOUNTER — Ambulatory Visit (INDEPENDENT_AMBULATORY_CARE_PROVIDER_SITE_OTHER): Payer: Medicare PPO | Admitting: Vascular Surgery

## 2020-05-11 ENCOUNTER — Other Ambulatory Visit: Payer: Self-pay

## 2020-05-11 ENCOUNTER — Other Ambulatory Visit (INDEPENDENT_AMBULATORY_CARE_PROVIDER_SITE_OTHER): Payer: Self-pay | Admitting: Vascular Surgery

## 2020-05-11 ENCOUNTER — Ambulatory Visit: Payer: Medicare PPO | Admitting: Nurse Practitioner

## 2020-05-11 VITALS — BP 150/86 | HR 74 | Temp 97.9°F | Ht 62.0 in | Wt 177.4 lb

## 2020-05-11 VITALS — BP 141/81 | HR 85 | Resp 16 | Ht 62.0 in | Wt 176.0 lb

## 2020-05-11 DIAGNOSIS — M546 Pain in thoracic spine: Secondary | ICD-10-CM | POA: Insufficient documentation

## 2020-05-11 DIAGNOSIS — M7989 Other specified soft tissue disorders: Secondary | ICD-10-CM

## 2020-05-11 DIAGNOSIS — I890 Lymphedema, not elsewhere classified: Secondary | ICD-10-CM

## 2020-05-11 DIAGNOSIS — S82409A Unspecified fracture of shaft of unspecified fibula, initial encounter for closed fracture: Secondary | ICD-10-CM

## 2020-05-11 DIAGNOSIS — M1990 Unspecified osteoarthritis, unspecified site: Secondary | ICD-10-CM

## 2020-05-11 DIAGNOSIS — R7989 Other specified abnormal findings of blood chemistry: Secondary | ICD-10-CM

## 2020-05-11 DIAGNOSIS — M549 Dorsalgia, unspecified: Secondary | ICD-10-CM | POA: Insufficient documentation

## 2020-05-11 DIAGNOSIS — S82209A Unspecified fracture of shaft of unspecified tibia, initial encounter for closed fracture: Secondary | ICD-10-CM | POA: Insufficient documentation

## 2020-05-11 DIAGNOSIS — K219 Gastro-esophageal reflux disease without esophagitis: Secondary | ICD-10-CM

## 2020-05-11 DIAGNOSIS — G43909 Migraine, unspecified, not intractable, without status migrainosus: Secondary | ICD-10-CM | POA: Insufficient documentation

## 2020-05-11 DIAGNOSIS — R29898 Other symptoms and signs involving the musculoskeletal system: Secondary | ICD-10-CM | POA: Insufficient documentation

## 2020-05-11 DIAGNOSIS — I872 Venous insufficiency (chronic) (peripheral): Secondary | ICD-10-CM | POA: Diagnosis not present

## 2020-05-11 DIAGNOSIS — M25669 Stiffness of unspecified knee, not elsewhere classified: Secondary | ICD-10-CM | POA: Insufficient documentation

## 2020-05-11 DIAGNOSIS — Z9660 Presence of unspecified orthopedic joint implant: Secondary | ICD-10-CM | POA: Insufficient documentation

## 2020-05-11 DIAGNOSIS — M8440XA Pathological fracture, unspecified site, initial encounter for fracture: Secondary | ICD-10-CM | POA: Insufficient documentation

## 2020-05-11 DIAGNOSIS — I6523 Occlusion and stenosis of bilateral carotid arteries: Secondary | ICD-10-CM

## 2020-05-11 DIAGNOSIS — I2699 Other pulmonary embolism without acute cor pulmonale: Secondary | ICD-10-CM | POA: Insufficient documentation

## 2020-05-11 HISTORY — DX: Unspecified fracture of shaft of unspecified fibula, initial encounter for closed fracture: S82.409A

## 2020-05-11 MED ORDER — POTASSIUM CHLORIDE ER 10 MEQ PO TBCR: EXTENDED_RELEASE_TABLET | ORAL | 0 refills | Status: DC

## 2020-05-11 NOTE — Progress Notes (Signed)
MRN : [id number]  Claudia Andersen is a 79 y.o. ([birth year]) female who presents with chief complaint of  Chief Complaint  Patient presents with   Follow-up    bilateral leg swelling with pain  .  History of Present Illness:   The patient returns to the office for followup evaluation regarding leg swelling.  Her swelling acutely worsened and she was seen in the ER where a CT scan of the chest was negative for PE.  She notes the pain associated with swelling is much worse. There have not been any interval development of a ulcerations or wounds.  Since the previous visit the patient has not been wearing graduated compression stockings.  The patient also states elevation during the day and exercise is being done too.   Current Meds  Medication Sig   acetaminophen (TYLENOL) 500 MG tablet Take 500 mg by mouth 2 (two) times daily as needed (PAIN.).   acetaminophen (TYLENOL) 500 MG tablet Take 2 tablets (1,000 mg total) by mouth every 8 (eight) hours as needed for mild pain, moderate pain, fever or headache.   atorvastatin (LIPITOR) 10 MG tablet Take 1 tablet (10 mg total) by mouth daily.   baclofen 5 MG TABS Take 5 mg by mouth 3 (three) times daily as needed for muscle spasms.   Biotin 2500 MCG CAPS Take 2,500 mcg by mouth daily.   Calcium Carb-Cholecalciferol (CALCIUM 600 + D PO) Take 2 tablets by mouth daily.    cyclobenzaprine (FLEXERIL) 10 MG tablet Take 10 mg by mouth 3 (three) times daily as needed for muscle spasms.   furosemide (LASIX) 20 MG tablet Take 1 tablet (20 mg total) by mouth daily.   gabapentin (NEURONTIN) 300 MG capsule Take 1 capsule (300 mg total) by mouth at bedtime.   hydrochlorothiazide (MICROZIDE) 12.5 MG capsule Take 12.5 mg by mouth daily.   HYDROcodone-acetaminophen (NORCO/VICODIN) 5-325 MG tablet Take by mouth.   lidocaine (LIDODERM) 5 % Place 1 patch onto the skin every 12 (twelve) hours. Remove & Discard patch within 12 hours or as directed by MD    meloxicam (MOBIC) 7.5 MG tablet Take 1 tablet (7.5 mg total) by mouth daily as needed for pain.   Multiple Vitamins-Iron (MULTIVITAMIN/IRON PO) Take by mouth daily.   pantoprazole (PROTONIX) 40 MG tablet TAKE ONE TABLET BY MOUTH TWICE A DAY   potassium chloride (KLOR-CON) 10 MEQ tablet TAKE ONE TABLET BY MOUTH DAILY WHILE TAKING HCTZ (DIURETIC)   traZODone (DESYREL) 50 MG tablet Take 50-100 mg by mouth at bedtime as needed for sleep.    valACYclovir (VALTREX) 1000 MG tablet Take two tablets ( total 2000 mg) by mouth q12h x 1 day; Start: ASAP after symptom onset    Past Medical History:  Diagnosis Date   Ankle fracture, bimalleolar, closed, right, initial encounter 10/26/2018   d/t fall 10/26/18   Arthritis    Back abrasion    CRUSHED VERTEBRAE   Closed right radial fracture 10/26/2018   s/p a fall   Compression of lumbar vertebra (HCC)    L1 and L4 (from fall November 05, 2018)   Depression    Gallstones    GERD (gastroesophageal reflux disease)    Migraines    Pulmonary emboli (HCC)    history of emboli    Past Surgical History:  Procedure Laterality Date   ABDOMINAL HYSTERECTOMY     CATARACT EXTRACTION W/PHACO Right 03/27/2018   Procedure: CATARACT EXTRACTION PHACO AND INTRAOCULAR LENS PLACEMENT ([place]);  Surgeon:  Birder Robson, MD;  Location: [facility] ORS;  Service: Ophthalmology;  Laterality: Right;  [place] 00:33 AP% 15.3 CDE 5.18 Fluid pack lot # [id number]   CATARACT EXTRACTION W/PHACO Left 06/26/2018   Procedure: CATARACT EXTRACTION PHACO AND INTRAOCULAR LENS PLACEMENT ([place]);  Surgeon: Birder Robson, MD;  Location: [facility] ORS;  Service: Ophthalmology;  Laterality: Left;  [place] 00:38 AP% 15.2 CDE 5.80 Fluid pack lot # [id number]   FRACTURE SURGERY     RIGHT RADIAL   JOINT REPLACEMENT     LEFT TKR   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   Procedure: TOTAL HIP ARTHROPLASTY ANTERIOR APPROACH;  Surgeon: Lovell Sheehan, MD;  Location: [facility] ORS;  Service:  Orthopedics;  Laterality: Right;   TRIPLE SUBTALAR FUSION      Social History Social History   Tobacco Use   Smoking status: Former Smoker   Smokeless tobacco: Never Used  Scientific laboratory technician Use: Never used  Substance Use Topics   Alcohol use: Yes    Comment: 5-7 glasses per week   Drug use: Not on file    Family History Family History  Problem Relation Age of Onset   Arthritis Mother    Heart disease Mother    Stroke Mother    Arthritis Father    Heart disease Father    Stroke Father    Breast cancer Neg Hx    Colon cancer Neg Hx     No Known Allergies   REVIEW OF SYSTEMS (Negative unless checked)  Constitutional: [] Weight loss  [] Fever  [] Chills Cardiac: [] Chest pain   [] Chest pressure   [] Palpitations   [] Shortness of breath when laying flat   [] Shortness of breath with exertion. Vascular:  [] Pain in legs with walking   [x] Pain in legs at rest  [] History of DVT   [] Phlebitis   [x] Swelling in legs   [] Varicose veins   [] Non-healing ulcers Pulmonary:   [] Uses home oxygen   [] Productive cough   [] Hemoptysis   [] Wheeze  [] COPD   [] Asthma Neurologic:  [] Dizziness   [] Seizures   [] History of stroke   [] History of TIA  [] Aphasia   [] Vissual changes   [] Weakness or numbness in arm   [] Weakness or numbness in leg Musculoskeletal:   [] Joint swelling   [] Joint pain   [] Low back pain Hematologic:  [] Easy bruising  [] Easy bleeding   [] Hypercoagulable state   [] Anemic Gastrointestinal:  [] Diarrhea   [] Vomiting  [] Gastroesophageal reflux/heartburn   [] Difficulty swallowing. Genitourinary:  [] Chronic kidney disease   [] Difficult urination  [] Frequent urination   [] Blood in urine Skin:  [] Rashes   [] Ulcers  Psychological:  [] History of anxiety   []  History of major depression.  Physical Examination  Vitals:   05/11/20 1327  BP: (!) 141/81  Pulse: 85  Resp: 16  Weight: 176 lb (79.8 kg)  Height: 5\' 2"  (1.575 m)   Body mass index is 32.19 kg/m. Gen: WD/WN,  NAD Head: [place]/AT, No temporalis wasting.  Ear/Nose/Throat: Hearing grossly intact, nares w/o erythema or drainage Eyes: PER, EOMI, sclera nonicteric.  Neck: Supple, no large masses.   Pulmonary:  Good air movement, no audible wheezing bilaterally, no use of accessory muscles.  Cardiac: RRR, no JVD Vascular: scattered varicosities present bilaterally.  Mild venous stasis changes to the legs bilaterally.  4+ hard red non-pitting edema Vessel Right Left  Radial Palpable Palpable  Gastrointestinal: Non-distended. No guarding/no peritoneal signs.  Musculoskeletal: M/S 5/5 throughout.  No deformity or atrophy.  Neurologic: CN 2-12 intact. Symmetrical.  Speech is fluent. Motor exam as listed above. Psychiatric: Judgment intact, Mood & affect appropriate for pt's clinical situation. Dermatologic: Red rashes no ulcers noted.  No changes consistent with cellulitis. Lymph : + lichenification with skin changes of chronic lymphedema.  CBC Lab Results  Component Value Date   WBC 10.6 (H) 01/11/2019   HGB 9.8 (L) 01/11/2019   HCT 29.9 (L) 01/11/2019   MCV 99.0 01/11/2019   PLT 243 01/11/2019    BMET    Component Value Date/Time   NA 138 05/08/2020 1836   K 4.0 05/08/2020 1836   CL 104 05/08/2020 1836   CO2 25 05/08/2020 1836   GLUCOSE 127 (H) 05/08/2020 1836   BUN 20 05/08/2020 1836   CREATININE 0.92 05/08/2020 1836   CALCIUM 8.7 (L) 05/08/2020 1836   GFRNONAA >60 05/08/2020 1836   GFRAA >60 05/08/2020 1836   Estimated Creatinine Clearance: 50.1 mL/min (by C-G formula based on SCr of 0.92 mg/dL).  COAG Lab Results  Component Value Date   INR 1.1 (H) 11/26/2018    Radiology CT Angio Chest PE W and/or Wo Contrast  Result Date: 05/09/2020 CLINICAL DATA:  Left chest pain with elevated D-dimer EXAM: CT ANGIOGRAPHY CHEST CT ABDOMEN AND PELVIS WITH CONTRAST TECHNIQUE: Multidetector CT imaging of the chest was performed using the standard protocol during bolus administration of  intravenous contrast. Multiplanar CT image reconstructions and MIPs were obtained to evaluate the vascular anatomy. Multidetector CT imaging of the abdomen and pelvis was performed using the standard protocol during bolus administration of intravenous contrast. CONTRAST:  136mL OMNIPAQUE IOHEXOL 350 MG/ML SOLN COMPARISON:  None. FINDINGS: CTA CHEST FINDINGS Cardiovascular: Contrast injection is sufficient to demonstrate satisfactory opacification of the pulmonary arteries to the segmental level. There is no pulmonary embolus. The main pulmonary artery is within normal limits for size. There is no CT evidence of acute right heart strain. The visualized aorta is normal. There is a normal 3-vessel arch branching pattern. Heart size is normal, without pericardial effusion. Mediastinum/Nodes: No mediastinal, hilar or axillary lymphadenopathy. The visualized thyroid and thoracic esophageal course are unremarkable. Lungs/Pleura: No pulmonary nodules or masses. No pleural effusion or pneumothorax. No focal airspace consolidation. No focal pleural abnormality. Musculoskeletal: Multiple old left rib fractures. Review of the MIP images confirms the above findings. CT ABDOMEN and PELVIS FINDINGS Hepatobiliary: Normal hepatic contours and density. No visible biliary dilatation. Normal gallbladder. Pancreas: Normal contours without ductal dilatation. No peripancreatic fluid collection. Spleen: Normal. Adrenals/Urinary Tract: --Adrenal glands: Normal. --Right kidney/ureter: No hydronephrosis or perinephric stranding. No nephrolithiasis. No obstructing ureteral stones. --Left kidney/ureter: No hydronephrosis or perinephric stranding. No nephrolithiasis. No obstructing ureteral stones. --Urinary bladder: Unremarkable. Stomach/Bowel: --Stomach/Duodenum: No hiatal hernia or other gastric abnormality. Normal duodenal course and caliber. --Small bowel: No dilatation or inflammation. --Colon: No focal abnormality. --Appendix: Normal.  Vascular/Lymphatic: Normal course and caliber of the major abdominal vessels. No abdominal or pelvic lymphadenopathy. Reproductive: Status post hysterectomy. No adnexal mass. Musculoskeletal. Right total hip arthroplasty. There are chronic L1 and L4 compression fractures. Other: None. IMPRESSION: 1. No pulmonary embolus or acute aortic syndrome. 2. No acute abnormality of the chest, abdomen or pelvis. 3. Chronic L1 and L4 compression fractures. Electronically Signed   By: Ulyses Jarred M.D.   On: 05/09/2020 01:30   CT ABDOMEN PELVIS W CONTRAST  Result Date: 05/09/2020 CLINICAL DATA:  Left chest pain with elevated D-dimer EXAM: CT ANGIOGRAPHY CHEST CT ABDOMEN AND PELVIS WITH CONTRAST TECHNIQUE: Multidetector CT imaging of the chest was performed  using the standard protocol during bolus administration of intravenous contrast. Multiplanar CT image reconstructions and MIPs were obtained to evaluate the vascular anatomy. Multidetector CT imaging of the abdomen and pelvis was performed using the standard protocol during bolus administration of intravenous contrast. CONTRAST:  149mL OMNIPAQUE IOHEXOL 350 MG/ML SOLN COMPARISON:  None. FINDINGS: CTA CHEST FINDINGS Cardiovascular: Contrast injection is sufficient to demonstrate satisfactory opacification of the pulmonary arteries to the segmental level. There is no pulmonary embolus. The main pulmonary artery is within normal limits for size. There is no CT evidence of acute right heart strain. The visualized aorta is normal. There is a normal 3-vessel arch branching pattern. Heart size is normal, without pericardial effusion. Mediastinum/Nodes: No mediastinal, hilar or axillary lymphadenopathy. The visualized thyroid and thoracic esophageal course are unremarkable. Lungs/Pleura: No pulmonary nodules or masses. No pleural effusion or pneumothorax. No focal airspace consolidation. No focal pleural abnormality. Musculoskeletal: Multiple old left rib fractures. Review of the  MIP images confirms the above findings. CT ABDOMEN and PELVIS FINDINGS Hepatobiliary: Normal hepatic contours and density. No visible biliary dilatation. Normal gallbladder. Pancreas: Normal contours without ductal dilatation. No peripancreatic fluid collection. Spleen: Normal. Adrenals/Urinary Tract: --Adrenal glands: Normal. --Right kidney/ureter: No hydronephrosis or perinephric stranding. No nephrolithiasis. No obstructing ureteral stones. --Left kidney/ureter: No hydronephrosis or perinephric stranding. No nephrolithiasis. No obstructing ureteral stones. --Urinary bladder: Unremarkable. Stomach/Bowel: --Stomach/Duodenum: No hiatal hernia or other gastric abnormality. Normal duodenal course and caliber. --Small bowel: No dilatation or inflammation. --Colon: No focal abnormality. --Appendix: Normal. Vascular/Lymphatic: Normal course and caliber of the major abdominal vessels. No abdominal or pelvic lymphadenopathy. Reproductive: Status post hysterectomy. No adnexal mass. Musculoskeletal. Right total hip arthroplasty. There are chronic L1 and L4 compression fractures. Other: None. IMPRESSION: 1. No pulmonary embolus or acute aortic syndrome. 2. No acute abnormality of the chest, abdomen or pelvis. 3. Chronic L1 and L4 compression fractures. Electronically Signed   By: Ulyses Jarred M.D.   On: 05/09/2020 01:30     Assessment/Plan 1. Venous (peripheral) insufficiency Recommend:  Patient should undergo venous duplex of the lower extremity ASAP because there has been a significant deterioration in the patient's lower extremity symptoms.  The patient states they are having increased pain and a marked increase in her edema  The risks and benefits as well as the alternatives were discussed in detail with the patient.  All questions were answered.    The patient will follow up with me in the office to review the studies.  - VAS [place] LOWER EXTREMITY VENOUS (DVT); Future  2. Lymphedema See #1  3.  Osteoarthritis, unspecified osteoarthritis type, unspecified site Continue NSAID medications as already ordered, these medications have been reviewed and there are no changes at this time.  Continued activity and therapy was stressed.   4. Gastroesophageal reflux disease without esophagitis Continue PPI as already ordered, this medication has been reviewed and there are no changes at this time.  Avoidence of caffeine and alcohol  Moderate elevation of the head of the bed   5. Bilateral carotid artery stenosis Recommend:  Given the patient's asymptomatic subcritical stenosis no further invasive testing or surgery at this time.  Continue antiplatelet therapy as prescribed Continue management of CAD, HTN and Hyperlipidemia Healthy heart diet,  encouraged exercise at least 4 times per week Follow up in 12 months with duplex ultrasound and physical exam    Hortencia Pilar, MD  05/11/2020 1:53 PM

## 2020-05-11 NOTE — Patient Instructions (Addendum)
Please see [facility] V[facility] today and I would ask that a bilat lower extremity US is done for leg swelling left>right and positive D dimer with Neg Chest /abd findings. Need to r/o a blood clot in left leg.   For your left sided back pain- that is clearly musculoskeletal by exam today.  You have had a good  chest and abdomen evaluation with negative findings.   You reported back x-rays done at [facility] and we have requested records.    For now, I would take the meloxicam 7.5 mg one pill daily and take the  Vicodin pain medication as needed,and the muscle relaxer , apply Lidoderm patch as per directions, use intermittent heat, rest, and see [facility] to evaluate the thoracic spine.    Please make a f/up appt with Joycelyn Schmid in the next few days to follow.  Acute Back Pain, Adult Acute back pain is sudden and usually short-lived. It is often caused by an injury to the muscles and tissues in the back. The injury may result from:  A muscle or ligament getting overstretched or torn (strained). Ligaments are tissues that connect bones to each other. Lifting something improperly can cause a back strain.  Wear and tear (degeneration) of the spinal disks. Spinal disks are circular tissue that provides cushioning between the bones of the spine (vertebrae).  Twisting motions, such as while playing sports or doing yard work.  A hit to the back.  Arthritis. You may have a physical exam, lab tests, and imaging tests to find the cause of your pain. Acute back pain usually goes away with rest and home care. Follow these instructions at home: Managing pain, stiffness, and swelling  Take over-the-counter and prescription medicines only as told by your health care provider.  Your health care provider may recommend applying ice during the first 24-48 hours after your pain starts. To do this: ? Put ice in a plastic bag. ? Place a towel between your skin and the bag. ? Leave the ice on for 20  minutes, 2-3 times a day.  If directed, apply heat to the affected area as often as told by your health care provider. Use the heat source that your health care provider recommends, such as a moist heat pack or a heating pad. ? Place a towel between your skin and the heat source. ? Leave the heat on for 20-30 minutes. ? Remove the heat if your skin turns bright red. This is especially important if you are unable to feel pain, heat, or cold. You have a greater risk of getting burned. Activity   Do not stay in bed. Staying in bed for more than 1-2 days can delay your recovery.  Sit up and stand up straight. Avoid leaning forward when you sit, or hunching over when you stand. ? If you work at a desk, sit close to it so you do not need to lean over. Keep your chin tucked in. Keep your neck drawn back, and keep your elbows bent at a right angle. Your arms should look like the letter "L." ? Sit high and close to the steering wheel when you drive. Add lower back (lumbar) support to your car seat, if needed.  Take short walks on even surfaces as soon as you are able. Try to increase the length of time you walk each day.  Do not sit, drive, or stand in one place for more than 30 minutes at a time. Sitting or standing for long  periods of time can put stress on your back.  Do not drive or use heavy machinery while taking prescription pain medicine.  Use proper lifting techniques. When you bend and lift, use positions that put less stress on your back: ? [place] your knees. ? Keep the load close to your body. ? Avoid twisting.  Exercise regularly as told by your health care provider. Exercising helps your back heal faster and helps prevent back injuries by keeping muscles strong and flexible.  Work with a physical therapist to make a safe exercise program, as recommended by your health care provider. Do any exercises as told by your physical therapist. Lifestyle  Maintain a healthy weight. Extra  weight puts stress on your back and makes it difficult to have good posture.  Avoid activities or situations that make you feel anxious or stressed. Stress and anxiety increase muscle tension and can make back pain worse. Learn ways to manage anxiety and stress, such as through exercise. General instructions  Sleep on a firm mattress in a comfortable position. Try lying on your side with your knees slightly bent. If you lie on your back, put a pillow under your knees.  Follow your treatment plan as told by your health care provider. This may include: ? Cognitive or behavioral therapy. ? Acupuncture or massage therapy. ? Meditation or yoga. Contact a health care provider if:  You have pain that is not relieved with rest or medicine.  You have increasing pain going down into your legs or buttocks.  Your pain does not improve after 2 weeks.  You have pain at night.  You lose weight without trying.  You have a fever or chills. Get help right away if:  You develop new bowel or bladder control problems.  You have unusual weakness or numbness in your arms or legs.  You develop nausea or vomiting.  You develop abdominal pain.  You feel faint. Summary  Acute back pain is sudden and usually short-lived.  Use proper lifting techniques. When you bend and lift, use positions that put less stress on your back.  Take over-the-counter and prescription medicines and apply heat or ice as directed by your health care provider. This information is not intended to replace advice given to you by your health care provider. Make sure you discuss any questions you have with your health care provider. Document Revised: 01/22/2019 Document Reviewed: 05/17/2017 Elsevier Patient Education  [place].

## 2020-05-11 NOTE — Progress Notes (Signed)
Established Patient Office Visit  Subjective:  Patient ID: Claudia Andersen, female    DOB: 21-Jul-1941  Age: 79 y.o. MRN: [id number]  CC:  Chief Complaint  Patient presents with  . Pain    ongoing left sided pain. patient has no know injuries. Recently seen by [facility] walk in and [facility] hospital ED  79 y.o. 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  HPI Claudia Andersen is a 79 yo with history of remote DVT and PE on anticoagulation, arthritis, migraines, GERD, who  presents for persistent left sided and thoracic sharp pain. Onset Wed. She flew home on Tues eve and wore compression hose, first class and on plane x 1.5 hours. She felt a spasm pain- across her whole back and constant and spasm. It is worse with deep breathing and movement. She woke up on Wed and it was sore, not bad.  As the day wore on- it became worse. She went to [facility]- no Xray. Then, it became sharper over the next 2 days, and she went to [facility]. She had  labs and Xray and her D dimer was 2,868.  She was sent to the ED and had chest CT angio and CT and had found no positive findings to explain her symptoms. She was sent home with a Lidocaine patch, hydrocodone and muscle relaxer for MSK and has been taking these without relief.  She took one Mobic 7.5 mg . Not tried  Naproxen. Yesterday, she took  Vicodin yest am - no help only one. Mobic las night. No pain meds today and has 7 out of 10 pain left lateral pain under ribs to thoracic  back is tender with palpation and moving. No rash. No SOB, cough, fever. No cervical pain or upper extremity weakness or numbness.  Had both shingles vaccines.   Past Medical History:  Diagnosis Date  . Ankle fracture, bimalleolar, closed, right, initial encounter 10/26/2018   d/t fall 10/26/18  . Arthritis   . Back abrasion    CRUSHED  VERTEBRAE  . Closed right radial fracture 10/26/2018   s/p a fall  . Compression of lumbar vertebra ([place])    L1 and L4 (from fall November 05, 2018)  . Depression   . Gallstones   . GERD (gastroesophageal reflux disease)   . Migraines   . Pulmonary emboli (HCC)    history of emboli    Past Surgical History:  Procedure Laterality Date  . ABDOMINAL HYSTERECTOMY    . CATARACT EXTRACTION W/PHACO Right 03/27/2018   Procedure: CATARACT EXTRACTION PHACO AND INTRAOCULAR LENS PLACEMENT (IOC);  Surgeon: Birder Robson, MD;  Location: [facility] ORS;  Service: Ophthalmology;  Laterality: Right;  [place] 00:33 AP% 15.3 CDE 5.18 Fluid pack lot # [id number]  . CATARACT EXTRACTION W/PHACO Left 06/26/2018   Procedure: CATARACT EXTRACTION PHACO AND INTRAOCULAR LENS PLACEMENT ([place]);  Surgeon: Birder Robson, MD;  Location: [facility] ORS;  Service: Ophthalmology;  Laterality: Left;  [place] 00:38 AP% 15.2 CDE 5.80 Fluid pack lot # [id number]  . FRACTURE SURGERY     RIGHT RADIAL  . JOINT REPLACEMENT     LEFT TKR  . 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 TRIPLE SUBTALAR FUSION  Family History  Problem Relation Age of Onset  . Arthritis Mother   . Heart disease Mother   . Stroke Mother   . Arthritis Father   . Heart disease Father   . Stroke Father   . Breast cancer Neg Hx   . Colon cancer Neg Hx     Social History   Socioeconomic History  . Marital status: Divorced    Spouse name: Not on file  . Number of children: Not on file  . Years of education: Not on file  . Highest education level: Not on file  Occupational History  . Not on file  Tobacco Use  . Smoking status: Former Research scientist (life sciences)  . Smokeless tobacco: Never Used  Vaping Use  . Vaping Use: Never used  Substance and Sexual Activity  . Alcohol use: Yes    Comment: 5-7 glasses per week  .  Drug use: Not on file  . Sexual activity: Not on file  Other Topics Concern  . 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    Lives at [place] Strain:   . Difficulty of Paying Living Expenses:   Food Insecurity:   . Worried About Charity fundraiser in the Last Year:   . Arboriculturist in the Last Year:   Transportation Needs:   . Film/video editor (Medical):   Marland Kitchen Lack of Transportation (Non-Medical):   Physical Activity:   . Days of Exercise per Week:   . Minutes of Exercise per Session:   Stress:   . Feeling of Stress :   Social Connections:   . Frequency of Communication with Friends and Family:   . Frequency of Social Gatherings with Friends and Family:   . Attends Religious Services:   . Active Member of Clubs or Organizations:   . Attends Archivist Meetings:   Marland Kitchen Marital Status:   Intimate Partner Violence:   . Fear of Current or Ex-Partner:   . Emotionally Abused:   Marland Kitchen Physically Abused:   . Sexually Abused:     Outpatient Medications Prior to Visit  Medication Sig Dispense Refill  . acetaminophen (TYLENOL) 500 MG tablet Take 500 mg by mouth 2 (two) times daily as needed (PAIN.).    Marland Kitchen acetaminophen (TYLENOL) 500 MG tablet Take 2 tablets (1,000 mg total) by mouth every 8 (eight) hours as needed for mild pain, moderate pain, fever or headache. 50 tablet 0  . atorvastatin (LIPITOR) 10 MG tablet Take 1 tablet (10 mg total) by mouth daily. 90 tablet 0  . baclofen 5 MG TABS Take 5 mg by mouth 3 (three) times daily as needed for muscle spasms. 30 tablet 1  . Biotin 2500 MCG CAPS Take 2,500 mcg by mouth daily.    . Calcium Carb-Cholecalciferol (CALCIUM 600 + D PO) Take 2 tablets by mouth daily.     . cyclobenzaprine (FLEXERIL) 10 MG tablet Take 10 mg by mouth 3 (three) times daily as needed for muscle spasms.    . fluvoxaMINE (LUVOX) 100 MG tablet TAKE 1/2-1 TABLET BY  MOUTH DAILY (Patient not taking: Reported on 05/11/2020) 90 tablet 0  . furosemide (LASIX) 20 MG tablet Take 1 tablet (20 mg total) by mouth daily. 30 tablet 3  . gabapentin (NEURONTIN) 300 MG capsule Take 1 capsule (300 mg total) by mouth at bedtime. 90 capsule 3  .  hydrochlorothiazide (MICROZIDE) 12.5 MG capsule Take 12.5 mg by mouth daily.    Marland Kitchen HYDROcodone-acetaminophen (NORCO/VICODIN) 5-325 MG tablet Take by mouth.    . meloxicam (MOBIC) 7.5 MG tablet Take 1 tablet (7.5 mg total) by mouth daily as needed for pain. 30 tablet 1  . Multiple Vitamins-Iron (MULTIVITAMIN/IRON PO) Take by mouth daily.    . pantoprazole (PROTONIX) 40 MG tablet TAKE ONE TABLET BY MOUTH TWICE A DAY 180 tablet 2  . traZODone (DESYREL) 50 MG tablet Take 50-100 mg by mouth at bedtime as needed for sleep.     . valACYclovir (VALTREX) 1000 MG tablet Take two tablets ( total 2000 mg) by mouth q12h x 1 day; Start: ASAP after symptom onset 6 tablet 2  . potassium chloride (KLOR-CON) 10 MEQ tablet TAKE ONE TABLET BY MOUTH DAILY WHILE TAKING HCTZ (DIURETIC) 30 tablet 0  . Fluticasone Furoate (FLONASE SENSIMIST NA) Place 1 spray into both nostrils daily as needed (allergies). (Patient not taking: Reported on 05/11/2020)    . lidocaine (LIDODERM) 5 % Place 1 patch onto the skin every 12 (twelve) hours. Remove & Discard patch within 12 hours or as directed by MD 10 patch 0  . mupirocin ointment (BACTROBAN) 2 % Apply 1 application topically 2 (two) times daily. (Patient not taking: Reported on 05/11/2020) 22 g 0  . traMADol (ULTRAM) 50 MG tablet Take 1 tablet (50 mg total) by mouth every 6 (six) hours as needed. (Patient not taking: Reported on 05/11/2020) 12 tablet 0  . triamcinolone (KENALOG) 0.025 % cream Apply 1 application topically 2 (two) times daily. (Patient not taking: Reported on 05/11/2020) 15 g 1  . naproxen (NAPROSYN) 375 MG tablet naproxen 375 mg tablet (Patient not taking: Reported on 05/11/2020)    . predniSONE (DELTASONE)  10 MG tablet Take 40 mg by mouth on day 1, then taper 10 mg daily until gone 10 tablet 0   No facility-administered medications prior to visit.    No Known Allergies  Review of Systems Pertinent positives none history of present illness otherwise diet.   Objective:    Physical Exam Vitals reviewed.  Constitutional:      Appearance: She is obese.  HENT:     Head: Normocephalic and atraumatic.  Cardiovascular:     Rate and Rhythm: Normal rate and regular rhythm.     Heart sounds: Normal heart sounds.  Pulmonary:     Effort: Pulmonary effort is normal.     Breath sounds: Normal breath sounds.  Abdominal:     Palpations: Abdomen is soft.     Tenderness: There is no abdominal tenderness.  Musculoskeletal:        General: Tenderness present.     Cervical back: Normal range of motion and neck supple.     Right lower leg: Edema present.     Left lower leg: Edema present.     Comments: Left lateral side pain just under T12 that wraps around thoracic back in same line. T spine is tender to spine palpation. Suspect compression fracture. Pain is reproduced with  twisting motion.   Bilat lower ext edema- left leg > size than right.  No calf pain or tightness.   Skin:    General: Skin is warm and dry.  Neurological:     General: No focal deficit present.     Mental Status: She is alert and oriented to person, place, and time.  Psychiatric:        Mood and Affect: Mood normal.  Behavior: Behavior normal.     BP (!) 150/86   Pulse 74   Temp 97.9 F (36.6 C) (Oral)   Ht 5\' 2"  (1.575 m)   Wt 177 lb 6.4 oz (80.5 kg)   SpO2 97%   BMI 32.45 kg/m  Wt Readings from Last 3 Encounters:  05/11/20 176 lb (79.8 kg)  05/11/20 177 lb 6.4 oz (80.5 kg)  05/08/20 160 lb (72.6 kg)     Health Maintenance Due  Topic Date Due  . Hepatitis C Screening  Never done  . TETANUS/TDAP  12/03/2017    There are no preventive care reminders to display for this patient.    Assessment &  Plan:   Problem List Items Addressed This Visit      Other   Lymphedema   Acute left-sided thoracic back pain - Primary   Relevant Medications   HYDROcodone-acetaminophen (NORCO/VICODIN) 5-325 MG tablet   Positive D dimer      Meds ordered this encounter  Medications  . potassium chloride (KLOR-CON) 10 MEQ tablet    Sig: TAKE ONE TABLET BY MOUTH DAILY WHILE TAKING HCTZ (DIURETIC)    Dispense:  30 tablet    Refill:  0    Order Specific Question:   Supervising Provider    Answer:   Einar Pheasant [[id number]]   Please see Vein [facility] Vascular today and I would ask that a bilat lower extremity US is done for leg swelling left>right and positive D dimer with Neg Chest /abd findings. Need to r/o a blood clot in left leg.   For your left sided back pain- that is clearly musculoskeletal by exam today.  You have had a good  chest and abdomen evaluation with negative findings.   You reported back x-rays done at [facility] clinic and we have requested records.    For now, I would take the meloxicam 7.5 mg one pill daily and take the  Vicodin pain medication as needed,and the muscle relaxer , apply Lidoderm patch as per directions, use intermittent heat, rest, and see [facility] to evaluate the thoracic spine.    Please make a f/up appt with Joycelyn Schmid in the next few days to follow.  Follow-up: Return in about 2 days (around 05/13/2020).   This visit occurred during the SARS-CoV-2 public health emergency.  Safety protocols were in place, including screening questions prior to the visit, additional usage of staff PPE, and extensive cleaning of exam room while observing appropriate contact time as indicated for disinfecting solutions.   Denice Paradise, NP

## 2020-05-12 ENCOUNTER — Ambulatory Visit (INDEPENDENT_AMBULATORY_CARE_PROVIDER_SITE_OTHER): Payer: Medicare PPO

## 2020-05-12 ENCOUNTER — Encounter (INDEPENDENT_AMBULATORY_CARE_PROVIDER_SITE_OTHER): Payer: Self-pay | Admitting: Vascular Surgery

## 2020-05-12 DIAGNOSIS — M7989 Other specified soft tissue disorders: Secondary | ICD-10-CM

## 2020-05-13 ENCOUNTER — Telehealth: Payer: Self-pay | Admitting: Nurse Practitioner

## 2020-05-13 NOTE — Telephone Encounter (Signed)
Please call Claudia Andersen for update on her left lower extremity edema and left back pain.   1.  She saw  vein and vascular and did she have an ultrasound done?   2. She was also going to see [facility]. How is her pain doing and how is her BP?   3. Please keep f/up with margaret as planned 05/19/20. Thnx

## 2020-05-13 NOTE — Telephone Encounter (Signed)
Patient did she vein and vascular and had US done which showed nothing; no blood clots and doctor said this is just edema. She did see [facility] and they did an xray that showed she has a broken rib. He BP has been elevated in the 150s. She plans on keeping appt with Joycelyn Schmid as planned.

## 2020-05-18 ENCOUNTER — Telehealth: Payer: Self-pay | Admitting: Nurse Practitioner

## 2020-05-18 NOTE — Telephone Encounter (Signed)
Records received from [facility] thoracic Xray and are not clear enough to read. Please ask again for the records.

## 2020-05-18 NOTE — Telephone Encounter (Signed)
Called and left a message with [facility] to see if they can refax order to [place]

## 2020-05-19 ENCOUNTER — Encounter: Payer: Self-pay | Admitting: Family

## 2020-05-19 ENCOUNTER — Ambulatory Visit: Payer: Medicare PPO | Admitting: Family

## 2020-05-19 ENCOUNTER — Other Ambulatory Visit: Payer: Self-pay

## 2020-05-19 VITALS — BP 132/82 | HR 79 | Temp 98.1°F | Resp 16 | Ht 62.0 in | Wt 171.6 lb

## 2020-05-19 DIAGNOSIS — M546 Pain in thoracic spine: Secondary | ICD-10-CM | POA: Diagnosis not present

## 2020-05-19 DIAGNOSIS — R600 Localized edema: Secondary | ICD-10-CM | POA: Diagnosis not present

## 2020-05-19 MED ORDER — POTASSIUM CHLORIDE ER 10 MEQ PO TBCR: EXTENDED_RELEASE_TABLET | ORAL | 0 refills | Status: DC

## 2020-05-19 MED ORDER — FUROSEMIDE 20 MG PO TABS: 20.0000 mg | ORAL_TABLET | Freq: Every day | ORAL | 3 refills | Status: DC | PRN

## 2020-05-19 NOTE — Assessment & Plan Note (Signed)
Resolved at this time. She will keep lasix, hctz, and kcl at home IF needed. She understands she may trial either hctz OR lasix for leg swelling prn if needed and taken with Kcl.

## 2020-05-19 NOTE — Assessment & Plan Note (Addendum)
Acute. New.  X-ray from her to clinic raise concern for thoracic compression fractures as well as history (osteoporosis, on Prolia) and presentation.  Pending MRI thoracic ( patient declines having done today) and consult with Dr Sharyne Richters after results received.  Patient politely declined trialing other medications or changing medications today in regards to pain management.   We had a long discussion regards to elevated blood pressure today and suspect that discomfort, as well as the meloxicam likely contributory.  Advised patient keep blood pressure log at home. If may be that she needs to start HCTZ 12.5mg  QD for blood pressure if continues to be elevated.  Blood pressure at her last visit with me in June, was very well controlled so we jointly agreed hesitant to start hydrochlorothiazide on a daily basis at this time.  However it may be that we do this in the near future.  Close follow-up

## 2020-05-19 NOTE — Progress Notes (Signed)
Subjective:    Patient ID: Claudia Andersen, female    DOB: 1941-09-01, 79 y.o.   MRN: [id number]  CC: Claudia Andersen is a 79 y.o. female who presents today for follow up.   HPI: Middle back pain across bra line x 2 weeks ago, sudden onset.   Had flight to [place] at that time. Started left side and now has moved to right side. Past few days, has moved to right side as well.Twisting is most aggravating for pain. Notes that at times, pain can be right over her 'vertebrae'.   No associated fever, sob, anterior chest pain, exertional chest pain,  problems urinating or having bowel movement.No numbness, weakness. No falls. No rash. NO a/w eating.    Tried flexeril, mobic, vicodin, mobic without relief. Taking baclofen, mobic daily without any relief.   Saw [facility] last week.    Neck pain has resolved.  Bilateral US last week in Dr Curley Spice which was negative per patient ( unable to see in chart)  CAD- compliant with asa 81mg   Leg swelling- no longer on lasix, Kcl, HCTZ.   Doesn't take any medications for blood pressure     Following with endocrine, for osteoporosis ; on prolia  Was seen by vascular 05/11/2020 for leg swelling, Dr. Ronalee Belts; advised venous duplex ASAP; bilateral carotid arterial stenosis-continue antiplatelet  [ she is on asa 81mg ]  Dr Ubaldo Glassing 03/10/20  She went to [facility] urgent care 05/08/20 where rays were done, D-dimer elevated.  She was sent emergency room with CT angiogram CT negative for pulmonary embolism. Multiple old rib fractures. Right total hip arthroplasty, chronic L1 and L4 compression fracture. Crt 0.92 05/08/20   Thoracic spine x-ray done on 05/08/2020 with Jefm Bryant shows old L1 fracture with similar findings on prior exam.  Also shows thoracic spine scoliosis concave left.  Multiple new upper and mid thoracic vertebral body compression fractures cannot be excluded.  Cardiomegaly  HISTORY:  Past Medical History:  Diagnosis Date  . Ankle fracture,  bimalleolar, closed, right, initial encounter 10/26/2018   d/t fall 10/26/18  . Arthritis   . Back abrasion    CRUSHED VERTEBRAE  . Closed right radial fracture 10/26/2018   s/p a fall  . Compression of lumbar vertebra ([place])    L1 and L4 (from fall November 05, 2018)  . Depression   . Gallstones   . GERD (gastroesophageal reflux disease)   . Migraines   . Pulmonary emboli (HCC)    history of emboli   Past Surgical History:  Procedure Laterality Date  . ABDOMINAL HYSTERECTOMY    . CATARACT EXTRACTION W/PHACO Right 03/27/2018   Procedure: CATARACT EXTRACTION PHACO AND INTRAOCULAR LENS PLACEMENT ([facility]);  Surgeon: Birder Robson, MD;  Location: [facility] ORS;  Service: Ophthalmology;  Laterality: Right;  [place] 00:33 AP% 15.3 CDE 5.18 Fluid pack lot # [id number]  . CATARACT EXTRACTION W/PHACO Left 06/26/2018   Procedure: CATARACT EXTRACTION PHACO AND INTRAOCULAR LENS PLACEMENT ([place]);  Surgeon: Birder Robson, MD;  Location: [facility] ORS;  Service: Ophthalmology;  Laterality: Left;  [place] 00:38 AP% 15.2 CDE 5.80 Fluid pack lot # [id number]  . FRACTURE SURGERY     RIGHT RADIAL  . JOINT REPLACEMENT     LEFT TKR  . 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 TRIPLE SUBTALAR FUSION     Family History  Problem Relation Age  of Onset  . Arthritis Mother   . Heart disease Mother   . Stroke Mother   . Arthritis Father   . Heart disease Father   . Stroke Father   . Breast cancer Neg Hx   . Colon cancer Neg Hx     Allergies: Patient has no known allergies. Current Outpatient Medications on File Prior to Visit  Medication Sig Dispense Refill  . acetaminophen (TYLENOL) 500 MG tablet Take 2 tablets (1,000 mg total) by mouth every 8 (eight) hours as needed for mild pain, moderate pain, fever or headache. 50 tablet 0  . aspirin 81 MG chewable tablet Chew by  mouth daily.    Marland Kitchen atorvastatin (LIPITOR) 10 MG tablet Take 1 tablet (10 mg total) by mouth daily. 90 tablet 0  . baclofen 5 MG TABS Take 5 mg by mouth 3 (three) times daily as needed for muscle spasms. 30 tablet 1  . Biotin 2500 MCG CAPS Take 2,500 mcg by mouth daily.    . Calcium Carb-Cholecalciferol (CALCIUM 600 + D PO) Take 2 tablets by mouth daily.     . Fluticasone Furoate (FLONASE SENSIMIST NA) Place 1 spray into both nostrils daily as needed (allergies).     . fluvoxaMINE (LUVOX) 100 MG tablet TAKE 1/2-1 TABLET BY MOUTH DAILY 90 tablet 0  . gabapentin (NEURONTIN) 300 MG capsule Take 1 capsule (300 mg total) by mouth at bedtime. 90 capsule 3  . hydrochlorothiazide (MICROZIDE) 12.5 MG capsule Take 12.5 mg by mouth daily.    Marland Kitchen HYDROcodone-acetaminophen (NORCO/VICODIN) 5-325 MG tablet Take by mouth.    . lidocaine (LIDODERM) 5 % Place 1 patch onto the skin every 12 (twelve) hours. Remove & Discard patch within 12 hours or as directed by MD 10 patch 0  . meloxicam (MOBIC) 7.5 MG tablet Take 1 tablet (7.5 mg total) by mouth daily as needed for pain. 30 tablet 1  . Multiple Vitamins-Iron (MULTIVITAMIN/IRON PO) Take by mouth daily.    . mupirocin ointment (BACTROBAN) 2 % Apply 1 application topically 2 (two) times daily. 22 g 0  . pantoprazole (PROTONIX) 40 MG tablet TAKE ONE TABLET BY MOUTH TWICE A DAY 180 tablet 2  . traMADol (ULTRAM) 50 MG tablet Take 1 tablet (50 mg total) by mouth every 6 (six) hours as needed. 12 tablet 0  . traZODone (DESYREL) 50 MG tablet Take 50-100 mg by mouth at bedtime as needed for sleep.     Marland Kitchen triamcinolone (KENALOG) 0.025 % cream Apply 1 application topically 2 (two) times daily. 15 g 1  . valACYclovir (VALTREX) 1000 MG tablet Take two tablets ( total 2000 mg) by mouth q12h x 1 day; Start: ASAP after symptom onset 6 tablet 2   No current facility-administered medications on file prior to visit.    Social History   Tobacco Use  . Smoking status: Former Research scientist (life sciences)   . Smokeless tobacco: Never Used  Vaping Use  . Vaping Use: Never used  Substance Use Topics  . Alcohol use: Yes    Comment: 5-7 glasses per week  . Drug use: Not on file    Review of Systems  Constitutional: Negative for chills and fever.  Respiratory: Negative for cough and shortness of breath.   Cardiovascular: Negative for chest pain, palpitations and leg swelling.  Gastrointestinal: Negative for abdominal pain, constipation, nausea and vomiting.  Genitourinary: Negative for difficulty urinating.  Musculoskeletal: Positive for back pain.  Skin: Negative for rash.  Neurological: Negative for weakness and numbness.  Objective:    BP 132/82   Pulse 79   Temp 98.1 F (36.7 C) (Oral)   Resp 16   Ht 5\' 2"  (1.575 m)   Wt 171 lb 9.6 oz (77.8 kg)   SpO2 96%   BMI 31.39 kg/m  BP Readings from Last 3 Encounters:  05/19/20 132/82  05/11/20 (!) 141/81  05/11/20 (!) 150/86   Wt Readings from Last 3 Encounters:  05/19/20 171 lb 9.6 oz (77.8 kg)  05/11/20 176 lb (79.8 kg)  05/11/20 177 lb 6.4 oz (80.5 kg)    Physical Exam Vitals reviewed.  Constitutional:      Appearance: She is well-developed.  Eyes:     Conjunctiva/sclera: Conjunctivae normal.  Cardiovascular:     Rate and Rhythm: Normal rate and regular rhythm.     Pulses: Normal pulses.     Heart sounds: Normal heart sounds.  Pulmonary:     Effort: Pulmonary effort is normal.     Breath sounds: Normal breath sounds. No wheezing, rhonchi or rales.  Musculoskeletal:     Thoracic back: Tenderness and bony tenderness (over vertebra) present. No swelling or edema. Normal range of motion.  Skin:    General: Skin is warm and dry.  Neurological:     Mental Status: She is alert.  Psychiatric:        Speech: Speech normal.        Behavior: Behavior normal.        Thought Content: Thought content normal.        Assessment & Plan:   Problem List Items Addressed This Visit      Other   Leg edema    Resolved  at this time. She will keep lasix, hctz, and kcl at home IF needed. She understands she may trial either hctz OR lasix for leg swelling prn if needed and taken with Kcl.       Relevant Medications   furosemide (LASIX) 20 MG tablet   potassium chloride (KLOR-CON) 10 MEQ tablet   Thoracic back pain - Primary    Acute. New.  X-ray from her to clinic raise concern for thoracic compression fractures as well as history (osteoporosis, on Prolia) and presentation.  Pending MRI thoracic ( patient declines having done today) and consult with Dr Sharyne Richters after results received.  Patient politely declined trialing other medications or changing medications today in regards to pain management.   We had a long discussion regards to elevated blood pressure today and suspect that discomfort, as well as the meloxicam likely contributory.  Advised patient keep blood pressure log at home. If may be that she needs to start HCTZ 12.5mg  QD for blood pressure if continues to be elevated.  Blood pressure at her last visit with me in June, was very well controlled so we jointly agreed hesitant to start hydrochlorothiazide on a daily basis at this time.  However it may be that we do this in the near future.  Close follow-up      Relevant Medications   aspirin 81 MG chewable tablet   Other Relevant Orders   MR Thoracic Spine Wo Contrast       I have discontinued [name] "Sandy"'s cyclobenzaprine. I have also changed her furosemide and potassium chloride. Additionally, I am having her maintain her traZODone, Calcium Carb-Cholecalciferol (CALCIUM 600 + D PO), Fluticasone Furoate (FLONASE SENSIMIST NA), Biotin, mupirocin ointment, pantoprazole, gabapentin, fluvoxaMINE, atorvastatin, triamcinolone, valACYclovir, meloxicam, Baclofen, lidocaine, acetaminophen, traMADol, HYDROcodone-acetaminophen, hydrochlorothiazide, Multiple Vitamins-Iron (MULTIVITAMIN/IRON PO), and  aspirin.   Meds ordered this encounter  Medications   . furosemide (LASIX) 20 MG tablet    Sig: Take 1 tablet (20 mg total) by mouth daily as needed.    Dispense:  30 tablet    Refill:  3    Order Specific Question:   Supervising Provider    Answer:   Deborra Medina L [[id number]]  . potassium chloride (KLOR-CON) 10 MEQ tablet    Sig: TAKE ONE TABLET BY MOUTH DAILY WHILE TAKING LASIX (DIURETIC)    Dispense:  30 tablet    Refill:  0    Order Specific Question:   Supervising Provider    Answer:   Crecencio Mc [[id number]]    Return precautions given.   Risks, benefits, and alternatives of the medications and treatment plan prescribed today were discussed, and patient expressed understanding.   Education regarding symptom management and diagnosis given to patient on AVS.  Continue to follow with Burnard Hawthorne, FNP for routine health maintenance.   [name] and I agreed with plan.   Mable Paris, FNP

## 2020-05-19 NOTE — Patient Instructions (Addendum)
I suspect pain, meloxicam is elevating your blood pressure   monitor blood pressure at home and me 5-6 reading on separate days. Goal is less than 120/80, based on newest guidelines, however we certainly want to be less than 130/80;  if persistently higher, please make sooner follow up appointment so we can recheck you blood pressure and manage/ adjust medications.   MRI order thoracic spine  Let us know if you dont hear back within a week in regards to an appointment being scheduled.

## 2020-05-20 ENCOUNTER — Telehealth: Payer: Self-pay | Admitting: Family

## 2020-05-20 NOTE — Telephone Encounter (Signed)
Called in wanted to be contacted on this number [phone]

## 2020-05-21 NOTE — Telephone Encounter (Signed)
Pt states that she needs MRI as soon as possible. She would like a call back. She said she is in a lot of pain and she is willing to go [place] she just needs to get in soon. She said Joycelyn Schmid was supposed to make the order urgent. When I looked it said routine. I let her know that I will give you the message to call her back.

## 2020-05-22 ENCOUNTER — Other Ambulatory Visit: Payer: Self-pay | Admitting: Family

## 2020-05-22 DIAGNOSIS — M5137 Other intervertebral disc degeneration, lumbosacral region: Secondary | ICD-10-CM

## 2020-05-22 NOTE — Telephone Encounter (Signed)
pt called back checking on sooner MRI appt

## 2020-05-25 ENCOUNTER — Ambulatory Visit
Admission: RE | Admit: 2020-05-25 | Discharge: 2020-05-25 | Disposition: A | Discharge status: Home / Self Care | Payer: Medicare PPO | Source: Ambulatory Visit | Attending: Family | Admitting: Family

## 2020-05-25 ENCOUNTER — Telehealth: Payer: Self-pay | Admitting: Family

## 2020-05-25 ENCOUNTER — Other Ambulatory Visit: Payer: Self-pay

## 2020-05-25 DIAGNOSIS — M5137 Other intervertebral disc degeneration, lumbosacral region: Secondary | ICD-10-CM | POA: Diagnosis not present

## 2020-05-25 NOTE — Telephone Encounter (Signed)
Call Dr Marcello Moores Dimmig's nurse and ask if he can see mutual patient's thoracic mri- [phone]   Notably - Inferior endplate compression fracture of T8 ; there is marrow edema within the vertebral body consistent with acute or early subacute injury  Can he see patient asap for her back pain, compression fractures? Can we schedule her an appointment with him?   We can fax report as well if they cannot see Epic.   What is fax number if he cannot see?

## 2020-05-26 NOTE — Telephone Encounter (Signed)
Called the office of Dr Marcello Moores Dimmig and spoke to [place]. She states that she was unable to schedule a visit over the phone with me and asks if the patient could call and schedule the appointment. She also states that the office does not use epic and does not know if Dr. Sharyne Richters can see the imaging inside of Epic. Holley Dexter states that she would need the imaging printed on a Disk and then given to them.

## 2020-05-26 NOTE — Telephone Encounter (Signed)
THANK YOU.  Well done and excellent communication Im so glad she is seeing [facility] today

## 2020-05-26 NOTE — Telephone Encounter (Signed)
[place]. Claudia Andersen states that she has already spoke to Dr. Versie Starks office and has already scheduled an appointment. She has already called the Imaging center and is having a friend pick up her Images to bring with her to her appointment. She is seeing Dr. Versie Starks PA on the evening of 05/26/20.

## 2020-06-03 ENCOUNTER — Encounter: Payer: Self-pay | Admitting: Family

## 2020-06-03 ENCOUNTER — Other Ambulatory Visit: Payer: Self-pay

## 2020-06-03 ENCOUNTER — Ambulatory Visit: Payer: Medicare PPO | Admitting: Family

## 2020-06-03 VITALS — BP 130/78 | HR 72 | Temp 97.6°F | Ht 62.01 in | Wt 177.0 lb

## 2020-06-03 DIAGNOSIS — M542 Cervicalgia: Secondary | ICD-10-CM

## 2020-06-03 DIAGNOSIS — R600 Localized edema: Secondary | ICD-10-CM | POA: Diagnosis not present

## 2020-06-03 DIAGNOSIS — F325 Major depressive disorder, single episode, in full remission: Secondary | ICD-10-CM

## 2020-06-03 DIAGNOSIS — B001 Herpesviral vesicular dermatitis: Secondary | ICD-10-CM

## 2020-06-03 DIAGNOSIS — M546 Pain in thoracic spine: Secondary | ICD-10-CM | POA: Diagnosis not present

## 2020-06-03 DIAGNOSIS — G8929 Other chronic pain: Secondary | ICD-10-CM

## 2020-06-03 MED ORDER — VALACYCLOVIR HCL 1 G PO TABS: ORAL_TABLET | ORAL | 2 refills | Status: DC

## 2020-06-03 MED ORDER — DICLOFENAC SODIUM 1 % EX GEL: 4.0000 g | Freq: Four times a day (QID) | CUTANEOUS | 1 refills | Status: AC

## 2020-06-03 MED ORDER — BACLOFEN 5 MG PO TABS: 5.0000 mg | ORAL_TABLET | Freq: Three times a day (TID) | ORAL | 1 refills | Status: DC | PRN

## 2020-06-03 MED ORDER — ATORVASTATIN CALCIUM 10 MG PO TABS: 10.0000 mg | ORAL_TABLET | Freq: Every day | ORAL | 0 refills | Status: DC

## 2020-06-03 MED ORDER — FLUVOXAMINE MALEATE 100 MG PO TABS: ORAL_TABLET | ORAL | 0 refills | Status: DC

## 2020-06-03 NOTE — Patient Instructions (Addendum)
For blood pressure: Take 12.5mg  HCTZ DAILY to maintain blood pressure ( goal < 130/80) AND leg swelling  Think about lasix as needed , 1-2 twice per week, with potassium when you take it. You may need lasix even less while on the HCTZ daily.  Increase baclofen to 5mg  three times per day. Please let me know if you feel lightheaded, weak , excessive sedation on this regimen  We can consider gabapentin 100mg  morning and at lunch to compliment your gabapentin 300mg  at bedtime  We can also consider weaning off luvox and starting cymbalta which we use for depression and chronic pain.  Let me know how it goes with Dr Sharyne Richters and if you would like referral to pain management

## 2020-06-03 NOTE — Assessment & Plan Note (Signed)
Chronic.  For some reason patient had come off hydrochlorothiazide.  Advised her to resume a low dose.  Patient advised that she may use Lasix if and only if swelling were exacerbated and she may use this once or twice per week with potassium chloride.  Patient verbalized understanding of all.

## 2020-06-03 NOTE — Progress Notes (Signed)
Subjective:    Patient ID: Claudia Andersen, female    DOB: 1941/10/15, 79 y.o.   MRN: [id number]  CC: Claudia Andersen is a 79 y.o. female who presents today for follow up.   HPI:  Follow-up after seeing [facility] after thoracic MRI showed concern for compression fracture .  Complains of continued diddle back pain , left and right sided , unchanged. She describes pain as wrapping around her torso bilaterally and feeling like a 'muscle spasm'.  She has follow-up with Dr. Joselyn Arrow tomorrow which she plans to discuss kyphoplasty  Taking hydrocodone TID and baclofen half tabletTID with temporary relief. She is adamant that she is not experiencing excessive sedation, constipation. She had not any recent falls.  One glass of wine at night.  Not taking tramadol. Taking mobic 7.5mg  qd with food. Using lidocaine patch with some relief.   No numbness in legs, trouble urinating or having a bowel movement, abdominal pain, dysuria  Chronic bilateral eg swelling- last dose of lasix 3 days ago.   HTN- has not been taking hctz.   No cp, sob   HISTORY:  Past Medical History:  Diagnosis Date  . Ankle fracture, bimalleolar, closed, right, initial encounter 10/26/2018   d/t fall 10/26/18  . Arthritis   . Back abrasion    CRUSHED VERTEBRAE  . Closed right radial fracture 10/26/2018   s/p a fall  . 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 Depression   . Gallstones   . GERD (gastroesophageal reflux disease)   . Migraines   . Pulmonary emboli (HCC)    history of emboli   Past Surgical History:  Procedure Laterality Date  . ABDOMINAL HYSTERECTOMY    . CATARACT EXTRACTION W/PHACO Right 03/27/2018   Procedure: CATARACT EXTRACTION PHACO AND INTRAOCULAR LENS PLACEMENT (IOC);  Surgeon: Birder Robson, MD;  Location: [facility];  Service: Ophthalmology;  Laterality: Right;  [place] 00:33 AP% 15.3 CDE 5.18 Fluid pack lot # [id number]  . CATARACT EXTRACTION W/PHACO  Left 06/26/2018   Procedure: CATARACT EXTRACTION PHACO AND INTRAOCULAR LENS PLACEMENT ([place]);  Surgeon: Birder Robson, MD;  Location: [facility];  Service: Ophthalmology;  Laterality: Left;  [place] 00:38 AP% 15.2 CDE 5.80 Fluid pack lot # [id number]  . FRACTURE SURGERY     RIGHT RADIAL  . JOINT REPLACEMENT     LEFT TKR  . 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 TRIPLE SUBTALAR FUSION     Family History  Problem Relation Age of Onset  . Arthritis Mother   . Heart disease Mother   . Stroke Mother   . Arthritis Father   . Heart disease Father   . Stroke Father   . Breast cancer Neg Hx   . Colon cancer Neg Hx     Allergies: Patient has no known allergies. Current Outpatient Medications on File Prior to Visit  Medication Sig Dispense Refill  . acetaminophen (TYLENOL) 500 MG tablet Take 2 tablets (1,000 mg total) by mouth every 8 (eight) hours as needed for mild pain, moderate pain, fever or headache. 50 tablet 0  . aspirin 81 MG chewable tablet Chew by mouth daily.    . baclofen (LIORESAL) 10 MG tablet Take 5 mg by mouth 3 (three) times daily.    . Biotin 2500 MCG CAPS Take  2,500 mcg by mouth daily.    . Calcium Carb-Cholecalciferol (CALCIUM 600 + D PO) Take 2 tablets by mouth daily.     . Fluticasone Furoate (FLONASE SENSIMIST NA) Place 1 spray into both nostrils daily as needed (allergies).     . furosemide (LASIX) 20 MG tablet Take 1 tablet (20 mg total) by mouth daily as needed. 30 tablet 3  . gabapentin (NEURONTIN) 300 MG capsule Take 1 capsule (300 mg total) by mouth at bedtime. 90 capsule 3  . hydrochlorothiazide (MICROZIDE) 12.5 MG capsule Take 12.5 mg by mouth daily.    Marland Kitchen HYDROcodone-acetaminophen (NORCO/VICODIN) 5-325 MG tablet Take by mouth.    . lidocaine (LIDODERM) 5 % Place 1 patch onto the skin every 12 (twelve) hours. Remove  & Discard patch within 12 hours or as directed by MD 10 patch 0  . meloxicam (MOBIC) 7.5 MG tablet Take 1 tablet (7.5 mg total) by mouth daily as needed for pain. 30 tablet 1  . Multiple Vitamins-Iron (MULTIVITAMIN/IRON PO) Take by mouth daily.    . mupirocin ointment (BACTROBAN) 2 % Apply 1 application topically 2 (two) times daily. 22 g 0  . pantoprazole (PROTONIX) 40 MG tablet TAKE ONE TABLET BY MOUTH TWICE A DAY 180 tablet 2  . potassium chloride (KLOR-CON) 10 MEQ tablet TAKE ONE TABLET BY MOUTH DAILY WHILE TAKING LASIX (DIURETIC) 30 tablet 0  . PREVIDENT 5000 PLUS 1.1 % CREA dental cream Take by mouth.    . traZODone (DESYREL) 50 MG tablet Take 50-100 mg by mouth at bedtime as needed for sleep.     Marland Kitchen triamcinolone (KENALOG) 0.025 % cream Apply 1 application topically 2 (two) times daily. 15 g 1   No current facility-administered medications on file prior to visit.    Social History   Tobacco Use  . Smoking status: Former Research scientist (life sciences)  . Smokeless tobacco: Never Used  Vaping Use  . Vaping Use: Never used  Substance Use Topics  . Alcohol use: Yes    Comment: 5-7 glasses per week  . Drug use: Not on file    Review of Systems  Constitutional: Negative for chills and fever.  Respiratory: Negative for cough.   Cardiovascular: Negative for chest pain, palpitations and leg swelling.  Gastrointestinal: Negative for abdominal pain, constipation, nausea and vomiting.  Genitourinary: Negative for decreased urine volume.  Musculoskeletal: Positive for back pain.  Neurological: Negative for numbness and headaches.      Objective:    BP 130/78   Pulse 72   Temp 97.6 F (36.4 C)   Ht 5' 2.01" (1.575 m)   Wt 177 lb (80.3 kg)   SpO2 97%   BMI 32.37 kg/m  BP Readings from Last 3 Encounters:  06/03/20 130/78  05/19/20 132/82  05/11/20 (!) 141/81   Wt Readings from Last 3 Encounters:  06/03/20 177 lb (80.3 kg)  05/19/20 171 lb 9.6 oz (77.8 kg)  05/11/20 176 lb (79.8 kg)     Physical Exam Vitals reviewed.  Constitutional:      Appearance: She is well-developed.  Eyes:     Conjunctiva/sclera: Conjunctivae normal.  Cardiovascular:     Rate and Rhythm: Normal rate and regular rhythm.     Pulses: Normal pulses.     Heart sounds: Normal heart sounds.     Comments: Nonpitting +1 bilateral edema appreciated at ankle.  No palpable cords or masses. No erythema or increased warmth. No asymmetry in calf size when compared bilaterally LE hair growth symmetric and present.  No discoloration or varicosities noted. LE warm and palpable pedal pulses.  Pulmonary:     Effort: Pulmonary effort is normal.     Breath sounds: Normal breath sounds. No wheezing, rhonchi or rales.  Musculoskeletal:     Right lower leg: 1+ Edema present.     Left lower leg: 1+ Edema present.  Skin:    General: Skin is warm and dry.  Neurological:     Mental Status: She is alert.  Psychiatric:        Speech: Speech normal.        Behavior: Behavior normal.        Thought Content: Thought content normal.        Assessment & Plan:   Problem List Items Addressed This Visit      Digestive   Herpes labialis   Relevant Medications   valACYclovir (VALTREX) 1000 MG tablet     Other   Depression, major, single episode, complete remission (HCC)   Relevant Medications   fluvoxaMINE (LUVOX) 100 MG tablet   Leg edema    Chronic.  For some reason patient had come off hydrochlorothiazide.  Advised her to resume a low dose.  Patient advised that she may use Lasix if and only if swelling were exacerbated and she may use this once or twice per week with potassium chloride.  Patient verbalized understanding of all.      Neck pain   Relevant Medications   Baclofen 5 MG TABS   Thoracic back pain - Primary    Chronic, unchanged.  She has appt  with Dr. Joselyn Arrow tomorrow, will follow.   She assured me that she is not felt excessively sedated or had any recent falls on current regimen.  She is  aware of risks of narcotics including overdose, tolerance. we discussed quality of life and goal of pain management not to completely eliminate pain however but to reduce that that she is able to enjoy her life.  We discussed increasing baclofen to 5 mg twice daily as needed.  Advised that I do not prescribe long-term narcotics and if that the would be needed, we would need to consult with pain management.  She will discuss with Dr. Joselyn Arrow.  Alternatively, we discussed increasing gabapentin and/or changing from Luvox to Cymbalta.  She will let me know how she would like to move forward.      Relevant Medications   baclofen (LIORESAL) 10 MG tablet   diclofenac Sodium (VOLTAREN) 1 % GEL   Baclofen 5 MG TABS       I have discontinued [name] "Sandy"'s traMADol. I have also changed her Baclofen. Additionally, I am having her start on diclofenac Sodium. Lastly, I am having her maintain her traZODone, Calcium Carb-Cholecalciferol (CALCIUM 600 + D PO), Fluticasone Furoate (FLONASE SENSIMIST NA), Biotin, mupirocin ointment, pantoprazole, gabapentin, triamcinolone, meloxicam, lidocaine, acetaminophen, HYDROcodone-acetaminophen, hydrochlorothiazide, Multiple Vitamins-Iron (MULTIVITAMIN/IRON PO), aspirin, furosemide, potassium chloride, baclofen, PreviDent 5000 Plus, atorvastatin, fluvoxaMINE, and valACYclovir.   Meds ordered this encounter  Medications  . atorvastatin (LIPITOR) 10 MG tablet    Sig: Take 1 tablet (10 mg total) by mouth daily.    Dispense:  90 tablet    Refill:  0  . fluvoxaMINE (LUVOX) 100 MG tablet    Sig: TAKE 1/2-1 TABLET BY MOUTH DAILY    Dispense:  90 tablet    Refill:  0  . valACYclovir (VALTREX) 1000 MG tablet    Sig: Take two tablets ( total 2000 mg) by mouth  q12h x 1 day; Start: ASAP after symptom onset    Dispense:  6 tablet    Refill:  2  . diclofenac Sodium (VOLTAREN) 1 % GEL    Sig: Apply 4 g topically 4 (four) times daily.    Dispense:  100 g    Refill:  1     Order Specific Question:   Supervising Provider    Answer:   Derrel Nip, TERESA L [[id number]]  . Baclofen 5 MG TABS    Sig: Take 5 mg by mouth 3 (three) times daily as needed.    Dispense:  90 tablet    Refill:  1    Order Specific Question:   Supervising Provider    Answer:   Crecencio Mc [[id number]]    Return precautions given.   Risks, benefits, and alternatives of the medications and treatment plan prescribed today were discussed, and patient expressed understanding.   Education regarding symptom management and diagnosis given to patient on AVS.  Continue to follow with Burnard Hawthorne, FNP for routine health maintenance.   [name] and I agreed with plan.   Mable Paris, FNP

## 2020-06-03 NOTE — Assessment & Plan Note (Signed)
Chronic, unchanged.  She has appt  with Dr. Joselyn Arrow tomorrow, will follow.   She assured me that she is not felt excessively sedated or had any recent falls on current regimen.  She is aware of risks of narcotics including overdose, tolerance. we discussed quality of life and goal of pain management not to completely eliminate pain however but to reduce that that she is able to enjoy her life.  We discussed increasing baclofen to 5 mg twice daily as needed.  Advised that I do not prescribe long-term narcotics and if that the would be needed, we would need to consult with pain management.  She will discuss with Dr. Joselyn Arrow.  Alternatively, we discussed increasing gabapentin and/or changing from Luvox to Cymbalta.  She will let me know how she would like to move forward.

## 2020-06-08 ENCOUNTER — Ambulatory Visit: Payer: Medicare PPO

## 2020-06-08 ENCOUNTER — Encounter: Payer: Self-pay | Admitting: Family

## 2020-06-09 ENCOUNTER — Telehealth: Payer: Self-pay | Admitting: Family

## 2020-06-09 NOTE — Telephone Encounter (Signed)
noted 

## 2020-06-09 NOTE — Telephone Encounter (Signed)
Just FYI this is related to [employer] message patient sent. She was having worsening depression. When I spoke with her patient sounded well & told me that she had dealt with depression since her teenage years. She has had a some rough go's with it especially in her 28's. She stated that she wanted to talk about changing medication. She has been on current medication, Luvox for many years & feels it's no longer effective. Patient was not suicidal & was okay waiting until next week to be seen. She has an appointment schedule in hospital f/u next Wednesday 9/1 at 11:30.

## 2020-06-09 NOTE — Telephone Encounter (Signed)
Patient was returning call about the message she left on  mychart

## 2020-06-09 NOTE — Telephone Encounter (Signed)
LM asking patient to call back so I could see how she was doing & what all was going on. I wanted to try to get her in with [facility] ASAP.

## 2020-06-09 NOTE — Telephone Encounter (Signed)
See phone note. I have spoken with patient & appointment made.

## 2020-06-11 ENCOUNTER — Encounter: Payer: Self-pay | Admitting: Family

## 2020-06-11 NOTE — Telephone Encounter (Signed)
LMTCb to clarify message.

## 2020-06-12 NOTE — Telephone Encounter (Signed)
Pt returned your call. She would like a call back.  

## 2020-06-15 ENCOUNTER — Other Ambulatory Visit: Payer: Self-pay | Admitting: Family

## 2020-06-15 DIAGNOSIS — M810 Age-related osteoporosis without current pathological fracture: Secondary | ICD-10-CM

## 2020-06-17 ENCOUNTER — Ambulatory Visit (INDEPENDENT_AMBULATORY_CARE_PROVIDER_SITE_OTHER): Payer: Medicare PPO

## 2020-06-17 ENCOUNTER — Ambulatory Visit: Payer: Medicare PPO | Admitting: Family

## 2020-06-17 ENCOUNTER — Encounter: Payer: Self-pay | Admitting: Family

## 2020-06-17 ENCOUNTER — Other Ambulatory Visit: Payer: Self-pay

## 2020-06-17 VITALS — BP 132/70 | HR 82 | Temp 98.0°F | Ht 62.0 in | Wt 174.8 lb

## 2020-06-17 DIAGNOSIS — R0781 Pleurodynia: Secondary | ICD-10-CM | POA: Diagnosis not present

## 2020-06-17 DIAGNOSIS — F325 Major depressive disorder, single episode, in full remission: Secondary | ICD-10-CM

## 2020-06-17 DIAGNOSIS — L299 Pruritus, unspecified: Secondary | ICD-10-CM

## 2020-06-17 DIAGNOSIS — R1011 Right upper quadrant pain: Secondary | ICD-10-CM

## 2020-06-17 MED ORDER — BUPROPION HCL ER (XL) 150 MG PO TB24: 150.0000 mg | ORAL_TABLET | Freq: Every day | ORAL | 0 refills | Status: DC

## 2020-06-17 MED ORDER — TRIAMCINOLONE ACETONIDE 0.025 % EX CREA: 1.0000 "application " | TOPICAL_CREAM | Freq: Two times a day (BID) | CUTANEOUS | 1 refills | Status: DC

## 2020-06-17 NOTE — Progress Notes (Signed)
Subjective:    Patient ID: Claudia Andersen, female    DOB: July 21, 1941, 79 y.o.   MRN: [id number]  CC: Claudia Andersen is a 79 y.o. female who presents today for follow up.   HPI: Primary complaint of ruq pain x 3 weeks,  which feels new than prior right and left sided pain discussed at last visit.   Pain comes and goes.  Stabbing pain at times. Today sitting still feels like an ache.  Worse with leaning to right, less pain with leaning to left. Worse when she presses on right side rib cage.  After eating, may feel that she has to vomit. Limiting portion sizes to loose weight.  No abdominal distenstion.Pain is not worsened with meals.  No food trigger.  No constipation, fever, diarrhea, epigastric burning, sob, cp.  Weight is stable.  Using heating pad and voltaren gel with little relief.   GERD- feels controlled of protonix 40mg , pepcid ac. No h/o PUD.   Depression- worsening, started when 79 years old.Describes as general apathy ,decrease in motivation.  doesn't feelt that luvox is helpful as it has been in the past. Has been on luvox for 20+ years.  No anxiety.  No h/o seizure. No bulminia, anorexia. One glass of wine every night.  Trazodone helpful falling asleep.   Left shoulder blade itches 3 weeks, unchanged. Comes and goes. Cannot tell if rash there. Some relief with cortisone cream. No numbness.    Referral placed to [facility], dr Truddie Coco    follow up with dr dimmig in one week  CT AB/P 04/2020- normal gallbladder, hepatic contours H/o hysterectomy  HISTORY:  Past Medical History:  Diagnosis Date  . Ankle fracture, bimalleolar, closed, right, initial encounter 10/26/2018   d/t fall 10/26/18  . Arthritis   . Back abrasion    CRUSHED VERTEBRAE  . Closed right radial fracture 10/26/2018   s/p a fall  . 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 Depression   . Gallstones   . GERD (gastroesophageal reflux disease)   .  Migraines   . Pulmonary emboli (HCC)    history of emboli   Past Surgical History:  Procedure Laterality Date  . ABDOMINAL HYSTERECTOMY    . CATARACT EXTRACTION W/PHACO Right 03/27/2018   Procedure: CATARACT EXTRACTION PHACO AND INTRAOCULAR LENS PLACEMENT (IOC);  Surgeon: Birder Robson, MD;  Location: [facility];  Service: Ophthalmology;  Laterality: Right;  [place] 00:33 AP% 15.3 CDE 5.18 Fluid pack lot # [id number]  . CATARACT EXTRACTION W/PHACO Left 06/26/2018   Procedure: CATARACT EXTRACTION PHACO AND INTRAOCULAR LENS PLACEMENT ([place]);  Surgeon: Birder Robson, MD;  Location: [facility];  Service: Ophthalmology;  Laterality: Left;  [place] 00:38 AP% 15.2 CDE 5.80 Fluid pack lot # [id number]  . FRACTURE SURGERY     RIGHT RADIAL  . JOINT REPLACEMENT     LEFT TKR  . 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 TRIPLE SUBTALAR FUSION     Family History  Problem Relation Age of Onset  . Arthritis Mother   . Heart disease Mother   . Stroke Mother   . Arthritis Father   . Heart disease Father   . Stroke Father   . Breast cancer Neg Hx   . Colon cancer Neg Hx  Allergies: Patient has no known allergies. Current Outpatient Medications on File Prior to Visit  Medication Sig Dispense Refill  . acetaminophen (TYLENOL) 500 MG tablet Take 2 tablets (1,000 mg total) by mouth every 8 (eight) hours as needed for mild pain, moderate pain, fever or headache. 50 tablet 0  . aspirin 81 MG chewable tablet Chew by mouth daily.    Marland Kitchen atorvastatin (LIPITOR) 10 MG tablet Take 1 tablet (10 mg total) by mouth daily. 90 tablet 0  . baclofen (LIORESAL) 10 MG tablet Take 5 mg by mouth 3 (three) times daily.    . Biotin 2500 MCG CAPS Take 2,500 mcg by mouth daily.    . Calcium Carb-Cholecalciferol (CALCIUM 600 + D PO) Take 2 tablets by mouth daily.     . diclofenac  Sodium (VOLTAREN) 1 % GEL Apply 4 g topically 4 (four) times daily. 100 g 1  . Fluticasone Furoate (FLONASE SENSIMIST NA) Place 1 spray into both nostrils daily as needed (allergies).     . gabapentin (NEURONTIN) 300 MG capsule Take 1 capsule (300 mg total) by mouth at bedtime. 90 capsule 3  . hydrochlorothiazide (MICROZIDE) 12.5 MG capsule Take 12.5 mg by mouth daily.    Marland Kitchen HYDROcodone-acetaminophen (NORCO/VICODIN) 5-325 MG tablet Take by mouth.    . lidocaine (LIDODERM) 5 % Place 1 patch onto the skin every 12 (twelve) hours. Remove & Discard patch within 12 hours or as directed by MD 10 patch 0  . Multiple Vitamins-Iron (MULTIVITAMIN/IRON PO) Take by mouth daily.    . mupirocin ointment (BACTROBAN) 2 % Apply 1 application topically 2 (two) times daily. 22 g 0  . pantoprazole (PROTONIX) 40 MG tablet TAKE ONE TABLET BY MOUTH TWICE A DAY 180 tablet 2  . PREVIDENT 5000 PLUS 1.1 % CREA dental cream Take by mouth.    . traZODone (DESYREL) 50 MG tablet Take 50-100 mg by mouth at bedtime as needed for sleep.      No current facility-administered medications on file prior to visit.    Social History   Tobacco Use  . Smoking status: Former Research scientist (life sciences)  . Smokeless tobacco: Never Used  Vaping Use  . Vaping Use: Never used  Substance Use Topics  . Alcohol use: Yes    Comment: 5-7 glasses per week  . Drug use: Not on file    Review of Systems  Constitutional: Negative for chills and fever.  Respiratory: Negative for cough.   Cardiovascular: Negative for chest pain and palpitations.  Gastrointestinal: Positive for nausea. Negative for abdominal distention, diarrhea and vomiting.  Musculoskeletal: Positive for arthralgias (thoracic back pain).  Skin: Negative for rash.  Psychiatric/Behavioral: Negative for sleep disturbance and suicidal ideas. The patient is not nervous/anxious.       Objective:    BP 132/70   Pulse 82   Temp 98 F (36.7 C)   Ht 5\' 2"  (1.575 m)   Wt 174 lb 12.8 oz (79.3  kg)   SpO2 97%   BMI 31.97 kg/m  BP Readings from Last 3 Encounters:  06/17/20 132/70  06/03/20 130/78  05/19/20 132/82   Wt Readings from Last 3 Encounters:  06/17/20 174 lb 12.8 oz (79.3 kg)  06/03/20 177 lb (80.3 kg)  05/19/20 171 lb 9.6 oz (77.8 kg)    Physical Exam Vitals reviewed.  Constitutional:      Appearance: Normal appearance. She is well-developed.  Eyes:     Conjunctiva/sclera: Conjunctivae normal.  Cardiovascular:     Rate and Rhythm: Normal  rate and regular rhythm.     Pulses: Normal pulses.     Heart sounds: Normal heart sounds.  Pulmonary:     Effort: Pulmonary effort is normal.     Breath sounds: Normal breath sounds. No wheezing, rhonchi or rales.  Chest:     Chest wall: Tenderness present.       Comments: Diffuse chest wall tenderness bilaterally with palpation. More notable right rib cage as noted on diagram. No rash. No gross deformity or bony step off.  No pain with deep inspiration.  Abdominal:     General: Bowel sounds are normal. There is no distension.     Palpations: Abdomen is soft. Abdomen is not rigid. There is no fluid wave or mass.     Tenderness: There is abdominal tenderness. There is no guarding or rebound. Positive signs include Murphy's sign (slight positive).  Skin:    General: Skin is warm and dry.     Comments: No vesicular or macules seen upper scapular region with particular attention to left scapula. No dry patches of skin or excoriation noted. No nevi. No gross deformity.   Neurological:     Mental Status: She is alert.  Psychiatric:        Speech: Speech normal.        Behavior: Behavior normal.        Thought Content: Thought content normal.        Assessment & Plan:   Problem List Items Addressed This Visit      Other   Depression, major, single episode, complete remission ([place]) - Primary    Worsening of late.  General apathy, decreased motivation.  We will wean off of Luvox as I instructed on her after visit  summary and when she is completely off, will start Wellbutrin and slowly titrate medication.  Close follow-up in 2 weeks      Relevant Medications   buPROPion (WELLBUTRIN XL) 150 MG 24 hr tablet   Itching    Very benign exam.  Advised patient to mix triamcinolone with an emollient as perhaps dry skin is causing itching.  Also advised she may take Benadryl or Claritin to see if helpful for itching.  She will let me know how she is doing.      Relevant Medications   triamcinolone (KENALOG) 0.025 % cream   RUQ pain    Patient is well-appearing and in no acute distress today.  Differentials include costochondritis, cholelithiasis, musculoskeletal/ referred pain.  upon exam she had moderate tenderness with palpation bilateral sides of the rib cage which raises my concern for costochondritis.  However also she did appear to have mildly positive Murphy's sign on exam.  In particular the vomiting she complains of after eating does raise concern for cholelithiasis.  X-ray of right rib cage unrevealing.  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  Pending ultrasound right upper quadrant.  Close follow-up in 2 weeks.       Other Visit Diagnoses    Rib pain       Relevant Orders   DG Ribs Unilateral Right (Completed)   Comprehensive metabolic panel (Completed)   CBC with Differential/Platelet (Completed)       I have discontinued Claudia Bumps "Sandy"'s meloxicam, furosemide, fluvoxaMINE, and valACYclovir. I am also having her start on buPROPion. Additionally, I am having her maintain her traZODone, Calcium Carb-Cholecalciferol (CALCIUM 600 + D PO), Fluticasone Furoate (FLONASE SENSIMIST NA), Biotin, mupirocin ointment, pantoprazole, gabapentin, lidocaine, acetaminophen,  HYDROcodone-acetaminophen, hydrochlorothiazide, Multiple Vitamins-Iron (MULTIVITAMIN/IRON PO), aspirin, baclofen, PreviDent 5000 Plus, atorvastatin, diclofenac Sodium, and  triamcinolone.   Meds ordered this encounter  Medications  . buPROPion (WELLBUTRIN XL) 150 MG 24 hr tablet    Sig: Take 1 tablet (150 mg total) by mouth daily with breakfast.    Dispense:  90 tablet    Refill:  0    Order Specific Question:   Supervising Provider    Answer:   Deborra Medina L [[id number]]  . triamcinolone (KENALOG) 0.025 % cream    Sig: Apply 1 application topically 2 (two) times daily.    Dispense:  15 g    Refill:  1    Order Specific Question:   Supervising Provider    Answer:   Crecencio Mc [[id number]]    Return precautions given.   Risks, benefits, and alternatives of the medications and treatment plan prescribed today were discussed, and patient expressed understanding.   Education regarding symptom management and diagnosis given to patient on AVS.  Continue to follow with Burnard Hawthorne, FNP for routine health maintenance.   Claudia Bumps and I agreed with plan.   Claudia Paris, FNP

## 2020-06-17 NOTE — Patient Instructions (Addendum)
Etiology of pain nonspecific at this time; please stay vigilant and let me know of any new or worsening symptoms. We will pursue ultrasound of gallbladder as next step.  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 Reasonable to continue heat ( or ice depending on what is more comfortable for you). Also you may continue voltaren gel or trial capsaicin topical with lidocaine patch ( [place] makes a good one over the counter).   We will wean you from luvox.  Please start taking 50mg  luvox for about 10 days and then stop. Once off of luvox, you may start wellbutrin. We can start with wellbutrin 150mg  and then increase in a couple of weeks to 300mg  which is a common prescribed dose.   Trial of kenalog cream with an emollient ( like vaseline or eucerin) for left upper back.   Let me know how you are doing.

## 2020-06-18 ENCOUNTER — Encounter: Payer: Self-pay | Admitting: Family

## 2020-06-18 LAB — CBC WITH DIFFERENTIAL/PLATELET
Basophils Absolute: 0.1 10*3/uL (ref 0.0–0.1)
Basophils Relative: 0.9 % (ref 0.0–3.0)
Eosinophils Absolute: 0.3 10*3/uL (ref 0.0–0.7)
Eosinophils Relative: 4.7 % (ref 0.0–5.0)
HCT: 32.5 % — ABNORMAL LOW (ref 36.0–46.0)
Hemoglobin: 10.8 g/dL — ABNORMAL LOW (ref 12.0–15.0)
Lymphocytes Relative: 20 % (ref 12.0–46.0)
Lymphs Abs: 1.4 10*3/uL (ref 0.7–4.0)
MCHC: 33.3 g/dL (ref 30.0–36.0)
MCV: 92.5 fl (ref 78.0–100.0)
Monocytes Absolute: 0.7 10*3/uL (ref 0.1–1.0)
Monocytes Relative: 10.1 % (ref 3.0–12.0)
Neutro Abs: 4.7 10*3/uL (ref 1.4–7.7)
Neutrophils Relative %: 64.3 % (ref 43.0–77.0)
Platelets: 265 10*3/uL (ref 150.0–400.0)
RBC: 3.52 Mil/uL — ABNORMAL LOW (ref 3.87–5.11)
RDW: 15 % (ref 11.5–15.5)
WBC: 7.2 10*3/uL (ref 4.0–10.5)

## 2020-06-18 LAB — COMPREHENSIVE METABOLIC PANEL
ALT: 12 U/L (ref 0–35)
AST: 17 U/L (ref 0–37)
Albumin: 4.2 g/dL (ref 3.5–5.2)
Alkaline Phosphatase: 69 U/L (ref 39–117)
BUN: 17 mg/dL (ref 6–23)
CO2: 28 mEq/L (ref 19–32)
Calcium: 9 mg/dL (ref 8.4–10.5)
Chloride: 103 mEq/L (ref 96–112)
Creatinine, Ser: 0.93 mg/dL (ref 0.40–1.20)
GFR: 58.3 mL/min — ABNORMAL LOW (ref >60.00)
Glucose, Bld: 107 mg/dL — ABNORMAL HIGH (ref 70–99)
Potassium: 4.2 mEq/L (ref 3.5–5.1)
Sodium: 138 mEq/L (ref 135–145)
Total Bilirubin: 0.3 mg/dL (ref 0.2–1.2)
Total Protein: 6.5 g/dL (ref 6.0–8.3)

## 2020-06-19 ENCOUNTER — Telehealth: Payer: Self-pay

## 2020-06-19 ENCOUNTER — Other Ambulatory Visit: Payer: Self-pay | Admitting: Family

## 2020-06-19 ENCOUNTER — Ambulatory Visit
Admission: RE | Admit: 2020-06-19 | Discharge: 2020-06-19 | Disposition: A | Discharge status: Home / Self Care | Payer: Medicare PPO | Source: Ambulatory Visit | Attending: Family | Admitting: Family

## 2020-06-19 ENCOUNTER — Other Ambulatory Visit: Payer: Self-pay

## 2020-06-19 DIAGNOSIS — R1011 Right upper quadrant pain: Secondary | ICD-10-CM | POA: Insufficient documentation

## 2020-06-19 DIAGNOSIS — K8080 Other cholelithiasis without obstruction: Secondary | ICD-10-CM

## 2020-06-19 DIAGNOSIS — D649 Anemia, unspecified: Secondary | ICD-10-CM

## 2020-06-19 DIAGNOSIS — D619 Aplastic anemia, unspecified: Secondary | ICD-10-CM

## 2020-06-19 NOTE — Telephone Encounter (Signed)
I have called patient & results reviewed.

## 2020-06-19 NOTE — Telephone Encounter (Signed)
Patient was returning call for results 

## 2020-06-19 NOTE — Assessment & Plan Note (Signed)
Very benign exam.  Advised patient to mix triamcinolone with an emollient as perhaps dry skin is causing itching.  Also advised she may take Benadryl or Claritin to see if helpful for itching.  She will let me know how she is doing.

## 2020-06-19 NOTE — Telephone Encounter (Signed)
LMTCB for labs & xray results.

## 2020-06-19 NOTE — Assessment & Plan Note (Addendum)
Patient is well-appearing and in no acute distress today.  Differentials include costochondritis, cholelithiasis, musculoskeletal/ referred pain.  upon exam she had moderate tenderness with palpation bilateral sides of the rib cage which raises my concern for costochondritis.  However also she did appear to have mildly positive Murphy's sign on exam.  In particular the vomiting she complains of after eating does raise concern for cholelithiasis.  X-ray of right rib cage unrevealing.  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  Pending ultrasound right upper quadrant.  Close follow-up in 2 weeks.

## 2020-06-19 NOTE — Telephone Encounter (Signed)
LMTCB to fo over results.

## 2020-06-19 NOTE — Assessment & Plan Note (Signed)
Worsening of late.  General apathy, decreased motivation.  We will wean off of Luvox as I instructed on her after visit summary and when she is completely off, will start Wellbutrin and slowly titrate medication.  Close follow-up in 2 weeks

## 2020-06-23 ENCOUNTER — Encounter: Payer: Self-pay | Admitting: Family

## 2020-06-23 ENCOUNTER — Telehealth: Payer: Self-pay | Admitting: Family

## 2020-06-23 DIAGNOSIS — R1011 Right upper quadrant pain: Secondary | ICD-10-CM

## 2020-06-23 NOTE — Telephone Encounter (Signed)
Please advise 

## 2020-06-23 NOTE — Telephone Encounter (Signed)
Pt asked if she can get a Rx for pain. Please advise and Thank you!  Pt can be reached at [phone].

## 2020-06-23 NOTE — Addendum Note (Signed)
Addended by: Burnard Hawthorne on: 06/23/2020 11:32 AM   Modules accepted: Orders

## 2020-06-23 NOTE — Telephone Encounter (Signed)
Referral was sent. The provider has to review the referral ofc notes and they will call pt to schedule.

## 2020-06-23 NOTE — Telephone Encounter (Signed)
Claudia Andersen,  I have now placed urgent ref to GI. How soon can she been seen? And with HIDA, 9/16 was soonest appt?     My notes below   Spoke with patient Continues to have ruq pain Pain along posterior 'bra line' has improved. She doesn't feel this pain is related to her chronic back pain.  No fever,n, v, diarrhea, cp, sob.  RUQ worsened after eats, such as sausage and eggs caused RUQ pain this morning. Ache is there all the time, however worse after eating.  Taking flexeril 5 mg TID    For pain:  Advised Lidocaine patches , voltaren gel and to avoid NSAIDs as we do not have formal diagnosis at this time as differentials include cholelithiasis, PUD, costochondritis, musculoskeletal etiology Urgent referral to [facility] ( she doesn't want to go to [facility]).   Advised of any new or worsening symptoms to go to ED. She verbalized understanding

## 2020-06-24 ENCOUNTER — Other Ambulatory Visit: Payer: Self-pay | Admitting: Family

## 2020-06-24 DIAGNOSIS — R1011 Right upper quadrant pain: Secondary | ICD-10-CM

## 2020-06-30 ENCOUNTER — Ambulatory Visit
Admission: RE | Admit: 2020-06-30 | Discharge: 2020-06-30 | Disposition: A | Discharge status: Home / Self Care | Payer: Medicare PPO | Source: Ambulatory Visit | Attending: Family | Admitting: Family

## 2020-06-30 ENCOUNTER — Other Ambulatory Visit: Payer: Self-pay

## 2020-06-30 DIAGNOSIS — R1011 Right upper quadrant pain: Secondary | ICD-10-CM | POA: Diagnosis not present

## 2020-06-30 MED ORDER — TECHNETIUM TC 99M MEBROFENIN IV KIT
5.0000 | PACK | Freq: Once | INTRAVENOUS | Status: AC | PRN
  Administered 2020-06-30: 5.17 via INTRAVENOUS

## 2020-07-01 ENCOUNTER — Ambulatory Visit: Payer: Medicare PPO | Admitting: Family

## 2020-07-01 ENCOUNTER — Encounter: Payer: Self-pay | Admitting: Family

## 2020-07-01 VITALS — BP 135/78 | HR 63 | Temp 98.7°F | Ht 62.0 in | Wt 174.0 lb

## 2020-07-01 DIAGNOSIS — R1011 Right upper quadrant pain: Secondary | ICD-10-CM | POA: Diagnosis not present

## 2020-07-01 DIAGNOSIS — L299 Pruritus, unspecified: Secondary | ICD-10-CM | POA: Diagnosis not present

## 2020-07-01 MED ORDER — LORATADINE 10 MG PO TABS: 10.0000 mg | ORAL_TABLET | Freq: Every day | ORAL | 0 refills | Status: DC

## 2020-07-01 MED ORDER — FAMOTIDINE 10 MG PO TABS: 10.0000 mg | ORAL_TABLET | Freq: Two times a day (BID) | ORAL | 0 refills | Status: DC

## 2020-07-01 NOTE — Progress Notes (Signed)
Subjective:    Patient ID: Claudia Andersen, female    DOB: January 08, 1941, 79 y.o.   MRN: [id number]  CC: Claudia Andersen is a 79 y.o. female who presents today for follow up.   HPI: Depression- started wellbutrin 10 days ago. She cannot tell a difference yet in mood however feels like not enough time.    RUQ pain-unchanged. She feels after speaking with Dr Lynford Humphrey and Dr Sharyne Richters that likely pain is related to compression fracture.  Feels pain is adequately controlled. Taking vicodin once per day. Using lidocaine patch, SalonPas. Continues to use gabapentin, baclofen without feeling overly sedated. No left sided cp, pain with inspiration, cough. Remains at baseline SOB with activity.  HIDA didn't show cholelithiasis  Saw Dr Lynford Humphrey 3 days ago, planning surgery for thoracic compression fracture.   Left shoulder continues to itch throughout the day. No new lotions, cream. No fever.   No real improvement with kenalog.  Not taking benadryl     HISTORY:  Past Medical History:  Diagnosis Date  . Ankle fracture, bimalleolar, closed, right, initial encounter 10/26/2018   d/t fall 10/26/18  . Arthritis   . Back abrasion    CRUSHED VERTEBRAE  . Closed right radial fracture 10/26/2018   s/p a fall  . Compression of lumbar vertebra ([place])    L1 and L4 (from fall November 05, 2018)  . Depression   . Gallstones   . GERD (gastroesophageal reflux disease)   . Migraines   . Pulmonary emboli ([facility])    history of emboli   Past Surgical History:  Procedure Laterality Date  . ABDOMINAL HYSTERECTOMY    . CATARACT EXTRACTION W/PHACO Right 03/27/2018   Procedure: CATARACT EXTRACTION PHACO AND INTRAOCULAR LENS PLACEMENT ([facility]);  Surgeon: Birder Robson, MD;  Location: [facility] ORS;  Service: Ophthalmology;  Laterality: Right;  [place] 00:33 AP% 15.3 CDE 5.18 Fluid pack lot # [id number]  . CATARACT EXTRACTION W/PHACO Left 06/26/2018   Procedure: CATARACT EXTRACTION PHACO AND INTRAOCULAR LENS PLACEMENT ([place]);   Surgeon: Birder Robson, MD;  Location: [facility] ORS;  Service: Ophthalmology;  Laterality: Left;  [place] 00:38 AP% 15.2 CDE 5.80 Fluid pack lot # [id number]  . FRACTURE SURGERY     RIGHT RADIAL  . JOINT REPLACEMENT     LEFT TKR  . 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 TRIPLE SUBTALAR FUSION     Family History  Problem Relation Age of Onset  . Arthritis Mother   . Heart disease Mother   . Stroke Mother   . Arthritis Father   . Heart disease Father   . Stroke Father   . Breast cancer Neg Hx   . Colon cancer Neg Hx     Allergies: Patient has no known allergies. Current Outpatient Medications on File Prior to Visit  Medication Sig Dispense Refill  . acetaminophen (TYLENOL) 500 MG tablet Take 2 tablets (1,000 mg total) by mouth every 8 (eight) hours as needed for mild pain, moderate pain, fever or headache. 50 tablet 0  . aspirin 81 MG chewable tablet Chew by mouth daily.    Marland Kitchen atorvastatin (LIPITOR) 10 MG tablet Take 1 tablet (10 mg total) by mouth daily. 90 tablet 0  . baclofen (LIORESAL) 10 MG tablet Take 5 mg by mouth 3 (three) times daily.    . Biotin 2500 MCG CAPS Take 2,500 mcg  by mouth daily.    Marland Kitchen buPROPion (WELLBUTRIN XL) 150 MG 24 hr tablet Take 1 tablet (150 mg total) by mouth daily with breakfast. 90 tablet 0  . Calcium Carb-Cholecalciferol (CALCIUM 600 + D PO) Take 2 tablets by mouth daily.     . diclofenac Sodium (VOLTAREN) 1 % GEL Apply 4 g topically 4 (four) times daily. 100 g 1  . Fluticasone Furoate (FLONASE SENSIMIST NA) Place 1 spray into both nostrils daily as needed (allergies).     . gabapentin (NEURONTIN) 300 MG capsule Take 1 capsule (300 mg total) by mouth at bedtime. 90 capsule 3  . hydrochlorothiazide (MICROZIDE) 12.5 MG capsule Take 12.5 mg by mouth daily.    Marland Kitchen HYDROcodone-acetaminophen (NORCO/VICODIN) 5-325 MG  tablet Take by mouth.    . lidocaine (LIDODERM) 5 % Place 1 patch onto the skin every 12 (twelve) hours. Remove & Discard patch within 12 hours or as directed by MD 10 patch 0  . Multiple Vitamins-Iron (MULTIVITAMIN/IRON PO) Take by mouth daily.    . mupirocin ointment (BACTROBAN) 2 % Apply 1 application topically 2 (two) times daily. 22 g 0  . pantoprazole (PROTONIX) 40 MG tablet TAKE ONE TABLET BY MOUTH TWICE A DAY 180 tablet 2  . PREVIDENT 5000 PLUS 1.1 % CREA dental cream Take by mouth.    . traZODone (DESYREL) 50 MG tablet Take 50-100 mg by mouth at bedtime as needed for sleep.     Marland Kitchen triamcinolone (KENALOG) 0.025 % cream Apply 1 application topically 2 (two) times daily. 15 g 1   No current facility-administered medications on file prior to visit.    Social History   Tobacco Use  . Smoking status: Former Research scientist (life sciences)  . Smokeless tobacco: Never Used  Vaping Use  . Vaping Use: Never used  Substance Use Topics  . Alcohol use: Yes    Comment: 5-7 glasses per week  . Drug use: Not on file    Review of Systems  Constitutional: Negative for chills and fever.  Respiratory: Positive for shortness of breath (baseline). Negative for cough.   Cardiovascular: Negative for chest pain and palpitations.  Gastrointestinal: Negative for nausea and vomiting.  Musculoskeletal: Positive for arthralgias and back pain.      Objective:    BP 135/78   Pulse 63   Temp 98.7 F (37.1 C)   Ht 5\' 2"  (1.575 m)   Wt 174 lb (78.9 kg)   SpO2 99%   BMI 31.83 kg/m  BP Readings from Last 3 Encounters:  07/01/20 135/78  06/17/20 132/70  06/03/20 130/78   Wt Readings from Last 3 Encounters:  07/01/20 174 lb (78.9 kg)  06/17/20 174 lb 12.8 oz (79.3 kg)  06/03/20 177 lb (80.3 kg)    Physical Exam Vitals reviewed.  Constitutional:      Appearance: She is well-developed.  Eyes:     Conjunctiva/sclera: Conjunctivae normal.  Cardiovascular:     Rate and Rhythm: Normal rate and regular rhythm.      Pulses: Normal pulses.     Heart sounds: Normal heart sounds.  Pulmonary:     Effort: Pulmonary effort is normal.     Breath sounds: Normal breath sounds. No wheezing, rhonchi or rales.  Chest:     Chest wall: Tenderness present.       Comments: Focal pain remains over right rib cage as marked on diagram. No bony stepoff or gross deformity.  No tenderness left side rib cage. Musculoskeletal:     Thoracic back:  No swelling, tenderness or bony tenderness.  Skin:    General: Skin is warm and dry.          Comments: 3 excoriated scabs present. No increased warmth, erythema, swelling.   Neurological:     Mental Status: She is alert.  Psychiatric:        Speech: Speech normal.        Behavior: Behavior normal.        Thought Content: Thought content normal.        Assessment & Plan:   Problem List Items Addressed This Visit      Other   Itching - Primary    Idiopathic. Trial histamine blockade to see if we can get her to stop itching if itching itself will resolve ? Atopic dermatitis. She will let me know how she is doing      Relevant Medications   famotidine (PEPCID) 10 MG tablet   loratadine (CLARITIN) 10 MG tablet   RUQ pain    Unchanged. Pain controlled on current regimen. Patient declines consult with GI at this time and as would like to pursue surgery as she has recently discussed with Dr Anthony Sar. Will follow.           I am having Claudia Bumps "Sandy" start on famotidine and loratadine. I am also having her maintain her traZODone, Calcium Carb-Cholecalciferol (CALCIUM 600 + D PO), Fluticasone Furoate (FLONASE SENSIMIST NA), Biotin, mupirocin ointment, pantoprazole, gabapentin, lidocaine, acetaminophen, HYDROcodone-acetaminophen, hydrochlorothiazide, Multiple Vitamins-Iron (MULTIVITAMIN/IRON PO), aspirin, baclofen, PreviDent 5000 Plus, atorvastatin, diclofenac Sodium, buPROPion, and triamcinolone.   Meds ordered this encounter  Medications  . famotidine  (PEPCID) 10 MG tablet    Sig: Take 1 tablet (10 mg total) by mouth 2 (two) times daily.    Dispense:  30 tablet    Refill:  0    Order Specific Question:   Supervising Provider    Answer:   Deborra Medina L [[id number]]  . loratadine (CLARITIN) 10 MG tablet    Sig: Take 1 tablet (10 mg total) by mouth daily.    Dispense:  30 tablet    Refill:  0    Order Specific Question:   Supervising Provider    Answer:   Crecencio Mc [[id number]]    Return precautions given.   Risks, benefits, and alternatives of the medications and treatment plan prescribed today were discussed, and patient expressed understanding.   Education regarding symptom management and diagnosis given to patient on AVS.  Continue to follow with Burnard Hawthorne, FNP for routine health maintenance.   Claudia Bumps and I agreed with plan.   Mable Paris, FNP

## 2020-07-01 NOTE — Assessment & Plan Note (Signed)
Unchanged. Pain controlled on current regimen. Patient declines consult with GI at this time and as would like to pursue surgery as she has recently discussed with Dr Anthony Sar. Will follow.

## 2020-07-01 NOTE — Assessment & Plan Note (Signed)
Idiopathic. Trial histamine blockade to see if we can get her to stop itching if itching itself will resolve ? Atopic dermatitis. She will let me know how she is doing

## 2020-07-01 NOTE — Patient Instructions (Signed)
Lets try blocking histamine to see if itching resolves after 7 to 10 days of claritin and zantac.   Let me know how you are doing.   Histamine 1 blocker -   claritinonce daily  Histamine 2 blocker- Pepcid AC twice a day

## 2020-07-08 ENCOUNTER — Other Ambulatory Visit: Payer: Self-pay | Admitting: Family

## 2020-07-10 ENCOUNTER — Telehealth: Payer: Self-pay | Admitting: Family

## 2020-07-10 MED ORDER — HYDROCODONE-ACETAMINOPHEN 5-325 MG PO TABS
1.0000 | ORAL_TABLET | Freq: Every day | ORAL | 0 refills | Status: AC | PRN
Start: 2020-07-10 — End: 2020-07-30

## 2020-07-10 NOTE — Telephone Encounter (Signed)
I looked up patient on [place] Controlled Substances Reporting System PMP AWARE and saw no activity that raised concern of inappropriate use.  Refilled for 14 days

## 2020-07-16 ENCOUNTER — Encounter: Payer: Self-pay | Admitting: Family

## 2020-07-17 ENCOUNTER — Other Ambulatory Visit: Payer: Self-pay | Admitting: Family

## 2020-07-20 ENCOUNTER — Other Ambulatory Visit: Payer: Self-pay

## 2020-07-20 ENCOUNTER — Ambulatory Visit: Payer: Medicare PPO | Admitting: Family

## 2020-07-20 ENCOUNTER — Telehealth: Payer: Self-pay | Admitting: Family

## 2020-07-20 ENCOUNTER — Other Ambulatory Visit: Payer: Self-pay | Admitting: Family

## 2020-07-20 DIAGNOSIS — M5137 Other intervertebral disc degeneration, lumbosacral region: Secondary | ICD-10-CM

## 2020-07-20 MED ORDER — TRAZODONE HCL 50 MG PO TABS: 50.0000 mg | ORAL_TABLET | Freq: Every evening | ORAL | 1 refills | Status: DC | PRN

## 2020-07-20 NOTE — Telephone Encounter (Signed)
Called Claudia Andersen back to inquire about the Wellbutrin medication. Claudia Andersen was irritable and stated that I was questioning her intelligence. Upon explaining that the medication does not start at 30mg , Claudia Andersen became upset and went to get her medication bottle. After reading off the label, Claudia Andersen apologized and stated that she was confused. I read off the instructions for the medication to her and she verbalized understanding and had no further questions.

## 2020-07-20 NOTE — Telephone Encounter (Signed)
Call pt I placed PT referral Let us know if you dont hear back within a week in regards to an appointment being scheduled.   She is overdue for labs ( I had ordered one month ago). These are non fasting. Please sch in 1-2 weeks.   You may refill trazodone 50mg  qhs  Please confirm what dose of wellbutrin. She said 60mg ? I prescribed 150mg . I think she mistyped.. please confirm here.

## 2020-07-20 NOTE — Telephone Encounter (Signed)
Called and spoke to Claudia Andersen. Claudia Andersen verbalized understanding and had no further questions. She scheduled for labs on 07/30/20 at 10:30 am. Trazodone has been refilled. Claudia Andersen states that she did not mistype the dosage of medication. She states that her pills come in 30mg  and that she was instructed to start at 30mg  and then increase to 60mg  as 60mg  was a normal dose.

## 2020-07-23 ENCOUNTER — Ambulatory Visit (INDEPENDENT_AMBULATORY_CARE_PROVIDER_SITE_OTHER): Payer: Medicare PPO

## 2020-07-23 ENCOUNTER — Ambulatory Visit: Payer: Medicare Other

## 2020-07-23 VITALS — Ht 62.0 in | Wt 174.0 lb

## 2020-07-23 DIAGNOSIS — Z0000 Encounter for general adult medical examination without abnormal findings: Secondary | ICD-10-CM

## 2020-07-23 NOTE — Patient Instructions (Addendum)
Claudia Andersen , Thank you for taking time to come for your Medicare Wellness Visit. I appreciate your ongoing commitment to your health goals. Please review the following plan we discussed and let me know if I can assist you in the future.   These are the goals we discussed: Goals    . Follow up with Primary Care Provider     As needed       This is a list of the screening recommended for you and due dates:  Health Maintenance  Topic Date Due  . Flu Shot  01/14/2021*  . Tetanus Vaccine  07/23/2021*  .  Hepatitis C: One time screening is recommended by Center for Disease Control  (CDC) for  adults born from [birth year] through 1964.   07/23/2021*  . DEXA scan (bone density measurement)  Completed  . COVID-19 Vaccine  Completed  . Pneumonia vaccines  Completed  *Topic was postponed. The date shown is not the original due date.    Immunizations Immunization History  Administered Date(s) Administered  . Influenza, High Dose Seasonal PF 07/13/2015, 07/31/2017, 07/19/2018  . Influenza, Seasonal, Injecte, Preservative Fre 08/15/2006, 07/21/2009, 07/15/2010, 07/18/2011, 06/30/2012  . Influenza,inj,Quad PF,6+ Mos 08/08/2014, 07/20/2016  . Influenza,inj,quad, With Preservative 07/18/2019  . Moderna SARS-COVID-2 Vaccination 10/31/2019, 12/02/2019  . Pneumococcal Conjugate-13 08/18/2014, 07/20/2015  . Pneumococcal Polysaccharide-23 12/15/2008  . Td 12/04/2007  . Zoster 06/25/2012  . Zoster Recombinat (Shingrix) 11/30/2018, 03/19/2019    Preventive Care 65 Years and Older, Female Preventive care refers to lifestyle choices and visits with your health care provider that can promote health and wellness. What does preventive care include?  A yearly physical exam. This is also called an annual well check.  Dental exams once or twice a year.  Routine eye exams. Ask your health care provider how often you should have your eyes checked.  Personal lifestyle choices, including:  Daily care of your  teeth and gums.  Regular physical activity.  Eating a healthy diet.  Avoiding tobacco and drug use.  Limiting alcohol use.  Practicing safe sex.  Taking low-dose aspirin every day.  Taking vitamin and mineral supplements as recommended by your health care provider. What happens during an annual well check? The services and screenings done by your health care provider during your annual well check will depend on your age, overall health, lifestyle risk factors, and family history of disease. Counseling  Your health care provider may ask you questions about your:  Alcohol use.  Tobacco use.  Drug use.  Emotional well-being.  Home and relationship well-being.  Sexual activity.  Eating habits.  History of falls.  Memory and ability to understand (cognition).  Work and work Statistician.  Reproductive health. Screening  You may have the following tests or measurements:  Height, weight, and BMI.  Blood pressure.  Lipid and cholesterol levels. These may be checked every 5 years, or more frequently if you are over 33 years old.  Skin check.  Lung cancer screening. You may have this screening every year starting at age 17 if you have a 30-pack-year history of smoking and currently smoke or have quit within the past 15 years.  Fecal occult blood test (FOBT) of the stool. You may have this test every year starting at age 27.  Flexible sigmoidoscopy or colonoscopy. You may have a sigmoidoscopy every 5 years or a colonoscopy every 10 years starting at age 51.  Hepatitis C blood test.  Hepatitis B blood test.  Sexually transmitted disease (STD)  testing.  Diabetes screening. This is done by checking your blood sugar (glucose) after you have not eaten for a while (fasting). You may have this done every 1-3 years.  Bone density scan. This is done to screen for osteoporosis. You may have this done starting at age 38.  Mammogram. This may be done every 1-2 years. Talk  to your health care provider about how often you should have regular mammograms. Talk with your health care provider about your test results, treatment options, and if necessary, the need for more tests. Vaccines  Your health care provider may recommend certain vaccines, such as:  Influenza vaccine. This is recommended every year.  Tetanus, diphtheria, and acellular pertussis (Tdap, Td) vaccine. You may need a Td booster every 10 years.  Zoster vaccine. You may need this after age 68.  Pneumococcal 13-valent conjugate (PCV13) vaccine. One dose is recommended after age 68.  Pneumococcal polysaccharide (PPSV23) vaccine. One dose is recommended after age 42. Talk to your health care provider about which screenings and vaccines you need and how often you need them. This information is not intended to replace advice given to you by your health care provider. Make sure you discuss any questions you have with your health care provider. Document Released: 10/30/2015 Document Revised: 06/22/2016 Document Reviewed: 08/04/2015 Elsevier Interactive Patient Education  2017 [place] Prevention in the Home Falls can cause injuries. They can happen to people of all ages. There are many things you can do to make your home safe and to help prevent falls. What can I do on the outside of my home?  Regularly fix the edges of walkways and driveways and fix any cracks.  Remove anything that might make you trip as you walk through a door, such as a raised step or threshold.  Trim any bushes or trees on the path to your home.  Use bright outdoor lighting.  Clear any walking paths of anything that might make someone trip, such as rocks or tools.  Regularly check to see if handrails are loose or broken. Make sure that both sides of any steps have handrails.  Any raised decks and porches should have guardrails on the edges.  Have any leaves, snow, or ice cleared regularly.  Use sand or salt on  walking paths during winter.  Clean up any spills in your garage right away. This includes oil or grease spills. What can I do in the bathroom?  Use night lights.  Install grab bars by the toilet and in the tub and shower. Do not use towel bars as grab bars.  Use non-skid mats or decals in the tub or shower.  If you need to sit down in the shower, use a plastic, non-slip stool.  Keep the floor dry. Clean up any water that spills on the floor as soon as it happens.  Remove soap buildup in the tub or shower regularly.  Attach bath mats securely with double-sided non-slip rug tape.  Do not have throw rugs and other things on the floor that can make you trip. What can I do in the bedroom?  Use night lights.  Make sure that you have a light by your bed that is easy to reach.  Do not use any sheets or blankets that are too big for your bed. They should not hang down onto the floor.  Have a firm chair that has side arms. You can use this for support while you get dressed.  Do not  have throw rugs and other things on the floor that can make you trip. What can I do in the kitchen?  Clean up any spills right away.  Avoid walking on wet floors.  Keep items that you use a lot in easy-to-reach places.  If you need to reach something above you, use a strong step stool that has a grab bar.  Keep electrical cords out of the way.  Do not use floor polish or wax that makes floors slippery. If you must use wax, use non-skid floor wax.  Do not have throw rugs and other things on the floor that can make you trip. What can I do with my stairs?  Do not leave any items on the stairs.  Make sure that there are handrails on both sides of the stairs and use them. Fix handrails that are broken or loose. Make sure that handrails are as long as the stairways.  Check any carpeting to make sure that it is firmly attached to the stairs. Fix any carpet that is loose or worn.  Avoid having throw  rugs at the top or bottom of the stairs. If you do have throw rugs, attach them to the floor with carpet tape.  Make sure that you have a light switch at the top of the stairs and the bottom of the stairs. If you do not have them, ask someone to add them for you. What else can I do to help prevent falls?  Wear shoes that:  Do not have high heels.  Have rubber bottoms.  Are comfortable and fit you well.  Are closed at the toe. Do not wear sandals.  If you use a stepladder:  Make sure that it is fully opened. Do not climb a closed stepladder.  Make sure that both sides of the stepladder are locked into place.  Ask someone to hold it for you, if possible.  Clearly mark and make sure that you can see:  Any grab bars or handrails.  First and last steps.  Where the edge of each step is.  Use tools that help you move around (mobility aids) if they are needed. These include:  Canes.  Walkers.  Scooters.  Crutches.  Turn on the lights when you go into a dark area. Replace any light bulbs as soon as they burn out.  Set up your furniture so you have a clear path. Avoid moving your furniture around.  If any of your floors are uneven, fix them.  If there are any pets around you, be aware of where they are.  Review your medicines with your doctor. Some medicines can make you feel dizzy. This can increase your chance of falling. Ask your doctor what other things that you can do to help prevent falls. This information is not intended to replace advice given to you by your health care provider. Make sure you discuss any questions you have with your health care provider. Document Released: 07/30/2009 Document Revised: 03/10/2016 Document Reviewed: 11/07/2014 Elsevier Interactive Patient Education  2017 [employer].

## 2020-07-23 NOTE — Progress Notes (Addendum)
Subjective:   Claudia Andersen is a 79 y.o. female who presents for Medicare Annual (Subsequent) preventive examination.  Review of Systems    No ROS.  Medicare Wellness Virtual Visit.  Cardiac Risk Factors include: advanced age (>71men, >91 women)     Objective:    Today's Vitals   07/23/20 1334  Weight: 174 lb (78.9 kg)  Height: 5\' 2"  (1.575 m)   Body mass index is 31.83 kg/m.  Advanced Directives 07/23/2020 05/08/2020 07/23/2019 01/09/2019 07/19/2018 06/26/2018  Does Patient Have a Medical Advance Directive? Yes Yes Yes Yes Yes Yes  Type of Advance Directive - - Living will Living will [place];Living will [place];Living will  Does patient want to make changes to medical advance directive? No - Patient declined - No - Patient declined No - Patient declined No - Patient declined No - Patient declined  Copy of [place] in Chart? - - - - No - copy requested No - copy requested    Current Medications (verified) Outpatient Encounter Medications as of 07/23/2020  Medication Sig  . acetaminophen (TYLENOL) 500 MG tablet Take 2 tablets (1,000 mg total) by mouth every 8 (eight) hours as needed for mild pain, moderate pain, fever or headache.  Marland Kitchen aspirin 81 MG chewable tablet Chew by mouth daily.  Marland Kitchen atorvastatin (LIPITOR) 10 MG tablet Take 1 tablet (10 mg total) by mouth daily.  . baclofen (LIORESAL) 10 MG tablet Take 5 mg by mouth 3 (three) times daily.  . Biotin 2500 MCG CAPS Take 2,500 mcg by mouth daily.  Marland Kitchen buPROPion (WELLBUTRIN XL) 150 MG 24 hr tablet Take 1 tablet (150 mg total) by mouth daily with breakfast.  . Calcium Carb-Cholecalciferol (CALCIUM 600 + D PO) Take 2 tablets by mouth daily.   . cyclobenzaprine (FLEXERIL) 5 MG tablet TAKE 1 TABLET BY MOUTH 3 TIMES DAILY  . diclofenac Sodium (VOLTAREN) 1 % GEL Apply 4 g topically 4 (four) times daily.  . famotidine (PEPCID) 10 MG tablet Take 1 tablet (10 mg total) by mouth 2  (two) times daily.  . Fluticasone Furoate (FLONASE SENSIMIST NA) Place 1 spray into both nostrils daily as needed (allergies).   . gabapentin (NEURONTIN) 300 MG capsule Take 1 capsule (300 mg total) by mouth at bedtime.  . hydrochlorothiazide (MICROZIDE) 12.5 MG capsule Take 12.5 mg by mouth daily.  Marland Kitchen HYDROcodone-acetaminophen (NORCO/VICODIN) 5-325 MG tablet Take 1 tablet by mouth daily as needed for up to 20 days for moderate pain.  Marland Kitchen lidocaine (LIDODERM) 5 % Place 1 patch onto the skin every 12 (twelve) hours. Remove & Discard patch within 12 hours or as directed by MD  . loratadine (CLARITIN) 10 MG tablet Take 1 tablet (10 mg total) by mouth daily.  . Multiple Vitamins-Iron (MULTIVITAMIN/IRON PO) Take by mouth daily.  . mupirocin ointment (BACTROBAN) 2 % Apply 1 application topically 2 (two) times daily.  . pantoprazole (PROTONIX) 40 MG tablet TAKE ONE TABLET BY MOUTH TWICE A DAY  . PREVIDENT 5000 PLUS 1.1 % CREA dental cream Take by mouth.  . traZODone (DESYREL) 50 MG tablet Take 1 tablet (50 mg total) by mouth at bedtime as needed for sleep.  Marland Kitchen triamcinolone (KENALOG) 0.025 % cream Apply 1 application topically 2 (two) times daily.   No facility-administered encounter medications on file as of 07/23/2020.    Allergies (verified) Patient has no known allergies.   History: Past Medical History:  Diagnosis Date  . Ankle fracture,  bimalleolar, closed, right, initial encounter 10/26/2018   d/t fall 10/26/18  . Arthritis   . Back abrasion    CRUSHED VERTEBRAE  . Closed right radial fracture 10/26/2018   s/p a fall  . Compression of lumbar vertebra ([place])    L1 and L4 (from fall November 05, 2018)  . Depression   . Gallstones   . GERD (gastroesophageal reflux disease)   . Migraines   . Pulmonary emboli (HCC)    history of emboli   Past Surgical History:  Procedure Laterality Date  . ABDOMINAL HYSTERECTOMY    . CATARACT EXTRACTION W/PHACO Right 03/27/2018   Procedure: CATARACT  EXTRACTION PHACO AND INTRAOCULAR LENS PLACEMENT (IOC);  Surgeon: Birder Robson, MD;  Location: [facility] ORS;  Service: Ophthalmology;  Laterality: Right;  [place] 00:33 AP% 15.3 CDE 5.18 Fluid pack lot # [id number]  . CATARACT EXTRACTION W/PHACO Left 06/26/2018   Procedure: CATARACT EXTRACTION PHACO AND INTRAOCULAR LENS PLACEMENT ([place]);  Surgeon: Birder Robson, MD;  Location: [facility] ORS;  Service: Ophthalmology;  Laterality: Left;  [place] 00:38 AP% 15.2 CDE 5.80 Fluid pack lot # [id number]  . FRACTURE SURGERY     RIGHT RADIAL  . JOINT REPLACEMENT     LEFT TKR  . 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 TRIPLE SUBTALAR FUSION     Family History  Problem Relation Age of Onset  . Arthritis Mother   . Heart disease Mother   . Stroke Mother   . Arthritis Father   . Heart disease Father   . Stroke Father   . Breast cancer Neg Hx   . Colon cancer Neg Hx    Social History   Socioeconomic History  . Marital status: Divorced    Spouse name: Not on file  . Number of children: Not on file  . Years of education: Not on file  . Highest education level: Not on file  Occupational History  . Not on file  Tobacco Use  . Smoking status: Former Research scientist (life sciences)  . Smokeless tobacco: Never Used  Vaping Use  . Vaping Use: Never used  Substance and Sexual Activity  . Alcohol use: Yes    Comment: 5-7 glasses per week  . Drug use: Not on file  . Sexual activity: Not on file  Other Topics Concern  . 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    Lives at [facility] Strain:   . Difficulty of Paying Living Expenses: Not on file  Food Insecurity:   . Worried About Charity fundraiser in the Last Year: Not on file  . Ran Out of Food in the Last Year: Not on file    Transportation Needs:   . Lack of Transportation (Medical): Not on file  . Lack of Transportation (Non-Medical): Not on file  Physical Activity:   . Days of Exercise per Week: Not on file  . Minutes of Exercise per Session: Not on file  Stress:   . Feeling of Stress : Not on file  Social Connections:   . Frequency of Communication with Friends and Family: Not on file  . Frequency of Social Gatherings with Friends and Family: Not on file  . Attends Religious Services: Not on file  . Active Member  of Clubs or Organizations: Not on file  . Attends Archivist Meetings: Not on file  . Marital Status: Not on file    Tobacco Counseling Counseling given: Not Answered   Clinical Intake:  Pre-visit preparation completed: Yes        Diabetes: No  How often do you need to have someone help you when you read instructions, pamphlets, or other written materials from your doctor or pharmacy?: 1 - Never  Interpreter Needed?: No      Activities of Daily Living In your present state of health, do you have any difficulty performing the following activities: 07/23/2020  Hearing? N  Vision? N  Difficulty concentrating or making decisions? N  Walking or climbing stairs? N  Dressing or bathing? N  Doing errands, shopping? N  Preparing Food and eating ? N  Using the Toilet? N  In the past six months, have you accidently leaked urine? N  Do you have problems with loss of bowel control? N  Managing your Medications? N  Managing your Finances? N  Housekeeping or managing your Housekeeping? N  Some recent data might be hidden    Patient Care Team: Burnard Hawthorne, FNP as PCP - General (Family Medicine)  Indicate any recent Medical Services you may have received from other than [facility] providers in the past year (date may be approximate).     Assessment:   This is a routine wellness examination for Claudia Andersen.  I connected with Lenise today by telephone and verified that I  am speaking with the correct person using two identifiers. Location patient: home Location provider: work Persons participating in the virtual visit: patient, Marine scientist.    I discussed the limitations, risks, security and privacy concerns of performing an evaluation and management service by telephone and the availability of in person appointments. The patient expressed understanding and verbally consented to this telephonic visit.    Interactive audio and video telecommunications were attempted between this provider and patient, however failed, due to patient having technical difficulties OR patient did not have access to video capability.  We continued and completed visit with audio only.  Some vital signs may be absent or patient reported.   Hearing/Vision screen  Hearing Screening   125Hz  250Hz  500Hz  1000Hz  2000Hz  3000Hz  4000Hz  6000Hz  8000Hz   Right ear:           Left ear:           Comments: Patient is able to hear conversational tones without difficulty. No issues reported.   Vision Screening Comments: Visual acuity not assessed, virtual visit.   Dietary issues and exercise activities discussed:    Goals    . Follow up with Primary Care Provider     As needed      Depression Screen PHQ 2/9 Scores 07/23/2020 06/18/2020 06/03/2020 02/21/2020 07/23/2019 02/20/2019 07/19/2018  PHQ - 2 Score 0 1 0 0 0 0 0  PHQ- 9 Score 0 7 - 2 - 0 -    Fall Risk Fall Risk  07/23/2020 06/03/2020 05/19/2020 05/11/2020 04/10/2020  Falls in the past year? 0 0 0 0 0  Number falls in past yr: 0 0 - 0 0  Injury with Fall? - 0 - 0 0  Comment - - - - -  Follow up Falls evaluation completed Falls evaluation completed Falls evaluation completed Falls evaluation completed Falls evaluation completed   Handrails in use when climbing stairs? Yes Home free of loose throw rugs in walkways, pet beds, electrical  cords, etc? Yes  Adequate lighting in your home to reduce risk of falls? Yes   TIMED UP AND GO: Was the test  performed? No . Virtual visit.  Cognitive Function: Patient is alert and oriented x3.   MMSE - Mini Mental State Exam 07/19/2018  Orientation to time 5  Orientation to Place 5  Registration 3  Attention/ Calculation 5  Recall 3  Language- name 2 objects 2  Language- repeat 1  Language- follow 3 step command 3  Language- read & follow direction 1  Write a sentence 1  Copy design 1  Total score 30     6CIT Screen 07/23/2019  What Year? 0 points  What month? 0 points  What time? 0 points  Count back from 20 0 points  Months in reverse 0 points  Repeat phrase 0 points  Total Score 0    Immunizations Immunization History  Administered Date(s) Administered  . Influenza, High Dose Seasonal PF 07/13/2015, 07/31/2017, 07/19/2018  . Influenza, Seasonal, Injecte, Preservative Fre 08/15/2006, 07/21/2009, 07/15/2010, 07/18/2011, 06/30/2012  . Influenza,inj,Quad PF,6+ Mos 08/08/2014, 07/20/2016  . Influenza,inj,quad, With Preservative 07/18/2019  . Moderna SARS-COVID-2 Vaccination 10/31/2019, 12/02/2019  . Pneumococcal Conjugate-13 08/18/2014, 07/20/2015  . Pneumococcal Polysaccharide-23 12/15/2008  . Td 12/04/2007  . Zoster 06/25/2012  . Zoster Recombinat (Shingrix) 11/30/2018, 03/19/2019    Tdap-deferred Hep C Screening- deferred Influenza vaccine- plans to receive through [facility] later in the season. Deferred.   Health Maintenance Health Maintenance  Topic Date Due  . INFLUENZA VACCINE  01/14/2021 (Originally 05/17/2020)  . TETANUS/TDAP  07/23/2021 (Originally 12/03/2017)  . Hepatitis C Screening  07/23/2021 (Originally 1941-08-07)  . DEXA SCAN  Completed  . COVID-19 Vaccine  Completed  . PNA vac Low Risk Adult  Completed   Dental Screening: Recommended annual dental exams for proper oral hygiene  Community Resource Referral / Chronic Care Management: CRR required this visit?  No   CCM required this visit?  No      Plan:    Keep all routine maintenance  appointments.   Follow up 09/30/20 @ 10:30  I have personally reviewed and noted the following in the patient's chart:   . Medical and social history . Use of alcohol, tobacco or illicit drugs  . Current medications and supplements . Functional ability and status . Nutritional status . Physical activity . Advanced directives . List of other physicians . Hospitalizations, surgeries, and ER visits in previous 12 months . Vitals . Screenings to include cognitive, depression, and falls . Referrals and appointments  In addition, I have reviewed and discussed with patient certain preventive protocols, quality metrics, and best practice recommendations. A written personalized care plan for preventive services as well as general preventive health recommendations were provided to patient via mychart.     Varney Biles, LPN   [id number]   Agree with plan. Mable Paris, NP

## 2020-07-28 ENCOUNTER — Telehealth: Payer: Self-pay | Admitting: *Deleted

## 2020-07-28 DIAGNOSIS — D619 Aplastic anemia, unspecified: Secondary | ICD-10-CM

## 2020-07-28 NOTE — Telephone Encounter (Signed)
Please place future orders for lab appt.  

## 2020-07-29 ENCOUNTER — Encounter: Payer: Self-pay | Admitting: Family

## 2020-07-29 ENCOUNTER — Other Ambulatory Visit: Payer: Self-pay | Admitting: Family

## 2020-07-29 DIAGNOSIS — M5137 Other intervertebral disc degeneration, lumbosacral region: Secondary | ICD-10-CM

## 2020-07-29 MED ORDER — BACLOFEN 10 MG PO TABS: 5.0000 mg | ORAL_TABLET | Freq: Three times a day (TID) | ORAL | 1 refills | Status: DC | PRN

## 2020-07-29 NOTE — Telephone Encounter (Signed)
Urine and serum ordered

## 2020-07-30 ENCOUNTER — Other Ambulatory Visit (INDEPENDENT_AMBULATORY_CARE_PROVIDER_SITE_OTHER): Payer: Medicare PPO

## 2020-07-30 ENCOUNTER — Other Ambulatory Visit: Payer: Self-pay

## 2020-07-30 DIAGNOSIS — D619 Aplastic anemia, unspecified: Secondary | ICD-10-CM

## 2020-07-30 LAB — IBC + FERRITIN
Ferritin: 18.7 ng/mL (ref 10.0–291.0)
Iron: 108 ug/dL (ref 42–145)
Saturation Ratios: 34.9 % (ref 20.0–50.0)
Transferrin: 221 mg/dL (ref 212.0–360.0)

## 2020-07-30 LAB — B12 AND FOLATE PANEL
Folate: 14.7 ng/mL (ref >5.9)
Vitamin B-12: 297 pg/mL (ref 211–911)

## 2020-07-30 NOTE — Addendum Note (Signed)
Addended by: Leeanne Rio on: 07/30/2020 02:31 PM   Modules accepted: Orders

## 2020-07-30 NOTE — Addendum Note (Signed)
Addended by: Tor Netters I on: 07/30/2020 01:11 PM   Modules accepted: Orders

## 2020-08-03 LAB — MULTIPLE MYELOMA PANEL, SERUM
Albumin SerPl Elph-Mcnc: 4.2 g/dL (ref 2.9–4.4)
Albumin/Glob SerPl: 1.7 (ref 0.7–1.7)
Alpha 1: 0.3 g/dL (ref 0.0–0.4)
Alpha2 Glob SerPl Elph-Mcnc: 0.7 g/dL (ref 0.4–1.0)
B-Globulin SerPl Elph-Mcnc: 0.9 g/dL (ref 0.7–1.3)
Gamma Glob SerPl Elph-Mcnc: 0.8 g/dL (ref 0.4–1.8)
Globulin, Total: 2.6 g/dL (ref 2.2–3.9)
IgA/Immunoglobulin A, Serum: 152 mg/dL (ref 64–422)
IgG (Immunoglobin G), Serum: 789 mg/dL (ref 586–1602)
IgM (Immunoglobulin M), Srm: 89 mg/dL (ref 26–217)

## 2020-08-03 LAB — PE AND FLC, SERUM
Ig Kappa Free Light Chain: 17.5 mg/L (ref 3.3–19.4)
Ig Lambda Free Light Chain: 10.2 mg/L (ref 5.7–26.3)
KAPPA/LAMBDA RATIO: 1.72 — ABNORMAL HIGH (ref 0.26–1.65)
Total Protein: 6.8 g/dL (ref 6.0–8.5)

## 2020-08-04 LAB — IFE AND PE, RANDOM URINE
% BETA, Urine: 26.9 %
ALBUMIN, U: 36.2 %
ALPHA 1 URINE: 6.2 %
ALPHA-2-GLOBULIN, U: 14.7 %
GAMMA GLOBULIN URINE: 15.9 %
Protein, Ur: 11.2 mg/dL

## 2020-08-05 IMAGING — US US EXTREM LOW VENOUS
1 series · 13 of 24 positions shown · non-contrast
Comparison: None.

CLINICAL DATA: Bilateral lower extremity edema for several weeks.
Evaluate for DVT.



[Series 1: us extrem low venous · 0.08mm/px · 13 of 60 slices shown]
[im 1/60]
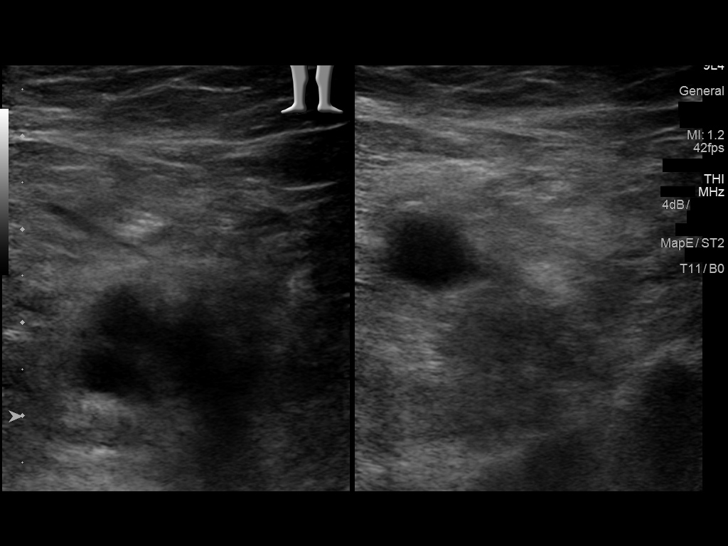
[im 6/60]
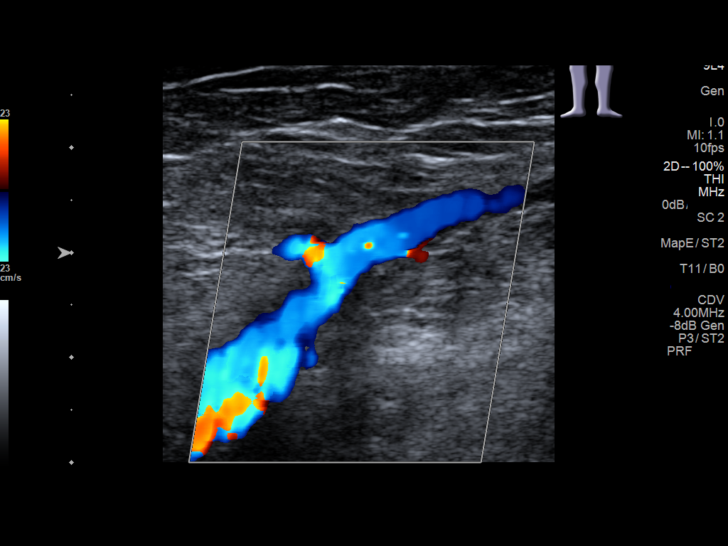
[im 11/60]
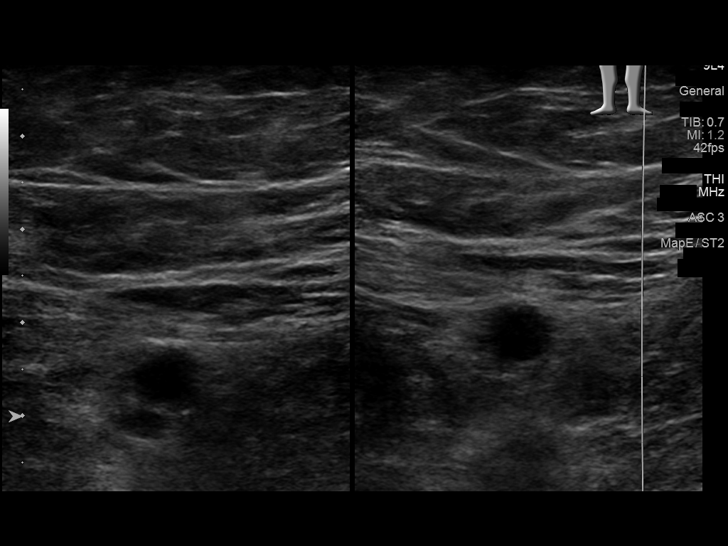
[im 16/60]
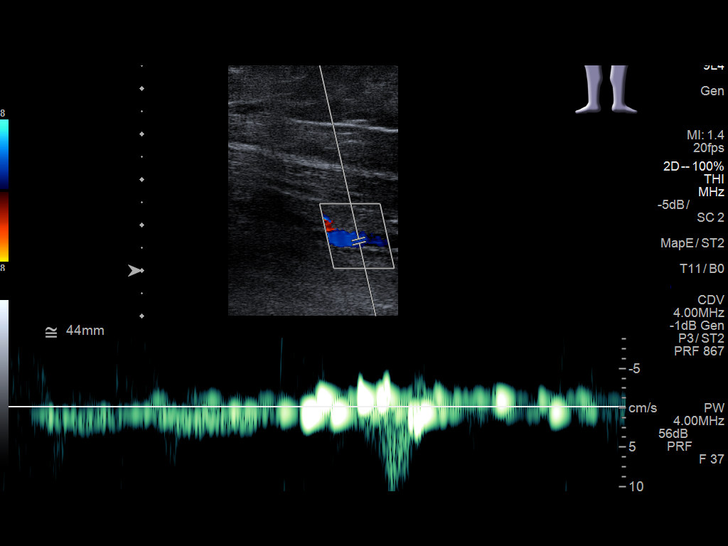
[im 21/60]
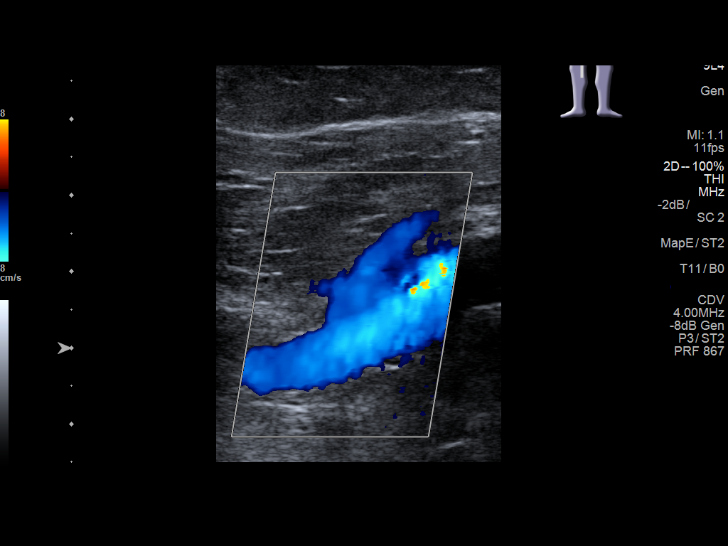
[im 26/60]
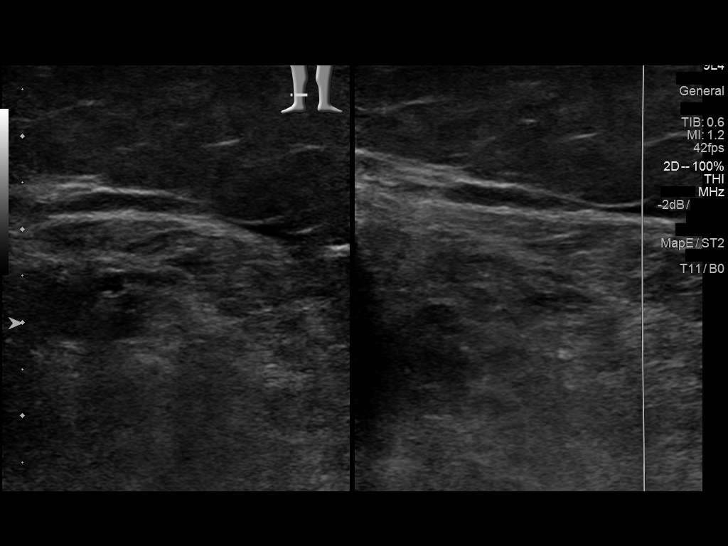
[im 31/60]
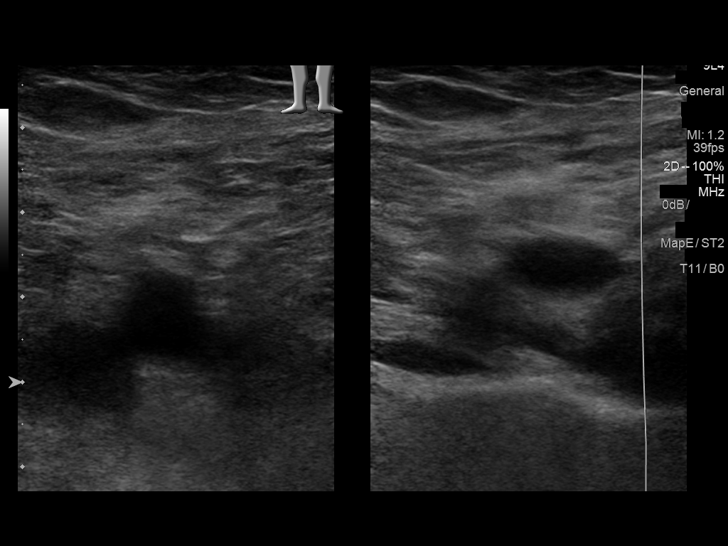
[im 34/60]
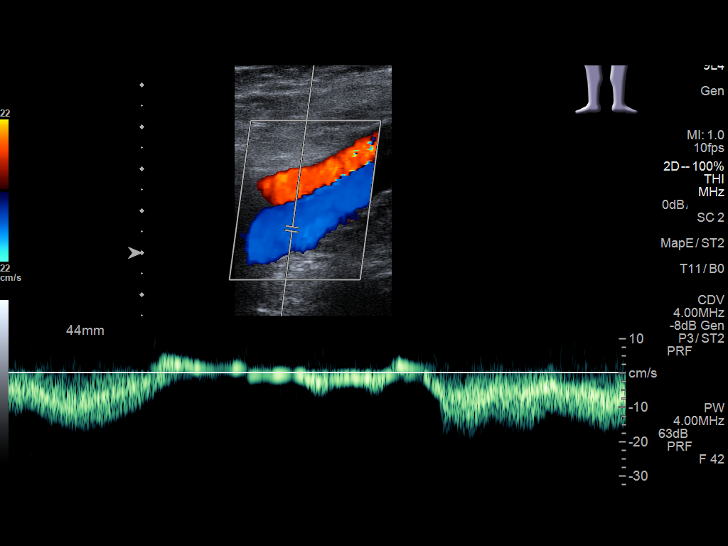
[im 39/60]
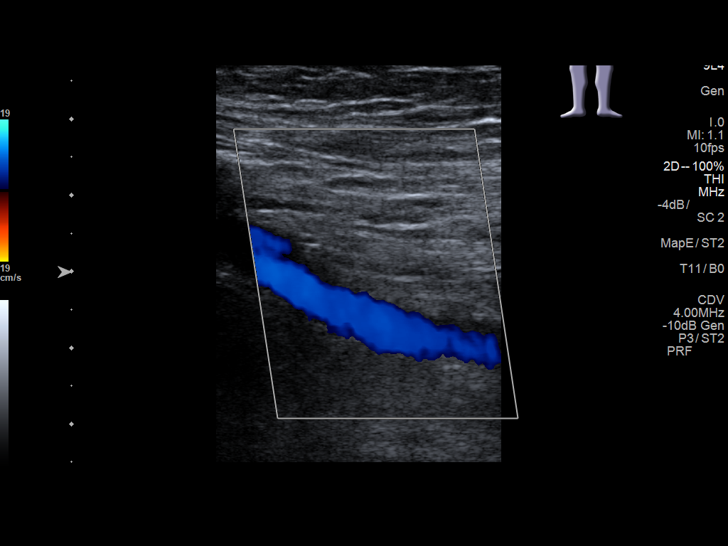
[im 44/60]
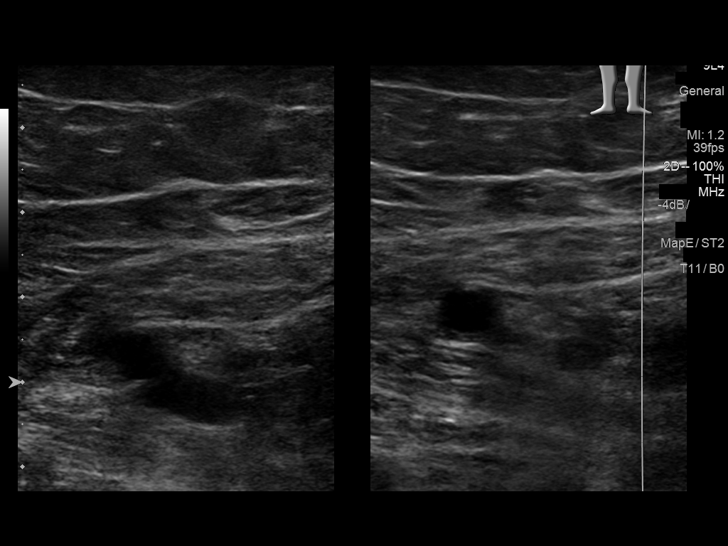
[im 49/60]
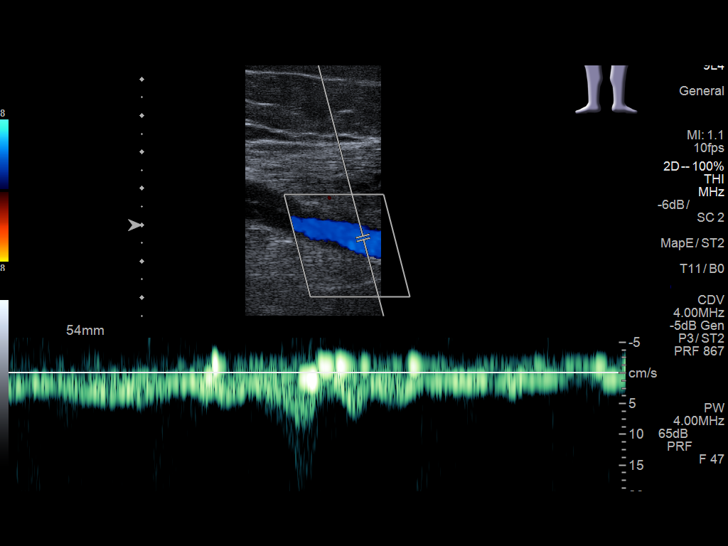
[im 54/60]
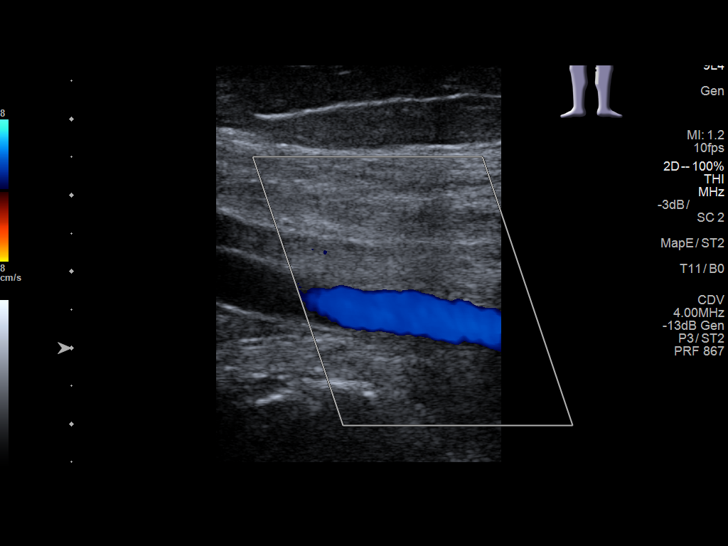
[im 60/60]
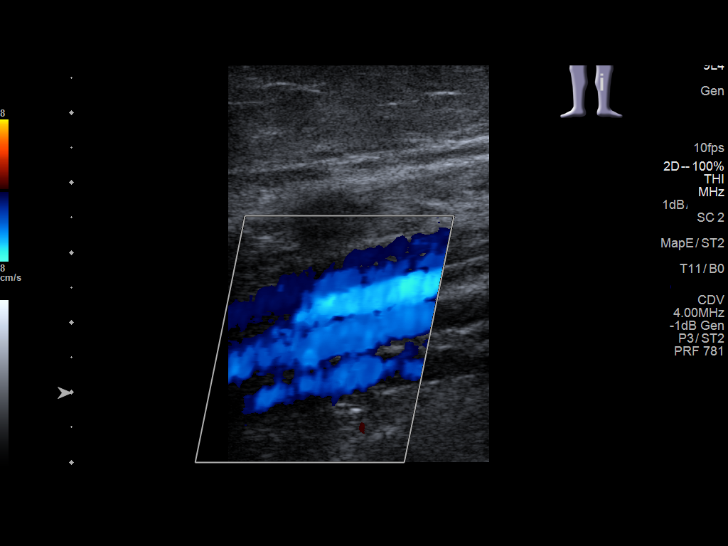

[13 of 24 positions shown; findings below may reference images not displayed]

FINDINGS: RIGHT LOWER EXTREMITY

Common Femoral Vein: No evidence of thrombus. Normal
compressibility, respiratory phasicity and response to augmentation.

Saphenofemoral Junction: No evidence of thrombus. Normal
compressibility and flow on color Doppler imaging.

Profunda Femoral Vein: No evidence of thrombus. Normal
compressibility and flow on color Doppler imaging.

Femoral Vein: No evidence of thrombus. Normal compressibility,
respiratory phasicity and response to augmentation.

Popliteal Vein: No evidence of thrombus. Normal compressibility,
respiratory phasicity and response to augmentation.

Calf Veins: No evidence of thrombus. Normal compressibility and flow
on color Doppler imaging.

Superficial Great Saphenous Vein: No evidence of thrombus. Normal
compressibility.

Venous Reflux:  None.

Other Findings:  None.

LEFT LOWER EXTREMITY

Common Femoral Vein: No evidence of thrombus. Normal
compressibility, respiratory phasicity and response to augmentation.

Saphenofemoral Junction: No evidence of thrombus. Normal
compressibility and flow on color Doppler imaging.

Profunda Femoral Vein: No evidence of thrombus. Normal
compressibility and flow on color Doppler imaging.

Femoral Vein: No evidence of thrombus. Normal compressibility,
respiratory phasicity and response to augmentation.

Popliteal Vein: No evidence of thrombus. Normal compressibility,
respiratory phasicity and response to augmentation.

Calf Veins: No evidence of thrombus. Normal compressibility and flow
on color Doppler imaging.

Superficial Great Saphenous Vein: No evidence of thrombus. Normal
compressibility.

Venous Reflux:  None.

Other Findings:  None.
IMPRESSION: No evidence of DVT within either lower extremity.

## 2020-08-07 ENCOUNTER — Telehealth: Payer: Self-pay | Admitting: Family

## 2020-08-07 NOTE — Telephone Encounter (Signed)
Ok. Thanks!

## 2020-08-07 NOTE — Telephone Encounter (Signed)
Pt returned your call and said she no longer needs referral. The problem cleared itself.

## 2020-08-07 NOTE — Telephone Encounter (Signed)
lft vm to follow up with pt if pt was sch for the referral to [facility] gastro.

## 2020-08-10 ENCOUNTER — Telehealth: Payer: Self-pay | Admitting: Family

## 2020-08-10 NOTE — Telephone Encounter (Signed)
Call pt Please ensure she called back [facility] pt , appears that have called her however I dont see she has started PT yet

## 2020-08-10 NOTE — Telephone Encounter (Signed)
-----   Message from Ashley Jacobs sent at 07/30/2020  8:00 AM EDT ----- Good morning!  Friendly called pt once already. ----- Message ----- From: Burnard Hawthorne, FNP Sent: 07/29/2020   3:52 PM EDT To: Ashley Jacobs  What is status of PT referral?

## 2020-08-11 NOTE — Telephone Encounter (Signed)
LMTCB

## 2020-08-18 LAB — COLOGUARD: Cologuard: POSITIVE — AB

## 2020-08-19 ENCOUNTER — Other Ambulatory Visit: Payer: Self-pay | Admitting: Family

## 2020-08-19 DIAGNOSIS — R899 Unspecified abnormal finding in specimens from other organs, systems and tissues: Secondary | ICD-10-CM

## 2020-08-20 ENCOUNTER — Encounter: Payer: Self-pay | Admitting: Family

## 2020-08-20 NOTE — Telephone Encounter (Signed)
I have corresponded with pt via mychart.

## 2020-08-21 ENCOUNTER — Encounter: Payer: Self-pay | Admitting: Family

## 2020-08-21 ENCOUNTER — Telehealth (INDEPENDENT_AMBULATORY_CARE_PROVIDER_SITE_OTHER): Payer: Medicare PPO | Admitting: Family

## 2020-08-21 DIAGNOSIS — R1011 Right upper quadrant pain: Secondary | ICD-10-CM | POA: Diagnosis not present

## 2020-08-21 DIAGNOSIS — D649 Anemia, unspecified: Secondary | ICD-10-CM

## 2020-08-21 DIAGNOSIS — F325 Major depressive disorder, single episode, in full remission: Secondary | ICD-10-CM | POA: Diagnosis not present

## 2020-08-21 NOTE — Assessment & Plan Note (Signed)
Normal MCV. Ferritin on low end. Trial ferrous sulfate. Pending completion of multiple myeloma evaluation with 24 urine UPEP/UIFE. She has submitted cologuard. Will repeat CBC at follow up.

## 2020-08-21 NOTE — Assessment & Plan Note (Addendum)
Very pleased with improvement of mood and weight loss. Continue wellbutrin 150mg  and trazodone 50mg .

## 2020-08-21 NOTE — Progress Notes (Signed)
Verbal consent for services obtained from patient prior to services given to TELEPHONE visit:   Location of call:  provider at work patient at home  Names of all persons present for services: Margaret Arnett, NP and patient Follow up  irritability  Irritability has resolved. No complaints today. She feels wellbutrin has been helpful and is compliant with 150mg qam. Sleeping well.   Compliant with tradozone 50mg qd   Had been on Luvox for many years. She is off medication now.   RUQ pain- doing much better since surgery. Muscle spasms resolved.  She feels very much related to compression fracture and since  Had planned to go PT today for dry needling. She is no longer taking vicodin nor using lidocaine patch, baclofen. She continues gabapentin 300mg qhs  She continues to follow with [facility]  Leg swelling- improved. takes hctz prn.   Anemia- She doesn't take iron. She submitted cologuard this week  Upcoming appointment to pick up 24 hr urine container  A/P/next steps:  Problem List Items Addressed This Visit      Other   Depression, major, single episode, complete remission (HCC)    Very pleased with improvement of mood and weight loss. Continue wellbutrin 150mg and trazodone 50mg.       Normocytic anemia    Normal MCV. Ferritin on low end. Trial ferrous sulfate. Pending completion of multiple myeloma evaluation with 24 urine UPEP/UIFE. She has submitted cologuard. Will repeat CBC at follow up.      RUQ pain    Very pleased to hear such a good report today. She declines further evaluation for pain at this time and wil continue to follow with [facility].          I spent 18 min  discussing plan of care over the phone.         

## 2020-08-21 NOTE — Assessment & Plan Note (Addendum)
Very pleased to hear such a good report today. She declines further evaluation for pain at this time and wil continue to follow with [facility].

## 2020-08-21 NOTE — Patient Instructions (Signed)
Please purchase over-the-counter iron called, ferrous sulfate, which contains 65 mg of elemental iron per tablet. You may take three tablets per day, which will provide 195 mg per day of elemental iron. Please monitor for constipation and you may want to start a stool softener such as colace when you start iron. Also, best to take iron supplement with a small glass of orange juice for absorption.   I would like to you have further lab done that evaluate your iron stores ( or ferritin). Please call the office and have these non fasting lab done PRIOR to starting iron in the next 2 weeks.   I would also like you to pick up stool cards from our office so we can test your stool and ensure NO blood loss from the gut. We will leave these at the front desk which you may return to [place] at your convenience in the next month ( as they will expire)  Please make follow up visit with me in 2 months to have your iron levels rechecked and we can discuss further treatment if not resolved.

## 2020-08-25 ENCOUNTER — Other Ambulatory Visit: Payer: Self-pay

## 2020-08-25 ENCOUNTER — Ambulatory Visit (INDEPENDENT_AMBULATORY_CARE_PROVIDER_SITE_OTHER): Payer: Medicare PPO

## 2020-08-25 DIAGNOSIS — E538 Deficiency of other specified B group vitamins: Secondary | ICD-10-CM | POA: Diagnosis not present

## 2020-08-25 MED ORDER — CYANOCOBALAMIN 1000 MCG/ML IJ SOLN
1000.0000 ug | Freq: Once | INTRAMUSCULAR | Status: AC
  Administered 2020-08-25: 1000 ug via INTRAMUSCULAR

## 2020-08-25 NOTE — Progress Notes (Signed)
Patient presented for B 12 injection to left deltoid, patient voiced no concerns nor showed any signs of distress during injection. 

## 2020-08-26 ENCOUNTER — Telehealth: Payer: Medicare PPO | Admitting: Family

## 2020-08-27 LAB — COLOGUARD
COLOGUARD: POSITIVE — AB
Cologuard: POSITIVE — AB

## 2020-08-28 ENCOUNTER — Other Ambulatory Visit (INDEPENDENT_AMBULATORY_CARE_PROVIDER_SITE_OTHER): Payer: Medicare PPO

## 2020-08-28 ENCOUNTER — Other Ambulatory Visit: Payer: Self-pay

## 2020-08-28 DIAGNOSIS — R899 Unspecified abnormal finding in specimens from other organs, systems and tissues: Secondary | ICD-10-CM | POA: Diagnosis not present

## 2020-08-29 LAB — UPEP/UIFE/LIGHT CHAINS/TP, 24-HR UR

## 2020-09-01 ENCOUNTER — Telehealth: Payer: Self-pay

## 2020-09-01 DIAGNOSIS — R195 Other fecal abnormalities: Secondary | ICD-10-CM

## 2020-09-01 LAB — UPEP/UIFE/LIGHT CHAINS/TP, 24-HR UR
% BETA, Urine: 0 %
ALBUMIN, U: 100 %
ALPHA 1 URINE: 0 %
ALPHA-2-GLOBULIN, U: 0 %
Free Kappa Lt Chains,Ur: 9.62 mg/L (ref 0.63–113.79)
Free Lambda Lt Chains,Ur: 1.6 mg/L (ref 0.47–11.77)
GAMMA GLOBULIN URINE: 0 %
Kappa/Lambda Ratio,U: 6.01 (ref 1.03–31.76)
Protein, 24H Urine: <60 mg/24 hr (ref 30–150)
Protein, Ur: <4 mg/dL

## 2020-09-01 NOTE — Telephone Encounter (Signed)
As of 08/27/20 patient has positive cologuard results. I have placed on your desk.

## 2020-09-01 NOTE — Telephone Encounter (Signed)
Spoke with pt She feels fine No rectal bleeding nor h/o  Hemorrhoid Okay with GI referral to positive colugard

## 2020-09-01 NOTE — Addendum Note (Signed)
Addended by: Burnard Hawthorne on: 09/01/2020 09:55 AM   Modules accepted: Orders

## 2020-09-02 ENCOUNTER — Other Ambulatory Visit: Payer: Self-pay

## 2020-09-02 ENCOUNTER — Encounter: Payer: Self-pay | Admitting: Nurse Practitioner

## 2020-09-02 ENCOUNTER — Ambulatory Visit (INDEPENDENT_AMBULATORY_CARE_PROVIDER_SITE_OTHER): Payer: Medicare PPO

## 2020-09-02 DIAGNOSIS — E538 Deficiency of other specified B group vitamins: Secondary | ICD-10-CM | POA: Diagnosis not present

## 2020-09-02 DIAGNOSIS — R5381 Other malaise: Secondary | ICD-10-CM | POA: Insufficient documentation

## 2020-09-02 MED ORDER — CYANOCOBALAMIN 1000 MCG/ML IJ SOLN
1000.0000 ug | Freq: Once | INTRAMUSCULAR | Status: AC
  Administered 2020-09-02: 1000 ug via INTRAMUSCULAR

## 2020-09-02 NOTE — Progress Notes (Signed)
Patient presented for B 12 injection to left deltoid, patient voiced no concerns nor showed any signs of distress during injection. 

## 2020-09-09 ENCOUNTER — Ambulatory Visit (INDEPENDENT_AMBULATORY_CARE_PROVIDER_SITE_OTHER): Payer: Medicare PPO

## 2020-09-09 ENCOUNTER — Other Ambulatory Visit: Payer: Self-pay

## 2020-09-09 DIAGNOSIS — E538 Deficiency of other specified B group vitamins: Secondary | ICD-10-CM

## 2020-09-09 MED ORDER — CYANOCOBALAMIN 1000 MCG/ML IJ SOLN
1000.0000 ug | Freq: Once | INTRAMUSCULAR | Status: AC
  Administered 2020-09-09: 1000 ug via INTRAMUSCULAR

## 2020-09-09 NOTE — Progress Notes (Signed)
Patient presented for B 12 injection to left deltoid, patient voiced no concerns nor showed any signs of distress during injection. 

## 2020-09-12 ENCOUNTER — Other Ambulatory Visit: Payer: Self-pay | Admitting: Family

## 2020-09-12 DIAGNOSIS — F325 Major depressive disorder, single episode, in full remission: Secondary | ICD-10-CM

## 2020-09-14 ENCOUNTER — Other Ambulatory Visit: Payer: Self-pay | Admitting: Family

## 2020-09-14 DIAGNOSIS — F325 Major depressive disorder, single episode, in full remission: Secondary | ICD-10-CM

## 2020-09-17 ENCOUNTER — Ambulatory Visit: Payer: Medicare PPO

## 2020-09-23 ENCOUNTER — Ambulatory Visit: Payer: Medicare PPO | Admitting: Nurse Practitioner

## 2020-09-23 ENCOUNTER — Encounter: Payer: Self-pay | Admitting: Nurse Practitioner

## 2020-09-23 VITALS — BP 140/72 | HR 76 | Ht 59.5 in | Wt 161.1 lb

## 2020-09-23 DIAGNOSIS — R195 Other fecal abnormalities: Secondary | ICD-10-CM | POA: Diagnosis not present

## 2020-09-23 DIAGNOSIS — D649 Anemia, unspecified: Secondary | ICD-10-CM

## 2020-09-23 DIAGNOSIS — K219 Gastro-esophageal reflux disease without esophagitis: Secondary | ICD-10-CM | POA: Diagnosis not present

## 2020-09-23 MED ORDER — PLENVU 140 G PO SOLR: 1.0000 | ORAL | 0 refills | Status: DC

## 2020-09-23 NOTE — Progress Notes (Signed)
ASSESSMENT AND PLAN    # 79 yo female referred for positive Cologuard study. No overt GI bleeding, bowel changes or weight loss. Last colonoscopy ~ 20 years ago.  --For further evaluation patient will be scheduled for colonoscopy. Patient will be scheduled for a colonoscopy. The risks and benefits of colonoscopy with possible polypectomy / biopsies were discussed and the patient agrees to proceed.   # Chronic GERD, asymptomatic on PPI   # Chronic [place] anemia (at least since January 2020) .  Iron studies are normal although ferritin is on the lower side at 18 so iron deficiency not excluded.  Hgb stable at 10.9 at last check early September.  She took oral iron for two months after Cologuard then went to donate blood like hgb was up to around 15. She does have a history of blood donation and  and takes oral iron three days prior to donating so hgb will be high enough. No black stools except when she takes iron. However she does take Aleve twice a week.  --It is unclear how long the anemia dates back since I only have labs dating back to January 2020.  Difficult to sort out anemia as she sometimes donates blood.  I do think it is reasonable to add on an EGD at time of colonoscopy since she require NSAIDs for arthritic pain. The risks and benefits of EGD were discussed and the patient agrees to proceed.    HISTORY OF PRESENT ILLNESS     Primary Gastroenterologist : [place]- Zenovia Jarred, MD  Chief Complaint : positive Cologuard  Claudia Andersen is a 79 y.o. female with PMH / [place] significant for,  but not necessarily limited to: arthritis, GERD, hx of PE, osteoporosis, mild diastolic dysfunction, idiopathic cardiomegaly , hysterectomy,   Patient is referred by PCP for positive Cologuard. No overt bleeding. No bowel chanages, no unexpected weight loss.  No [place] of colon cancer. Last colonoscopy was in [place] 20 years ago and reportedly normal.  Reviewing labs in epic patient has had normocytic anemia  dating back to January 2020.  Her anemia may go back further than that but no prior labs in epic.  Patient does donate blood.  She takes iron a few days prior to donating so that her hemoglobin will be acceptable.  Sometimes I have to check her hemoglobin on both hands to get acceptable hgb to qualify for donation. Following the Cologuard study patient says she also took iron for couple of months.  Following that she did go to donate and believes her hemoglobin was around 15   Other than when taking iron patient has not seen any black stools.  She takes Aleve a couple of times a week for arthritis.  Patient has a longstanding history of GERD.  She is asymptomatic on twice Protonix  Data Reviewed:  05/09/2020 CT angiography of the chest and CT scan of the abdomen and pelvis with contrast for left chest pain and elevated D-dimer --No acute findings   06/17/2020 WBC 7.2, hemoglobin 10.8, platelets 260.  Normal renal function, normal liver chemistries  07/20/2020 Ferritin 18.7, transferrin 221, 39% saturation folate and B12 normal. ,   Past Medical History:  Diagnosis Date  . Ankle fracture, bimalleolar, closed, right, initial encounter 10/26/2018   d/t fall 10/26/18  . Arthritis   . Back abrasion    CRUSHED VERTEBRAE  . Closed right radial fracture 10/26/2018   s/p a fall  . Compression of lumbar vertebra ([place])  L1 and L4 (from fall November 05, 2018)  . Depression   . Gallstones   . GERD (gastroesophageal reflux disease)   . Migraines   . Pulmonary emboli (HCC)    history of emboli     Past Surgical History:  Procedure Laterality Date  . ABDOMINAL HYSTERECTOMY    . CATARACT EXTRACTION W/PHACO Right 03/27/2018   Procedure: CATARACT EXTRACTION PHACO AND INTRAOCULAR LENS PLACEMENT (IOC);  Surgeon: Birder Robson, MD;  Location: [facility] ORS;  Service: Ophthalmology;  Laterality: Right;  [place] 00:33 AP% 15.3 CDE 5.18 Fluid pack lot # [id number]  . CATARACT EXTRACTION W/PHACO Left  06/26/2018   Procedure: CATARACT EXTRACTION PHACO AND INTRAOCULAR LENS PLACEMENT ([place]);  Surgeon: Birder Robson, MD;  Location: [facility] ORS;  Service: Ophthalmology;  Laterality: Left;  [place] 00:38 AP% 15.2 CDE 5.80 Fluid pack lot # [id number]  . FRACTURE SURGERY     RIGHT RADIAL  . 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 TOTAL KNEE ARTHROPLASTY Left   . TRIPLE SUBTALAR FUSION     Family History  Problem Relation Age of Onset  . Arthritis Mother   . Stroke Mother   . Heart disease Father   . Pneumonia Maternal Grandmother   . Heart disease Maternal Grandfather   . Heart disease Paternal Grandmother   . Heart disease Paternal Grandfather   . Breast cancer Neg Hx   . Colon cancer Neg Hx    Social History   Tobacco Use  . Smoking status: Former Smoker    Types: Cigarettes    Quit date: 1982    Years since quitting: 39.9  . Smokeless tobacco: Never Used  Vaping Use  . Vaping Use: Never used  Substance Use Topics  . Alcohol use: Yes    Comment: 5-7 glasses per week  . Drug use: Never   Current Outpatient Medications  Medication Sig Dispense Refill  . acetaminophen (TYLENOL) 500 MG tablet Take 2 tablets (1,000 mg total) by mouth every 8 (eight) hours as needed for mild pain, moderate pain, fever or headache. 50 tablet 0  . atorvastatin (LIPITOR) 10 MG tablet TAKE 1 TABLET BY MOUTH DAILY 90 tablet 0  . baclofen (LIORESAL) 10 MG tablet Take 0.5 tablets (5 mg total) by mouth 3 (three) times daily as needed for muscle spasms. 30 each 1  . Biotin 2500 MCG CAPS Take 2,500 mcg by mouth daily.    Marland Kitchen buPROPion (WELLBUTRIN XL) 150 MG 24 hr tablet TAKE ONE TABLET EACH MORNING WITH BREAKFAST 90 tablet 0  . Calcium Carb-Cholecalciferol (CALCIUM 600 + D PO) Take 2 tablets by mouth daily.     . diclofenac Sodium (VOLTAREN) 1 % GEL Apply 4 g  topically 4 (four) times daily. 100 g 1  . Fluticasone Furoate (FLONASE SENSIMIST NA) Place 1 spray into both nostrils daily as needed (allergies).     . gabapentin (NEURONTIN) 300 MG capsule Take 1 capsule (300 mg total) by mouth at bedtime. 90 capsule 3  . hydrochlorothiazide (MICROZIDE) 12.5 MG capsule Take 12.5 mg by mouth every other day.     . lidocaine (LIDODERM) 5 % Place 1 patch onto the skin every 12 (twelve) hours. Remove & Discard patch within 12 hours or as directed by MD 10 patch 0  . pantoprazole (PROTONIX) 40 MG tablet TAKE ONE TABLET BY MOUTH TWICE DAILY 180 tablet 2  .  traZODone (DESYREL) 50 MG tablet Take 1 tablet (50 mg total) by mouth at bedtime as needed for sleep. 90 tablet 1  . valACYclovir (VALTREX) 1000 MG tablet Take by mouth as needed.      No current facility-administered medications for this visit.   No Known Allergies   Review of Systems: Positive for arthritis.  All other systems reviewed and negative except where noted in HPI.   PHYSICAL EXAM :    Wt Readings from Last 3 Encounters:  09/23/20 161 lb 2 oz (73.1 kg)  08/21/20 174 lb (78.9 kg)  07/23/20 174 lb (78.9 kg)    BP 140/72 (BP Location: Left Arm, Patient Position: Sitting, Cuff Size: Normal)   Pulse 76   Ht 4' 11.5" (1.511 m) Comment: height measured without shoes  Wt 161 lb 2 oz (73.1 kg)   BMI 32.00 kg/m  Constitutional:  Pleasant female in no acute distress. Psychiatric: Normal mood and affect. Behavior is normal. EENT: Pupils normal.  Conjunctivae are normal. No scleral icterus. Neck supple.  Cardiovascular: Normal rate, regular rhythm. No edema Pulmonary/chest: Effort normal and breath sounds normal. No wheezing, rales or rhonchi. Abdominal: Soft, nondistended, nontender. Bowel sounds active throughout. There are no masses palpable. No hepatomegaly. Neurological: Alert and oriented to person place and time. Skin: Skin is warm and dry. No rashes noted.  Tye Savoy, NP   09/23/2020, 12:04 PM  Cc:  Referring Provider Arnett, Yvetta Coder, FNP

## 2020-09-23 NOTE — Patient Instructions (Signed)
If you are age 78 or older, your body mass index should be between 23-30. Your Body mass index is 32 kg/m. If this is out of the aforementioned range listed, please consider follow up with your Primary Care Provider.  If you are age 78 or younger, your body mass index should be between 19-25. Your Body mass index is 32 kg/m. If this is out of the aformentioned range listed, please consider follow up with your Primary Care Provider.   You have been scheduled for an endoscopy and colonoscopy. Please follow the written instructions given to you at your visit today. Please pick up your prep supplies at the pharmacy within the next 1-3 days. If you use inhalers (even only as needed), please bring them with you on the day of your procedure.  Follow up pending the results of your Colonoscopy and Endoscopy.  Thank you for entrusting me with your care and choosing [facility].  Tye Savoy, NP

## 2020-09-28 NOTE — Progress Notes (Signed)
Addendum: Reviewed and agree with assessment and management plan. Diedra Sinor M, MD  

## 2020-09-29 ENCOUNTER — Telehealth: Payer: Self-pay | Admitting: Nurse Practitioner

## 2020-09-29 NOTE — Telephone Encounter (Signed)
Patient calling to request a prep medication change from Plenvu to something else please advise

## 2020-09-30 ENCOUNTER — Ambulatory Visit: Payer: Medicare PPO | Admitting: Family

## 2020-09-30 ENCOUNTER — Telehealth: Payer: Self-pay | Admitting: Family

## 2020-09-30 ENCOUNTER — Encounter: Payer: Self-pay | Admitting: Family

## 2020-09-30 ENCOUNTER — Other Ambulatory Visit: Payer: Self-pay

## 2020-09-30 VITALS — BP 118/74 | HR 82 | Temp 97.6°F | Ht 59.5 in | Wt 158.4 lb

## 2020-09-30 DIAGNOSIS — R21 Rash and other nonspecific skin eruption: Secondary | ICD-10-CM

## 2020-09-30 DIAGNOSIS — D649 Anemia, unspecified: Secondary | ICD-10-CM

## 2020-09-30 DIAGNOSIS — F325 Major depressive disorder, single episode, in full remission: Secondary | ICD-10-CM

## 2020-09-30 DIAGNOSIS — I10 Essential (primary) hypertension: Secondary | ICD-10-CM

## 2020-09-30 DIAGNOSIS — L989 Disorder of the skin and subcutaneous tissue, unspecified: Secondary | ICD-10-CM | POA: Insufficient documentation

## 2020-09-30 DIAGNOSIS — E785 Hyperlipidemia, unspecified: Secondary | ICD-10-CM

## 2020-09-30 MED ORDER — MUPIROCIN 2 % EX OINT: 1.0000 "application " | TOPICAL_OINTMENT | Freq: Two times a day (BID) | CUTANEOUS | 2 refills | Status: DC

## 2020-09-30 MED ORDER — TRIAMCINOLONE ACETONIDE 0.025 % EX CREA: 1.0000 "application " | TOPICAL_CREAM | Freq: Two times a day (BID) | CUTANEOUS | 1 refills | Status: DC

## 2020-09-30 NOTE — Assessment & Plan Note (Signed)
Stable. Continue wellbutrin 150mg , trazodone 50mg .

## 2020-09-30 NOTE — Progress Notes (Signed)
Subjective:    Patient ID: Claudia Andersen, female    DOB: Sep 01, 1941, 79 y.o.   MRN: [id number]  CC: Claudia Andersen is a 79 y.o. female who presents today for follow up.   HPI: Complains of rash under nose, 2-3 weeks. Unchanged. Had been using Vicks Vapor rub and stopped 1 week ago.  Prurtic Used clearsil, cortizone OTC without relief.   Depression, irritability- compliant with wellbutrin 13m, trazodone 566m Consult with GI PaRoanna Raideror positive cologuard last week Chronic normocytic anemia, question IDA.   b12 low end normal. Has been doing 12 injections however didn't feel much benefit and decided to stop  HTN-Blood pressure at home in the '120's' . compliant with HCTZ 12.31m46mno cp.   HLD- compliant with lipitor 3m31mmpliant with iron once per day Donates blood every week to 10 days for last couple of years. Takes flinstone vitamin 3 days prior to donating blood.  Mother had hemachromatosis  Negative multiple myeloma screen last month  Mammogram utd HISTORY:  Past Medical History:  Diagnosis Date  . Ankle fracture, bimalleolar, closed, right, initial encounter 10/26/2018   d/t fall 10/26/18  . Arthritis   . Back abrasion    CRUSHED VERTEBRAE  . Closed right radial fracture 10/26/2018   s/p a fall  . Compression of lumbar vertebra (HCC)[place] L1 and L4 (from fall November 05, 2018)  . Depression   . Gallstones   . GERD (gastroesophageal reflux disease)   . Migraines   . Pulmonary emboli (HCC)    history of emboli   Past Surgical History:  Procedure Laterality Date  . ABDOMINAL HYSTERECTOMY    . CATARACT EXTRACTION W/PHACO Right 03/27/2018   Procedure: CATARACT EXTRACTION PHACO AND INTRAOCULAR LENS PLACEMENT (IOC);  Surgeon: PorfBirder Robson;  Location: [facility] ORS;  Service: Ophthalmology;  Laterality: Right;  [place] 0[place] AP% 15.3 CDE 5.18 Fluid pack lot # [id number]. CATARACT EXTRACTION W/PHACO Left 06/26/2018   Procedure: CATARACT EXTRACTION PHACO AND  INTRAOCULAR LENS PLACEMENT (IOC)[place]geon: PorfBirder Robson;  Location: [facility];  Service: Ophthalmology;  Laterality: Left;  [place] [id number] AP% 15.2 CDE 5.80 Fluid pack lot # [id number]. FRACTURE SURGERY     RIGHT RADIAL  . 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   Procedure: TOTAL HIP ARTHROPLASTY ANTERIOR APPROACH;  Surgeon: BoweLovell Sheehan;  Location: [facility] ORS;  Service: Orthopedics;  Laterality: Right;  . TOTAL KNEE ARTHROPLASTY Left   . TRIPLE SUBTALAR FUSION     Family History  Problem Relation Age of Onset  . Arthritis Mother   . Stroke Mother   . Heart disease Father   . Pneumonia Maternal Grandmother   . Heart disease Maternal Grandfather   . Heart disease Paternal Grandmother   . Heart disease Paternal Grandfather   . Breast cancer Neg Hx   . Colon cancer Neg Hx     Allergies: Patient has no known allergies. Current Outpatient Medications on File Prior to Visit  Medication Sig Dispense Refill  . acetaminophen (TYLENOL) 500 MG tablet Take 2 tablets (1,000 mg total) by mouth every 8 (eight) hours as needed for mild pain, moderate pain, fever or headache. 50 tablet 0  . atorvastatin (LIPITOR) 10 MG tablet TAKE 1 TABLET BY MOUTH DAILY 90 tablet 0  . baclofen (LIORESAL) 10 MG tablet Take 0.5 tablets (5 mg total) by mouth 3 (three) times daily as needed for muscle spasms. 30 each 1  .  Biotin 2500 MCG CAPS Take 2,500 mcg by mouth daily.    Marland Kitchen buPROPion (WELLBUTRIN XL) 150 MG 24 hr tablet TAKE ONE TABLET EACH MORNING WITH BREAKFAST 90 tablet 0  . Calcium Carb-Cholecalciferol (CALCIUM 600 + D PO) Take 2 tablets by mouth daily.     Marland Kitchen denosumab (PROLIA) 60 MG/ML SOSY injection Inject 60 mg into the skin every 6 (six) months.    . diclofenac Sodium (VOLTAREN) 1 % GEL Apply 4 g topically 4 (four) times daily. 100 g 1  . Fluticasone Furoate (FLONASE SENSIMIST NA) Place 1 spray into both nostrils daily as needed (allergies).     . hydrochlorothiazide (MICROZIDE) 12.5 MG capsule  Take 12.5 mg by mouth every other day.     . lidocaine (LIDODERM) 5 % Place 1 patch onto the skin every 12 (twelve) hours. Remove & Discard patch within 12 hours or as directed by MD 10 patch 0  . pantoprazole (PROTONIX) 40 MG tablet TAKE ONE TABLET BY MOUTH TWICE DAILY 180 tablet 2  . traZODone (DESYREL) 50 MG tablet Take 1 tablet (50 mg total) by mouth at bedtime as needed for sleep. 90 tablet 1  . valACYclovir (VALTREX) 1000 MG tablet Take by mouth as needed.     Marland Kitchen PEG-KCl-NaCl-NaSulf-Na Asc-C (PLENVU) 140 g SOLR Take 1 kit by mouth as directed. (Patient not taking: Reported on 09/30/2020) 1 each 0   No current facility-administered medications on file prior to visit.    Social History   Tobacco Use  . Smoking status: Former Smoker    Types: Cigarettes    Quit date: 1982    Years since quitting: 39.9  . Smokeless tobacco: Never Used  Vaping Use  . Vaping Use: Never used  Substance Use Topics  . Alcohol use: Yes    Comment: 5-7 glasses per week  . Drug use: Never    Review of Systems  Constitutional: Negative for chills, fatigue and fever.  Respiratory: Negative for cough and shortness of breath.   Cardiovascular: Negative for chest pain, palpitations and leg swelling.  Gastrointestinal: Negative for nausea and vomiting.  Skin: Positive for rash.  Psychiatric/Behavioral: Negative for sleep disturbance. The patient is not nervous/anxious.       Objective:    BP 118/74   Pulse 82   Temp 97.6 F (36.4 C)   Ht 4' 11.5" (1.511 m)   Wt 158 lb 6.4 oz (71.8 kg)   SpO2 99%   BMI 31.46 kg/m  BP Readings from Last 3 Encounters:  09/30/20 118/74  09/23/20 140/72  08/21/20 127/88   Wt Readings from Last 3 Encounters:  09/30/20 158 lb 6.4 oz (71.8 kg)  09/23/20 161 lb 2 oz (73.1 kg)  08/21/20 174 lb (78.9 kg)    Physical Exam Vitals reviewed.  Constitutional:      Appearance: She is well-developed and well-nourished.  HENT:     Head:      Comments: Dry excoriated  area noted under bilateral nares. No discrete lesion. Skin feels rough. Eyes:     Conjunctiva/sclera: Conjunctivae normal.  Cardiovascular:     Rate and Rhythm: Normal rate and regular rhythm.     Pulses: Normal pulses.     Heart sounds: Normal heart sounds.  Pulmonary:     Effort: Pulmonary effort is normal.     Breath sounds: Normal breath sounds. No wheezing, rhonchi or rales.  Skin:    General: Skin is warm and dry.  Neurological:     Mental Status:  She is alert.  Psychiatric:        Mood and Affect: Mood and affect normal.        Speech: Speech normal.        Behavior: Behavior normal.        Thought Content: Thought content normal.        Assessment & Plan:   Problem List Items Addressed This Visit      Cardiovascular and Mediastinum   HTN (hypertension)    Stable. Continue hctz 12.42m.        Musculoskeletal and Integument   Rash - Primary    Etiology unclear. Question dry skin or reaction from topical vicks vapor rub. Trial triamcinolone with mupirocin.       Relevant Medications   mupirocin ointment (BACTROBAN) 2 %   triamcinolone (KENALOG) 0.025 % cream     Other   Depression, major, single episode, complete remission (HCC)    Stable. Continue wellbutrin 1522m trazodone 5031m     HLD (hyperlipidemia)    Stable. Continue lipitor 49m11m   Normocytic anemia    Family h/o hemachromatosis. Have  Messaged LaurBeckey Rutterematology, as it relates to formal evaluation for. In this setting, advised ferrous sulfate no more than once daily. Pending egd/colonoscopy, will follow.             I have discontinued Claudia Andersen"'s gabapentin. I am also having her start on mupirocin ointment and triamcinolone. Additionally, I am having her maintain her Calcium Carb-Cholecalciferol (CALCIUM 600 + D PO), Fluticasone Furoate (FLONASE SENSIMIST NA), Biotin, lidocaine, acetaminophen, hydrochlorothiazide, diclofenac Sodium, traZODone, baclofen, pantoprazole,  atorvastatin, buPROPion, valACYclovir, Plenvu, and Prolia.   Meds ordered this encounter  Medications  . mupirocin ointment (BACTROBAN) 2 %    Sig: Apply 1 application topically 2 (two) times daily.    Dispense:  22 g    Refill:  2    Order Specific Question:   Supervising Provider    Answer:   TULLDerrel NipRESA L [[id number]]  . triamcinolone (KENALOG) 0.025 % cream    Sig: Apply 1 application topically 2 (two) times daily.    Dispense:  80 g    Refill:  1    Order Specific Question:   Supervising Provider    Answer:   TULLCrecencio [id number]]    Return precautions given.   Risks, benefits, and alternatives of the medications and treatment plan prescribed today were discussed, and patient expressed understanding.   Education regarding symptom management and diagnosis given to patient on AVS.  Continue to follow with ArneBurnard HawthorneP for routine health maintenance.   Claudia Andersen I agreed with plan.   MargMable ParisP

## 2020-09-30 NOTE — Assessment & Plan Note (Signed)
Etiology unclear. Question dry skin or reaction from topical vicks vapor rub. Trial triamcinolone with mupirocin.

## 2020-09-30 NOTE — Assessment & Plan Note (Signed)
Family h/o hemachromatosis. Have  Messaged Beckey Rutter , hematology, as it relates to formal evaluation for. In this setting, advised ferrous sulfate no more than once daily. Pending egd/colonoscopy, will follow.

## 2020-09-30 NOTE — Telephone Encounter (Signed)
LM on both patient's numbers to return our call.

## 2020-09-30 NOTE — Telephone Encounter (Signed)
-----   Message from Jacquelin Hawking, NP sent at 09/30/2020  3:04 PM EST ----- Yes definitely.   Crazy though.  Even folks with hemochromatosis we often do not do phlebotomies.frequent.  But then again I have only been in hematology/oncology for a little over 4 years.  Lol. I would say a referral is completely appropriate.  Faythe Casa, NP 09/30/2020 3:06 PM  ----- Message ----- From: Burnard Hawthorne, FNP Sent: 09/30/2020   1:39 PM EST To: Jacquelin Hawking, NP  Hi Anderson Malta,   Can I bother you with a question-   Patient of mine mother had hemachromatosis.   This patient has recent pos cologuard and is set up colo/edg. She is compliant with iron once per day The interesting piece is that she donates blood every week to 10 days for last couple of years... Takes flinstone vitamin 3 days prior to donating blood.   Possible that she may too have hemachromatosis but we havent picked up due to blood donation?  Do you rec a consult with yall for formal eval?  Thanks so much!  Joycelyn Schmid, [place] Cell [phone]

## 2020-09-30 NOTE — Patient Instructions (Addendum)
Trial bactroban and triamcinolone cream under nose. Let me know if rash doesn't resolve.   Happy Holidays!

## 2020-09-30 NOTE — Telephone Encounter (Signed)
Left voicemail for patient to return phone call 

## 2020-09-30 NOTE — Assessment & Plan Note (Signed)
Stable. Continue hctz 12.5mg .

## 2020-09-30 NOTE — Telephone Encounter (Signed)
Call pt Consulted with hematology and they felt formal eval for hemochromatosis very appropriate I have placed referral and marked not urgent Certainly if she would to push in mid Jan/ Feb I think that is reasonable. Let us know if you dont hear back within a week in regards to an appointment being scheduled.

## 2020-09-30 NOTE — Assessment & Plan Note (Signed)
Stable. Continue lipitor 10mg 

## 2020-10-01 MED ORDER — PEG 3350-KCL-NA BICARB-NACL 420 G PO SOLR: 4000.0000 mL | Freq: Once | ORAL | 0 refills | Status: AC

## 2020-10-01 NOTE — Telephone Encounter (Addendum)
Contacted patient, she stated she wanted to change her prep from Plenvu due to what she had read about the prep. She advised she wanted to use the prep that used to be used years ago that comes in a plastic jug. Golytely has been sent to the pharmacy and new prep instructions have been sent to the patient through My Chart. Patient has been advised.

## 2020-10-12 ENCOUNTER — Encounter: Payer: Self-pay | Admitting: Family

## 2020-10-12 NOTE — Telephone Encounter (Signed)
Please circle back here

## 2020-10-13 ENCOUNTER — Other Ambulatory Visit: Payer: Self-pay | Admitting: Family

## 2020-10-13 NOTE — Telephone Encounter (Signed)
Patient has already been set up with appointment for hematology.

## 2020-10-13 NOTE — Telephone Encounter (Signed)
LMTCB

## 2020-10-19 ENCOUNTER — Inpatient Hospital Stay: Payer: Medicare PPO | Admitting: Oncology

## 2020-10-19 ENCOUNTER — Inpatient Hospital Stay: Payer: Medicare PPO

## 2020-10-27 ENCOUNTER — Other Ambulatory Visit: Payer: Medicare PPO

## 2020-10-27 ENCOUNTER — Encounter: Payer: Medicare PPO | Admitting: Oncology

## 2020-11-03 ENCOUNTER — Encounter: Payer: Self-pay | Admitting: Family

## 2020-11-04 ENCOUNTER — Other Ambulatory Visit: Payer: Self-pay | Admitting: Family

## 2020-11-04 DIAGNOSIS — F325 Major depressive disorder, single episode, in full remission: Secondary | ICD-10-CM

## 2020-11-04 MED ORDER — BUPROPION HCL ER (XL) 300 MG PO TB24: 300.0000 mg | ORAL_TABLET | Freq: Every day | ORAL | 1 refills | Status: DC

## 2020-11-06 ENCOUNTER — Other Ambulatory Visit: Payer: Self-pay | Admitting: Family

## 2020-11-08 ENCOUNTER — Encounter: Payer: Self-pay | Admitting: Family

## 2020-11-09 ENCOUNTER — Other Ambulatory Visit: Payer: Self-pay | Admitting: Family

## 2020-11-09 DIAGNOSIS — M5137 Other intervertebral disc degeneration, lumbosacral region: Secondary | ICD-10-CM

## 2020-11-09 MED ORDER — GABAPENTIN 100 MG PO CAPS: 100.0000 mg | ORAL_CAPSULE | Freq: Every day | ORAL | 3 refills | Status: DC

## 2020-11-10 ENCOUNTER — Other Ambulatory Visit: Payer: Self-pay | Admitting: Family

## 2020-11-11 ENCOUNTER — Encounter: Payer: Self-pay | Admitting: Family

## 2020-11-11 ENCOUNTER — Encounter: Payer: Medicare PPO | Admitting: Oncology

## 2020-11-11 ENCOUNTER — Other Ambulatory Visit: Payer: Medicare PPO

## 2020-11-12 ENCOUNTER — Encounter: Payer: Medicare PPO | Admitting: Internal Medicine

## 2020-11-17 ENCOUNTER — Inpatient Hospital Stay: Payer: Medicare PPO | Attending: Oncology | Admitting: Oncology

## 2020-11-17 ENCOUNTER — Encounter: Payer: Self-pay | Admitting: Oncology

## 2020-11-17 ENCOUNTER — Inpatient Hospital Stay: Payer: Medicare PPO

## 2020-11-17 VITALS — BP 132/73 | HR 68 | Temp 98.0°F | Resp 18 | Ht 59.0 in | Wt 162.2 lb

## 2020-11-17 DIAGNOSIS — Z87891 Personal history of nicotine dependence: Secondary | ICD-10-CM | POA: Insufficient documentation

## 2020-11-17 DIAGNOSIS — D649 Anemia, unspecified: Secondary | ICD-10-CM | POA: Insufficient documentation

## 2020-11-17 DIAGNOSIS — Z8349 Family history of other endocrine, nutritional and metabolic diseases: Secondary | ICD-10-CM | POA: Diagnosis not present

## 2020-11-17 LAB — CBC WITH DIFFERENTIAL/PLATELET
Abs Immature Granulocytes: 0.04 10*3/uL (ref 0.00–0.07)
Basophils Absolute: 0.1 10*3/uL (ref 0.0–0.1)
Basophils Relative: 1 %
Eosinophils Absolute: 0.2 10*3/uL (ref 0.0–0.5)
Eosinophils Relative: 3 %
HCT: 35.3 % — ABNORMAL LOW (ref 36.0–46.0)
Hemoglobin: 12.1 g/dL (ref 12.0–15.0)
Immature Granulocytes: 1 %
Lymphocytes Relative: 18 %
Lymphs Abs: 1.5 10*3/uL (ref 0.7–4.0)
MCH: 33.5 pg (ref 26.0–34.0)
MCHC: 34.3 g/dL (ref 30.0–36.0)
MCV: 97.8 fL (ref 80.0–100.0)
Monocytes Absolute: 0.8 10*3/uL (ref 0.1–1.0)
Monocytes Relative: 10 %
Neutro Abs: 5.5 10*3/uL (ref 1.7–7.7)
Neutrophils Relative %: 67 %
Platelets: 278 10*3/uL (ref 150–400)
RBC: 3.61 MIL/uL — ABNORMAL LOW (ref 3.87–5.11)
RDW: 13.2 % (ref 11.5–15.5)
WBC: 8.2 10*3/uL (ref 4.0–10.5)
nRBC: 0 % (ref 0.0–0.2)

## 2020-11-17 LAB — COMPREHENSIVE METABOLIC PANEL
ALT: 16 U/L (ref 0–44)
AST: 22 U/L (ref 15–41)
Albumin: 4.2 g/dL (ref 3.5–5.0)
Alkaline Phosphatase: 53 U/L (ref 38–126)
Anion gap: 11 (ref 5–15)
BUN: 21 mg/dL (ref 8–23)
CO2: 27 mmol/L (ref 22–32)
Calcium: 9.3 mg/dL (ref 8.9–10.3)
Chloride: 103 mmol/L (ref 98–111)
Creatinine, Ser: 0.97 mg/dL (ref 0.44–1.00)
GFR, Estimated: 60 mL/min — ABNORMAL LOW (ref >60)
Glucose, Bld: 83 mg/dL (ref 70–99)
Potassium: 4.3 mmol/L (ref 3.5–5.1)
Sodium: 141 mmol/L (ref 135–145)
Total Bilirubin: 0.7 mg/dL (ref 0.3–1.2)
Total Protein: 7 g/dL (ref 6.5–8.1)

## 2020-11-17 LAB — TECHNOLOGIST SMEAR REVIEW
Plt Morphology: NORMAL
RBC Morphology: NORMAL
WBC Morphology: NORMAL

## 2020-11-17 LAB — RETIC PANEL
Immature Retic Fract: 9.3 % (ref 2.3–15.9)
RBC.: 3.58 MIL/uL — ABNORMAL LOW (ref 3.87–5.11)
Retic Count, Absolute: 79.1 10*3/uL (ref 19.0–186.0)
Retic Ct Pct: 2.2 % (ref 0.4–3.1)
Reticulocyte Hemoglobin: 34.9 pg (ref >27.9)

## 2020-11-17 NOTE — Progress Notes (Signed)
Hematology/Oncology Consult note [facility] Telephone:([phone] [phone]   Patient Care Team: Burnard Hawthorne, FNP as PCP - General (Family Medicine)  REFERRING PROVIDER: Burnard Hawthorne, FNP  CHIEF COMPLAINTS/REASON FOR VISIT:  Evaluation of normocytic anemia  HISTORY OF PRESENTING ILLNESS:   Claudia Andersen is a  80 y.o.  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  She also donates platelet  on monthly basis.  Family history of hemochromatosis in her mom who needs frequent phlebotomy. Previous work-up showed normal vitamin B12 and folate level, positive Cologuard for which she is going to have a colonoscopy work-up.  Iron panel is not consistent with iron deficiency.  Multiple myeloma panel showed negative M protein.  Slightly increase free light chain ratio, urine protein electrophoresis is unremarkable.  No monoclonal protein in urine. Patient denies any constitutional symptoms.  Denies any family history of cancer.  Denies any change of bowel habit, bloody or black stool  Review of Systems  Constitutional: Negative for appetite change, chills, fatigue and fever.  HENT:   Negative for hearing loss and voice change.   Eyes: Negative for eye problems.  Respiratory: Negative for chest tightness and cough.   Cardiovascular: Negative for chest pain.  Gastrointestinal: Negative for abdominal distention, abdominal pain and blood in stool.  Endocrine: Negative for hot flashes.  Genitourinary: Negative for difficulty urinating and frequency.   Musculoskeletal: Negative for arthralgias.  Skin: Negative for itching and rash.  Neurological: Negative for extremity weakness.  Hematological: Negative for adenopathy.  Psychiatric/Behavioral:  Negative for confusion.    MEDICAL HISTORY:  Past Medical History:  Diagnosis Date  . Ankle fracture, bimalleolar, closed, right, initial encounter 10/26/2018   d/t fall 10/26/18  . Arthritis   . Back abrasion    CRUSHED VERTEBRAE  . Closed right radial fracture 10/26/2018   s/p a fall  . Compression of lumbar vertebra ([place])    L1 and L4 (from fall November 05, 2018)  . Depression   . Gallstones   . GERD (gastroesophageal reflux disease)   . Migraines   . Pulmonary emboli (HCC)    history of emboli    SURGICAL HISTORY: Past Surgical History:  Procedure Laterality Date  . ABDOMINAL HYSTERECTOMY    . CATARACT EXTRACTION W/PHACO Right 03/27/2018   Procedure: CATARACT EXTRACTION PHACO AND INTRAOCULAR LENS PLACEMENT (I[facility]);  Surgeon: Birder Robson, MD;  Location: [facility] ORS;  Service: Ophthalmology;  Laterality: Right;  [place] 00:33 AP% 15.3 CDE 5.18 Fluid pack lot # [id number]  . CATARACT EXTRACTION W/PHACO Left 06/26/2018   Procedure: CATARACT EXTRACTION PHACO AND INTRAOCULAR LENS PLACEMENT ([place]);  Surgeon: Birder Robson, MD;  Location: [facility] ORS;  Service: Ophthalmology;  Laterality: Left;  [place] 00:38 AP% 15.2 CDE 5.80 Fluid pack lot # [id number]  . FRACTURE SURGERY     RIGHT RADIAL  . 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 TOTAL KNEE ARTHROPLASTY Left   . TRIPLE SUBTALAR FUSION      SOCIAL HISTORY: Social History   Socioeconomic History  . Marital status: Divorced    Spouse name: Not on file  . Number of children: 1  .  Years of education: Not on file  . Highest education level: Not on file  Occupational History  . Occupation: retired  Tobacco Use  . Smoking status: Former Smoker    Packs/day: 0.50    Years: 22.00    Pack years: 11.00    Types: Cigarettes    Quit date: 1982    Years since quitting: 40.1  .  Smokeless tobacco: Never Used  Vaping Use  . Vaping Use: Never used  Substance and Sexual Activity  . Alcohol use: Yes    Comment: 5-7 glasses per week  . Drug use: Never  . Sexual activity: Not on file  Other Topics Concern  . 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    Lives at [place] Strain: Not on file  Food Insecurity: Not on file  Transportation Needs: Not on file  Physical Activity: Not on file  Stress: Not on file  Social Connections: Not on file  Intimate Partner Violence: Not on file    FAMILY HISTORY: Family History  Problem Relation Age of Onset  . Arthritis Mother   . Stroke Mother   . Hemachromatosis Mother   . Heart disease Father   . Pneumonia Maternal Grandmother   . Heart disease Maternal Grandfather   . Heart disease Paternal Grandmother   . Heart disease Paternal Grandfather   . Breast cancer Neg Hx   . Colon cancer Neg Hx     ALLERGIES:  has No Known Allergies.  MEDICATIONS:  Current Outpatient Medications  Medication Sig Dispense Refill  . acetaminophen (TYLENOL) 500 MG tablet Take 2 tablets (1,000 mg total) by mouth every 8 (eight) hours as needed for mild pain, moderate pain, fever or headache. 50 tablet 0  . atorvastatin (LIPITOR) 10 MG tablet TAKE 1 TABLET BY MOUTH DAILY 90 tablet 0  . baclofen (LIORESAL) 10 MG tablet Take 0.5 tablets (5 mg total) by mouth 3 (three) times daily as needed for muscle spasms. 30 each 1  . Biotin 2500 MCG CAPS Take 2,500 mcg by mouth daily.    Marland Kitchen buPROPion (WELLBUTRIN XL) 300 MG 24 hr tablet Take 1 tablet (300 mg total) by mouth daily. 90 tablet 1  . Calcium Carb-Cholecalciferol (CALCIUM 600 + D PO) Take 2 tablets by mouth daily.     Marland Kitchen denosumab (PROLIA) 60 MG/ML SOSY injection Inject 60 mg into the skin every 6 (six) months.    . diclofenac Sodium (VOLTAREN) 1 % GEL Apply 4 g topically 4 (four) times daily. 100 g 1   . Fluticasone Furoate (FLONASE SENSIMIST NA) Place 1 spray into both nostrils daily as needed (allergies).     . gabapentin (NEURONTIN) 300 MG capsule at bedtime.    . hydrochlorothiazide (MICROZIDE) 12.5 MG capsule Take 12.5 mg by mouth every other day.     . lidocaine (LIDODERM) 5 % Place 1 patch onto the skin every 12 (twelve) hours. Remove & Discard patch within 12 hours or as directed by MD 10 patch 0  . mupirocin ointment (BACTROBAN) 2 % Apply 1 application topically 2 (two) times daily. 22 g 2  . pantoprazole (PROTONIX) 40 MG tablet TAKE ONE TABLET BY MOUTH TWICE DAILY 180 tablet 2  . traZODone (DESYREL) 50 MG tablet TAKE 1 TABLET BY MOUTH NIGHTLY AS NEEDEDFOR SLEEP 90 tablet 1  . triamcinolone (KENALOG) 0.025 % cream Apply 1 application  topically 2 (two) times daily. 80 g 1  . gabapentin (NEURONTIN) 100 MG capsule Take 1 capsule (100 mg total) by mouth at bedtime. (Patient not taking: Reported on 11/17/2020) 90 capsule 3  . valACYclovir (VALTREX) 1000 MG tablet TAKE TWO (2) TABLETS BY MOUTH EVERY 12 HOURS FOR 1 DAY **START AS SOON AS POSSIBLE AFTER SYMPTOM ONSET** (Patient not taking: Reported on 11/17/2020) 6 tablet 1   No current facility-administered medications for this visit.     PHYSICAL EXAMINATION: ECOG PERFORMANCE STATUS: 0 - Asymptomatic Vitals:   11/17/20 1125  BP: 132/73  Pulse: 68  Resp: 18  Temp: 98 F (36.7 C)   Filed Weights   11/17/20 1125  Weight: 162 lb 3.2 oz (73.6 kg)    Physical Exam Constitutional:      General: She is not in acute distress. HENT:     Head: Normocephalic and atraumatic.  Eyes:     General: No scleral icterus. Cardiovascular:     Rate and Rhythm: Normal rate and regular rhythm.     Heart sounds: Normal heart sounds.  Pulmonary:     Effort: Pulmonary effort is normal. No respiratory distress.     Breath sounds: No wheezing.  Abdominal:     General: Bowel sounds are normal. There is no distension.     Palpations: Abdomen is soft.   Musculoskeletal:        General: No deformity. Normal range of motion.     Cervical back: Normal range of motion and neck supple.  Skin:    General: Skin is warm and dry.     Findings: No erythema or rash.  Neurological:     Mental Status: She is alert and oriented to person, place, and time. Mental status is at baseline.     Cranial Nerves: No cranial nerve deficit.     Coordination: Coordination normal.  Psychiatric:        Mood and Affect: Mood normal.     LABORATORY DATA:  I have reviewed the data as listed Lab Results  Component Value Date   WBC 8.2 11/17/2020   HGB 12.1 11/17/2020   HCT 35.3 (L) 11/17/2020   MCV 97.8 11/17/2020   PLT 278 11/17/2020   Recent Labs    05/08/20 1836 06/17/20 1647 07/30/20 1034 11/17/20 1156  NA 138 138  --  141  K 4.0 4.2  --  4.3  CL 104 103  --  103  CO2 25 28  --  27  GLUCOSE 127* 107*  --  83  BUN 20 17  --  21  CREATININE 0.92 0.93  --  0.97  CALCIUM 8.7* 9.0  --  9.3  GFRNONAA >60  --   --  60*  GFRAA >60  --   --   --   PROT 6.9 6.5 6.8 7.0  ALBUMIN 4.0 4.2  --  4.2  AST 20 17  --  22  ALT 17 12  --  16  ALKPHOS 69 69  --  53  BILITOT 0.5 0.3  --  0.7   Iron/TIBC/Ferritin/ %Sat    Component Value Date/Time   IRON 108 07/30/2020 1034   FERRITIN 18.7 07/30/2020 1034   IRONPCTSAT 34.9 07/30/2020 1034      RADIOGRAPHIC STUDIES: I have personally reviewed the radiological images as listed and agreed with the findings in the report. No results found.    ASSESSMENT & PLAN:  1. Family history of hemochromatosis   2. Normocytic anemia    #  Normocytic anemia, Recent iron panel is not consistent with iron deficiency. Check CBC, CMP today.  Check smear.  Check reticulocyte panel. Wonder if anemia secondary to blood donation.   #Family history of hemochromatosis. Check hemochromatosis screening PCR.   Orders Placed This Encounter  Procedures  . CBC with Differential/Platelet    Standing Status:   Future     Number of Occurrences:   1    Standing Expiration Date:   11/17/2021  . Technologist smear review    Standing Status:   Future    Number of Occurrences:   1    Standing Expiration Date:   11/17/2021  . Retic Panel    Standing Status:   Future    Number of Occurrences:   1    Standing Expiration Date:   11/17/2021  . Hemochromatosis DNA-PCR(c282y,h63d)    Standing Status:   Future    Number of Occurrences:   1    Standing Expiration Date:   11/17/2021  . Comprehensive metabolic panel    Standing Status:   Future    Number of Occurrences:   1    Standing Expiration Date:   11/17/2021    All questions were answered. The patient knows to call the clinic with any problems questions or concerns.  cc Burnard Hawthorne, FNP    Return of visit: 2 weeks to go over blood work. Thank you for this kind referral and the opportunity to participate in the care of this patient. A copy of today's note is routed to referring provider    Earlie Server, MD, PhD Hematology Oncology [facility] Pager- [phone] 11/17/2020\

## 2020-11-17 NOTE — Progress Notes (Signed)
Patient here to establish care for normocytic anemia.Marland Kitchen

## 2020-11-20 LAB — HEMOCHROMATOSIS DNA-PCR(C282Y,H63D)

## 2020-11-24 NOTE — Telephone Encounter (Signed)
Pt is still having back and has become more sever

## 2020-11-29 ENCOUNTER — Encounter: Payer: Self-pay | Admitting: Family

## 2020-11-30 NOTE — Telephone Encounter (Signed)
Spoken to patient. She stated she is not dizzy or lightheaded. She stated she is having balance issues and has been catching herself right before falling. She has not fallen. She does not have blurry vision, jaw px, SOB, heart palpitations, chest px, and dizziness. As for her back px she stated it is the same as it always is, her back pain has flared up again. appointment has been schedule for 12-02-20.

## 2020-12-01 ENCOUNTER — Inpatient Hospital Stay: Payer: Medicare PPO | Admitting: Oncology

## 2020-12-01 NOTE — Progress Notes (Signed)
Multiple unsuccessful attempts at contacting patient for virtual visit with Dr. Tasia Catchings today.

## 2020-12-02 ENCOUNTER — Ambulatory Visit: Payer: Medicare PPO | Admitting: Family

## 2020-12-02 DIAGNOSIS — Z0289 Encounter for other administrative examinations: Secondary | ICD-10-CM

## 2020-12-04 ENCOUNTER — Ambulatory Visit (AMBULATORY_SURGERY_CENTER): Payer: Self-pay

## 2020-12-04 ENCOUNTER — Other Ambulatory Visit: Payer: Self-pay

## 2020-12-04 VITALS — Ht 59.0 in | Wt 160.0 lb

## 2020-12-04 DIAGNOSIS — D649 Anemia, unspecified: Secondary | ICD-10-CM

## 2020-12-04 DIAGNOSIS — K219 Gastro-esophageal reflux disease without esophagitis: Secondary | ICD-10-CM

## 2020-12-04 DIAGNOSIS — R195 Other fecal abnormalities: Secondary | ICD-10-CM

## 2020-12-04 NOTE — Progress Notes (Signed)
No egg or soy allergy known to patient  No issues with past sedation with any surgeries or procedures No intubation problems in the past  No FH of Malignant Hyperthermia No diet pills per patient No home 02 use per patient  No blood thinners per patient  Pt denies issues with constipation  No A fib or A flutter  EMMI video via MyChart  COVID 19 guidelines implemented in PV today with Pt and RN  Pt is fully vaccinated for Covid x 2 + booster= Pt denies loose or missing teeth, denies dentures, partials,  capped or bonded teeth;  Patient reports LEFT LOWER dental implant; Coupon given to pt in PV today , Code to Pharmacy and  NO PA's for preps discussed with pt In PV today  Discussed with pt there will be an out-of-pocket cost for prep and that varies from $0 to 70 dollars  Due to the COVID-19 pandemic we are asking patients to follow certain guidelines.  Pt aware of COVID protocols and [place] guidelines   Patient reports she already has the golytely prep at home (in my closet) -RX not sent at [facility]

## 2020-12-07 ENCOUNTER — Other Ambulatory Visit: Payer: Self-pay

## 2020-12-07 ENCOUNTER — Ambulatory Visit: Payer: Medicare PPO | Admitting: Family

## 2020-12-07 ENCOUNTER — Encounter: Payer: Self-pay | Admitting: Family

## 2020-12-07 DIAGNOSIS — F325 Major depressive disorder, single episode, in full remission: Secondary | ICD-10-CM | POA: Diagnosis not present

## 2020-12-07 DIAGNOSIS — R109 Unspecified abdominal pain: Secondary | ICD-10-CM

## 2020-12-07 DIAGNOSIS — I10 Essential (primary) hypertension: Secondary | ICD-10-CM

## 2020-12-07 DIAGNOSIS — E785 Hyperlipidemia, unspecified: Secondary | ICD-10-CM

## 2020-12-07 DIAGNOSIS — R10A2 Flank pain, left side: Secondary | ICD-10-CM

## 2020-12-07 DIAGNOSIS — G4700 Insomnia, unspecified: Secondary | ICD-10-CM

## 2020-12-07 DIAGNOSIS — M545 Low back pain, unspecified: Secondary | ICD-10-CM | POA: Diagnosis not present

## 2020-12-07 DIAGNOSIS — G8929 Other chronic pain: Secondary | ICD-10-CM

## 2020-12-07 DIAGNOSIS — R29898 Other symptoms and signs involving the musculoskeletal system: Secondary | ICD-10-CM

## 2020-12-07 NOTE — Assessment & Plan Note (Addendum)
Suspect largely related to deconditioning. Patient used her arms to get out of chair. Gait is stable as we walked up and down the hallway. No reported dizziness. Medications do not seem to be causing dizziness as we have reviewed medication together today.  She does have slight limp. She declines PT referral as she would prefer to start with exercise classes. Close follow up.

## 2020-12-07 NOTE — Assessment & Plan Note (Signed)
Chronic. Overall controlled with prn use baclofen, lidocaine patch prn, gabapentin 300mg  qhs. Regimen is not sedating for patient, will continue.

## 2020-12-07 NOTE — Patient Instructions (Addendum)
Start HCTZ 12.5mg  daily for blood pressure control. Goal less than 130/80  HOLD lipitor for 2 weeks. If muscle aches resolve, please call and let me know.   Compression stockings for legs Increase water intake Careful with position changes  Trial of conservative therapy for left sided rib pain. Heat, voltaren gel; let me know if not better.

## 2020-12-07 NOTE — Assessment & Plan Note (Signed)
Focal tenderness. Suspect muscular strain. We agreed conservative management to start. She will let me know how she is doing

## 2020-12-07 NOTE — Assessment & Plan Note (Signed)
Elevated. No orthostatic hypotension. Advised daily use of 12.5mg  hctz . Close follow up in one month. Will get repeat BMP.

## 2020-12-07 NOTE — Assessment & Plan Note (Signed)
Controlled. Continue trazodone 50mg 

## 2020-12-07 NOTE — Assessment & Plan Note (Signed)
Controlled on wellbutrin 300mg , continue

## 2020-12-07 NOTE — Assessment & Plan Note (Signed)
Trial stop crestor for 2 weeks to see if causing myalgia. Close follow up.

## 2020-12-07 NOTE — Progress Notes (Signed)
Subjective:    Patient ID: Claudia Andersen, female    DOB: [birth year], 80 y.o.   MRN: [id number]  CC: Claudia Andersen is a 80 y.o. female who presents today for follow up.   HPI: She complains of balance problems 1- 2 weeks.  She  Dizziness when she is walking and turns quickly; otherwise no dizziness. Denies syncope,  HA, cp, sob.  Very little strength in legs. She plans to start exercise classes at [facility].   Left posterior 'rib pain' for 3 weeks, comes and goes. Pain is not associated with eating, bending, turning. No injury, dysuria, hematuria. She feels the pain superficial on skin and when she presses into area.   NO numbness Iegs. NO falls.   All over cramping in arms, legs. Compliant with lipitor.   Takes hctz prn for leg swelling. No leg swelling today. Drinking diet tonic with lime three 10 ounces per day. No h/o heart failure.   Low back pain- takes half tablet of baclofen prn. Not excessively drowsy. Takes gabapentin 300mg  qhs.   Insomnia- compliant with trazodone 50mg .  Depression- feels well on wellbutrin 300mg    H/o left knee replacement, right hip replacement.    HISTORY:  Past Medical History:  Diagnosis Date  . Ankle fracture, bimalleolar, closed, right, initial encounter 10/26/2018   d/t fall 10/26/18  . Arthritis    generalized  . Back abrasion    CRUSHED VERTEBRAE  . Cataract    bilateral sx   . Closed right radial fracture 10/26/2018   s/p a fall  . Compression of lumbar vertebra ([place])    L1 and L4 (from fall November 05, 2018)  . Depression    hx of-on meds  . Gallstones   . GERD (gastroesophageal reflux disease)    on meds  . Migraines   . Osteoporosis    on Prolia inj  . Pulmonary emboli (HCC)    history of emboli   Past Surgical History:  Procedure Laterality Date  . ABDOMINAL HYSTERECTOMY    . APPENDECTOMY    . CATARACT EXTRACTION W/PHACO Right 03/27/2018   Procedure: CATARACT EXTRACTION PHACO AND INTRAOCULAR LENS PLACEMENT (IOC);   Surgeon: Birder Robson, MD;  Location: [facility] ORS;  Service: Ophthalmology;  Laterality: Right;  [place] 00:33 AP% 15.3 CDE 5.18 Fluid pack lot # [id number]  . CATARACT EXTRACTION W/PHACO Left 06/26/2018   Procedure: CATARACT EXTRACTION PHACO AND INTRAOCULAR LENS PLACEMENT ([place]);  Surgeon: Birder Robson, MD;  Location: [facility] ORS;  Service: Ophthalmology;  Laterality: Left;  [place] 00:38 AP% 15.2 CDE 5.80 Fluid pack lot # [id number]  . FRACTURE SURGERY Right    RIGHT RADIAL wrist  . TONSILLECTOMY AND ADENOIDECTOMY    . 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 TOTAL KNEE ARTHROPLASTY Left   . TRIPLE SUBTALAR FUSION    . WISDOM TOOTH EXTRACTION     Family History  Problem Relation Age of Onset  . Arthritis Mother   . Stroke Mother   . Hemachromatosis Mother   . Heart disease Father   . Pneumonia Maternal Grandmother   . Heart disease Maternal Grandfather   . Heart disease Paternal Grandmother   . Heart disease Paternal Grandfather   . Breast cancer Neg Hx   . Colon cancer Neg Hx   . Colon polyps Neg Hx   . Esophageal cancer Neg  Hx   . Rectal cancer Neg Hx   . Stomach cancer Neg Hx     Allergies: Patient has no known allergies. Current Outpatient Medications on File Prior to Visit  Medication Sig Dispense Refill  . acetaminophen (TYLENOL) 500 MG tablet Take 2 tablets (1,000 mg total) by mouth every 8 (eight) hours as needed for mild pain, moderate pain, fever or headache. 50 tablet 0  . atorvastatin (LIPITOR) 10 MG tablet TAKE 1 TABLET BY MOUTH DAILY 90 tablet 0  . baclofen (LIORESAL) 10 MG tablet Take 0.5 tablets (5 mg total) by mouth 3 (three) times daily as needed for muscle spasms. 30 each 1  . Biotin 2500 MCG CAPS Take 2,500 mcg by mouth daily.    Marland Kitchen buPROPion (WELLBUTRIN XL) 300 MG 24 hr tablet Take 1 tablet (300 mg total) by mouth  daily. 90 tablet 1  . Calcium Carb-Cholecalciferol (CALCIUM 600 + D PO) Take 2 tablets by mouth daily.     . COLLAGEN PO Take 3 tablets by mouth in the morning and at bedtime.    Marland Kitchen denosumab (PROLIA) 60 MG/ML SOSY injection Inject 60 mg into the skin every 6 (six) months.    . diclofenac Sodium (VOLTAREN) 1 % GEL Apply 4 g topically 4 (four) times daily. (Patient taking differently: Apply 4 g topically 4 (four) times daily as needed.) 100 g 1  . Fluticasone Furoate (FLONASE SENSIMIST NA) Place 1 spray into both nostrils daily as needed (allergies).     . gabapentin (NEURONTIN) 300 MG capsule at bedtime.    . hydrochlorothiazide (MICROZIDE) 12.5 MG capsule Take 12.5 mg by mouth daily as needed.    . lidocaine (LIDODERM) 5 % Place 1 patch onto the skin every 12 (twelve) hours. Remove & Discard patch within 12 hours or as directed by MD 10 patch 0  . pantoprazole (PROTONIX) 40 MG tablet TAKE ONE TABLET BY MOUTH TWICE DAILY 180 tablet 2  . PREVIDENT 5000 PLUS 1.1 % CREA dental cream Take by mouth in the morning and at bedtime.    . traZODone (DESYREL) 50 MG tablet TAKE 1 TABLET BY MOUTH NIGHTLY AS NEEDEDFOR SLEEP 90 tablet 1  . triamcinolone (KENALOG) 0.025 % cream Apply 1 application topically 2 (two) times daily. 80 g 1  . valACYclovir (VALTREX) 1000 MG tablet TAKE TWO (2) TABLETS BY MOUTH EVERY 12 HOURS FOR 1 DAY **START AS SOON AS POSSIBLE AFTER SYMPTOM ONSET** 6 tablet 1   No current facility-administered medications on file prior to visit.    Social History   Tobacco Use  . Smoking status: Former Smoker    Packs/day: 0.50    Years: 22.00    Pack years: 11.00    Types: Cigarettes    Quit date: 1982    Years since quitting: 40.1  . Smokeless tobacco: Never Used  Vaping Use  . Vaping Use: Never used  Substance Use Topics  . Alcohol use: Yes    Alcohol/week: 10.0 standard drinks    Types: 10 Standard drinks or equivalent per week  . Drug use: Never    Review of Systems   Constitutional: Negative for chills and fever.  HENT: Negative for congestion.   Eyes: Negative for visual disturbance.  Respiratory: Negative for cough, shortness of breath and wheezing.   Cardiovascular: Negative for chest pain, palpitations and leg swelling.  Gastrointestinal: Negative for nausea and vomiting.  Genitourinary: Negative for dysuria and hematuria.  Musculoskeletal: Positive for back pain.  Neurological: Positive for dizziness  and weakness (lower extremity). Negative for syncope, numbness and headaches.  Psychiatric/Behavioral: Negative for sleep disturbance (controlled).      Objective:    BP 110/70   Pulse 75   Temp 97.8 F (36.6 C)   Ht 4' 11.02" (1.499 m)   Wt 161 lb 12.8 oz (73.4 kg)   SpO2 97%   BMI 32.66 kg/m  BP Readings from Last 3 Encounters:  12/07/20 110/70  11/17/20 132/73  09/30/20 118/74   Wt Readings from Last 3 Encounters:  12/07/20 161 lb 12.8 oz (73.4 kg)  12/04/20 160 lb (72.6 kg)  11/17/20 162 lb 3.2 oz (73.6 kg)    Physical Exam Vitals reviewed.  Constitutional:      Appearance: She is well-developed and well-nourished.  Eyes:     Conjunctiva/sclera: Conjunctivae normal.  Cardiovascular:     Rate and Rhythm: Normal rate and regular rhythm.     Pulses: Normal pulses.     Heart sounds: Normal heart sounds.  Pulmonary:     Effort: Pulmonary effort is normal.     Breath sounds: Normal breath sounds. No wheezing, rhonchi or rales.  Musculoskeletal:       Arms:     Right lower leg: No edema.     Left lower leg: No edema.     Comments: Left lateral side pain proximal to 10th rib pain with deep palpation. No rash, mass appreciated. No erythema, gross deformity. No pain with deep inspiration.  Negative single leg raise bilaterally.Sensation intact bilaterally.  Skin:    General: Skin is warm and dry.  Neurological:     Mental Status: She is alert.  Psychiatric:        Mood and Affect: Mood and affect normal.        Speech:  Speech normal.        Behavior: Behavior normal.        Thought Content: Thought content normal.        Assessment & Plan:   Problem List Items Addressed This Visit      Cardiovascular and Mediastinum   HTN (hypertension)    Elevated. No orthostatic hypotension. Advised daily use of 12.5mg  hctz . Close follow up in one month. Will get repeat BMP.         Other   Depression, major, single episode, complete remission ([place])    Controlled on wellbutrin 300mg , continue      HLD (hyperlipidemia)    Trial stop crestor for 2 weeks to see if causing myalgia. Close follow up.       Insomnia    Controlled. Continue trazodone 50mg       Left flank pain    Focal tenderness. Suspect muscular strain. We agreed conservative management to start. She will let me know how she is doing      Low back pain    Chronic. Overall controlled with prn use baclofen, lidocaine patch prn, gabapentin 300mg  qhs. Regimen is not sedating for patient, will continue.       Lower extremity weakness    Suspect largely related to deconditioning. Patient used her arms to get out of chair. Gait is stable as we walked up and down the hallway. No reported dizziness. Medications do not seem to be causing dizziness as we have reviewed medication together today.  She does have slight limp. She declines PT referral as she would prefer to start with exercise classes. Close follow up.           I am  having [name] "Sandy" maintain her Calcium Carb-Cholecalciferol (CALCIUM 600 + D PO), Fluticasone Furoate (FLONASE SENSIMIST NA), Biotin, lidocaine, acetaminophen, hydrochlorothiazide, diclofenac Sodium, baclofen, pantoprazole, atorvastatin, Prolia, triamcinolone, traZODone, buPROPion, valACYclovir, gabapentin, PreviDent 5000 Plus, and COLLAGEN PO.   No orders of the defined types were placed in this encounter.   Return precautions given.   Risks, benefits, and alternatives of the medications and treatment  plan prescribed today were discussed, and patient expressed understanding.   Education regarding symptom management and diagnosis given to patient on AVS.  Continue to follow with Burnard Hawthorne, FNP for routine health maintenance.   [name] and I agreed with plan.   Mable Paris, FNP

## 2020-12-10 ENCOUNTER — Encounter: Payer: Self-pay | Admitting: Internal Medicine

## 2020-12-18 ENCOUNTER — Ambulatory Visit (AMBULATORY_SURGERY_CENTER): Payer: Medicare PPO | Admitting: Internal Medicine

## 2020-12-18 ENCOUNTER — Encounter: Payer: Self-pay | Admitting: Internal Medicine

## 2020-12-18 ENCOUNTER — Other Ambulatory Visit: Payer: Self-pay

## 2020-12-18 VITALS — BP 138/75 | HR 73 | Temp 97.3°F | Resp 13 | Ht 59.0 in | Wt 160.6 lb

## 2020-12-18 DIAGNOSIS — D123 Benign neoplasm of transverse colon: Secondary | ICD-10-CM

## 2020-12-18 DIAGNOSIS — D649 Anemia, unspecified: Secondary | ICD-10-CM | POA: Diagnosis not present

## 2020-12-18 DIAGNOSIS — K2970 Gastritis, unspecified, without bleeding: Secondary | ICD-10-CM | POA: Diagnosis not present

## 2020-12-18 DIAGNOSIS — R195 Other fecal abnormalities: Secondary | ICD-10-CM

## 2020-12-18 DIAGNOSIS — K635 Polyp of colon: Secondary | ICD-10-CM

## 2020-12-18 DIAGNOSIS — K2950 Unspecified chronic gastritis without bleeding: Secondary | ICD-10-CM | POA: Diagnosis not present

## 2020-12-18 DIAGNOSIS — K219 Gastro-esophageal reflux disease without esophagitis: Secondary | ICD-10-CM

## 2020-12-18 MED ORDER — SODIUM CHLORIDE 0.9 % IV SOLN: 500.0000 mL | Freq: Once | INTRAVENOUS | Status: DC

## 2020-12-18 NOTE — Progress Notes (Signed)
To PACU, VSS. Report to [email or web address] 

## 2020-12-18 NOTE — Patient Instructions (Signed)
Handout given:  Hemorrhoids, Polyps Resume previous diet Continue current medications Await pathology results YOU HAD AN ENDOSCOPIC PROCEDURE TODAY AT [facility]:   Refer to the procedure report that was given to you for any specific questions about what was found during the examination.  If the procedure report does not answer your questions, please call your gastroenterologist to clarify.  If you requested that your care partner not be given the details of your procedure findings, then the procedure report has been included in a sealed envelope for you to review at your convenience later.  YOU SHOULD EXPECT: Some feelings of bloating in the abdomen. Passage of more gas than usual.  Walking can help get rid of the air that was put into your GI tract during the procedure and reduce the bloating. If you had a lower endoscopy (such as a colonoscopy or flexible sigmoidoscopy) you may notice spotting of blood in your stool or on the toilet paper. If you underwent a bowel prep for your procedure, you may not have a normal bowel movement for a few days.  Please Note:  You might notice some irritation and congestion in your nose or some drainage.  This is from the oxygen used during your procedure.  There is no need for concern and it should clear up in a day or so.  SYMPTOMS TO REPORT IMMEDIATELY:   Following lower endoscopy (colonoscopy or flexible sigmoidoscopy):  Excessive amounts of blood in the stool  Significant tenderness or worsening of abdominal pains  Swelling of the abdomen that is new, acute  Fever of 100F or higher   Following upper endoscopy (EGD)  Vomiting of blood or coffee ground material  New chest pain or pain under the shoulder blades  Painful or persistently difficult swallowing  New shortness of breath  Fever of 100F or higher  Black, tarry-looking stools  For urgent or emergent issues, a gastroenterologist can be reached at any hour by calling [phone]  [phone]. Do not use MyChart messaging for urgent concerns.   DIET:  We do recommend a small meal at first, but then you may proceed to your regular diet.  Drink plenty of fluids but you should avoid alcoholic beverages for 24 hours.  ACTIVITY:  You should plan to take it easy for the rest of today and you should NOT DRIVE or use heavy machinery until tomorrow (because of the sedation medicines used during the test).    FOLLOW UP: Our staff will call the number listed on your records 48-72 hours following your procedure to check on you and address any questions or concerns that you may have regarding the information given to you following your procedure. If we do not reach you, we will leave a message.  We will attempt to reach you two times.  During this call, we will ask if you have developed any symptoms of COVID 19. If you develop any symptoms (ie: fever, flu-like symptoms, shortness of breath, cough etc.) before then, please call [phone].  If you test positive for Covid 19 in the 2 weeks post procedure, please call and report this information to [place].    If any biopsies were taken you will be contacted by phone or by letter within the next 1-3 weeks.  Please call us at [phone] if you have not heard about the biopsies in 3 weeks.   SIGNATURES/CONFIDENTIALITY: You and/or your care partner have signed paperwork which will be entered into your electronic medical record.  These signatures attest to the fact that that the information above on your After Visit Summary has been reviewed and is understood.  Full responsibility of the confidentiality of this discharge information lies with you and/or your care-partner.

## 2020-12-18 NOTE — Op Note (Signed)
[place] Patient Name: Claudia Andersen Procedure Date: 12/18/2020 1:15 PM MRN: [id number] Endoscopist: Jerene Bears , MD Age: 80 Referring MD:  Date of Birth: August 29, 1941 Gender: Female Account #: [id number] Procedure:                Colonoscopy Indications:              Positive Cologuard test Medicines:                Monitored Anesthesia Care Procedure:                Pre-Anesthesia Assessment:                           - Prior to the procedure, a History and Physical                            was performed, and patient medications and                            allergies were reviewed. The patient's tolerance of                            previous anesthesia was also reviewed. The risks                            and benefits of the procedure and the sedation                            options and risks were discussed with the patient.                            All questions were answered, and informed consent                            was obtained. Prior Anticoagulants: The patient has                            taken no previous anticoagulant or antiplatelet                            agents. ASA Grade Assessment: II - A patient with                            mild systemic disease. After reviewing the risks                            and benefits, the patient was deemed in                            satisfactory condition to undergo the procedure.                           After obtaining informed consent, the colonoscope  was passed under direct vision. Throughout the                            procedure, the patient's blood pressure, pulse, and                            oxygen saturations were monitored continuously. The                            Olympus PCF-[id number] (#[id number]) Colonoscope was                            introduced through the anus and advanced to the                            cecum, identified by appendiceal orifice and                             ileocecal valve. The colonoscopy was performed                            without difficulty. The patient tolerated the                            procedure well. The quality of the bowel                            preparation was good. The ileocecal valve,                            appendiceal orifice, and rectum were photographed. Scope In: 1:47:41 PM Scope Out: 2:03:39 PM Scope Withdrawal Time: 0 hours 9 minutes 45 seconds  Total Procedure Duration: 0 hours 15 minutes 58 seconds  Findings:                 The digital rectal exam was normal.                           A 7 mm polyp was found in the hepatic flexure. The                            polyp was sessile. The polyp was removed with a                            cold snare. Resection and retrieval were complete.                           Internal hemorrhoids were found during                            retroflexion. The hemorrhoids were small.                           The exam was otherwise without abnormality. Complications:  No immediate complications. Estimated Blood Loss:     Estimated blood loss was minimal. Impression:               - One 7 mm polyp at the hepatic flexure, removed                            with a cold snare. Resected and retrieved.                           - Small internal hemorrhoids.                           - The examination was otherwise normal. Recommendation:           - Patient has a contact number available for                            emergencies. The signs and symptoms of potential                            delayed complications were discussed with the                            patient. Return to normal activities tomorrow.                            Written discharge instructions were provided to the                            patient.                           - Resume previous diet.                           - Continue present medications.                            - Await pathology results.                           - No recommendation at this time regarding repeat                            colonoscopy due to age at next                            screening/surveillance interval. Jerene Bears, MD 12/18/2020 2:12:49 PM This report has been signed electronically.

## 2020-12-18 NOTE — Progress Notes (Signed)
VS by CW  Pt's states no medical or surgical changes since previsit or office visit.  

## 2020-12-18 NOTE — Op Note (Signed)
[place] Patient Name: Claudia Andersen Procedure Date: 12/18/2020 1:16 PM MRN: [id number] Endoscopist: Jerene Bears , MD Age: 80 Referring MD:  Date of Birth: 1941-01-07 Gender: Female Account #: [id number] Procedure:                Upper GI endoscopy Indications:              Gastro-esophageal reflux disease (history of) and                            normocytic anemia Medicines:                Monitored Anesthesia Care Procedure:                Pre-Anesthesia Assessment:                           - Prior to the procedure, a History and Physical                            was performed, and patient medications and                            allergies were reviewed. The patient's tolerance of                            previous anesthesia was also reviewed. The risks                            and benefits of the procedure and the sedation                            options and risks were discussed with the patient.                            All questions were answered, and informed consent                            was obtained. Prior Anticoagulants: The patient has                            taken no previous anticoagulant or antiplatelet                            agents. ASA Grade Assessment: II - A patient with                            mild systemic disease. After reviewing the risks                            and benefits, the patient was deemed in                            satisfactory condition to undergo the procedure.  After obtaining informed consent, the endoscope was                            passed under direct vision. Throughout the                            procedure, the patient's blood pressure, pulse, and                            oxygen saturations were monitored continuously. The                            Endoscope was introduced through the mouth, and                            advanced to the second part of duodenum.  The upper                            GI endoscopy was accomplished without difficulty.                            The patient tolerated the procedure well. Scope In: Scope Out: Findings:                 The examined esophagus was normal.                           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 Biopsies were                            taken with a cold forceps for histology and                            Helicobacter pylori testing.                           The examined duodenum was normal. Complications:            No immediate complications. Estimated Blood Loss:     Estimated blood loss was minimal. Impression:               - Normal esophagus.                           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 Biopsied.                           - Normal examined duodenum. Recommendation:           - Patient has a contact number available for  emergencies. The signs and symptoms of potential                            delayed complications were discussed with the                            patient. Return to normal activities tomorrow.                            Written discharge instructions were provided to the                            patient.                           - Resume previous diet.                           - Continue present medications.                           - Await pathology results. Jerene Bears, MD 12/18/2020 2:10:47 PM This report has been signed electronically.

## 2020-12-21 ENCOUNTER — Encounter: Payer: Self-pay | Admitting: Family

## 2020-12-21 ENCOUNTER — Other Ambulatory Visit: Payer: Self-pay

## 2020-12-21 MED ORDER — HYDROCHLOROTHIAZIDE 12.5 MG PO CAPS: 12.5000 mg | ORAL_CAPSULE | Freq: Every day | ORAL | 1 refills | Status: DC | PRN

## 2020-12-22 ENCOUNTER — Telehealth: Payer: Self-pay

## 2020-12-22 NOTE — Telephone Encounter (Signed)
  Follow up Call-  Call back number 12/18/2020  Post procedure Call Back phone  # [phone]  Permission to leave phone message Yes  Some recent data might be hidden     Patient questions:  Do you have a fever, pain , or abdominal swelling? No. Pain Score  0 *  Have you tolerated food without any problems? Yes.    Have you been able to return to your normal activities? Yes.    Do you have any questions about your discharge instructions: Diet   No. Medications  No. Follow up visit  No.  Do you have questions or concerns about your Care? No.  Actions: * If pain score is 4 or above: No action needed, pain <4. 1. Have you developed a fever since your procedure? no  2.   Have you had an respiratory symptoms (SOB or cough) since your procedure? no  3.   Have you tested positive for COVID 19 since your procedure no  4.   Have you had any family members/close contacts diagnosed with the COVID 19 since your procedure?  no   If yes to any of these questions please route to Joylene John, RN and Joella Prince, RN

## 2020-12-24 ENCOUNTER — Encounter: Payer: Self-pay | Admitting: Family

## 2020-12-25 ENCOUNTER — Encounter: Payer: Self-pay | Admitting: Internal Medicine

## 2020-12-29 ENCOUNTER — Encounter: Payer: Self-pay | Admitting: Family

## 2020-12-30 ENCOUNTER — Other Ambulatory Visit: Payer: Self-pay

## 2020-12-30 ENCOUNTER — Ambulatory Visit: Payer: Medicare PPO | Admitting: Family

## 2020-12-30 ENCOUNTER — Other Ambulatory Visit: Payer: Self-pay | Admitting: Family

## 2020-12-30 DIAGNOSIS — F325 Major depressive disorder, single episode, in full remission: Secondary | ICD-10-CM

## 2020-12-30 MED ORDER — VALACYCLOVIR HCL 1 G PO TABS: ORAL_TABLET | ORAL | 1 refills | Status: DC

## 2020-12-30 MED ORDER — TRAZODONE HCL 100 MG PO TABS: 100.0000 mg | ORAL_TABLET | Freq: Every day | ORAL | 2 refills | Status: DC

## 2021-01-01 ENCOUNTER — Other Ambulatory Visit: Payer: Self-pay | Admitting: Family

## 2021-01-06 ENCOUNTER — Other Ambulatory Visit: Payer: Self-pay

## 2021-01-06 ENCOUNTER — Ambulatory Visit (INDEPENDENT_AMBULATORY_CARE_PROVIDER_SITE_OTHER): Payer: Medicare PPO | Admitting: Family

## 2021-01-06 ENCOUNTER — Encounter: Payer: Self-pay | Admitting: Family

## 2021-01-06 VITALS — BP 104/64 | HR 69 | Temp 97.6°F | Ht 59.5 in | Wt 160.0 lb

## 2021-01-06 DIAGNOSIS — F325 Major depressive disorder, single episode, in full remission: Secondary | ICD-10-CM

## 2021-01-06 DIAGNOSIS — M5137 Other intervertebral disc degeneration, lumbosacral region: Secondary | ICD-10-CM | POA: Diagnosis not present

## 2021-01-06 DIAGNOSIS — M810 Age-related osteoporosis without current pathological fracture: Secondary | ICD-10-CM

## 2021-01-06 MED ORDER — GABAPENTIN 100 MG PO CAPS: ORAL_CAPSULE | ORAL | 3 refills | Status: DC

## 2021-01-06 NOTE — Assessment & Plan Note (Signed)
Controlled. Continue wellbutrin 300mg , trazodone 100mg 

## 2021-01-06 NOTE — Progress Notes (Signed)
Subjective:    Patient ID: Claudia Andersen, female    DOB: [birth year], 80 y.o.   MRN: [id number]  CC: Claudia Andersen is a 80 y.o. female who presents today for an acute visit.    HPI:  Most bothered by low back pain, x 3 weeks.She also complains of left knee, left upper arm pain for the last 3 weeks. Feels stiff in the morning.  Pain improves with activity.   No left shoulder pain.  She has tried salon pas pain patch with minimal relief.   Continues to have trouble with balance. Using walker if she needs too. Wearing good supportive shoes. Rugs at home.    No numbness in legs, trouble urinating, dysuria, fever, saddle anesthesia, vision changes.  No falls.    She stopped lipitor for 2 weeks without relief of arthralgias. .   Compliant with gabapentin 300mg  qhs with relief.   Depression - compliant with wellbutrin 300mg , trazodone 100mg . Sleeping well.  Feels regimen is adequate.  She has seen Dr Sharyne Richters and Dr Lynford Humphrey in the past.  h/o osteoporosis- on prolia, following with Malissa Hippo. Due for bone density. Will defer to endocrine for management.   Left total knee arthroplasty, right total hip arthroplasty  MR sacrum, lumbar 10/2018- known severe L1 compression fracture.  Xr ray 04/2020 thoracic and lumbar - with [facility], cannot see images.  HISTORY:  Past Medical History:  Diagnosis Date  . Ankle fracture, bimalleolar, closed, right, initial encounter 10/26/2018   d/t fall 10/26/18  . Arthritis    generalized  . Back abrasion    CRUSHED VERTEBRAE  . Cataract    bilateral sx   . Closed right radial fracture 10/26/2018   s/p a fall  . Compression of lumbar vertebra ([place])    L1 and L4 (from fall November 05, 2018)  . Depression    hx of-on meds  . Gallstones   . GERD (gastroesophageal reflux disease)    on meds  . Migraines   . Osteoporosis    on Prolia inj  . Pulmonary emboli (HCC)    history of emboli   Past Surgical History:  Procedure Laterality Date  .  ABDOMINAL HYSTERECTOMY    . APPENDECTOMY    . CATARACT EXTRACTION W/PHACO Right 03/27/2018   Procedure: CATARACT EXTRACTION PHACO AND INTRAOCULAR LENS PLACEMENT (IOC);  Surgeon: Birder Robson, MD;  Location: [facility] ORS;  Service: Ophthalmology;  Laterality: Right;  [place] 00:33 AP% 15.3 CDE 5.18 Fluid pack lot # [id number]  . CATARACT EXTRACTION W/PHACO Left 06/26/2018   Procedure: CATARACT EXTRACTION PHACO AND INTRAOCULAR LENS PLACEMENT ([place]);  Surgeon: Birder Robson, MD;  Location: [facility] ORS;  Service: Ophthalmology;  Laterality: Left;  [place] 00:38 AP% 15.2 CDE 5.80 Fluid pack lot # [id number]  . FRACTURE SURGERY Right    RIGHT RADIAL wrist  . TONSILLECTOMY AND ADENOIDECTOMY    . 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 TOTAL KNEE ARTHROPLASTY Left   . TRIPLE SUBTALAR FUSION    . WISDOM TOOTH EXTRACTION     Family History  Problem Relation Age of Onset  . Arthritis Mother   . Stroke Mother   . Hemachromatosis Mother   . Heart disease Father   . Pneumonia Maternal Grandmother   . Heart disease Maternal Grandfather   . Heart disease Paternal Grandmother   . Heart disease  Paternal Grandfather   . Breast cancer Neg Hx   . Colon cancer Neg Hx   . Colon polyps Neg Hx   . Esophageal cancer Neg Hx   . Rectal cancer Neg Hx   . Stomach cancer Neg Hx     Allergies: Patient has no known allergies. Current Outpatient Medications on File Prior to Visit  Medication Sig Dispense Refill  . acetaminophen (TYLENOL) 500 MG tablet Take 2 tablets (1,000 mg total) by mouth every 8 (eight) hours as needed for mild pain, moderate pain, fever or headache. 50 tablet 0  . atorvastatin (LIPITOR) 10 MG tablet TAKE 1 TABLET BY MOUTH DAILY 90 tablet 0  . baclofen (LIORESAL) 10 MG tablet Take 0.5 tablets (5 mg total) by mouth 3 (three) times daily as needed for  muscle spasms. 30 each 1  . Biotin 2500 MCG CAPS Take 2,500 mcg by mouth daily.    Marland Kitchen buPROPion (WELLBUTRIN XL) 300 MG 24 hr tablet Take 1 tablet (300 mg total) by mouth daily. 90 tablet 1  . Calcium Carb-Cholecalciferol (CALCIUM 600 + D PO) Take 2 tablets by mouth daily.     . COLLAGEN PO Take 3 tablets by mouth in the morning and at bedtime.    Marland Kitchen denosumab (PROLIA) 60 MG/ML SOSY injection Inject 60 mg into the skin every 6 (six) months.    . diclofenac Sodium (VOLTAREN) 1 % GEL Apply 4 g topically 4 (four) times daily. (Patient taking differently: Apply 4 g topically 4 (four) times daily as needed.) 100 g 1  . Fluticasone Furoate (FLONASE SENSIMIST NA) Place 1 spray into both nostrils daily as needed (allergies).     . gabapentin (NEURONTIN) 300 MG capsule at bedtime.    . hydrochlorothiazide (MICROZIDE) 12.5 MG capsule Take 1 capsule (12.5 mg total) by mouth daily as needed. 30 capsule 1  . lidocaine (LIDODERM) 5 % Place 1 patch onto the skin every 12 (twelve) hours. Remove & Discard patch within 12 hours or as directed by MD 10 patch 0  . pantoprazole (PROTONIX) 40 MG tablet TAKE ONE TABLET BY MOUTH TWICE DAILY 180 tablet 2  . PREVIDENT 5000 PLUS 1.1 % CREA dental cream Take by mouth in the morning and at bedtime.    . traZODone (DESYREL) 100 MG tablet Take 1 tablet (100 mg total) by mouth at bedtime. 90 tablet 2  . triamcinolone (KENALOG) 0.025 % cream Apply 1 application topically 2 (two) times daily. 80 g 1  . valACYclovir (VALTREX) 1000 MG tablet TAKE TWO (2) TABLETS BY MOUTH EVERY 12 HOURS FOR 1 DAY **START AS SOON AS POSSIBLE AFTER SYMPTOM ONSET** 6 tablet 1   No current facility-administered medications on file prior to visit.    Social History   Tobacco Use  . Smoking status: Former Smoker    Packs/day: 0.50    Years: 22.00    Pack years: 11.00    Types: Cigarettes    Quit date: 1982    Years since quitting: 40.2  . Smokeless tobacco: Never Used  Vaping Use  . Vaping Use:  Never used  Substance Use Topics  . Alcohol use: Yes    Alcohol/week: 10.0 standard drinks    Types: 10 Standard drinks or equivalent per week  . Drug use: Never    Review of Systems  Constitutional: Negative for chills and fever.  Eyes: Negative for visual disturbance.  Respiratory: Negative for cough.   Cardiovascular: Negative for chest pain and palpitations.  Gastrointestinal: Negative for  nausea and vomiting.  Genitourinary: Negative for dysuria.  Musculoskeletal: Positive for arthralgias and back pain. Negative for joint swelling.  Neurological: Negative for dizziness and headaches.      Objective:    BP 104/64   Pulse 69   Temp 97.6 F (36.4 C)   Ht 4' 11.5" (1.511 m)   Wt 160 lb (72.6 kg)   SpO2 99%   BMI 31.78 kg/m    Physical Exam Vitals reviewed.  Constitutional:      Appearance: She is well-developed.  Eyes:     Conjunctiva/sclera: Conjunctivae normal.  Cardiovascular:     Rate and Rhythm: Normal rate and regular rhythm.     Pulses: Normal pulses.     Heart sounds: Normal heart sounds.  Pulmonary:     Effort: Pulmonary effort is normal.     Breath sounds: Normal breath sounds. No wheezing, rhonchi or rales.  Musculoskeletal:     Left upper arm: No swelling, edema, tenderness or bony tenderness.     Thoracic back: No swelling, tenderness or bony tenderness. Normal range of motion.     Lumbar back: No swelling, edema, spasms, tenderness or bony tenderness. Normal range of motion. Negative right straight leg raise test and negative left straight leg raise test.     Right lower leg: No bony tenderness. No edema.     Left lower leg: No bony tenderness. No edema.       Legs:     Comments: Full range of motion with flexion, tension, lateral side bends. No bony tenderness. No pain, numbness, tingling elicited with single leg raise bilaterally.  Scar noted left knee.  Bilateral knees are symmetric. No effusion appreciated. No increase in warmth or erythema.  Crepitus felt with flexion of bilateral knees.  Left knee:  Able to extend to -5 to 10 degrees and flex to 110 degrees. No catching with McMurray maneuver. No patellar apprehension.  No calf tenderness of lower leg edema bilaterally.    Skin:    General: Skin is warm and dry.  Neurological:     Mental Status: She is alert.     Sensory: No sensory deficit.     Deep Tendon Reflexes:     Reflex Scores:      Patellar reflexes are 2+ on the right side and 2+ on the left side.    Comments: Sensation and strength intact bilateral lower extremities.  Psychiatric:        Speech: Speech normal.        Behavior: Behavior normal.        Thought Content: Thought content normal.        Assessment & Plan:    Problem List Items Addressed This Visit      Musculoskeletal and Integument   Osteoporosis    on prolia, following with Malissa Hippo. Due for bone density. Will defer to endocrine for management. She has follow up in June.         Other   Arthralgia - Primary    Acute on chronic low back pain, left knee pain. Left upper arm exam reassuring. She declines left arm xray. She preferred trial increase of gabapentin to include 100mg  qam, 100mg  midday and continued 300mg  qhs. PT referral to evaluate balance and for fall prevention. Close follow up.        Relevant Medications   gabapentin (NEURONTIN) 100 MG capsule   Depression, major, single episode, complete remission ([place])    Controlled. Continue wellbutrin 300mg , trazodone 100mg   I am having [name] "Sandy" start on gabapentin. I am also having her maintain her Calcium Carb-Cholecalciferol (CALCIUM 600 + D PO), Fluticasone Furoate (FLONASE SENSIMIST NA), Biotin, lidocaine, acetaminophen, diclofenac Sodium, baclofen, pantoprazole, Prolia, triamcinolone, buPROPion, gabapentin, PreviDent 5000 Plus, COLLAGEN PO, hydrochlorothiazide, valACYclovir, traZODone, and atorvastatin.   Meds ordered this encounter   Medications  . gabapentin (NEURONTIN) 100 MG capsule    Sig: Take 100mg  po in the morning and one 100mg  po at noon.    Dispense:  90 capsule    Refill:  3    Order Specific Question:   Supervising Provider    Answer:   Crecencio Mc [[id number]]    Return precautions given.   Risks, benefits, and alternatives of the medications and treatment plan prescribed today were discussed, and patient expressed understanding.   Education regarding symptom management and diagnosis given to patient on AVS.  Continue to follow with Burnard Hawthorne, FNP for routine health maintenance.   [name] and I agreed with plan.   Mable Paris, FNP

## 2021-01-06 NOTE — Assessment & Plan Note (Signed)
on prolia, following with Malissa Hippo. Due for bone density. Will defer to endocrine for management. She has follow up in June.

## 2021-01-06 NOTE — Patient Instructions (Signed)
Increase gabapentin to 100mg  tablet in morning and at noon; continue 300mg  tablet at night  Nice to see you!

## 2021-01-06 NOTE — Assessment & Plan Note (Addendum)
Acute on chronic low back pain, left knee pain. Left upper arm exam reassuring. She declines left arm xray. She preferred trial increase of gabapentin to include 100mg  qam, 100mg  midday and continued 300mg  qhs. PT referral to evaluate balance and for fall prevention. Close follow up.

## 2021-01-15 ENCOUNTER — Encounter: Payer: Self-pay | Admitting: Family

## 2021-01-21 ENCOUNTER — Encounter: Payer: Self-pay | Admitting: Family

## 2021-01-22 ENCOUNTER — Encounter: Payer: Self-pay | Admitting: Family

## 2021-01-25 ENCOUNTER — Other Ambulatory Visit: Payer: Self-pay

## 2021-01-25 ENCOUNTER — Ambulatory Visit: Payer: Medicare PPO | Admitting: Family

## 2021-01-25 ENCOUNTER — Encounter: Payer: Self-pay | Admitting: Family

## 2021-01-25 VITALS — BP 142/76 | HR 73 | Temp 97.5°F | Ht 59.5 in | Wt 161.4 lb

## 2021-01-25 DIAGNOSIS — G8929 Other chronic pain: Secondary | ICD-10-CM

## 2021-01-25 DIAGNOSIS — R197 Diarrhea, unspecified: Secondary | ICD-10-CM | POA: Diagnosis not present

## 2021-01-25 DIAGNOSIS — M5137 Other intervertebral disc degeneration, lumbosacral region: Secondary | ICD-10-CM

## 2021-01-25 DIAGNOSIS — M545 Low back pain, unspecified: Secondary | ICD-10-CM | POA: Diagnosis not present

## 2021-01-25 MED ORDER — GABAPENTIN 100 MG PO CAPS: 200.0000 mg | ORAL_CAPSULE | Freq: Two times a day (BID) | ORAL | 1 refills | Status: DC

## 2021-01-25 MED ORDER — GABAPENTIN 100 MG PO CAPS
ORAL_CAPSULE | ORAL | 3 refills | Status: DC
Start: 2021-01-25 — End: 2021-01-25

## 2021-01-25 NOTE — Progress Notes (Signed)
Subjective:    Patient ID: Claudia Andersen, female    DOB: [birth year], 80 y.o.   MRN: [id number]  CC: Claudia Andersen is a 80 y.o. female who presents today for an acute visit HPI: Complains of soreness and possible lump right upper back, 4 days.  She was doing PT and therapist noticed palpable right side of back, under shoulder pain. She started PT last week and feels so far it has been helpful for pain low back pain.  She feels right upper lateral back is 'sore.' No exquisite pain.   No fever, chills.   She continues to have right thoracic lateral side pain near the bra line. No rash, dysuria, fever, N, vomiting.     Low back pain - has improved with two naprosyn , 5mg  baclofen, gabapentin 100mg  qam, 100mg  midday and 300mg  qhs.She continues to use lidocaine patch, voltaren gel with temporary relief.   She has seen both Dr Sharyne Richters and Dr Lynford Humphrey  She is also having nonbloody watery green diarrhea 2-3 days, unchanged. Approx 2 episodes per day. No travel or unusual food. She is not using peptobismal. She is using imodium starting today. No recent antibiotic. No fever, abdominal pain.    HISTORY:  Past Medical History:  Diagnosis Date  . Ankle fracture, bimalleolar, closed, right, initial encounter 10/26/2018   d/t fall 10/26/18  . Arthritis    generalized  . Back abrasion    CRUSHED VERTEBRAE  . Cataract    bilateral sx   . Closed right radial fracture 10/26/2018   s/p a fall  . Compression of lumbar vertebra ([place])    L1 and L4 (from fall November 05, 2018)  . Depression    hx of-on meds  . Gallstones   . GERD (gastroesophageal reflux disease)    on meds  . Migraines   . Osteoporosis    on Prolia inj  . Pulmonary emboli (HCC)    history of emboli   Past Surgical History:  Procedure Laterality Date  . ABDOMINAL HYSTERECTOMY    . APPENDECTOMY    . CATARACT EXTRACTION W/PHACO Right 03/27/2018   Procedure: CATARACT EXTRACTION PHACO AND INTRAOCULAR LENS PLACEMENT (IOC);   Surgeon: Birder Robson, MD;  Location: [facility];  Service: Ophthalmology;  Laterality: Right;  [place] 00:33 AP% 15.3 CDE 5.18 Fluid pack lot # [id number]  . CATARACT EXTRACTION W/PHACO Left 06/26/2018   Procedure: CATARACT EXTRACTION PHACO AND INTRAOCULAR LENS PLACEMENT ([place]);  Surgeon: Birder Robson, MD;  Location: [facility] OR[facility];  Service: Ophthalmology;  Laterality: Left;  [place] 00:38 AP% 15.2 CDE 5.80 Fluid pack lot # [id number]  . FRACTURE SURGERY Right    RIGHT RADIAL wrist  . TONSILLECTOMY AND ADENOIDECTOMY    . 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 TOTAL KNEE ARTHROPLASTY Left   . TRIPLE SUBTALAR FUSION    . WISDOM TOOTH EXTRACTION     Family History  Problem Relation Age of Onset  . Arthritis Mother   . Stroke Mother   . Hemachromatosis Mother   . Heart disease Father   . Pneumonia Maternal Grandmother   . Heart disease Maternal Grandfather   . Heart disease Paternal Grandmother   . Heart disease Paternal Grandfather   . Breast cancer Neg Hx   . Colon cancer Neg Hx   . Colon polyps Neg Hx   . Esophageal  cancer Neg Hx   . Rectal cancer Neg Hx   . Stomach cancer Neg Hx     Allergies: Patient has no known allergies. Current Outpatient Medications on File Prior to Visit  Medication Sig Dispense Refill  . acetaminophen (TYLENOL) 500 MG tablet Take 2 tablets (1,000 mg total) by mouth every 8 (eight) hours as needed for mild pain, moderate pain, fever or headache. 50 tablet 0  . atorvastatin (LIPITOR) 10 MG tablet TAKE 1 TABLET BY MOUTH DAILY 90 tablet 0  . baclofen (LIORESAL) 10 MG tablet Take 0.5 tablets (5 mg total) by mouth 3 (three) times daily as needed for muscle spasms. 30 each 1  . Biotin 2500 MCG CAPS Take 2,500 mcg by mouth daily.    Marland Kitchen buPROPion (WELLBUTRIN XL) 300 MG 24 hr tablet Take 1 tablet (300 mg total) by mouth  daily. 90 tablet 1  . Calcium Carb-Cholecalciferol (CALCIUM 600 + D PO) Take 2 tablets by mouth daily.     . COLLAGEN PO Take 3 tablets by mouth in the morning and at bedtime.    Marland Kitchen denosumab (PROLIA) 60 MG/ML SOSY injection Inject 60 mg into the skin every 6 (six) months.    . diclofenac Sodium (VOLTAREN) 1 % GEL Apply 4 g topically 4 (four) times daily. (Patient taking differently: Apply 4 g topically 4 (four) times daily as needed.) 100 g 1  . Fluticasone Furoate (FLONASE SENSIMIST NA) Place 1 spray into both nostrils daily as needed (allergies).     . gabapentin (NEURONTIN) 300 MG capsule at bedtime.    . hydrochlorothiazide (MICROZIDE) 12.5 MG capsule Take 1 capsule (12.5 mg total) by mouth daily as needed. 30 capsule 1  . lidocaine (LIDODERM) 5 % Place 1 patch onto the skin every 12 (twelve) hours. Remove & Discard patch within 12 hours or as directed by MD 10 patch 0  . pantoprazole (PROTONIX) 40 MG tablet TAKE ONE TABLET BY MOUTH TWICE DAILY 180 tablet 2  . PREVIDENT 5000 PLUS 1.1 % CREA dental cream Take by mouth in the morning and at bedtime.    . traZODone (DESYREL) 100 MG tablet Take 1 tablet (100 mg total) by mouth at bedtime. 90 tablet 2  . triamcinolone (KENALOG) 0.025 % cream Apply 1 application topically 2 (two) times daily. 80 g 1  . valACYclovir (VALTREX) 1000 MG tablet TAKE TWO (2) TABLETS BY MOUTH EVERY 12 HOURS FOR 1 DAY **START AS SOON AS POSSIBLE AFTER SYMPTOM ONSET** 6 tablet 1   No current facility-administered medications on file prior to visit.    Social History   Tobacco Use  . Smoking status: Former Smoker    Packs/day: 0.50    Years: 22.00    Pack years: 11.00    Types: Cigarettes    Quit date: 1982    Years since quitting: 40.3  . Smokeless tobacco: Never Used  Vaping Use  . Vaping Use: Never used  Substance Use Topics  . Alcohol use: Yes    Alcohol/week: 10.0 standard drinks    Types: 10 Standard drinks or equivalent per week  . Drug use: Never     Review of Systems  Constitutional: Negative for chills and fever.  Respiratory: Negative for cough.   Cardiovascular: Negative for chest pain and palpitations.  Gastrointestinal: Positive for diarrhea. Negative for abdominal distention, abdominal pain, blood in stool, constipation, nausea and vomiting.  Genitourinary: Negative for dysuria.  Musculoskeletal: Positive for back pain.  Neurological: Negative for numbness.  Objective:    BP (!) 142/76   Pulse 73   Temp (!) 97.5 F (36.4 C) (Oral) Comment: Pt had been drinking ice water  Ht 4' 11.5" (1.511 m)   Wt 161 lb 6.4 oz (73.2 kg)   SpO2 96%   BMI 32.05 kg/m  BP Readings from Last 3 Encounters:  01/25/21 (!) 142/76  01/06/21 104/64  12/18/20 138/75   Wt Readings from Last 3 Encounters:  01/25/21 161 lb 6.4 oz (73.2 kg)  01/06/21 160 lb (72.6 kg)  12/18/20 160 lb 9.6 oz (72.8 kg)    Physical Exam Vitals reviewed.  Constitutional:      Appearance: She is well-developed.  Eyes:     Conjunctiva/sclera: Conjunctivae normal.  Cardiovascular:     Rate and Rhythm: Normal rate and regular rhythm.     Pulses: Normal pulses.     Heart sounds: Normal heart sounds.  Pulmonary:     Effort: Pulmonary effort is normal.     Breath sounds: Normal breath sounds. No wheezing, rhonchi or rales.  Musculoskeletal:     Comments: No palpable or well circumscribed mass thoracic back or right flank.   Skin:    General: Skin is warm and dry.  Neurological:     Mental Status: She is alert.  Psychiatric:        Speech: Speech normal.        Behavior: Behavior normal.        Thought Content: Thought content normal.        Assessment & Plan:   Problem List Items Addressed This Visit      Other   Diarrhea    Acute onset 3 days. 2 episodes per day. Afebrile. Advised to stop imodium and if continues to return GI pathogen panel. She will let me know of any new concerns.       Relevant Orders   GI pathogen panel by PCR,  stool   GI pathogen panel by PCR, stool   Low back pain    Improved overall. Due to thoracic pain today and no discerning mass on exam, we agreed to increase gabapentin to 200mg  qam and 200mg  middday and continue gabapentin 300mg  qhs. Known compression fracture, DDD, referral to Dr Leanord Hawking for further evaluation and treatment. May consider cymbalta 30mg  qd in the future.        Other Visit Diagnoses    DDD (degenerative disc disease), lumbosacral    -  Primary   Relevant Medications   gabapentin (NEURONTIN) 100 MG capsule   Other Relevant Orders   Ambulatory referral to Orthopedic Surgery       I have changed [name] "Sandy"'s gabapentin. I am also having her maintain her Calcium Carb-Cholecalciferol (CALCIUM 600 + D PO), Fluticasone Furoate (FLONASE SENSIMIST NA), Biotin, lidocaine, acetaminophen, diclofenac Sodium, baclofen, pantoprazole, Prolia, triamcinolone, buPROPion, gabapentin, PreviDent 5000 Plus, COLLAGEN PO, hydrochlorothiazide, valACYclovir, traZODone, and atorvastatin.   Meds ordered this encounter  Medications  . gabapentin (NEURONTIN) 100 MG capsule    Sig: Take 200mg  po in the morning and one 200mg  po at noon.    Dispense:  90 capsule    Refill:  3    Order Specific Question:   Supervising Provider    Answer:   Crecencio Mc [[id number]]    Return precautions given.   Risks, benefits, and alternatives of the medications and treatment plan prescribed today were discussed, and patient expressed understanding.   Education regarding symptom management and diagnosis given to  patient on AVS.  Continue to follow with Burnard Hawthorne, FNP for routine health maintenance.   [name] and I agreed with plan.   Mable Paris, FNP

## 2021-01-25 NOTE — Patient Instructions (Addendum)
[employer] STOP imodium If diarrhea persists, please return stool test to [facility] as this is quickest way to get results.  If you develop abdominal pain, worsening diarrhea or fever, please call me ASAP  Increase gabapentin to 200mg  in the morning and at noon. continge gabapentin 300mg  at night  Referral to Dr Leanord Hawking  Let us know if you dont hear back within a week in regards to an appointment being scheduled.

## 2021-01-25 NOTE — Assessment & Plan Note (Signed)
Acute onset 3 days. 2 episodes per day. Afebrile. Advised to stop imodium and if continues to return GI pathogen panel. She will let me know of any new concerns.

## 2021-01-25 NOTE — Assessment & Plan Note (Signed)
Improved overall. Due to thoracic pain today and no discerning mass on exam, we agreed to increase gabapentin to 200mg  qam and 200mg  middday and continue gabapentin 300mg  qhs. Known compression fracture, DDD, referral to Dr Leanord Hawking for further evaluation and treatment. May consider cymbalta 30mg  qd in the future.

## 2021-01-26 ENCOUNTER — Other Ambulatory Visit: Payer: Self-pay | Admitting: Family

## 2021-01-27 ENCOUNTER — Other Ambulatory Visit
Admission: RE | Admit: 2021-01-27 | Discharge: 2021-01-27 | Disposition: A | Discharge status: Home / Self Care | Payer: Medicare PPO | Source: Ambulatory Visit | Attending: Family | Admitting: Family

## 2021-01-27 DIAGNOSIS — M5137 Other intervertebral disc degeneration, lumbosacral region: Secondary | ICD-10-CM | POA: Insufficient documentation

## 2021-01-27 DIAGNOSIS — R197 Diarrhea, unspecified: Secondary | ICD-10-CM | POA: Diagnosis not present

## 2021-01-27 LAB — GASTROINTESTINAL PANEL BY PCR, STOOL (REPLACES STOOL CULTURE)

## 2021-02-03 ENCOUNTER — Other Ambulatory Visit: Payer: Self-pay | Admitting: Family

## 2021-02-03 NOTE — Progress Notes (Signed)
DDD is there. What else is needed?

## 2021-02-04 ENCOUNTER — Encounter: Payer: Self-pay | Admitting: Family

## 2021-02-08 ENCOUNTER — Other Ambulatory Visit: Payer: Self-pay | Admitting: Family

## 2021-02-08 DIAGNOSIS — Z1231 Encounter for screening mammogram for malignant neoplasm of breast: Secondary | ICD-10-CM

## 2021-02-17 ENCOUNTER — Ambulatory Visit: Payer: Medicare PPO | Admitting: Family

## 2021-02-17 ENCOUNTER — Encounter: Payer: Self-pay | Admitting: Family

## 2021-02-19 ENCOUNTER — Telehealth: Payer: Self-pay | Admitting: Family

## 2021-02-19 ENCOUNTER — Encounter: Payer: Medicare PPO | Admitting: Family

## 2021-02-19 ENCOUNTER — Encounter: Payer: Self-pay | Admitting: Family

## 2021-02-19 NOTE — Telephone Encounter (Signed)
I called and Dr. Crista Elliot office stated that Caryl Pina was doing procedures. From the notes receptionist thought she was requesting last MRI report. I will fax that & I have faxed last OV notes as well. I asked that she send Caryl Pina a message if anything else was needed to call me back.

## 2021-02-19 NOTE — Telephone Encounter (Signed)
Caryl Pina from [facility] called to see if we have received a MRI yet. She also would like for [place] to fax over the last office that PT had with [facility] on 01/25/2021 to [phone].

## 2021-02-19 NOTE — Telephone Encounter (Signed)
No rush let me know if this was in folder for review? I am just unsure since there were a few notes on patient's for review. I will continue to check throughout the day.

## 2021-02-19 NOTE — Progress Notes (Signed)
Subjective:    Patient ID: Claudia Andersen, female    DOB: [birth year], 80 y.o.   MRN: [id number]  CC: Claudia Andersen is a 80 y.o. female who presents today for an acute visit.    HPI: Worsening depression 12 days out of the month, feels she has sporadic depressive symptoms. She is most bothered by apathy. No significant life changes or stressors. Recent stress and worry over son in law has improved as he is well now. Sleeping well.   Compliant with wellbutrin 300mg , trazodone 100mg   diarhea completely resolved. No abdominal pain.   Low back pain and muscle spasms are unchanged.   Compliant with gabapentin to 200mg  qam and 200mg  middday and continue gabapentin 300mg  qhs which is helpful.   She saw dr Claudia Andersen today whom referred her to PT for massage and manipulation. Dr Claudia Andersen advised increased baclofen 5mg  TID. She feels this is right direction and excited to see improvement with PT. Note is not in Epic as of yet.      HISTORY:  Past Medical History:  Diagnosis Date  . Ankle fracture, bimalleolar, closed, right, initial encounter 10/26/2018   d/t fall 10/26/18  . Arthritis    generalized  . Back abrasion    CRUSHED VERTEBRAE  . Cataract    bilateral sx   . Closed right radial fracture 10/26/2018   s/p a fall  . Compression of lumbar vertebra ([place])    L1 and L4 (from fall November 05, 2018)  . Depression    hx of-on meds  . Gallstones   . GERD (gastroesophageal reflux disease)    on meds  . Migraines   . Osteoporosis    on Prolia inj  . Pulmonary emboli (HCC)    history of emboli   Past Surgical History:  Procedure Laterality Date  . ABDOMINAL HYSTERECTOMY    . APPENDECTOMY    . CATARACT EXTRACTION W/PHACO Right 03/27/2018   Procedure: CATARACT EXTRACTION PHACO AND INTRAOCULAR LENS PLACEMENT (IOC);  Surgeon: Claudia Robson, MD;  Location: [facility] ORS;  Service: Ophthalmology;  Laterality: Right;  [place] 00:33 AP% 15.3 CDE 5.18 Fluid pack lot # [id number]  . CATARACT  EXTRACTION W/PHACO Left 06/26/2018   Procedure: CATARACT EXTRACTION PHACO AND INTRAOCULAR LENS PLACEMENT ([place]);  Surgeon: Claudia Robson, MD;  Location: [facility] OR[facility];  Service: Ophthalmology;  Laterality: Left;  [place] 00:38 AP% 15.2 CDE 5.80 Fluid pack lot # [id number]  . FRACTURE SURGERY Right    RIGHT RADIAL wrist  . TONSILLECTOMY AND ADENOIDECTOMY    . 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   Procedure: TOTAL HIP ARTHROPLASTY ANTERIOR APPROACH;  Surgeon: Claudia Sheehan, MD;  Location: [facility] ORS;  Service: Orthopedics;  Laterality: Right;  . TOTAL KNEE ARTHROPLASTY Left   . TRIPLE SUBTALAR FUSION    . WISDOM TOOTH EXTRACTION     Family History  Problem Relation Age of Onset  . Arthritis Mother   . Stroke Mother   . Hemachromatosis Mother   . Heart disease Father   . Pneumonia Maternal Grandmother   . Heart disease Maternal Grandfather   . Heart disease Paternal Grandmother   . Heart disease Paternal Grandfather   . Breast cancer Neg Hx   . Colon cancer Neg Hx   . Colon polyps Neg Hx   . Esophageal cancer Neg Hx   . Rectal cancer Neg Hx   . Stomach cancer Neg Hx     Allergies: Patient has no known allergies. Current Outpatient Medications on File  Prior to Visit  Medication Sig Dispense Refill  . acetaminophen (TYLENOL) 500 MG tablet Take 2 tablets (1,000 mg total) by mouth every 8 (eight) hours as needed for mild pain, moderate pain, fever or headache. 50 tablet 0  . atorvastatin (LIPITOR) 10 MG tablet TAKE 1 TABLET BY MOUTH DAILY 90 tablet 0  . baclofen (LIORESAL) 10 MG tablet Take 0.5 tablets (5 mg total) by mouth 3 (three) times daily as needed for muscle spasms. 30 each 1  . Biotin 2500 MCG CAPS Take 2,500 mcg by mouth daily.    Marland Kitchen buPROPion (WELLBUTRIN XL) 300 MG 24 hr tablet Take 1 tablet (300 mg total) by mouth daily. 90 tablet 1  . Calcium Carb-Cholecalciferol (CALCIUM 600 + D PO) Take 2 tablets by mouth daily.     . COLLAGEN PO Take 3 tablets by mouth in the morning and at  bedtime.    Marland Kitchen denosumab (PROLIA) 60 MG/ML SOSY injection Inject 60 mg into the skin every 6 (six) months.    . diclofenac Sodium (VOLTAREN) 1 % GEL Apply 4 g topically 4 (four) times daily. (Patient taking differently: Apply 4 g topically 4 (four) times daily as needed.) 100 g 1  . Fluticasone Furoate (FLONASE SENSIMIST NA) Place 1 spray into both nostrils daily as needed (allergies).     . gabapentin (NEURONTIN) 100 MG capsule Take 2 capsules (200 mg total) by mouth 2 (two) times daily. Take 200mg  po in the morning and one 200mg  po at noon. 360 capsule 1  . gabapentin (NEURONTIN) 300 MG capsule TAKE 1 CAPSULE BY MOUTH NIGHTLY 90 capsule 1  . hydrochlorothiazide (MICROZIDE) 12.5 MG capsule TAKE 1 CAPSULE BY MOUTH EVERY DAY AS NEEDED 30 capsule 1  . lidocaine (LIDODERM) 5 % Place 1 patch onto the skin every 12 (twelve) hours. Remove & Discard patch within 12 hours or as directed by MD 10 patch 0  . pantoprazole (PROTONIX) 40 MG tablet TAKE ONE TABLET BY MOUTH TWICE DAILY 180 tablet 2  . PREVIDENT 5000 PLUS 1.1 % CREA dental cream Take by mouth in the morning and at bedtime.    . triamcinolone (KENALOG) 0.025 % cream Apply 1 application topically 2 (two) times daily. 80 g 1  . valACYclovir (VALTREX) 1000 MG tablet TAKE TWO TABLETS BY MOUTH EVERY 12 HOURSFOR 1 DAY. **START AS SOON AS POSSIBLE AFTER SYMPTOM ONSET** 6 tablet 1   No current facility-administered medications on file prior to visit.    Social History   Tobacco Use  . Smoking status: Former Smoker    Packs/day: 0.50    Years: 22.00    Pack years: 11.00    Types: Cigarettes    Quit date: 1982    Years since quitting: 40.3  . Smokeless tobacco: Never Used  Vaping Use  . Vaping Use: Never used  Substance Use Topics  . Alcohol use: Yes    Alcohol/week: 10.0 standard drinks    Types: 10 Standard drinks or equivalent per week  . Drug use: Never    Review of Systems  Constitutional: Negative for chills and fever.  Respiratory:  Negative for cough.   Cardiovascular: Negative for chest pain and palpitations.  Gastrointestinal: Negative for nausea and vomiting.  Musculoskeletal: Positive for back pain.      Objective:    BP 122/72 (BP Location: Left Arm, Patient Position: Sitting, Cuff Size: Large)   Pulse 81   Temp 97.6 F (36.4 C) (Oral)   Ht 4\' 11"  (1.499 m)  Wt 157 lb (71.2 kg)   SpO2 97%   BMI 31.71 kg/m    Physical Exam Vitals reviewed.  Constitutional:      Appearance: She is well-developed.  Eyes:     Conjunctiva/sclera: Conjunctivae normal.  Cardiovascular:     Rate and Rhythm: Normal rate and regular rhythm.     Pulses: Normal pulses.     Heart sounds: Normal heart sounds.  Pulmonary:     Effort: Pulmonary effort is normal.     Breath sounds: Normal breath sounds. No wheezing, rhonchi or rales.  Skin:    General: Skin is warm and dry.  Neurological:     Mental Status: She is alert.  Psychiatric:        Speech: Speech normal.        Behavior: Behavior normal.        Thought Content: Thought content normal.        Assessment & Plan:   Problem List Items Addressed This Visit      Other   Depression, major, single episode, complete remission ([place])    Uncontrolled. Decrease trazodone to 50mg . Start prozac 10mg  for more activating SSRI. Continue wellbutrin 300mg . QTc < 460 on last EKG 04/2020. Close follow up.       Relevant Medications   traZODone (DESYREL) 100 MG tablet   FLUoxetine (PROZAC) 10 MG tablet   Low back pain    Chronic. Following with Dr Claudia Andersen and pursuing PT. Will follow.            I have changed [name] "Sandy"'s traZODone. I am also having her start on FLUoxetine. Additionally, I am having her maintain her Calcium Carb-Cholecalciferol (CALCIUM 600 + D PO), Fluticasone Furoate (FLONASE SENSIMIST NA), Biotin, lidocaine, acetaminophen, diclofenac Sodium, baclofen, pantoprazole, Prolia, triamcinolone, buPROPion, PreviDent 5000 Plus, COLLAGEN PO,  atorvastatin, gabapentin, hydrochlorothiazide, valACYclovir, and gabapentin.   Meds ordered this encounter  Medications  . traZODone (DESYREL) 100 MG tablet    Sig: Take 0.5 tablets (50 mg total) by mouth at bedtime.    Dispense:  90 tablet    Refill:  2    FOR NEXT FILL    Order Specific Question:   Supervising Provider    Answer:   Deborra Medina L [[id number]]  . FLUoxetine (PROZAC) 10 MG tablet    Sig: Take 1 tablet (10 mg total) by mouth daily.    Dispense:  90 tablet    Refill:  3    Order Specific Question:   Supervising Provider    Answer:   Crecencio Mc [[id number]]    Return precautions given.   Risks, benefits, and alternatives of the medications and treatment plan prescribed today were discussed, and patient expressed understanding.   Education regarding symptom management and diagnosis given to patient on AVS.  Continue to follow with Burnard Hawthorne, FNP for routine health maintenance.   [name] and I agreed with plan.   Mable Paris, FNP

## 2021-02-19 NOTE — Telephone Encounter (Signed)
Call ashley back  Is she from Dr Sharlet Salina office? Please confirm as im confused She seems him next week  Nothing in my folder for ms Papadopoulos  Last mri was of thoracic spine 05/2020

## 2021-02-22 ENCOUNTER — Other Ambulatory Visit: Payer: Self-pay

## 2021-02-22 ENCOUNTER — Encounter: Payer: Self-pay | Admitting: Family

## 2021-02-22 ENCOUNTER — Ambulatory Visit: Payer: Medicare PPO | Admitting: Family

## 2021-02-22 DIAGNOSIS — F325 Major depressive disorder, single episode, in full remission: Secondary | ICD-10-CM | POA: Diagnosis not present

## 2021-02-22 DIAGNOSIS — M545 Low back pain, unspecified: Secondary | ICD-10-CM

## 2021-02-22 DIAGNOSIS — G8929 Other chronic pain: Secondary | ICD-10-CM

## 2021-02-22 MED ORDER — FLUOXETINE HCL 10 MG PO TABS: 10.0000 mg | ORAL_TABLET | Freq: Every day | ORAL | 3 refills | Status: DC

## 2021-02-22 MED ORDER — TRAZODONE HCL 100 MG PO TABS: 50.0000 mg | ORAL_TABLET | Freq: Every day | ORAL | 2 refills | Status: DC

## 2021-02-22 NOTE — Assessment & Plan Note (Signed)
Chronic. Following with Dr Sharlet Salina and pursuing PT. Will follow.

## 2021-02-22 NOTE — Assessment & Plan Note (Signed)
Uncontrolled. Decrease trazodone to 50mg . Start prozac 10mg  for more activating SSRI. Continue wellbutrin 300mg . QTc < 460 on last EKG 04/2020. Close follow up.

## 2021-02-22 NOTE — Progress Notes (Signed)
This encounter was created in error - please disregard.

## 2021-02-22 NOTE — Patient Instructions (Signed)
Decrease trazodone to 50mg  at bedtime  Start prozac 10mg   Continue wellbutrin 300mg   Let me know how you are doing

## 2021-03-02 ENCOUNTER — Other Ambulatory Visit: Payer: Self-pay | Admitting: Family

## 2021-03-02 DIAGNOSIS — M5137 Other intervertebral disc degeneration, lumbosacral region: Secondary | ICD-10-CM

## 2021-03-11 ENCOUNTER — Other Ambulatory Visit: Payer: Self-pay

## 2021-03-11 ENCOUNTER — Ambulatory Visit
Admission: RE | Admit: 2021-03-11 | Discharge: 2021-03-11 | Disposition: A | Discharge status: Home / Self Care | Payer: Medicare PPO | Source: Ambulatory Visit | Attending: Family | Admitting: Family

## 2021-03-11 DIAGNOSIS — Z1231 Encounter for screening mammogram for malignant neoplasm of breast: Secondary | ICD-10-CM | POA: Insufficient documentation

## 2021-03-12 ENCOUNTER — Other Ambulatory Visit: Payer: Self-pay | Admitting: Podiatry

## 2021-03-16 ENCOUNTER — Other Ambulatory Visit: Payer: Self-pay | Admitting: Family

## 2021-03-16 DIAGNOSIS — R928 Other abnormal and inconclusive findings on diagnostic imaging of breast: Secondary | ICD-10-CM

## 2021-03-16 DIAGNOSIS — N6320 Unspecified lump in the left breast, unspecified quadrant: Secondary | ICD-10-CM

## 2021-03-19 ENCOUNTER — Ambulatory Visit
Admission: RE | Admit: 2021-03-19 | Discharge: 2021-03-19 | Disposition: A | Discharge status: Home / Self Care | Payer: Medicare PPO | Source: Ambulatory Visit | Attending: Family | Admitting: Family

## 2021-03-19 ENCOUNTER — Other Ambulatory Visit: Payer: Self-pay

## 2021-03-19 DIAGNOSIS — R928 Other abnormal and inconclusive findings on diagnostic imaging of breast: Secondary | ICD-10-CM | POA: Diagnosis present

## 2021-03-19 DIAGNOSIS — N6320 Unspecified lump in the left breast, unspecified quadrant: Secondary | ICD-10-CM | POA: Insufficient documentation

## 2021-03-22 ENCOUNTER — Ambulatory Visit: Payer: Medicare PPO | Admitting: Family

## 2021-03-22 ENCOUNTER — Encounter: Payer: Self-pay | Admitting: Podiatry

## 2021-03-22 ENCOUNTER — Encounter: Payer: Self-pay | Admitting: Family

## 2021-03-22 ENCOUNTER — Telehealth: Payer: Self-pay

## 2021-03-22 ENCOUNTER — Other Ambulatory Visit: Payer: Self-pay

## 2021-03-22 VITALS — BP 120/62 | HR 61 | Temp 97.7°F | Ht 59.0 in | Wt 152.4 lb

## 2021-03-22 DIAGNOSIS — Z13220 Encounter for screening for lipoid disorders: Secondary | ICD-10-CM

## 2021-03-22 DIAGNOSIS — M545 Low back pain, unspecified: Secondary | ICD-10-CM

## 2021-03-22 DIAGNOSIS — G8929 Other chronic pain: Secondary | ICD-10-CM

## 2021-03-22 DIAGNOSIS — Z66 Do not resuscitate: Secondary | ICD-10-CM

## 2021-03-22 DIAGNOSIS — Z136 Encounter for screening for cardiovascular disorders: Secondary | ICD-10-CM

## 2021-03-22 DIAGNOSIS — Z01818 Encounter for other preprocedural examination: Secondary | ICD-10-CM

## 2021-03-22 DIAGNOSIS — M5137 Other intervertebral disc degeneration, lumbosacral region: Secondary | ICD-10-CM

## 2021-03-22 MED ORDER — BACLOFEN 10 MG PO TABS: 10.0000 mg | ORAL_TABLET | Freq: Three times a day (TID) | ORAL | 3 refills | Status: DC | PRN

## 2021-03-22 MED ORDER — MELOXICAM 7.5 MG PO TABS: 7.5000 mg | ORAL_TABLET | Freq: Every day | ORAL | 1 refills | Status: DC | PRN

## 2021-03-22 NOTE — Assessment & Plan Note (Addendum)
We had a long discussion in regards to what DO NOT RESUSCITATE order would mean.  She does not want  the use of artificial or heroic means to be kept alive. She would like me to write an order in the chart that if patient was to die, no attempt to resuscitate will be made.  She would like comfort care and has completed this in her Living will. Patient's daughter, Claudia Andersen is her health care power of attorney.  All questions answered and patient verbalized understanding of all. Patient has signed DO NOT RESUSCITATE order and this has been scanned to chart .She will retain original copy.

## 2021-03-22 NOTE — Telephone Encounter (Signed)
LMTCB to let her know that surgical clearance paperwork was ready & I would place upfront for her to pick up.

## 2021-03-22 NOTE — Progress Notes (Signed)
Subjective:    Patient ID: Claudia Andersen, female    DOB: 02/18/1941, 80 y.o.   MRN: [id number]  CC: Claudia Andersen is a 80 y.o. female who presents today for follow up.   HPI: Here for pre op visit for upcoming right bunion 04/01/21  Podiatrist: Dr Luana Shu  She is staying active and excited as she has lost 10 lbs intentionally. She is walking daily, working in yard.    No cp, sob with yard work or walking.   She also has with her  DNR signed. She lives at [place] and will post on her refrigerator. Her daughter is her POA and she would like comfort care such as pain management, IV fluids but declines heroic, life saving measures. She has a Living Will at home.      She would like refill of mobic 7.5mg  for joint pain after working in the yard recently.  Reports muscle spasms across low back which are unchanged from prior. Baclofen 5mg  is helpful however 10mg  as been more helpful . She would like new prescription.  No dysuria, abdominal pain.    Mammogram has resulted normal after left breast US. Return to  Normal mammogram schedule  Consult with Dr Ubaldo Glassing 02/2020 for cardiomegaly. No stress test.  Echo showed  NORMAL LEFT VENTRICULAR SYSTOLIC FUNCTION NORMAL RIGHT VENTRICULAR SYSTOLIC FUNCTION MILD VALVULAR REGURGITATION (See above) NO VALVULAR STENOSIS. EF > 55%   HISTORY:  Past Medical History:  Diagnosis Date  . Ankle fracture, bimalleolar, closed, right, initial encounter 10/26/2018   d/t fall 10/26/18  . Arthritis    generalized  . Back abrasion    CRUSHED VERTEBRAE  . Cataract    bilateral sx   . Closed right radial fracture 10/26/2018   s/p a fall  . Compression of lumbar vertebra ([place])    L1 and L4 (from fall November 05, 2018)  . Degenerative disc disease, lumbar   . Depression    hx of-on meds  . Gallstones   . GERD (gastroesophageal reflux disease)    on meds  . Migraines   . Osteoporosis    on Prolia inj  . Pulmonary emboli (HCC)    history of emboli    Past Surgical History:  Procedure Laterality Date  . ABDOMINAL HYSTERECTOMY    . APPENDECTOMY    . CATARACT EXTRACTION W/PHACO Right 03/27/2018   Procedure: CATARACT EXTRACTION PHACO AND INTRAOCULAR LENS PLACEMENT (IOC);  Surgeon: Birder Robson, MD;  Location: [facility];  Service: Ophthalmology;  Laterality: Right;  [place] 00:33 AP% 15.3 CDE 5.18 Fluid pack lot # [id number]  . CATARACT EXTRACTION W/PHACO Left 06/26/2018   Procedure: CATARACT EXTRACTION PHACO AND INTRAOCULAR LENS PLACEMENT ([place]);  Surgeon: Birder Robson, MD;  Location: [facility];  Service: Ophthalmology;  Laterality: Left;  [place] 00:38 AP% 15.2 CDE 5.80 Fluid pack lot # [id number]  . FRACTURE SURGERY Right    RIGHT RADIAL wrist  . TONSILLECTOMY AND ADENOIDECTOMY    . 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 TOTAL KNEE ARTHROPLASTY Left   . TRIPLE SUBTALAR FUSION    . WISDOM TOOTH EXTRACTION     Family History  Problem Relation Age of Onset  . Arthritis Mother   . Stroke Mother   . Hemachromatosis Mother   . Heart disease Father   . Pneumonia Maternal Grandmother   . Heart  disease Maternal Grandfather   . Heart disease Paternal Grandmother   . Heart disease Paternal Grandfather   . Breast cancer Neg Hx   . Colon cancer Neg Hx   . Colon polyps Neg Hx   . Esophageal cancer Neg Hx   . Rectal cancer Neg Hx   . Stomach cancer Neg Hx     Allergies: Patient has no known allergies. Current Outpatient Medications on File Prior to Visit  Medication Sig Dispense Refill  . acetaminophen (TYLENOL) 500 MG tablet Take 2 tablets (1,000 mg total) by mouth every 8 (eight) hours as needed for mild pain, moderate pain, fever or headache. 50 tablet 0  . Biotin 2500 MCG CAPS Take 2,500 mcg by mouth daily.    Marland Kitchen buPROPion (WELLBUTRIN XL) 300 MG 24 hr tablet Take 1 tablet (300 mg  total) by mouth daily. 90 tablet 1  . denosumab (PROLIA) 60 MG/ML SOSY injection Inject 60 mg into the skin every 6 (six) months.    . diclofenac Sodium (VOLTAREN) 1 % GEL Apply 4 g topically 4 (four) times daily. (Patient taking differently: Apply 4 g topically 4 (four) times daily as needed.) 100 g 1  . Fluticasone Furoate (FLONASE SENSIMIST NA) Place 1 spray into both nostrils daily as needed (allergies).     . fluvoxaMINE (LUVOX) 100 MG tablet Take 100 mg by mouth daily.    Marland Kitchen gabapentin (NEURONTIN) 100 MG capsule Take 2 capsules (200 mg total) by mouth 2 (two) times daily. Take 200mg  po in the morning and one 200mg  po at noon. (Patient taking differently: Take 200 mg by mouth 2 (two) times daily. Take 200mg  po in the morning and one 200mg  po at noon.  (03/22/21 pt takes 200 mg AM, 300 mg PM)) 360 capsule 1  . gabapentin (NEURONTIN) 300 MG capsule TAKE 1 CAPSULE BY MOUTH NIGHTLY 90 capsule 1  . hydrochlorothiazide (MICROZIDE) 12.5 MG capsule TAKE 1 CAPSULE BY MOUTH EVERY DAY AS NEEDED 30 capsule 1  . lidocaine (LIDODERM) 5 % Place 1 patch onto the skin every 12 (twelve) hours. Remove & Discard patch within 12 hours or as directed by MD 10 patch 0  . PREVIDENT 5000 PLUS 1.1 % CREA dental cream Take by mouth in the morning and at bedtime.    . traZODone (DESYREL) 100 MG tablet Take 0.5 tablets (50 mg total) by mouth at bedtime. 90 tablet 2  . triamcinolone (KENALOG) 0.025 % cream Apply 1 application topically 2 (two) times daily. 80 g 1  . valACYclovir (VALTREX) 1000 MG tablet TAKE TWO TABLETS BY MOUTH EVERY 12 HOURSFOR 1 DAY. **START AS SOON AS POSSIBLE AFTER SYMPTOM ONSET** 6 tablet 1   No current facility-administered medications on file prior to visit.    Social History   Tobacco Use  . Smoking status: Former Smoker    Packs/day: 0.50    Years: 22.00    Pack years: 11.00    Types: Cigarettes    Quit date: 1982    Years since quitting: 40.4  . Smokeless tobacco: Never Used  Vaping Use   . Vaping Use: Never used  Substance Use Topics  . Alcohol use: Yes    Alcohol/week: 10.0 standard drinks    Types: 10 Standard drinks or equivalent per week  . Drug use: Never    Review of Systems  Constitutional: Negative for chills and fever.  Respiratory: Negative for cough.   Cardiovascular: Negative for chest pain and palpitations.  Gastrointestinal: Negative for nausea and  vomiting.  Musculoskeletal: Positive for back pain (chronic).      Objective:    BP 120/62 (BP Location: Left Arm, Patient Position: Sitting, Cuff Size: Large)   Pulse 61   Temp 97.7 F (36.5 C) (Oral)   Ht 4\' 11"  (1.499 m)   Wt 152 lb 6.4 oz (69.1 kg)   SpO2 98%   BMI 30.78 kg/m  BP Readings from Last 3 Encounters:  03/22/21 120/62  02/22/21 122/72  01/25/21 (!) 142/76   Wt Readings from Last 3 Encounters:  03/22/21 152 lb 6.4 oz (69.1 kg)  02/22/21 157 lb (71.2 kg)  02/19/21 161 lb (73 kg)    Physical Exam Vitals reviewed.  Constitutional:      Appearance: She is well-developed.  Eyes:     Conjunctiva/sclera: Conjunctivae normal.  Cardiovascular:     Rate and Rhythm: Normal rate and regular rhythm.     Pulses: Normal pulses.     Heart sounds: Normal heart sounds.  Pulmonary:     Effort: Pulmonary effort is normal.     Breath sounds: Normal breath sounds. No wheezing, rhonchi or rales.  Skin:    General: Skin is warm and dry.  Neurological:     Mental Status: She is alert.  Psychiatric:        Speech: Speech normal.        Behavior: Behavior normal.        Thought Content: Thought content normal.        Assessment & Plan:   Problem List Items Addressed This Visit      Other   DNR (do not resuscitate)    We had a long discussion in regards to what DO NOT RESUSCITATE order would mean.  She does not want  the use of artificial or heroic means to be kept alive. She would like me to write an order in the chart that if patient was to die, no attempt to resuscitate will be  made.  She would like comfort care and has completed this in her Living will. Patient's daughter, Lilli Few is her health care power of attorney.  All questions answered and patient verbalized understanding of all. Patient has signed DO NOT RESUSCITATE order and this has been scanned to chart .She will retain original copy.         Low back pain    Chronic. Unchanged. Increased baclofen to 10mg  tid prn, refilled mobic 7.5mg  qd prn. She will let me know of new concerns to we can further evaluate.       Relevant Medications   meloxicam (MOBIC) 7.5 MG tablet   baclofen (LIORESAL) 10 MG tablet   Preoperative examination - Primary    EKG shows NSR. No acute changes when compared to prior 04/2020. T wave inversions are not new. She denies any CP, sob. Pending labs ahead of surgery. She is moderate risk for surgery, anesthesia due to age, comorbidities. Case reviewed with supervising, Dr Deborra Medina, and she and I jointly agreed on management plan.        Relevant Orders   CBC with Differential/Platelet   Comprehensive metabolic panel   EKG 28-MKLK (Completed)    Other Visit Diagnoses    DDD (degenerative disc disease), lumbosacral       Relevant Medications   meloxicam (MOBIC) 7.5 MG tablet   baclofen (LIORESAL) 10 MG tablet   Encounter for lipid screening for cardiovascular disease       Relevant Orders   Lipid panel  I have discontinued [name] "Sandy"'s pantoprazole and FLUoxetine. I have also changed her baclofen. Additionally, I am having her start on meloxicam. Lastly, I am having her maintain her Fluticasone Furoate (FLONASE SENSIMIST NA), Biotin, lidocaine, acetaminophen, diclofenac Sodium, Prolia, triamcinolone, buPROPion, PreviDent 5000 Plus, gabapentin, hydrochlorothiazide, valACYclovir, gabapentin, traZODone, and fluvoxaMINE.   Meds ordered this encounter  Medications  . meloxicam (MOBIC) 7.5 MG tablet    Sig: Take 1 tablet (7.5 mg total) by mouth  daily as needed for pain.    Dispense:  30 tablet    Refill:  1    Order Specific Question:   Supervising Provider    Answer:   Deborra Medina L [[id number]]  . baclofen (LIORESAL) 10 MG tablet    Sig: Take 1 tablet (10 mg total) by mouth 3 (three) times daily as needed for muscle spasms.    Dispense:  90 each    Refill:  3    Order Specific Question:   Supervising Provider    Answer:   Crecencio Mc [[id number]]    Return precautions given.   Risks, benefits, and alternatives of the medications and treatment plan prescribed today were discussed, and patient expressed understanding.   Education regarding symptom management and diagnosis given to patient on AVS.  Continue to follow with Burnard Hawthorne, FNP for routine health maintenance.   [name] and I agreed with plan.   Mable Paris, FNP

## 2021-03-22 NOTE — Patient Instructions (Addendum)
Nice see you!

## 2021-03-22 NOTE — Assessment & Plan Note (Signed)
Chronic. Unchanged. Increased baclofen to 10mg  tid prn, refilled mobic 7.5mg  qd prn. She will let me know of new concerns to we can further evaluate.

## 2021-03-22 NOTE — Assessment & Plan Note (Signed)
EKG shows NSR. No acute changes when compared to prior 04/2020. T wave inversions are not new. She denies any CP, sob. Pending labs ahead of surgery. She is moderate risk for surgery, anesthesia due to age, comorbidities. Case reviewed with supervising, Dr Deborra Medina, and she and I jointly agreed on management plan.

## 2021-03-25 ENCOUNTER — Other Ambulatory Visit: Payer: Self-pay

## 2021-03-25 ENCOUNTER — Other Ambulatory Visit (INDEPENDENT_AMBULATORY_CARE_PROVIDER_SITE_OTHER): Payer: Medicare PPO

## 2021-03-25 ENCOUNTER — Other Ambulatory Visit: Payer: Medicare PPO

## 2021-03-25 DIAGNOSIS — Z01818 Encounter for other preprocedural examination: Secondary | ICD-10-CM | POA: Diagnosis not present

## 2021-03-25 DIAGNOSIS — Z136 Encounter for screening for cardiovascular disorders: Secondary | ICD-10-CM | POA: Diagnosis not present

## 2021-03-25 DIAGNOSIS — Z13220 Encounter for screening for lipoid disorders: Secondary | ICD-10-CM | POA: Diagnosis not present

## 2021-03-25 LAB — CBC WITH DIFFERENTIAL/PLATELET
Basophils Absolute: 0.1 10*3/uL (ref 0.0–0.1)
Basophils Relative: 1.1 % (ref 0.0–3.0)
Eosinophils Absolute: 0.2 10*3/uL (ref 0.0–0.7)
Eosinophils Relative: 2.9 % (ref 0.0–5.0)
HCT: 35.7 % — ABNORMAL LOW (ref 36.0–46.0)
Hemoglobin: 12.2 g/dL (ref 12.0–15.0)
Lymphocytes Relative: 24.4 % (ref 12.0–46.0)
Lymphs Abs: 1.4 10*3/uL (ref 0.7–4.0)
MCHC: 34.1 g/dL (ref 30.0–36.0)
MCV: 96.6 fl (ref 78.0–100.0)
Monocytes Absolute: 0.7 10*3/uL (ref 0.1–1.0)
Monocytes Relative: 12.8 % — ABNORMAL HIGH (ref 3.0–12.0)
Neutro Abs: 3.3 10*3/uL (ref 1.4–7.7)
Neutrophils Relative %: 58.8 % (ref 43.0–77.0)
Platelets: 242 10*3/uL (ref 150.0–400.0)
RBC: 3.7 Mil/uL — ABNORMAL LOW (ref 3.87–5.11)
RDW: 13.1 % (ref 11.5–15.5)
WBC: 5.7 10*3/uL (ref 4.0–10.5)

## 2021-03-25 LAB — LIPID PANEL
Cholesterol: 195 mg/dL (ref 0–200)
HDL: 80 mg/dL (ref >39.00)
LDL Cholesterol: 102 mg/dL — ABNORMAL HIGH (ref 0–99)
NonHDL: 114.56
Total CHOL/HDL Ratio: 2
Triglycerides: 62 mg/dL (ref 0.0–149.0)
VLDL: 12.4 mg/dL (ref 0.0–40.0)

## 2021-03-25 LAB — COMPREHENSIVE METABOLIC PANEL
ALT: 14 U/L (ref 0–35)
AST: 21 U/L (ref 0–37)
Albumin: 4.3 g/dL (ref 3.5–5.2)
Alkaline Phosphatase: 46 U/L (ref 39–117)
BUN: 15 mg/dL (ref 6–23)
CO2: 27 mEq/L (ref 19–32)
Calcium: 9.1 mg/dL (ref 8.4–10.5)
Chloride: 102 mEq/L (ref 96–112)
Creatinine, Ser: 0.86 mg/dL (ref 0.40–1.20)
GFR: 64.52 mL/min (ref >60.00)
Glucose, Bld: 87 mg/dL (ref 70–99)
Potassium: 3.5 mEq/L (ref 3.5–5.1)
Sodium: 139 mEq/L (ref 135–145)
Total Bilirubin: 0.6 mg/dL (ref 0.2–1.2)
Total Protein: 6.8 g/dL (ref 6.0–8.3)

## 2021-03-26 NOTE — Discharge Instructions (Addendum)
Claudia Andersen  POST OPERATIVE INSTRUCTIONS FOR DR. Vickki Muff AND DR. Breathitt   Take your medication as prescribed.  Pain medication should be taken only as needed.  You may also take Tylenol or ibuprofen for relief as needed in addition to pain medication.  Keep the dressing clean, dry and intact.  Keep your foot elevated above the heart level for the first 48 hours.  Continue elevation thereafter to improve swelling.  Walking to the bathroom and brief periods of walking are acceptable, unless we have instructed you to be non-weight bearing.  He should only be up and walking for short distances and try to walk with heel contact until your incision heals, will likely take around 2 to 3 weeks for the incision to heal.  Always wear your post-op shoe when walking.   Do not take a shower. Baths are permissible as long as the foot is kept out of the water.   Every hour you are awake:  Bend your knee 15 times. Flex foot 15 times Massage calf 15 times  Call [facility] [phone]) if any of the following problems occur: You develop a temperature or fever. The bandage becomes saturated with blood. Medication does not stop your pain. Injury of the foot occurs. Any symptoms of infection including redness, odor, or red streaks running from wound.   Information for Discharge Teaching: EXPAREL (bupivacaine liposome injectable suspension)   Your surgeon or anesthesiologist gave you EXPAREL(bupivacaine) to help control your pain after surgery.  EXPAREL is a local anesthetic that provides pain relief by numbing the tissue around the surgical site. EXPAREL is designed to release pain medication over time and can control pain for up to 72 hours. Depending on how you respond to EXPAREL, you may require less pain medication during your recovery.  Possible side effects: Temporary loss of sensation or ability to move in  the area where bupivacaine was injected. Nausea, vomiting, constipation Rarely, numbness and tingling in your mouth or lips, lightheadedness, or anxiety may occur. Call your doctor right away if you think you may be experiencing any of these sensations, or if you have other questions regarding possible side effects.  Follow all other discharge instructions given to you by your surgeon or nurse. Eat a healthy diet and drink plenty of water or other fluids.  If you return to the hospital for any reason within 96 hours following the administration of EXPAREL, it is important for health care providers to know that you have received this anesthetic. A teal colored band has been placed on your arm with the date, time and amount of EXPAREL you have received in order to alert and inform your health care providers. Please leave this armband in place for the full 96 hours following administration, and then you may remove the band.

## 2021-03-27 ENCOUNTER — Encounter: Payer: Self-pay | Admitting: Family

## 2021-03-29 ENCOUNTER — Other Ambulatory Visit: Payer: Self-pay | Admitting: Family

## 2021-03-29 NOTE — Telephone Encounter (Signed)
Please advise   Patient last seen 03/22/21 and other labs last done 03/25/21.

## 2021-03-30 ENCOUNTER — Other Ambulatory Visit: Payer: Self-pay

## 2021-03-30 DIAGNOSIS — R7989 Other specified abnormal findings of blood chemistry: Secondary | ICD-10-CM

## 2021-03-30 DIAGNOSIS — Z1321 Encounter for screening for nutritional disorder: Secondary | ICD-10-CM

## 2021-04-01 ENCOUNTER — Encounter: Payer: Self-pay | Admitting: Podiatry

## 2021-04-01 ENCOUNTER — Encounter
Admission: RE | Disposition: A | Discharge status: Home / Self Care | Payer: Self-pay | Source: Home / Self Care | Attending: Podiatry

## 2021-04-01 ENCOUNTER — Ambulatory Visit: Payer: Medicare PPO | Admitting: Anesthesiology

## 2021-04-01 ENCOUNTER — Ambulatory Visit
Admission: RE | Admit: 2021-04-01 | Discharge: 2021-04-01 | Disposition: A | Discharge status: Home / Self Care | Payer: Medicare PPO | Attending: Podiatry | Admitting: Podiatry

## 2021-04-01 ENCOUNTER — Other Ambulatory Visit: Payer: Self-pay

## 2021-04-01 DIAGNOSIS — Z87891 Personal history of nicotine dependence: Secondary | ICD-10-CM | POA: Insufficient documentation

## 2021-04-01 DIAGNOSIS — Z09 Encounter for follow-up examination after completed treatment for conditions other than malignant neoplasm: Secondary | ICD-10-CM

## 2021-04-01 DIAGNOSIS — M21621 Bunionette of right foot: Secondary | ICD-10-CM | POA: Insufficient documentation

## 2021-04-01 DIAGNOSIS — M25774 Osteophyte, right foot: Secondary | ICD-10-CM | POA: Insufficient documentation

## 2021-04-01 DIAGNOSIS — M899 Disorder of bone, unspecified: Secondary | ICD-10-CM | POA: Diagnosis not present

## 2021-04-01 DIAGNOSIS — M21622 Bunionette of left foot: Secondary | ICD-10-CM

## 2021-04-01 DIAGNOSIS — Z79899 Other long term (current) drug therapy: Secondary | ICD-10-CM | POA: Insufficient documentation

## 2021-04-01 DIAGNOSIS — L84 Corns and callosities: Secondary | ICD-10-CM | POA: Diagnosis not present

## 2021-04-01 HISTORY — DX: Other intervertebral disc degeneration, lumbar region: M51.36

## 2021-04-01 HISTORY — DX: Other intervertebral disc degeneration, lumbar region without mention of lumbar back pain or lower extremity pain: M51.369

## 2021-04-01 HISTORY — PX: METATARSAL HEAD EXCISION: SHX5027

## 2021-04-01 SURGERY — EXCISION, METATARSAL BONE, HEAD
Anesthesia: General | Site: Toe | Laterality: Right

## 2021-04-01 MED ORDER — FENTANYL CITRATE (PF) 100 MCG/2ML IJ SOLN: 25.0000 ug | INTRAMUSCULAR | Status: DC | PRN

## 2021-04-01 MED ORDER — LACTATED RINGERS IV SOLN: INTRAVENOUS | Status: DC

## 2021-04-01 MED ORDER — BUPIVACAINE HCL (PF) 0.5 % IJ SOLN
INTRAMUSCULAR | Status: DC | PRN
  Administered 2021-04-01: 20 mL

## 2021-04-01 MED ORDER — PROMETHAZINE HCL 25 MG/ML IJ SOLN: 6.2500 mg | INTRAMUSCULAR | Status: DC | PRN

## 2021-04-01 MED ORDER — BUPIVACAINE LIPOSOME 1.3 % IJ SUSP
INTRAMUSCULAR | Status: DC | PRN
  Administered 2021-04-01: 5 mL

## 2021-04-01 MED ORDER — OXYCODONE HCL 5 MG/5ML PO SOLN: 5.0000 mg | Freq: Once | ORAL | Status: DC | PRN

## 2021-04-01 MED ORDER — CEFAZOLIN SODIUM-DEXTROSE 2-4 GM/100ML-% IV SOLN
2.0000 g | INTRAVENOUS | Status: AC
  Administered 2021-04-01: 2 g via INTRAVENOUS

## 2021-04-01 MED ORDER — LIDOCAINE HCL (CARDIAC) PF 100 MG/5ML IV SOSY
PREFILLED_SYRINGE | INTRAVENOUS | Status: DC | PRN
  Administered 2021-04-01: 60 mg via INTRAVENOUS

## 2021-04-01 MED ORDER — POVIDONE-IODINE 7.5 % EX SOLN: Freq: Once | CUTANEOUS | Status: AC

## 2021-04-01 MED ORDER — PROPOFOL 500 MG/50ML IV EMUL
INTRAVENOUS | Status: DC | PRN
  Administered 2021-04-01: 50 ug/kg/min via INTRAVENOUS

## 2021-04-01 MED ORDER — ONDANSETRON HCL 4 MG/2ML IJ SOLN
INTRAMUSCULAR | Status: DC | PRN
  Administered 2021-04-01: 4 mg via INTRAVENOUS

## 2021-04-01 MED ORDER — FENTANYL CITRATE (PF) 100 MCG/2ML IJ SOLN
INTRAMUSCULAR | Status: DC | PRN
  Administered 2021-04-01: 50 ug via INTRAVENOUS

## 2021-04-01 MED ORDER — HYDROCODONE-ACETAMINOPHEN 5-325 MG PO TABS: 1.0000 | ORAL_TABLET | Freq: Four times a day (QID) | ORAL | 0 refills | Status: AC | PRN

## 2021-04-01 MED ORDER — MIDAZOLAM HCL 2 MG/2ML IJ SOLN
INTRAMUSCULAR | Status: DC | PRN
  Administered 2021-04-01: 1 mg via INTRAVENOUS

## 2021-04-01 MED ORDER — OXYCODONE HCL 5 MG PO TABS: 5.0000 mg | ORAL_TABLET | Freq: Once | ORAL | Status: DC | PRN

## 2021-04-01 SURGICAL SUPPLY — 28 items
BLADE MED AGGRESSIVE (BLADE) ×2 IMPLANT
BNDG CMPR STD VLCR NS LF 5.8X4 (GAUZE/BANDAGES/DRESSINGS) ×1
BNDG CMPR STD VLCR NS LF 5.8X6 (GAUZE/BANDAGES/DRESSINGS) ×1
BNDG ELASTIC 4X5.8 VLCR NS LF (GAUZE/BANDAGES/DRESSINGS) ×3 IMPLANT
BNDG ELASTIC 6X5.8 VLCR NS LF (GAUZE/BANDAGES/DRESSINGS) ×3 IMPLANT
BNDG ESMARK 4X12 TAN STRL LF (GAUZE/BANDAGES/DRESSINGS) ×3 IMPLANT
CANISTER SUCT 1200ML W/VALVE (MISCELLANEOUS) ×3 IMPLANT
COVER LIGHT HANDLE UNIVERSAL (MISCELLANEOUS) ×6 IMPLANT
CUFF TOURN SGL QUICK 18X4 (TOURNIQUET CUFF) ×3 IMPLANT
DRAPE FLUOR MINI C-ARM 54X84 (DRAPES) ×3 IMPLANT
DURAPREP 26ML APPLICATOR (WOUND CARE) ×3 IMPLANT
ELECT REM PT RETURN 9FT ADLT (ELECTROSURGICAL) ×3
ELECTRODE REM PT RTRN 9FT ADLT (ELECTROSURGICAL) ×1 IMPLANT
GAUZE SPONGE 4X4 12PLY STRL (GAUZE/BANDAGES/DRESSINGS) ×3 IMPLANT
GAUZE XEROFORM 1X8 LF (GAUZE/BANDAGES/DRESSINGS) ×3 IMPLANT
GLOVE SURG ENC MOIS LTX SZ7 (GLOVE) ×3 IMPLANT
GLOVE SURG POLYISO LF SZ7 (GLOVE) ×3 IMPLANT
GOWN STRL REUS W/ TWL LRG LVL3 (GOWN DISPOSABLE) ×2 IMPLANT
GOWN STRL REUS W/TWL LRG LVL3 (GOWN DISPOSABLE) ×6
KIT TURNOVER KIT A (KITS) ×3 IMPLANT
NS IRRIG 500ML POUR BTL (IV SOLUTION) ×3 IMPLANT
PACK EXTREMITY ARMC (MISCELLANEOUS) ×3 IMPLANT
PENCIL SMOKE EVACUATOR (MISCELLANEOUS) ×3 IMPLANT
STOCKINETTE IMPERVIOUS LG (DRAPES) ×3 IMPLANT
SUT ETHILON 4-0 (SUTURE) ×3
SUT ETHILON 4-0 FS2 18XMFL BLK (SUTURE) ×1
SUT VIC AB 4-0 FS2 27 (SUTURE) ×3 IMPLANT
SUTURE ETHLN 4-0 FS2 18XMF BLK (SUTURE) IMPLANT

## 2021-04-01 NOTE — Anesthesia Preprocedure Evaluation (Addendum)
Anesthesia Evaluation  Patient identified by MRN, date of birth, ID band Patient awake    Reviewed: Allergy & Precautions, NPO status , Patient's Chart, lab work & pertinent test results, reviewed documented beta blocker date and time   History of Anesthesia Complications Negative for: history of anesthetic complications  Airway Mallampati: II  TM Distance: >3 FB Neck ROM: Limited    Dental   Pulmonary former smoker, PE (2013)   breath sounds clear to auscultation       Cardiovascular hypertension, (-) angina+ Peripheral Vascular Disease (Carotid disease, minimal on recent ultrasound)  (-) DOE  Rhythm:Regular Rate:Normal   HLD   Neuro/Psych  Headaches, PSYCHIATRIC DISORDERS Anxiety Depression    GI/Hepatic GERD  Controlled,  Endo/Other    Renal/GU      Musculoskeletal  (+) Arthritis ,   Abdominal   Peds  Hematology  (+) anemia ,   Anesthesia Other Findings   Reproductive/Obstetrics                           Anesthesia Physical Anesthesia Plan  ASA: 2  Anesthesia Plan: General   Post-op Pain Management:    Induction: Intravenous  PONV Risk Score and Plan: 3 and Propofol infusion, TIVA and Treatment may vary due to age or medical condition  Airway Management Planned: Natural Airway and Nasal Cannula  Additional Equipment:   Intra-op Plan:   Post-operative Plan:   Informed Consent: I have reviewed the patients History and Physical, chart, labs and discussed the procedure including the risks, benefits and alternatives for the proposed anesthesia with the patient or authorized representative who has indicated his/her understanding and acceptance.       Plan Discussed with: CRNA and Anesthesiologist  Anesthesia Plan Comments:       Anesthesia Quick Evaluation

## 2021-04-01 NOTE — Transfer of Care (Signed)
Immediate Anesthesia Transfer of Care Note  Patient: Claudia Andersen  Procedure(s) Performed: METATARSAL HEAD EXCISION- Tailors Exostectomy -Right (Right: Toe)  Patient Location: PACU  Anesthesia Type: General  Level of Consciousness: awake, alert  and patient cooperative  Airway and Oxygen Therapy: Patient Spontanous Breathing and Patient connected to supplemental oxygen  Post-op Assessment: Post-op Vital signs reviewed, Patient's Cardiovascular Status Stable, Respiratory Function Stable, Patent Airway and No signs of Nausea or vomiting  Post-op Vital Signs: Reviewed and stable  Complications: No notable events documented.

## 2021-04-01 NOTE — H&P (Signed)
HISTORY AND PHYSICAL INTERVAL NOTE:  04/01/2021  10:03 AM  Claudia Andersen  has presented today for surgery, with the diagnosis of M25.70- Exostosis/Osteophyte, Q66.6- 5th med abd valgus, tailors bunion right foot.  The various methods of treatment have been discussed with the patient.  No guarantees were given.  After consideration of risks, benefits and other options for treatment, the patient has consented to surgery.  I have reviewed the patients' chart and labs.    PROCEDURE: RIGHT 5TH TAILORS BUNIONECTOMY WITHOUT OSTEOTOMY  A history and physical examination was performed in my office.  The patient was reexamined.  There have been no changes to this history and physical examination.  Caroline More, DPM

## 2021-04-01 NOTE — Op Note (Signed)
PODIATRY / FOOT AND ANKLE SURGERY OPERATIVE REPORT    SURGEON: Caroline More, DPM  PRE-OPERATIVE DIAGNOSIS:  1.  Right tailor's bunion  POST-OPERATIVE DIAGNOSIS: Same  PROCEDURE(S): Right tailor's bunionectomy without osteotomy  HEMOSTASIS: Right ankle tourniquet  ANESTHESIA: MAC  ESTIMATED BLOOD LOSS: 10 cc  FINDING(S): 1.  Large exostosis present over the right fifth metatarsal phalangeal joint with enlarged lateral eminence.  PATHOLOGY/SPECIMEN(S): None  INDICATIONS:   Claudia Andersen is a 80 y.o. female who presents with a painful tailor's bunion to the right foot with exostosis at the dorsal lateral aspect of the fifth metatarsal head.  Patient has exhausted conservative measures and would like to proceed with surgical intervention today.  X-ray imaging was reviewed and discussed with patient in detail.  All treatment options were discussed with the patient both conservative and surgical attempts at correction include potential risks and complications at this time patient is elected for surgical procedure listed above.  DESCRIPTION: After obtaining full informed written consent, the patient was brought back to the operating room and placed supine upon the operating table.  A pneumatic ankle tourniquet was applied about the patient's right ankle.  The patient received IV antibiotics prior to induction.  After obtaining adequate anesthesia, a block was performed with 20 cc of half percent Marcaine plain in a reverse Mayo type block around the fifth ray.  The patient was prepped and draped in the standard fashion.  Attention was directed to the fifth metatarsal phalangeal joint where a 3-1 type of elliptical incision was made around the callus formation at the dorsal lateral aspect of the fifth metatarsal phalangeal joint that extends across the fifth metatarsal phalangeal joint.  The skin was ellipsed and passed off the operative site.  Blunt dissection was then continued down through  the subcutaneous tissues utilizing sharp and blunt dissection and care was taken to identify and retract all vital neurovascular structures and all venous contributories were cauterized as necessary.  At this time a capsular incision was made across the fifth metatarsal phalangeal joint and the capsular tissue was reflected medially and laterally thereby exposing the fifth metatarsal phalangeal joint at the operative site.  There appeared to be a large dorsal exostosis present at the level of the joint underneath the callus formation which is likely causing the patient's callus to continue to reform.  Patient also had an enlargement of the lateral eminence.  The lateral eminence as well as dorsal prominence was resected with a sagittal saw and passed off in the operative site and smoothed off with a sagittal bone saw and cleaned up with a rongeur.  The surgical site was flushed with copious amounts normal sterile saline.  Through the same incision a flexor tendon release was performed to slightly straighten the fifth toe as well through the same incision site.  The fifth toe appeared to sit in a rectus position overall.  The surgical site was flushed with copious amounts normal sterile saline.  C-arm imaging was utilized to verify proper removal of the lateral eminence and dorsal bone spur which appeared to be resected adequately.  The capsular and periosteal tissue was then reapproximated well coapted with 4-0 Vicryl.  Subcutaneous tissue was reapproximated well coapted 4-0 Vicryl.  The skin was then reapproximated well coapted with 4-0 nylon in a combination of simple and horizontal mattress type stitching.  An additional 5 cc of Exparel was injected about the incision site.  The pneumatic ankle tourniquet was deflated and a prompt hyperemic response  was noted all digits of the right foot.  A postoperative dressing was then applied consisting of Xeroform to the incision line followed by 4 x 4 gauze, Kerlix, Ace  wrap with mild compression.  Patient tolerated the procedure and anesthesia well was transferred to recovery room vital signs stable vascular status intact to all toes the right foot.  Patient will be discharged home with the appropriate orders and follow-up instructions.  Medications also sent to pharmacy.  Patient should be partial weightbearing in the surgical shoe with heel contact for short distances and try to stay off the feet for prolonged periods of time over the next 2 to 3 weeks until the incision heals.  Patient to follow-up in clinic within 1 week of surgical date.  COMPLICATIONS: None  CONDITION: Good, stable  Caroline More, DPM

## 2021-04-01 NOTE — Anesthesia Postprocedure Evaluation (Signed)
Anesthesia Post Note  Patient: Claudia Andersen  Procedure(s) Performed: METATARSAL HEAD EXCISION- Tailors Exostectomy -Right (Right: Toe)     Patient location during evaluation: PACU Anesthesia Type: General Level of consciousness: awake and alert Pain management: pain level controlled Vital Signs Assessment: post-procedure vital signs reviewed and stable Respiratory status: spontaneous breathing, nonlabored ventilation, respiratory function stable and patient connected to nasal cannula oxygen Cardiovascular status: blood pressure returned to baseline and stable Postop Assessment: no apparent nausea or vomiting Anesthetic complications: no   No notable events documented.  Tylar Amborn A  Eron Goble

## 2021-04-14 ENCOUNTER — Encounter: Payer: Self-pay | Admitting: Family

## 2021-04-27 ENCOUNTER — Other Ambulatory Visit: Payer: Self-pay | Admitting: Family

## 2021-05-12 ENCOUNTER — Other Ambulatory Visit: Payer: Medicare PPO

## 2021-05-19 ENCOUNTER — Other Ambulatory Visit: Payer: Self-pay | Admitting: Family

## 2021-05-19 DIAGNOSIS — F325 Major depressive disorder, single episode, in full remission: Secondary | ICD-10-CM

## 2021-05-20 ENCOUNTER — Other Ambulatory Visit: Payer: Self-pay

## 2021-05-20 ENCOUNTER — Other Ambulatory Visit (INDEPENDENT_AMBULATORY_CARE_PROVIDER_SITE_OTHER): Payer: Medicare PPO

## 2021-05-20 DIAGNOSIS — R7989 Other specified abnormal findings of blood chemistry: Secondary | ICD-10-CM | POA: Diagnosis not present

## 2021-05-20 LAB — CBC WITH DIFFERENTIAL/PLATELET
Basophils Absolute: 0.1 10*3/uL (ref 0.0–0.1)
Basophils Relative: 0.7 % (ref 0.0–3.0)
Eosinophils Absolute: 0.1 10*3/uL (ref 0.0–0.7)
Eosinophils Relative: 0.8 % (ref 0.0–5.0)
HCT: 35.9 % — ABNORMAL LOW (ref 36.0–46.0)
Hemoglobin: 12.2 g/dL (ref 12.0–15.0)
Lymphocytes Relative: 13.3 % (ref 12.0–46.0)
Lymphs Abs: 1.3 10*3/uL (ref 0.7–4.0)
MCHC: 33.9 g/dL (ref 30.0–36.0)
MCV: 98.4 fl (ref 78.0–100.0)
Monocytes Absolute: 0.9 10*3/uL (ref 0.1–1.0)
Monocytes Relative: 9.4 % (ref 3.0–12.0)
Neutro Abs: 7.6 10*3/uL (ref 1.4–7.7)
Neutrophils Relative %: 75.8 % (ref 43.0–77.0)
Platelets: 290 10*3/uL (ref 150.0–400.0)
RBC: 3.65 Mil/uL — ABNORMAL LOW (ref 3.87–5.11)
RDW: 12.9 % (ref 11.5–15.5)
WBC: 10 10*3/uL (ref 4.0–10.5)

## 2021-05-24 ENCOUNTER — Ambulatory Visit: Payer: Medicare PPO | Admitting: Family

## 2021-05-24 ENCOUNTER — Other Ambulatory Visit: Payer: Self-pay

## 2021-05-24 ENCOUNTER — Encounter: Payer: Self-pay | Admitting: Family

## 2021-05-24 VITALS — BP 102/58 | HR 68 | Temp 97.6°F | Ht 59.0 in | Wt 145.8 lb

## 2021-05-24 DIAGNOSIS — M545 Low back pain, unspecified: Secondary | ICD-10-CM | POA: Diagnosis not present

## 2021-05-24 DIAGNOSIS — D649 Anemia, unspecified: Secondary | ICD-10-CM

## 2021-05-24 DIAGNOSIS — R21 Rash and other nonspecific skin eruption: Secondary | ICD-10-CM

## 2021-05-24 DIAGNOSIS — F325 Major depressive disorder, single episode, in full remission: Secondary | ICD-10-CM

## 2021-05-24 DIAGNOSIS — G8929 Other chronic pain: Secondary | ICD-10-CM

## 2021-05-24 MED ORDER — MUPIROCIN 2 % EX OINT
1.0000 | TOPICAL_OINTMENT | Freq: Two times a day (BID) | CUTANEOUS | 1 refills | Status: AC
Start: 2021-05-24 — End: ?

## 2021-05-24 MED ORDER — TRIAMCINOLONE ACETONIDE 0.025 % EX CREA: 1.0000 "application " | TOPICAL_CREAM | Freq: Two times a day (BID) | CUTANEOUS | 1 refills | Status: AC

## 2021-05-24 MED ORDER — CLOTRIMAZOLE 1 % EX OINT
1.0000 | TOPICAL_OINTMENT | Freq: Two times a day (BID) | CUTANEOUS | 1 refills | Status: AC
Start: 2021-05-24 — End: ?

## 2021-05-24 MED ORDER — TRAMADOL HCL 50 MG PO TABS: 25.0000 mg | ORAL_TABLET | Freq: Every day | ORAL | 1 refills | Status: AC | PRN

## 2021-05-24 NOTE — Progress Notes (Signed)
Subjective:    Patient ID: TANIA STEINHAUSER, female    DOB: Nov 25, 1940, 80 y.o.   MRN: [id number]  CC: CORRISA GIBBY is a 80 y.o. female who presents today for follow up.   HPI: Complains of rash bilateral inner thighs x 1 days, unchanged.  Lesions are not spreading.  No tick bites No urinary in  Tried benadryl, cortizone without relief.  Non tender, fever, dysuria.      Low RBCs, hemotocrit Not currently on iron.  Dr Tasia Catchings 11/2020. RBC 3.61, HCT 35.3  Colonoscopy  and EGD 12/18/20. She had one colon polyp. Told by Dr Raquel James not to repeat colonoscopy. EGD showed mild gastritis.  She takes protonix prn.  Chronic low back pain- feels controlled, baseline today. baclofen 10 mg 3 times daily as needed; she is usually taking twice per day. She is OTC lidocaine patches prn. She takes meloxicam 7.5 mg qd with food.  Compliant with gabapentin 258m qam, 2037mmidday and 30070mpm.  Depression anxiety-she is compliant with luvox 100 mg, trazodone 50 mg, and Wellbutrin 300 mg.  She feels depression anxiety are very well controlled  EKG qtc 438 on 03/22/2021     HISTORY:  Past Medical History:  Diagnosis Date   Ankle fracture, bimalleolar, closed, right, initial encounter 10/26/2018   d/t fall 10/26/18   Arthritis    generalized   Back abrasion    CRUSHED VERTEBRAE   Cataract    bilateral sx    Closed right radial fracture 10/26/2018   s/p a fall   Compression of lumbar vertebra (HCC)    L1 and L4 (from fall November 05, 2018)   Degenerative disc disease, lumbar    Depression    hx of-on meds   Gallstones    GERD (gastroesophageal reflux disease)    on meds   Migraines    Osteoporosis    on Prolia inj   Pulmonary emboli (HCC)    history of emboli   Past Surgical History:  Procedure Laterality Date   ABDOMINAL HYSTERECTOMY     APPENDECTOMY     CATARACT EXTRACTION W/PHACO Right 03/27/2018   Procedure: CATARACT EXTRACTION PHACO AND INTRAOCULAR LENS PLACEMENT ([facility]geon:  PorBirder RobsonD;  Location: [facility] OR[facility];  Service: Ophthalmology;  Laterality: Right;  [place]:33 AP% 15.3 CDE 5.18 Fluid pack lot # [id number]  CATARACT EXTRACTION W/PHACO Left 06/26/2018   Procedure: CATARACT EXTRACTION PHACO AND INTRAOCULAR LENS PLACEMENT ([place]geon: PorBirder RobsonD;  Location: [facility] OR[facility];  Service: Ophthalmology;  Laterality: Left;  [place]:38 AP% 15.2 CDE 5.80 Fluid pack lot # [id number]  FRACTURE SURGERY Right    RIGHT RADIAL wrist   METATARSAL HEAD EXCISION Right 04/01/2021   Procedure: METATARSAL HEAD EXCISION- Tailors Exostectomy -Right;  Surgeon: BakCaroline MorePM;  [facility];  Laterality: Right;  CANNOT arrive before 9:30 Anesthesia- Mac + local   TONSILLECTOMY AND ADENOIDECTOMY     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   Procedure: TOTAL HIP ARTHROPLASTY ANTERIOR APPROACH;  Surgeon: BowLovell SheehanD;  Location: [facility] ORS;  Service: Orthopedics;  Laterality: Right;   TOTAL KNEE ARTHROPLASTY Left    TRIPLE SUBTALAR FUSION     WISDOM TOOTH EXTRACTION     Family History  Problem Relation Age of Onset   Arthritis Mother    Stroke Mother    Hemachromatosis Mother    Heart disease Father    Pneumonia Maternal Grandmother  Heart disease Maternal Grandfather    Heart disease Paternal Grandmother    Heart disease Paternal Grandfather    Breast cancer Neg Hx    Colon cancer Neg Hx    Colon polyps Neg Hx    Esophageal cancer Neg Hx    Rectal cancer Neg Hx    Stomach cancer Neg Hx     Allergies: Patient has no known allergies. Current Outpatient Medications on File Prior to Visit  Medication Sig Dispense Refill   baclofen (LIORESAL) 10 MG tablet Take 1 tablet (10 mg total) by mouth 3 (three) times daily as needed for muscle spasms. 90 each 3   Biotin 2500 MCG CAPS Take 2,500 mcg by mouth daily.     buPROPion (WELLBUTRIN XL) 300 MG 24 hr tablet TAKE 1 TABLET BY MOUTH DAILY 90 tablet 1   denosumab (PROLIA) 60  MG/ML SOSY injection Inject 60 mg into the skin every 6 (six) months.     diclofenac Sodium (VOLTAREN) 1 % GEL Apply 4 g topically 4 (four) times daily. (Patient taking differently: Apply 4 g topically 4 (four) times daily as needed.) 100 g 1   Fluticasone Furoate (FLONASE SENSIMIST NA) Place 1 spray into both nostrils daily as needed (allergies).      fluvoxaMINE (LUVOX) 100 MG tablet Take 100 mg by mouth daily.     gabapentin (NEURONTIN) 100 MG capsule Take 2 capsules (200 mg total) by mouth 2 (two) times daily. Take 214m po in the morning and one 2045mpo at noon. (Patient taking differently: Take 200 mg by mouth 2 (two) times daily. Take 20043mo in the morning and one 200m18m at noon.  (03/22/21 pt takes 200 mg AM, 300 mg PM)) 360 capsule 1   gabapentin (NEURONTIN) 300 MG capsule TAKE 1 CAPSULE BY MOUTH NIGHTLY 90 capsule 1   hydrochlorothiazide (MICROZIDE) 12.5 MG capsule TAKE 1 CAPSULE BY MOUTH EVERY DAY AS NEEDED 30 capsule 1   PREVIDENT 5000 PLUS 1.1 % CREA dental cream Take by mouth in the morning and at bedtime.     traZODone (DESYREL) 100 MG tablet Take 0.5 tablets (50 mg total) by mouth at bedtime. 90 tablet 2   valACYclovir (VALTREX) 1000 MG tablet TAKE TWO TABLETS BY MOUTH EVERY 12 HOURSFOR 1 DAY. **START AS SOON AS POSSIBLE AFTER SYMPTOM ONSET** 6 tablet 1   No current facility-administered medications on file prior to visit.    Social History   Tobacco Use   Smoking status: Former    Packs/day: 0.50    Years: 22.00    Pack years: 11.00    Types: Cigarettes    Quit date: 1982    Years since quitting: 40.6   Smokeless tobacco: Never  Vaping Use   Vaping Use: Never used  Substance Use Topics   Alcohol use: Yes    Alcohol/week: 10.0 standard drinks    Types: 10 Standard drinks or equivalent per week   Drug use: Never    Review of Systems  Constitutional:  Negative for chills and fever.  Respiratory:  Negative for cough.   Cardiovascular:  Negative for chest pain and  palpitations.  Gastrointestinal:  Negative for nausea and vomiting.  Musculoskeletal:  Positive for back pain.  Skin:  Positive for rash.     Objective:    BP (!) 102/58 (BP Location: Left Arm, Patient Position: Sitting, Cuff Size: Large)   Pulse 68   Temp 97.6 F (36.4 C) (Oral)   Ht _0  (1.499 m)  Wt 145 lb 12.8 oz (66.1 kg)   SpO2 98%   BMI 29.45 kg/m  BP Readings from Last 3 Encounters:  05/24/21 (!) 102/58  04/01/21 119/62  03/22/21 120/62   Wt Readings from Last 3 Encounters:  05/24/21 145 lb 12.8 oz (66.1 kg)  04/01/21 152 lb (68.9 kg)  03/22/21 152 lb 6.4 oz (69.1 kg)    Physical Exam Vitals reviewed.  Constitutional:      Appearance: She is well-developed.  Eyes:     Conjunctiva/sclera: Conjunctivae normal.  Cardiovascular:     Rate and Rhythm: Normal rate and regular rhythm.     Pulses: Normal pulses.     Heart sounds: Normal heart sounds.  Pulmonary:     Effort: Pulmonary effort is normal.     Breath sounds: Normal breath sounds. No wheezing, rhonchi or rales.  Skin:    General: Skin is warm and dry.       Neurological:     Mental Status: She is alert.  Psychiatric:        Speech: Speech normal.        Behavior: Behavior normal.        Thought Content: Thought content normal.       Assessment & Plan:   Problem List Items Addressed This Visit       Musculoskeletal and Integument   Rash    Etiology nonspecific.  Location and symptoms are consistent with candidiasis.  We will also cover for coexisting bacterial skin infection.  Advised to use Kenalog, Bactroban and clotrimazole.  She will let me know if does not completely resolve.       Relevant Medications   triamcinolone (KENALOG) 0.025 % cream   mupirocin ointment (BACTROBAN) 2 %   Clotrimazole 1 % OINT     Other   Low back pain - Primary    Chronic, stable.  Continue baclofen 10 mg 3 times daily as needed, over-the-counter lidocaine patches.Conitnue gabapentin 290m qam, 203m midday and 30036mpm.   Advised to STOP meloxicam 7.5 mg due to history of gastritis. In its place she will use tramadol 50 mg daily prn.  She has been on tramadol in the past and it worked well for her.  advised her to start with 25 mg that she is also currently on Lovenox, trazodone, and Wellbutrin.  Counseled on symptoms of serotonin syndrome. Close follow up.  I looked up patient on  Controlled Substances Reporting System PMP AWARE and saw no activity that raised concern of inappropriate use.         Relevant Medications   traMADol (ULTRAM) 50 MG tablet   Normocytic anemia    Gastritis seen on EGD earlier this year.  Advised to stop meloxicam and in its place she may use tramadol.         I have discontinued SanShanda Bumpsandy"'s triamcinolone and meloxicam. I am also having her start on traMADol, triamcinolone, mupirocin ointment, and Clotrimazole. Additionally, I am having her maintain her Fluticasone Furoate (FLONASE SENSIMIST NA), Biotin, diclofenac Sodium, Prolia, PreviDent 5000 Plus, gabapentin, gabapentin, traZODone, fluvoxaMINE, baclofen, valACYclovir, hydrochlorothiazide, and buPROPion.   Meds ordered this encounter  Medications   traMADol (ULTRAM) 50 MG tablet    Sig: Take 0.5-1 tablets (25-50 mg total) by mouth daily as needed for up to 5 days.    Dispense:  30 tablet    Refill:  1    Order Specific Question:   Supervising Provider    Answer:  TULLO, TERESA L [[id number]]   triamcinolone (KENALOG) 0.025 % cream    Sig: Apply 1 application topically 2 (two) times daily.    Dispense:  15 g    Refill:  1    Order Specific Question:   Supervising Provider    Answer:   Deborra Medina L [[id number]]   mupirocin ointment (BACTROBAN) 2 %    Sig: Apply 1 application topically 2 (two) times daily.    Dispense:  22 g    Refill:  1    Order Specific Question:   Supervising Provider    Answer:   Deborra Medina L [[id number]]   Clotrimazole 1 % OINT    Sig: Apply 1 application topically 2  (two) times daily.    Dispense:  56.7 g    Refill:  1    Order Specific Question:   Supervising Provider    Answer:   Crecencio Mc [[id number]]    Return precautions given.   Risks, benefits, and alternatives of the medications and treatment plan prescribed today were discussed, and patient expressed understanding.   Education regarding symptom management and diagnosis given to patient on AVS.  Continue to follow with Burnard Hawthorne, FNP for routine health maintenance.   [name] and I agreed with plan.   Mable Paris, FNP

## 2021-05-24 NOTE — Assessment & Plan Note (Signed)
Chronic, stable.  Continue baclofen 10 mg 3 times daily as needed, over-the-counter lidocaine patches.Conitnue gabapentin '200mg'$  qam, '200mg'$  midday and '300mg'$  qpm.   Advised to STOP meloxicam 7.5 mg due to history of gastritis. In its place she will use tramadol 50 mg daily prn.  She has been on tramadol in the past and it worked well for her.  advised her to start with 25 mg that she is also currently on Lovenox, trazodone, and Wellbutrin.  Counseled on symptoms of serotonin syndrome. Close follow up.  I looked up patient on [place] Controlled Substances Reporting System PMP AWARE and saw no activity that raised concern of inappropriate use.

## 2021-05-24 NOTE — Patient Instructions (Signed)
Rash is nonspecific at this time.  As discussed, you may trial combining triamcinolone, Bactroban, clotrimazole.  Let me know if no resolution.     Please stop meloxicam due to gastritis.  Please start tramadol 25 mg as discussed and use this as needed once daily.   Nice to see you!

## 2021-05-24 NOTE — Assessment & Plan Note (Signed)
Gastritis seen on EGD earlier this year.  Advised to stop meloxicam and in its place she may use tramadol.

## 2021-05-25 IMAGING — CT CT ANGIO CHEST
2 of 6 series · 18 of 46 positions shown · IV contrast (omnipaque)
Comparison: None.

CLINICAL DATA: Left chest pain with elevated D-dimer

EXAM:
CT ANGIOGRAPHY CHEST
CT ABDOMEN AND PELVIS WITH CONTRAST
TECHNIQUE: Multidetector CT imaging of the chest was performed using the
standard protocol during bolus administration of intravenous
contrast. Multiplanar CT image reconstructions and MIPs were
obtained to evaluate the vascular anatomy. Multidetector CT imaging
of the abdomen and pelvis was performed using the standard protocol
during bolus administration of intravenous contrast.
CONTRAST:  100mL OMNIPAQUE IOHEXOL 350 MG/ML SOLN

[Series 3: thins · axial · 0.71mm/px · z∈[-515,-281]mm · 15 of 258 slices shown]
[im 12/258  lung]
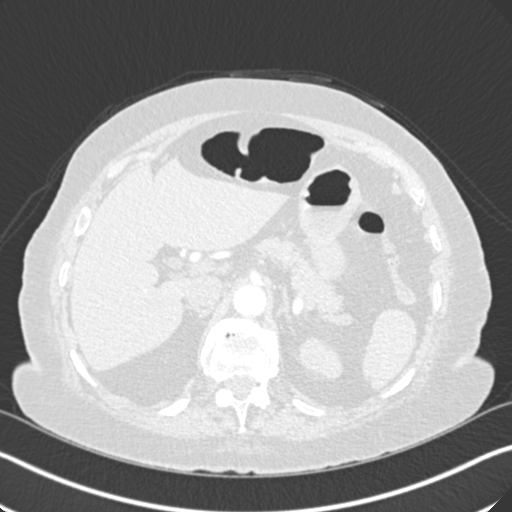
[im 34/258  soft-tissue]
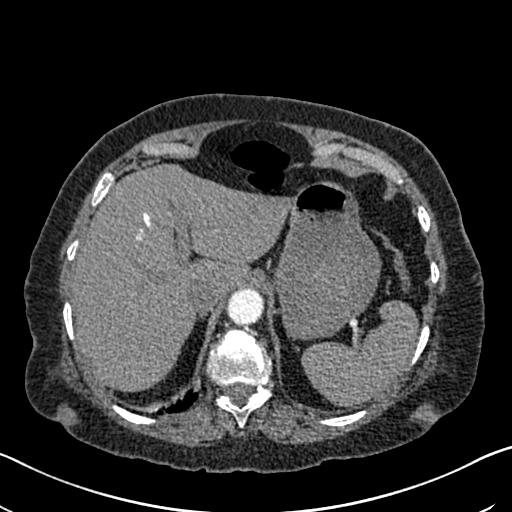
[im 45/258  lung]
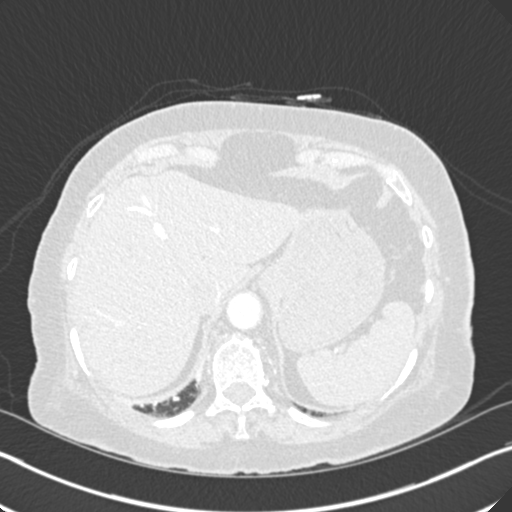
[im 68/258  soft-tissue]
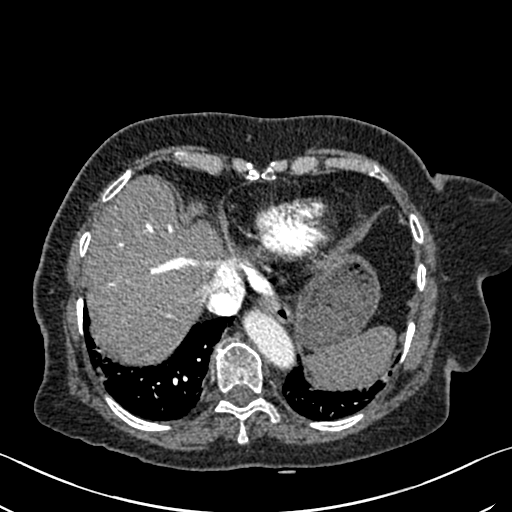
[im 79/258  lung]
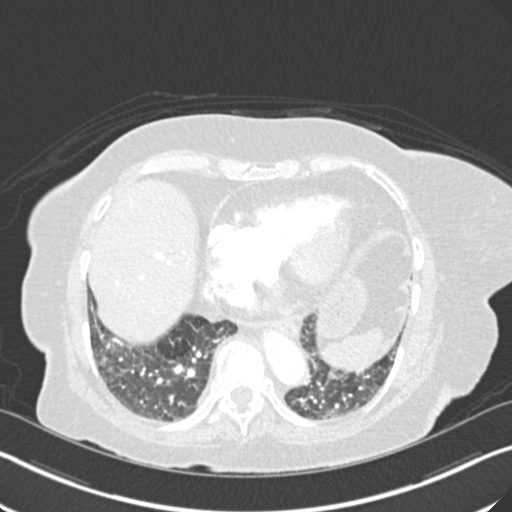
[im 101/258  soft-tissue]
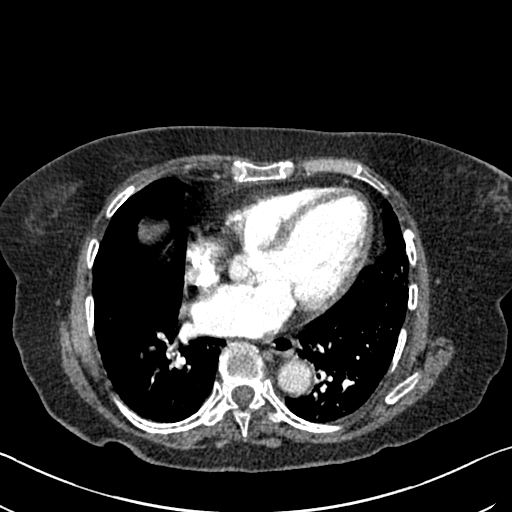
[im 112/258  lung]
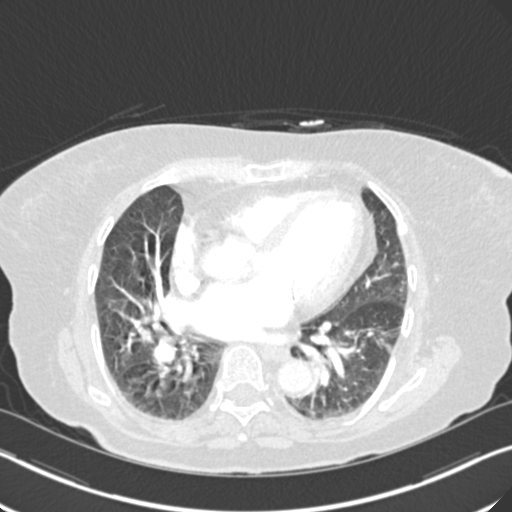
[im 135/258  soft-tissue]
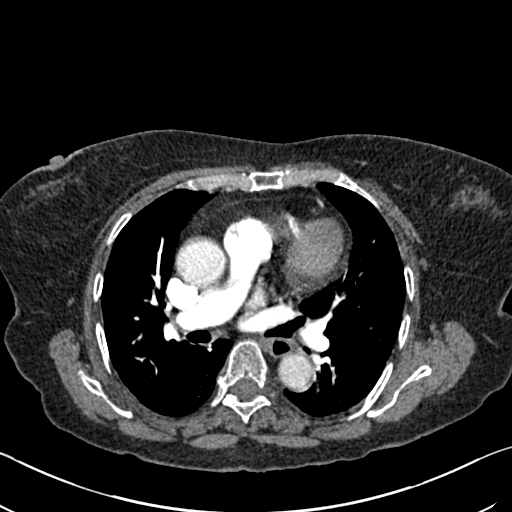
[im 146/258  lung]
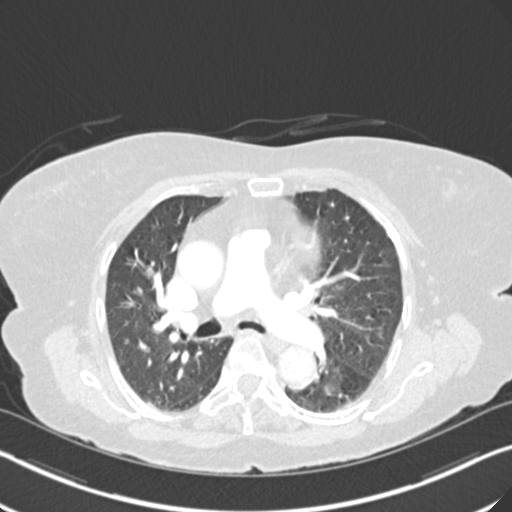
[im 157/258  soft-tissue]
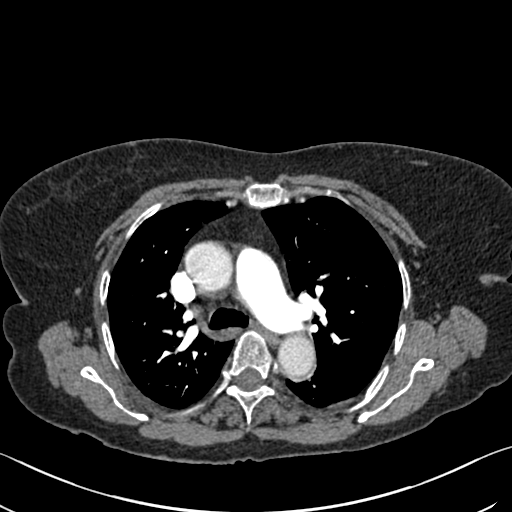
[im 179/258  lung]
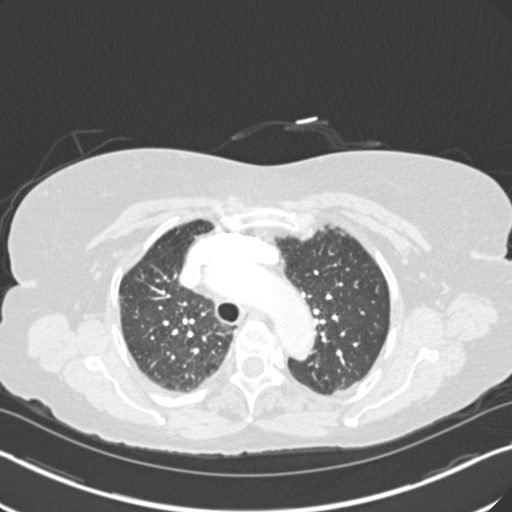
[im 190/258  soft-tissue]
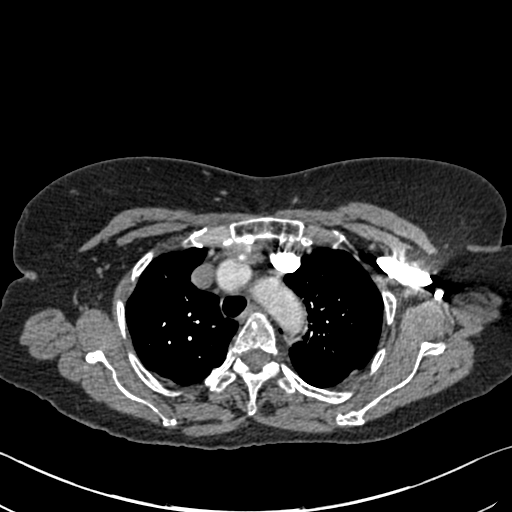
[im 213/258  lung]
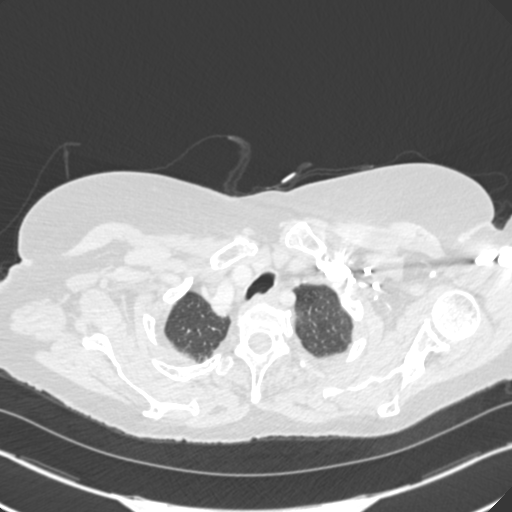
[im 224/258  soft-tissue]
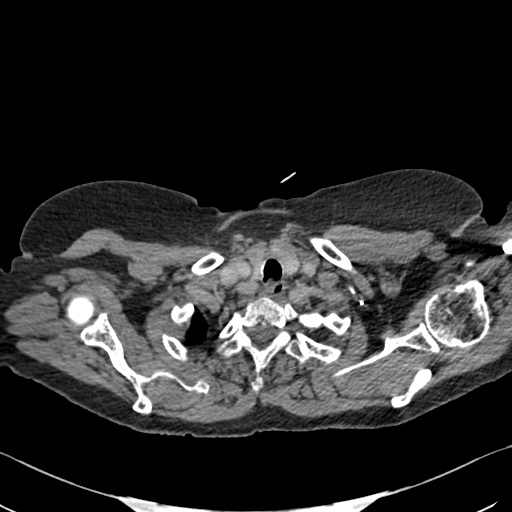
[im 246/258  lung]
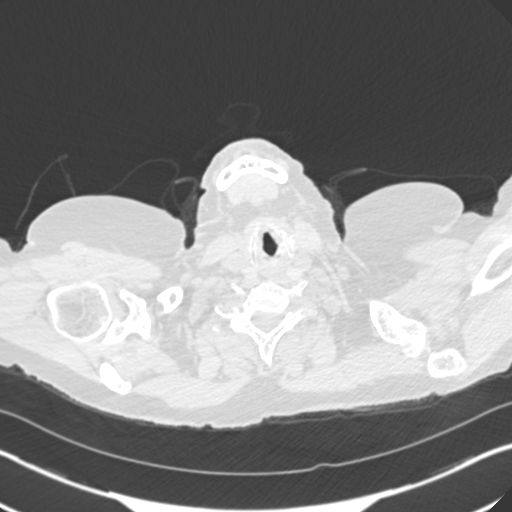

[Series 5: coronal mpr · coronal · 0.63mm/px · 3 of 106 slices shown]
[im 27/106  soft-tissue]
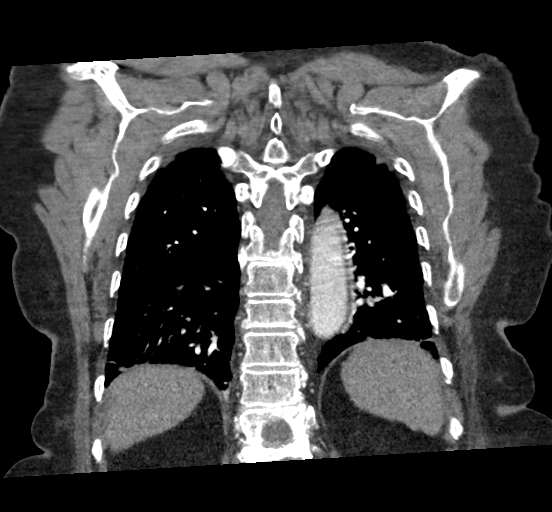
[im 53/106  soft-tissue]
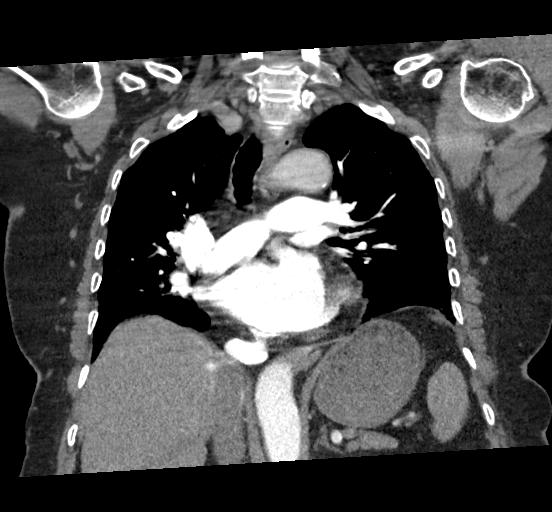
[im 79/106  soft-tissue]
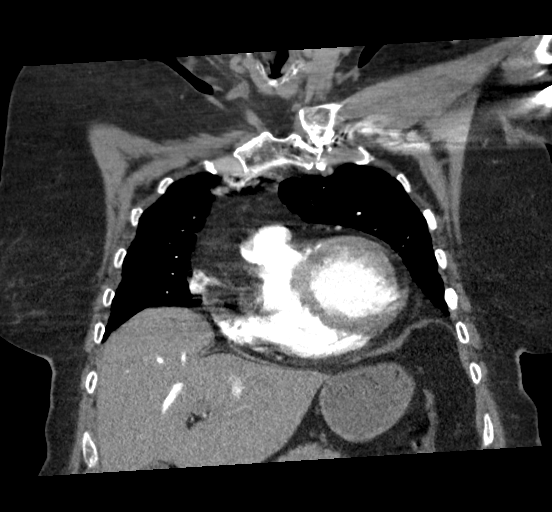

[18 of 46 positions shown; findings below may reference images not displayed]

FINDINGS: CTA CHEST FINDINGS

Cardiovascular: Contrast injection is sufficient to demonstrate
satisfactory opacification of the pulmonary arteries to the
segmental level. There is no pulmonary embolus. The main pulmonary
artery is within normal limits for size. There is no CT evidence of
acute right heart strain. The visualized aorta is normal. There is a
normal 3-vessel arch branching pattern. Heart size is normal,
without pericardial effusion.

Mediastinum/Nodes: No mediastinal, hilar or axillary
lymphadenopathy. The visualized thyroid and thoracic esophageal
course are unremarkable.

Lungs/Pleura: No pulmonary nodules or masses. No pleural effusion or
pneumothorax. No focal airspace consolidation. No focal pleural
abnormality.

Musculoskeletal: Multiple old left rib fractures.

Review of the MIP images confirms the above findings.

CT ABDOMEN and PELVIS FINDINGS

Hepatobiliary: Normal hepatic contours and density. No visible
biliary dilatation. Normal gallbladder.

Pancreas: Normal contours without ductal dilatation. No
peripancreatic fluid collection.

Spleen: Normal.

Adrenals/Urinary Tract:

--Adrenal glands: Normal.

--Right kidney/ureter: No hydronephrosis or perinephric stranding.
No nephrolithiasis. No obstructing ureteral stones.

--Left kidney/ureter: No hydronephrosis or perinephric stranding. No
nephrolithiasis. No obstructing ureteral stones.

--Urinary bladder: Unremarkable.

Stomach/Bowel:

--Stomach/Duodenum: No hiatal hernia or other gastric abnormality.
Normal duodenal course and caliber.

--Small bowel: No dilatation or inflammation.

--Colon: No focal abnormality.

--Appendix: Normal.

Vascular/Lymphatic: Normal course and caliber of the major abdominal
vessels. No abdominal or pelvic lymphadenopathy.

Reproductive: Status post hysterectomy. No adnexal mass.

Musculoskeletal. Right total hip arthroplasty. There are chronic L1
and L4 compression fractures.

Other: None.
IMPRESSION: 1. No pulmonary embolus or acute aortic syndrome.
2. No acute abnormality of the chest, abdomen or pelvis.
3. Chronic L1 and L4 compression fractures.

## 2021-05-26 NOTE — Assessment & Plan Note (Signed)
Chronic, stable.  Continue luvox 100 mg, trazodone 50 mg, and Wellbutrin 300 mg.

## 2021-05-26 NOTE — Assessment & Plan Note (Addendum)
Etiology nonspecific.  Location and symptoms are consistent with candidiasis.  We will also cover for coexisting bacterial skin infection.  Advised to use Kenalog, Bactroban and clotrimazole.  She will let me know if does not completely resolve.

## 2021-06-15 ENCOUNTER — Telehealth: Payer: Self-pay | Admitting: Family

## 2021-06-15 NOTE — Telephone Encounter (Signed)
-----  Message from Earlie Server, MD sent at 06/12/2021 10:54 PM EDT ----- Sorry for late reply.  Looks like she did not follow up to go over lab results.  [name], would you please arrange patient to to do a follow up visit with me? MD only  Thanks.   ----- Message ----- From: Burnard Hawthorne, FNP Sent: 05/24/2021   3:13 PM EDT To: Earlie Server, MD  Dr Tasia Catchings,   [facility] you are doing well.  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  Hematocrit and red blood cells are still ever so slightly low.  I do not see that patient ever come back to you for follow-up to review labs.    I just wanted to circle back to see if you felt like you should see her.  Any further labs that you advise that order from my end?  She is up-to-date on her colonoscopy and endoscopy 12/2020.  Endoscopy was significant for gastritis, colonoscopy significant for 1 polyp.  She was told by Dr. Hilarie Fredrickson however that she would not need to repeat either of these. She is not taking iron supplement.    Claudia Andersen

## 2021-06-15 NOTE — Telephone Encounter (Signed)
Call patient I consulted with Dr. Tasia Catchings, hematology as it relates to her recent blood work with anemia.    Dr. Tasia Catchings advised that she was due to follow-up with her.  Please advise patient to go ahead and call hematology, Dr. Collie Siad office and schedule for follow-up in the next 6 weeks preferable.  Let us know if any issues in doing so  Dr Tasia Catchings  [phone]

## 2021-06-15 NOTE — Telephone Encounter (Signed)
LMTCB

## 2021-06-16 NOTE — Telephone Encounter (Signed)
Pt called office back. Pt was notified of provider message and given phone number to call hematology and schedule a follow up.

## 2021-07-05 IMAGING — US US ABDOMEN LIMITED
1 series · 14 of 25 positions shown · non-contrast
Comparison: CT 05/09/2020

CLINICAL DATA: Right upper quadrant pain

EXAM:
ULTRASOUND ABDOMEN LIMITED RIGHT UPPER QUADRANT

[Series 1: us abdomen limited · 0.20mm/px · 14 of 42 slices shown]
[im 1/42]
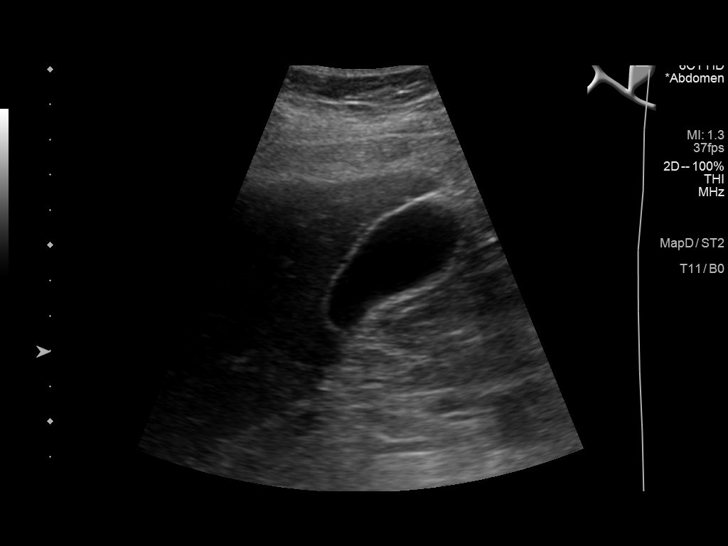
[im 4/42]
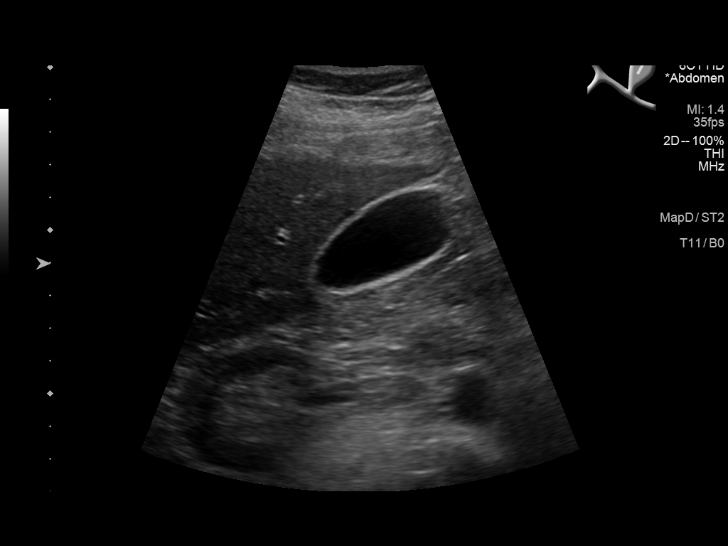
[im 7/42]
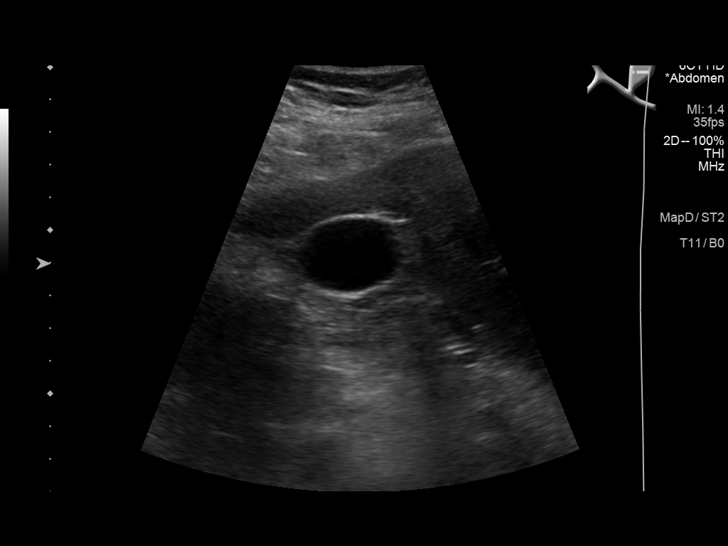
[im 11/42]
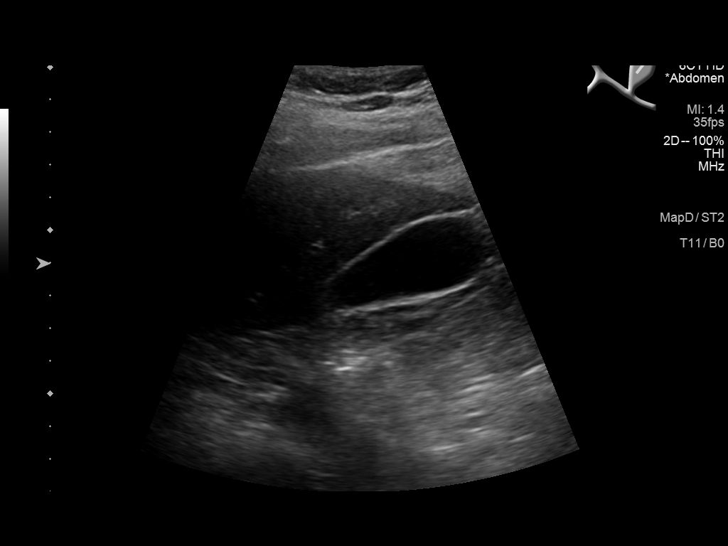
[im 14/42]
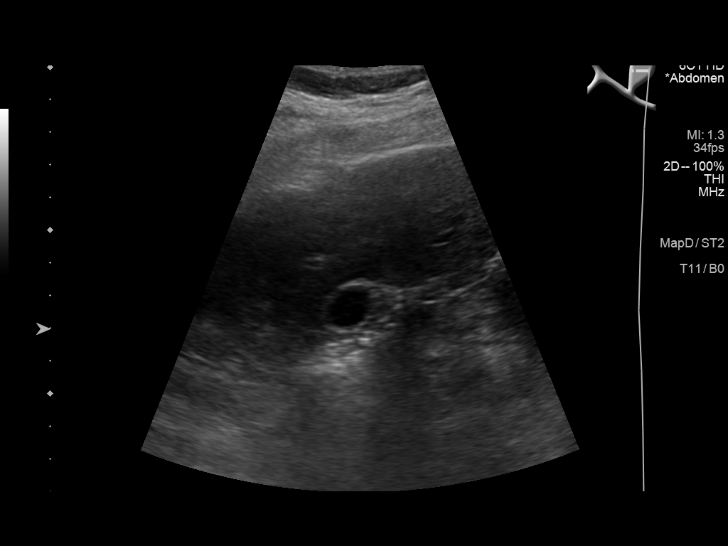
[im 16/42]
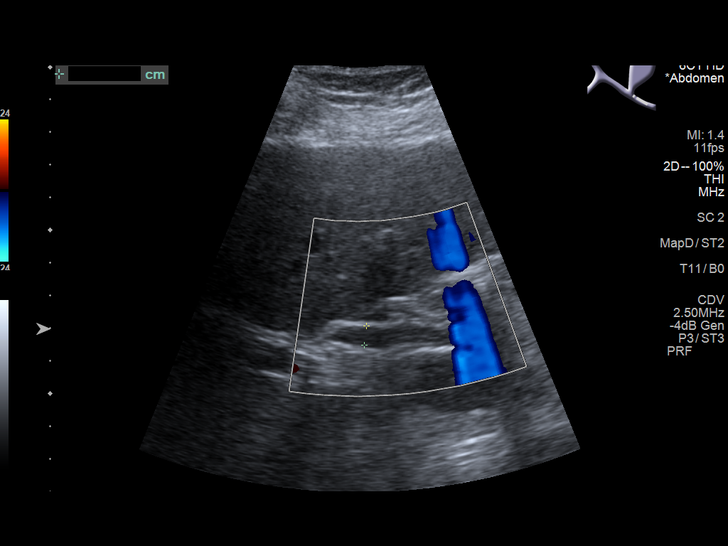
[im 19/42]
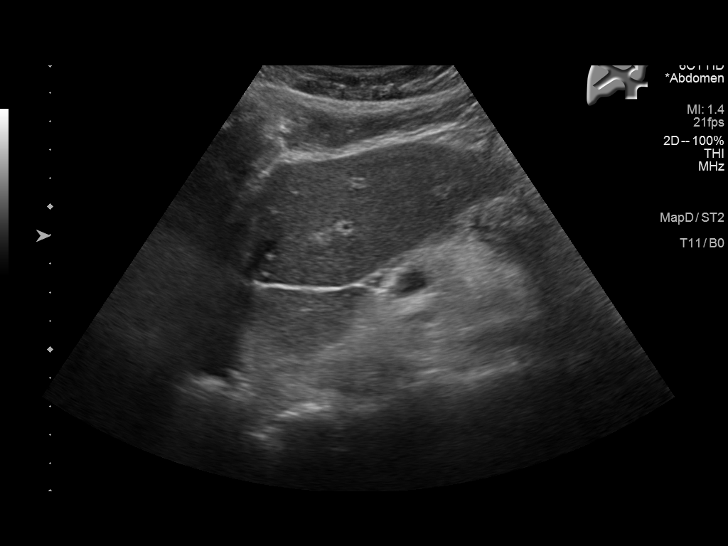
[im 23/42]
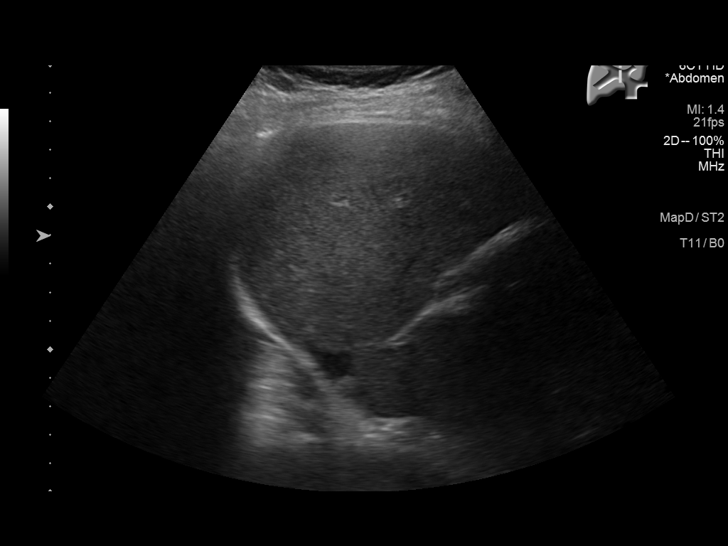
[im 26/42]
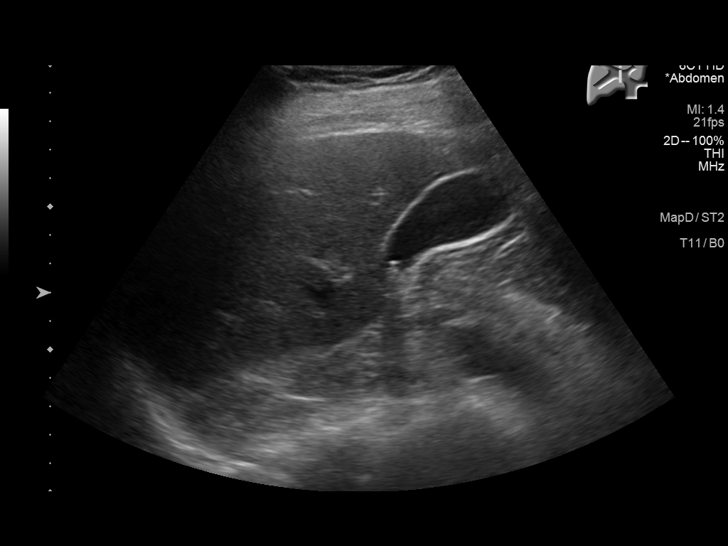
[im 28/42]
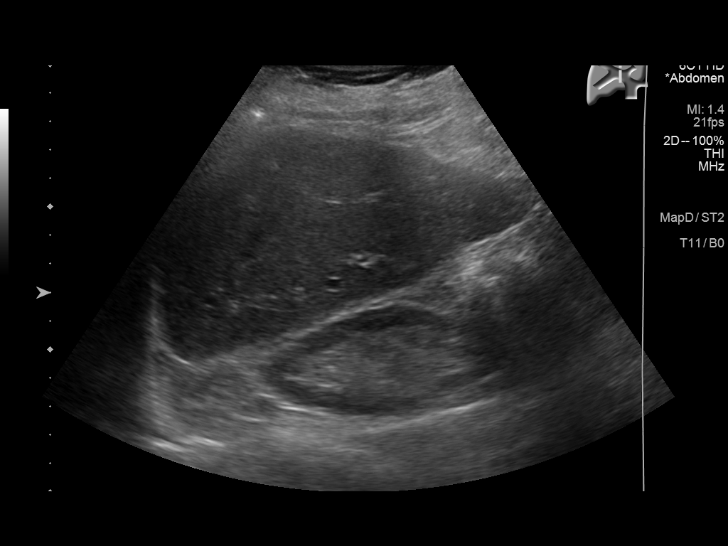
[im 31/42]
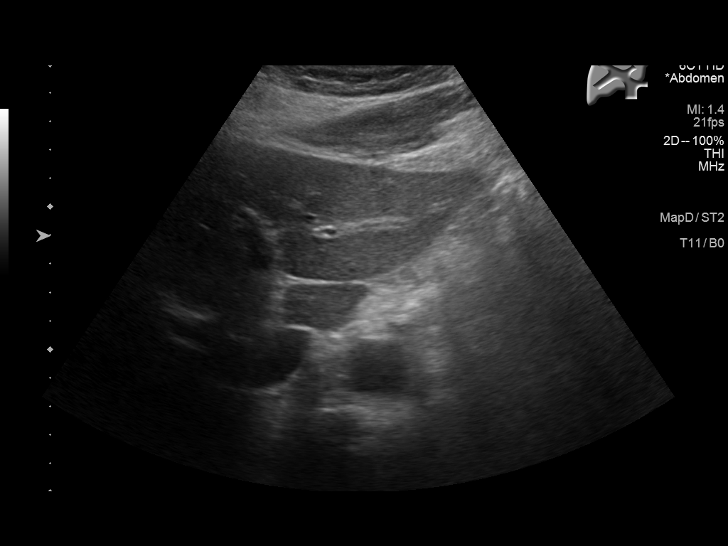
[im 35/42]
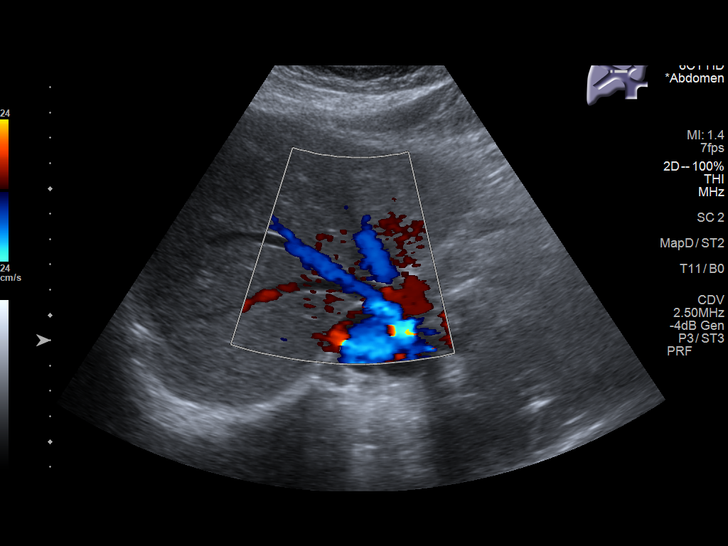
[im 38/42]
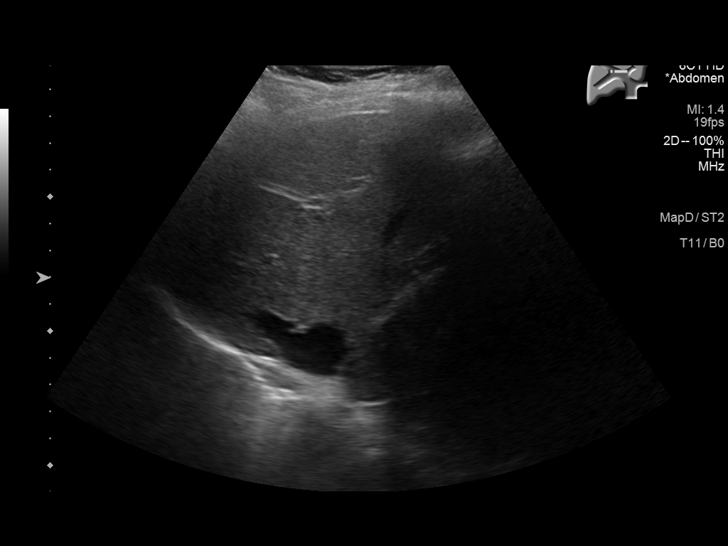
[im 42/42]
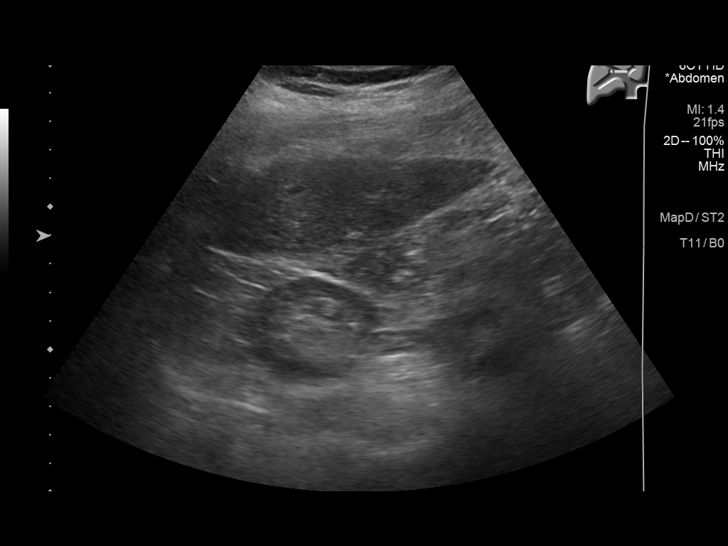

[14 of 25 positions shown; findings below may reference images not displayed]

FINDINGS: Gallbladder:

Punctate echogenic focus at the gallbladder neck potentially
representing tiny stone. Normal wall thickness. No pericholecystic
fluid. Sonographer reports positive sonographic Murphy.

Common bile duct:

Diameter: 5.9 mm

Liver:

No focal lesion identified. Within normal limits in parenchymal
echogenicity. Portal vein is patent on color Doppler imaging with
normal direction of blood flow towards the liver.

Other: Incidental note made of atrophic right kidney.
IMPRESSION: 1. Probable punctate stone in the gallbladder. A positive
sonographic Murphy is reported though there are no other suspicious
ultrasound features to suggest acute cholecystitis. Consider
correlation with nuclear medicine hepatobiliary imaging if acute
cholecystitis remains a concern
2. Atrophic right kidney

## 2021-07-06 ENCOUNTER — Inpatient Hospital Stay: Payer: Medicare PPO | Attending: Oncology | Admitting: Oncology

## 2021-07-06 ENCOUNTER — Encounter: Payer: Self-pay | Admitting: Oncology

## 2021-07-06 ENCOUNTER — Inpatient Hospital Stay: Payer: Medicare PPO

## 2021-07-06 VITALS — BP 123/72 | HR 72 | Temp 97.9°F | Resp 18 | Wt 143.0 lb

## 2021-07-06 DIAGNOSIS — Z8349 Family history of other endocrine, nutritional and metabolic diseases: Secondary | ICD-10-CM

## 2021-07-06 DIAGNOSIS — Z87891 Personal history of nicotine dependence: Secondary | ICD-10-CM | POA: Insufficient documentation

## 2021-07-06 DIAGNOSIS — D649 Anemia, unspecified: Secondary | ICD-10-CM

## 2021-07-06 LAB — CBC WITH DIFFERENTIAL/PLATELET
Abs Immature Granulocytes: 0.1 10*3/uL — ABNORMAL HIGH (ref 0.00–0.07)
Basophils Absolute: 0.1 10*3/uL (ref 0.0–0.1)
Basophils Relative: 1 %
Eosinophils Absolute: 0.3 10*3/uL (ref 0.0–0.5)
Eosinophils Relative: 3 %
HCT: 36.1 % (ref 36.0–46.0)
Hemoglobin: 12.2 g/dL (ref 12.0–15.0)
Immature Granulocytes: 1 %
Lymphocytes Relative: 17 %
Lymphs Abs: 1.6 10*3/uL (ref 0.7–4.0)
MCH: 32.7 pg (ref 26.0–34.0)
MCHC: 33.8 g/dL (ref 30.0–36.0)
MCV: 96.8 fL (ref 80.0–100.0)
Monocytes Absolute: 1.1 10*3/uL — ABNORMAL HIGH (ref 0.1–1.0)
Monocytes Relative: 12 %
Neutro Abs: 5.9 10*3/uL (ref 1.7–7.7)
Neutrophils Relative %: 66 %
Platelets: 282 10*3/uL (ref 150–400)
RBC: 3.73 MIL/uL — ABNORMAL LOW (ref 3.87–5.11)
RDW: 13.2 % (ref 11.5–15.5)
WBC: 9 10*3/uL (ref 4.0–10.5)
nRBC: 0 % (ref 0.0–0.2)

## 2021-07-06 LAB — RETIC PANEL
Immature Retic Fract: 11.3 % (ref 2.3–15.9)
RBC.: 3.64 MIL/uL — ABNORMAL LOW (ref 3.87–5.11)
Retic Count, Absolute: 76.8 10*3/uL (ref 19.0–186.0)
Retic Ct Pct: 2.1 % (ref 0.4–3.1)
Reticulocyte Hemoglobin: 37.1 pg (ref >27.9)

## 2021-07-06 LAB — IRON AND TIBC
Iron: 51 ug/dL (ref 28–170)
Saturation Ratios: 18 % (ref 10.4–31.8)
TIBC: 279 ug/dL (ref 250–450)
UIBC: 228 ug/dL

## 2021-07-06 LAB — FERRITIN: Ferritin: 48 ng/mL (ref 11–307)

## 2021-07-06 LAB — FOLATE: Folate: 10 ng/mL (ref >5.9)

## 2021-07-06 LAB — VITAMIN B12: Vitamin B-12: 256 pg/mL (ref 180–914)

## 2021-07-06 NOTE — Progress Notes (Signed)
Pt here for follow up. Pt here due to PCP recommendation. Missed follow up in February.

## 2021-07-06 NOTE — Progress Notes (Signed)
Hematology/Oncology follow up note [facility] [phone] Fax:([phone]) [phone]   Patient Care Team: Burnard Hawthorne, FNP as PCP - General (Family Medicine)  REFERRING PROVIDER: Burnard Hawthorne, FNP  CHIEF COMPLAINTS/REASON FOR VISIT:  normocytic anemia and hemochromatosis  HISTORY OF PRESENTING ILLNESS:   Claudia Andersen is a  80 y.o.  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  She also donates platelet  on monthly basis.  Family history of hemochromatosis in her mom who needs frequent phlebotomy. Previous work-up showed normal vitamin B12 and folate level, positive Cologuard for which she is going to have a colonoscopy work-up.  Iron panel is not consistent with iron deficiency.  Multiple myeloma panel showed negative M protein.  Slightly increase free light chain ratio, urine protein electrophoresis is unremarkable.  No monoclonal protein in urine. Patient denies any constitutional symptoms.  Denies any family history of cancer.  Denies any change of bowel habit, bloody or black stool   INTERVAL HISTORY Claudia Andersen is a 80 y.o. female who has above history reviewed by me today presents for follow up visit to discuss lab results. She lost follow up after her initial visit.   No new complaints.   Review of Systems  Constitutional:  Negative for appetite change, chills, fatigue and fever.  HENT:   Negative for hearing loss and voice change.   Eyes:  Negative for eye problems.  Respiratory:  Negative for chest tightness and cough.   Cardiovascular:  Negative for chest pain.  Gastrointestinal:  Negative for abdominal distention, abdominal pain and blood in stool.  Endocrine: Negative for hot flashes.  Genitourinary:  Negative for  difficulty urinating and frequency.   Musculoskeletal:  Negative for arthralgias.  Skin:  Negative for itching and rash.  Neurological:  Negative for extremity weakness.  Hematological:  Negative for adenopathy.  Psychiatric/Behavioral:  Negative for confusion.    MEDICAL HISTORY:  Past Medical History:  Diagnosis Date   Ankle fracture, bimalleolar, closed, right, initial encounter 10/26/2018   d/t fall 10/26/18   Arthritis    generalized   Back abrasion    CRUSHED VERTEBRAE   Cataract    bilateral sx    Closed right radial fracture 10/26/2018   s/p a fall   Compression of lumbar vertebra (HCC)    L1 and L4 (from fall November 05, 2018)   Degenerative disc disease, lumbar    Depression    hx of-on meds   Gallstones    GERD (gastroesophageal reflux disease)    on meds   Migraines    Osteoporosis    on Prolia inj   Pulmonary emboli (HCC)    history of emboli    SURGICAL HISTORY: Past Surgical History:  Procedure Laterality Date   ABDOMINAL HYSTERECTOMY     APPENDECTOMY     CATARACT EXTRACTION W/PHACO Right 03/27/2018   Procedure: CATARACT EXTRACTION PHACO AND INTRAOCULAR LENS PLACEMENT ([place]);  Surgeon: Birder Robson, MD;  Location: [facility];  Service: Ophthalmology;  Laterality: Right;  [place] 00:33 AP% 15.3 CDE 5.18 Fluid pack lot # [id number]   CATARACT EXTRACTION W/PHACO Left 06/26/2018   Procedure: CATARACT EXTRACTION PHACO AND INTRAOCULAR LENS PLACEMENT ([place]);  Surgeon: Birder Robson, MD;  Location: [facility];  Service: Ophthalmology;  Laterality: Left;  [place]  00:38 AP% 15.2 CDE 5.80 Fluid pack lot # [id number]   FRACTURE SURGERY Right    RIGHT RADIAL wrist   METATARSAL HEAD EXCISION Right 04/01/2021   Procedure: METATARSAL HEAD EXCISION- Tailors Exostectomy -Right;  Surgeon: Caroline More, DPM;  Location: [facility];  Service: Podiatry;  Laterality: Right;  CANNOT arrive before 9:30 Anesthesia- Mac + local   TONSILLECTOMY AND ADENOIDECTOMY     TOTAL HIP  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   Procedure: TOTAL HIP ARTHROPLASTY ANTERIOR APPROACH;  Surgeon: Lovell Sheehan, MD;  Location: [facility];  Service: Orthopedics;  Laterality: Right;   TOTAL KNEE ARTHROPLASTY Left    TRIPLE SUBTALAR FUSION     WISDOM TOOTH EXTRACTION      SOCIAL HISTORY: Social History   Socioeconomic History   Marital status: Divorced    Spouse name: Not on file   Number of children: 1   Years of education: Not on file   Highest education level: Not on file  Occupational History   Occupation: retired  Tobacco Use   Smoking status: Former    Packs/day: 0.50    Years: 22.00    Pack years: 11.00    Types: Cigarettes    Quit date: 1982    Years since quitting: 40.7   Smokeless tobacco: Never  Vaping Use   Vaping Use: Never used  Substance and Sexual Activity   Alcohol use: Yes    Alcohol/week: 10.0 standard drinks    Types: 10 Standard drinks or equivalent per week   Drug use: Never   Sexual activity: Not on file  Other Topics Concern   Not on file  Social History Narrative   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    Lives at [facility] Strain: Not on file  Food Insecurity: Not on file  Transportation Needs: Not on file  Physical Activity: Not on file  Stress: Not on file  Social Connections: Not on file  Intimate Partner Violence: Not on file    FAMILY HISTORY: Family History  Problem Relation Age of Onset   Arthritis Mother    Stroke Mother    Hemachromatosis Mother    Heart disease Father    Pneumonia Maternal Grandmother    Heart disease Maternal Grandfather    Heart disease Paternal Grandmother    Heart disease Paternal Grandfather    Breast cancer Neg Hx    Colon cancer Neg Hx    Colon polyps Neg Hx    Esophageal cancer Neg Hx    Rectal cancer Neg Hx    Stomach cancer Neg Hx     ALLERGIES:  has No Known Allergies.  MEDICATIONS:  Current Outpatient Medications  Medication Sig  Dispense Refill   baclofen (LIORESAL) 10 MG tablet Take 1 tablet (10 mg total) by mouth 3 (three) times daily as needed for muscle spasms. 90 each 3   Biotin 2500 MCG CAPS Take 2,500 mcg by mouth daily.     Clotrimazole 1 % OINT Apply 1 application topically 2 (two) times daily. 56.7 g 1   denosumab (PROLIA) 60 MG/ML SOSY injection Inject 60 mg into the skin every 6 (six) months.     diclofenac Sodium (VOLTAREN) 1 % GEL Apply 4 g topically 4 (four) times daily. (Patient taking differently: Apply 4 g topically 4 (four) times daily as needed.) 100 g 1   Fluticasone Furoate (FLONASE SENSIMIST NA) Place 1 spray into both nostrils  daily as needed (allergies).      fluvoxaMINE (LUVOX) 100 MG tablet Take 100 mg by mouth daily.     gabapentin (NEURONTIN) 100 MG capsule Take 2 capsules (200 mg total) by mouth 2 (two) times daily. Take 281m po in the morning and one 2025mpo at noon. (Patient taking differently: Take 200 mg by mouth 2 (two) times daily. Take 20030mo in the morning and one 200m48m at noon.  (03/22/21 pt takes 200 mg AM, 300 mg PM)) 360 capsule 1   gabapentin (NEURONTIN) 300 MG capsule TAKE 1 CAPSULE BY MOUTH NIGHTLY 90 capsule 1   hydrochlorothiazide (MICROZIDE) 12.5 MG capsule TAKE 1 CAPSULE BY MOUTH EVERY DAY AS NEEDED 30 capsule 1   mupirocin ointment (BACTROBAN) 2 % Apply 1 application topically 2 (two) times daily. 22 g 1   PREVIDENT 5000 PLUS 1.1 % CREA dental cream Take by mouth in the morning and at bedtime.     traZODone (DESYREL) 100 MG tablet Take 0.5 tablets (50 mg total) by mouth at bedtime. 90 tablet 2   triamcinolone (KENALOG) 0.025 % cream Apply 1 application topically 2 (two) times daily. 15 g 1   valACYclovir (VALTREX) 1000 MG tablet TAKE TWO TABLETS BY MOUTH EVERY 12 HOURSFOR 1 DAY. **START AS SOON AS POSSIBLE AFTER SYMPTOM ONSET** 6 tablet 1   buPROPion (WELLBUTRIN XL) 300 MG 24 hr tablet TAKE 1 TABLET BY MOUTH DAILY (Patient not taking: Reported on 07/06/2021) 90 tablet 1    No current facility-administered medications for this visit.     PHYSICAL EXAMINATION: ECOG PERFORMANCE STATUS: 0 - Asymptomatic Vitals:   07/06/21 1533  BP: 123/72  Pulse: 72  Resp: 18  Temp: 97.9 F (36.6 C)   Filed Weights   07/06/21 1533  Weight: 143 lb (64.9 kg)    Physical Exam Constitutional:      General: She is not in acute distress. HENT:     Head: Normocephalic and atraumatic.  Eyes:     General: No scleral icterus. Cardiovascular:     Rate and Rhythm: Normal rate and regular rhythm.     Heart sounds: Normal heart sounds.  Pulmonary:     Effort: Pulmonary effort is normal. No respiratory distress.     Breath sounds: No wheezing.  Abdominal:     General: Bowel sounds are normal. There is no distension.     Palpations: Abdomen is soft.  Musculoskeletal:        General: No deformity. Normal range of motion.     Cervical back: Normal range of motion and neck supple.  Skin:    General: Skin is warm and dry.     Findings: No erythema or rash.  Neurological:     Mental Status: She is alert and oriented to person, place, and time. Mental status is at baseline.     Cranial Nerves: No cranial nerve deficit.     Coordination: Coordination normal.  Psychiatric:        Mood and Affect: Mood normal.    LABORATORY DATA:  I have reviewed the data as listed Lab Results  Component Value Date   WBC 9.0 07/06/2021   HGB 12.2 07/06/2021   HCT 36.1 07/06/2021   MCV 96.8 07/06/2021   PLT 282 07/06/2021   Recent Labs    07/30/20 1034 11/17/20 1156 03/25/21 0753  NA  --  141 139  K  --  4.3 3.5  CL  --  103 102  CO2  --  27 27  GLUCOSE  --  83 87  BUN  --  21 15  CREATININE  --  0.97 0.86  CALCIUM  --  9.3 9.1  GFRNONAA  --  60*  --   PROT 6.8 7.0 6.8  ALBUMIN  --  4.2 4.3  AST  --  22 21  ALT  --  16 14  ALKPHOS  --  53 46  BILITOT  --  0.7 0.6    Iron/TIBC/Ferritin/ %Sat    Component Value Date/Time   IRON 51 07/06/2021 1559   TIBC 279  07/06/2021 1559   FERRITIN 48 07/06/2021 1559   IRONPCTSAT 18 07/06/2021 1559      RADIOGRAPHIC STUDIES: I have personally reviewed the radiological images as listed and agreed with the findings in the report. No results found.    ASSESSMENT & PLAN:  1. Family history of hemochromatosis   2. Normocytic anemia    #Normocytic anemia, Hemoglobin is normal, mild decreased RBC- no intervention needed. Iron panel is not consistent with iron deficiency.  Check folate and B12. - chronic alcohol use.  Previous anemia is due to blood donation.   # Hemochromatosis carrier.  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  Advised patient to have first-degree relative screening for hemochromatosis.  Avoid alcohol consumption.  Avoid uncooked seafood. Patient voices understanding.   Iron panel shows no signs of iron overload.  Recommend iron panel annually. Patient prefers to be followed by her PCP   Orders Placed This Encounter  Procedures   Iron and TIBC    Standing Status:   Future    Number of Occurrences:   1    Standing Expiration Date:   07/06/2022   Ferritin    Standing Status:   Future    Number of Occurrences:   1    Standing Expiration Date:   01/03/2022   CBC with Differential/Platelet    Standing Status:   Future    Number of Occurrences:   1    Standing Expiration Date:   07/06/2022   Technologist smear review    Standing Status:   Future    Number of Occurrences:   1    Standing Expiration Date:   07/06/2022   Retic Panel    Standing Status:   Future    Number of Occurrences:   1    Standing Expiration Date:   07/06/2022   Vitamin B12    Standing Status:   Future    Number of Occurrences:   1    Standing Expiration Date:   07/06/2022   Folate    Standing Status:   Future    Number of Occurrences:   1    Standing Expiration Date:   07/06/2022    All questions were answered. The patient knows to call the clinic with any problems questions or  concerns.  cc Burnard Hawthorne, FNP    Earlie Server, MD, PhD 07/06/2021\

## 2021-07-07 ENCOUNTER — Encounter: Payer: Self-pay | Admitting: Family

## 2021-07-07 ENCOUNTER — Other Ambulatory Visit: Payer: Self-pay

## 2021-07-07 ENCOUNTER — Telehealth: Payer: Self-pay

## 2021-07-07 ENCOUNTER — Ambulatory Visit: Payer: Medicare PPO | Admitting: Family

## 2021-07-07 VITALS — BP 124/74 | HR 87 | Temp 95.6°F | Wt 142.8 lb

## 2021-07-07 DIAGNOSIS — G8929 Other chronic pain: Secondary | ICD-10-CM

## 2021-07-07 DIAGNOSIS — R600 Localized edema: Secondary | ICD-10-CM | POA: Diagnosis not present

## 2021-07-07 DIAGNOSIS — M545 Low back pain, unspecified: Secondary | ICD-10-CM | POA: Diagnosis not present

## 2021-07-07 DIAGNOSIS — L989 Disorder of the skin and subcutaneous tissue, unspecified: Secondary | ICD-10-CM

## 2021-07-07 DIAGNOSIS — K219 Gastro-esophageal reflux disease without esophagitis: Secondary | ICD-10-CM | POA: Diagnosis not present

## 2021-07-07 LAB — TECHNOLOGIST SMEAR REVIEW: Plt Morphology: ADEQUATE

## 2021-07-07 MED ORDER — PANTOPRAZOLE SODIUM 40 MG PO TBEC: 40.0000 mg | DELAYED_RELEASE_TABLET | Freq: Every day | ORAL | 1 refills | Status: DC

## 2021-07-07 NOTE — Assessment & Plan Note (Signed)
Bilateral.  Advised low-sodium diet, compression stockings.  Advised that she may increase her dose of hydrochlorothiazide by taking an extra tablet of 12.5 mg every other day or approximately 3-4 times per week.  She will schedule BMP lab in 7 to 10 days.

## 2021-07-07 NOTE — Progress Notes (Signed)
Subjective:    Patient ID: Claudia Andersen, female    DOB: Jan 14, 1941, 80 y.o.   MRN: [id number]  CC: Claudia Andersen is a 80 y.o. female who presents today for follow up.   HPI: Complains of increased gas and this is embarrassed by this. Comes and goes.  Compliant with probiotic She is avoiding onions.   No belching, constipation, abdominal pain, weight loss.  Endores occasional bloating Stools are soft and formed.  She is taking Beano with some relief.  She is not particular bothered by this. Takes protonix  33m prn qhs  Complains of bump on side of nose x 2 months, getting smaller.  No itching. Has bled after she has messed with it No h/o SCC, BCC  She also complains of leg swelling x past couple of weeks, unchanged.  Elevated legs with relief.   Compliant with hctz 12.525m Chronic low back pain-  compliant with baclofen 10 mg 3 times daily as needed, over-the-counter lidocaine patches, gabapentin 20074mam, 200m65mdday and 300mg29m, tramadol 50 mg daily prn. She is no longer on mobic.  She was recently represcribed Celebrex and orthopedics.  She is overall quite pleased with low back pain and current medication regimen.       CT ab/p 04/2020 which showed    Yesterday she had consult with hematology regarding normocytic anemia, hemochromatosis carrier.  I do you advise to avoid alcohol, uncooked seafood.  Iron panel annually. HISTORY:  Past Medical History:  Diagnosis Date   Ankle fracture, bimalleolar, closed, right, initial encounter 10/26/2018   d/t fall 10/26/18   Arthritis    generalized   Back abrasion    CRUSHED VERTEBRAE   Cataract    bilateral sx    Closed right radial fracture 10/26/2018   s/p a fall   Compression of lumbar vertebra (HCC)    L1 and L4 (from fall November 05, 2018)   Degenerative disc disease, lumbar    Depression    hx of-on meds   Gallstones    GERD (gastroesophageal reflux disease)    on meds   Migraines    Osteoporosis    on  Prolia inj   Pulmonary emboli (HCC)    history of emboli   Past Surgical History:  Procedure Laterality Date   ABDOMINAL HYSTERECTOMY     APPENDECTOMY     CATARACT EXTRACTION W/PHACO Right 03/27/2018   Procedure: CATARACT EXTRACTION PHACO AND INTRAOCULAR LENS PLACEMENT (IOC);Eufaulaurgeon: PorfiBirder Robson  Location: [facility];  Service: Ophthalmology;  Laterality: Right;  [place] 00[place]3 AP% 15.3 CDE 5.18 Fluid pack lot # [id number] EXTRACTION W/PHACO Left 06/26/2018   Procedure: CATARACT EXTRACTION PHACO AND INTRAOCULAR LENS PLACEMENT (IOC);Downingurgeon: PorfiBirder Robson  Location: [facility];  Service: Ophthalmology;  Laterality: Left;  [place] 00[place] AP% 15.2 CDE 5.80 Fluid pack lot # [id number] SURGERY Right    RIGHT RADIAL wrist   METATARSAL HEAD EXCISION Right 04/01/2021   Procedure: METATARSAL HEAD EXCISION- Tailors Exostectomy -Right;  Surgeon: BakerCaroline More;  [facility] [place];  Laterality: Right;  CANNOT arrive before 9:30 Anesthesia- Mac + local   TONSILLECTOMY AND ADENOIDECTOMY     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   Procedure: TOTAL HIP ARTHROPLASTY ANTERIOR APPROACH;  Surgeon: BowerLovell Sheehan  Location: [facility];  Service: Orthopedics;  Laterality: Right;   TOTAL KNEE ARTHROPLASTY Left    TRIPLE SUBTALAR FUSION  WISDOM TOOTH EXTRACTION     Family History  Problem Relation Age of Onset   Arthritis Mother    Stroke Mother    Hemachromatosis Mother    Heart disease Father    Pneumonia Maternal Grandmother    Heart disease Maternal Grandfather    Heart disease Paternal Grandmother    Heart disease Paternal Grandfather    Breast cancer Neg Hx    Colon cancer Neg Hx    Colon polyps Neg Hx    Esophageal cancer Neg Hx    Rectal cancer Neg Hx    Stomach cancer Neg Hx     Allergies: Patient has no known allergies. Current Outpatient Medications on File Prior to Visit  Medication Sig Dispense Refill   baclofen  (LIORESAL) 10 MG tablet Take 1 tablet (10 mg total) by mouth 3 (three) times daily as needed for muscle spasms. 90 each 3   Biotin 2500 MCG CAPS Take 2,500 mcg by mouth daily.     buPROPion (WELLBUTRIN XL) 300 MG 24 hr tablet TAKE 1 TABLET BY MOUTH DAILY 90 tablet 1   Clotrimazole 1 % OINT Apply 1 application topically 2 (two) times daily. 56.7 g 1   denosumab (PROLIA) 60 MG/ML SOSY injection Inject 60 mg into the skin every 6 (six) months.     diclofenac Sodium (VOLTAREN) 1 % GEL Apply 4 g topically 4 (four) times daily. (Patient taking differently: Apply 4 g topically 4 (four) times daily as needed.) 100 g 1   Fluticasone Furoate (FLONASE SENSIMIST NA) Place 1 spray into both nostrils daily as needed (allergies).      fluvoxaMINE (LUVOX) 100 MG tablet Take 100 mg by mouth daily.     gabapentin (NEURONTIN) 100 MG capsule Take 2 capsules (200 mg total) by mouth 2 (two) times daily. Take 241m po in the morning and one 2071mpo at noon. (Patient taking differently: Take 200 mg by mouth 2 (two) times daily. Take 20064mo in the morning and one 200m35m at noon.  (03/22/21 pt takes 200 mg AM, 300 mg PM)) 360 capsule 1   gabapentin (NEURONTIN) 300 MG capsule TAKE 1 CAPSULE BY MOUTH NIGHTLY 90 capsule 1   hydrochlorothiazide (MICROZIDE) 12.5 MG capsule TAKE 1 CAPSULE BY MOUTH EVERY DAY AS NEEDED 30 capsule 1   mupirocin ointment (BACTROBAN) 2 % Apply 1 application topically 2 (two) times daily. 22 g 1   PREVIDENT 5000 PLUS 1.1 % CREA dental cream Take by mouth in the morning and at bedtime.     traZODone (DESYREL) 100 MG tablet Take 0.5 tablets (50 mg total) by mouth at bedtime. 90 tablet 2   triamcinolone (KENALOG) 0.025 % cream Apply 1 application topically 2 (two) times daily. 15 g 1   valACYclovir (VALTREX) 1000 MG tablet TAKE TWO TABLETS BY MOUTH EVERY 12 HOURSFOR 1 DAY. **START AS SOON AS POSSIBLE AFTER SYMPTOM ONSET** 6 tablet 1   No current facility-administered medications on file prior to visit.     Social History   Tobacco Use   Smoking status: Former    Packs/day: 0.50    Years: 22.00    Pack years: 11.00    Types: Cigarettes    Quit date: 1982    Years since quitting: 40.7   Smokeless tobacco: Never  Vaping Use   Vaping Use: Never used  Substance Use Topics   Alcohol use: Yes    Alcohol/week: 10.0 standard drinks    Types: 10 Standard drinks or equivalent per  week   Drug use: Never    Review of Systems  Constitutional:  Negative for chills and fever.  Respiratory:  Negative for cough.   Cardiovascular:  Positive for leg swelling. Negative for chest pain and palpitations.  Gastrointestinal:  Negative for abdominal distention, constipation, diarrhea, nausea and vomiting.  Musculoskeletal:  Positive for back pain (controlled).  Skin:  Negative for color change.     Objective:    BP 124/74 (BP Location: Left Arm, Patient Position: Sitting)   Pulse 87   Temp (!) 95.6 F (35.3 C)   Wt 142 lb 12.8 oz (64.8 kg)   SpO2 94%   BMI 28.84 kg/m  BP Readings from Last 3 Encounters:  07/07/21 124/74  07/06/21 123/72  05/24/21 (!) 102/58   Wt Readings from Last 3 Encounters:  07/07/21 142 lb 12.8 oz (64.8 kg)  07/06/21 143 lb (64.9 kg)  05/24/21 145 lb 12.8 oz (66.1 kg)    Physical Exam Vitals reviewed.  Constitutional:      Appearance: Normal appearance. She is well-developed.  HENT:     Head:      Comments: Small skin colored pedunculated papule noted left side of nose.  Eyes:     Conjunctiva/sclera: Conjunctivae normal.  Cardiovascular:     Rate and Rhythm: Normal rate and regular rhythm.     Pulses: Normal pulses.     Heart sounds: Normal heart sounds.     Comments: BLE +1 pitting edema. No palpable cords or masses. No erythema or increased warmth. No asymmetry in calf size when compared bilaterally LE hair growth symmetric and present. No discoloration or varicosities noted. LE warm and palpable pedal pulses.  Pulmonary:     Effort: Pulmonary  effort is normal.     Breath sounds: Normal breath sounds. No wheezing, rhonchi or rales.  Abdominal:     General: Bowel sounds are normal. There is no distension.     Palpations: Abdomen is soft. Abdomen is not rigid. There is no fluid wave or mass.     Tenderness: There is no abdominal tenderness. There is no guarding or rebound.  Musculoskeletal:     Right lower leg: 1+ Pitting Edema present.     Left lower leg: 1+ Pitting Edema present.  Skin:    General: Skin is warm and dry.  Neurological:     Mental Status: She is alert.  Psychiatric:        Speech: Speech normal.        Behavior: Behavior normal.        Thought Content: Thought content normal.       Assessment & Plan:   Problem List Items Addressed This Visit       Digestive   GERD (gastroesophageal reflux disease) - Primary    Discussed symptoms and excess gas may be symptom of healthy or unhealthy diet.  She may continue Beano.  Advised abdominal imaging to exclude any underlying pathology process.  Patient politely declines as the gas is not really bothersome to her .  It is embarrassing.  Advised it is reasonable to trial Protonix 20 mg 1 hour prior to breakfast for 2-3 weeks to see if gas is an atypical symptom of acid reflux.  She will try this we will discuss imaging at follow-up.      Relevant Medications   pantoprazole (PROTONIX) 40 MG tablet     Musculoskeletal and Integument   Skin lesion    Discussed area of left-sided nose being a sun  exposed area and also the bleeding which she has seen.  Strongly advised her to make a follow-up dermatology as she is established with [facility] to ensure this is not basal or squamous cell carcinoma.  She verbalized understanding and will call to schedule this appointment        Other   Hemochromatosis   Leg edema    Bilateral.  Advised low-sodium diet, compression stockings.  Advised that she may increase her dose of hydrochlorothiazide by taking an extra  tablet of 12.5 mg every other day or approximately 3-4 times per week.  She will schedule BMP lab in 7 to 10 days.      Relevant Orders   Basic metabolic panel   Low back pain    Chronic, stable.  Patient feels medication regimen is adequate and she is quite pleased with tramadol.  She is using tramadol sparingly.  Continue baclofen 10 mg 3 times daily as needed, over-the-counter lidocaine patches, gabapentin 234m qam, 2050mmidday and 30052mpm, tramadol 50 mg daily prn.  I again advised patient that she has a history of mild gastritis seen on EGD, and I would advise against taking nonsteroidals including Celebrex which was recently provided by orthopedic.  She understands this now and states she will stop taking.  I advised her to be sure she is not taking meloxicam, or Celebrex as she was previously prescribed.        I am having SanShanda Bumpsandy" start on pantoprazole. I am also having her maintain her Fluticasone Furoate (FLONASE SENSIMIST NA), Biotin, diclofenac Sodium, Prolia, PreviDent 5000 Plus, gabapentin, gabapentin, traZODone, fluvoxaMINE, baclofen, valACYclovir, hydrochlorothiazide, buPROPion, triamcinolone, mupirocin ointment, and Clotrimazole.   Meds ordered this encounter  Medications   pantoprazole (PROTONIX) 40 MG tablet    Sig: Take 1 tablet (40 mg total) by mouth daily. Take one hour prior to breakfast.    Dispense:  90 tablet    Refill:  1    Order Specific Question:   Supervising Provider    Answer:   TULCrecencio [id number]]     Return precautions given.   Risks, benefits, and alternatives of the medications and treatment plan prescribed today were discussed, and patient expressed understanding.   Education regarding symptom management and diagnosis given to patient on AVS.  Continue to follow with ArnBurnard HawthorneNP for routine health maintenance.   SanShanda Bumpsd I agreed with plan.   MarMable ParisNP

## 2021-07-07 NOTE — Telephone Encounter (Signed)
Left VM for pt and sent her Mychart message with results and recommendation.

## 2021-07-07 NOTE — Telephone Encounter (Signed)
-----   Message from Earlie Server, MD sent at 07/07/2021  8:44 AM EDT ----- B12 is low. Please advise her to take otc vitamin b12 1056mcg daily

## 2021-07-07 NOTE — Patient Instructions (Addendum)
Take extra dose of hctz 12.5mg  2-3 times extra per week.  Continue take your regular dose of hydrochlorothiazide 12.5 mg for your blood pressure   please ensure that you check potassium lab with [place] about a week to 10 days after you have increased hydrochlorothiazide to ensure your potassium is normal and that you do not require potassium supplementation  Compression stockings  Trial protonix 40mg  ONE hour prior to breakfast to see if this helps with gas.  Please ensure you do call dermatology and schedule follow-up for second opinion in regards to the lesion on your nose

## 2021-07-08 NOTE — Assessment & Plan Note (Signed)
Discussed area of left-sided nose being a sun exposed area and also the bleeding which she has seen.  Strongly advised her to make a follow-up dermatology as she is established with [facility] to ensure this is not basal or squamous cell carcinoma.  She verbalized understanding and will call to schedule this appointment

## 2021-07-08 NOTE — Assessment & Plan Note (Signed)
Discussed symptoms and excess gas may be symptom of healthy or unhealthy diet.  She may continue Beano.  Advised abdominal imaging to exclude any underlying pathology process.  Patient politely declines as the gas is not really bothersome to her .  It is embarrassing.  Advised it is reasonable to trial Protonix 20 mg 1 hour prior to breakfast for 2-3 weeks to see if gas is an atypical symptom of acid reflux.  She will try this we will discuss imaging at follow-up.

## 2021-07-08 NOTE — Assessment & Plan Note (Addendum)
Chronic, stable.  Patient feels medication regimen is adequate and she is quite pleased with tramadol.  She is using tramadol sparingly.  Continue baclofen 10 mg 3 times daily as needed, over-the-counter lidocaine patches, gabapentin 200mg  qam, 200mg  midday and 300mg  qpm, tramadol 50 mg daily prn.  I again advised patient that she has a history of mild gastritis seen on EGD, and I would advise against taking nonsteroidals including Celebrex which was recently provided by orthopedic.  She understands this now and states she will stop taking.  I advised her to be sure she is not taking meloxicam, or Celebrex as she was previously prescribed.

## 2021-07-09 ENCOUNTER — Encounter: Payer: Self-pay | Admitting: Family

## 2021-07-10 ENCOUNTER — Encounter: Payer: Self-pay | Admitting: Family

## 2021-07-14 ENCOUNTER — Other Ambulatory Visit: Payer: Self-pay | Admitting: Family

## 2021-07-19 ENCOUNTER — Other Ambulatory Visit (INDEPENDENT_AMBULATORY_CARE_PROVIDER_SITE_OTHER): Payer: Medicare PPO

## 2021-07-19 ENCOUNTER — Other Ambulatory Visit: Payer: Self-pay

## 2021-07-19 DIAGNOSIS — R600 Localized edema: Secondary | ICD-10-CM | POA: Diagnosis not present

## 2021-07-19 LAB — BASIC METABOLIC PANEL
BUN: 17 mg/dL (ref 6–23)
CO2: 27 mEq/L (ref 19–32)
Calcium: 9 mg/dL (ref 8.4–10.5)
Chloride: 104 mEq/L (ref 96–112)
Creatinine, Ser: 0.83 mg/dL (ref 0.40–1.20)
GFR: 67.18 mL/min (ref >60.00)
Glucose, Bld: 77 mg/dL (ref 70–99)
Potassium: 3.7 mEq/L (ref 3.5–5.1)
Sodium: 141 mEq/L (ref 135–145)

## 2021-07-20 ENCOUNTER — Encounter: Payer: Self-pay | Admitting: Family

## 2021-07-21 ENCOUNTER — Other Ambulatory Visit: Payer: Self-pay | Admitting: Family

## 2021-07-21 DIAGNOSIS — F325 Major depressive disorder, single episode, in full remission: Secondary | ICD-10-CM

## 2021-07-21 MED ORDER — FLUVOXAMINE MALEATE 50 MG PO TABS: 50.0000 mg | ORAL_TABLET | Freq: Every day | ORAL | 1 refills | Status: DC

## 2021-07-26 ENCOUNTER — Ambulatory Visit: Payer: Medicare PPO

## 2021-08-03 ENCOUNTER — Encounter: Payer: Self-pay | Admitting: Family

## 2021-08-04 NOTE — Telephone Encounter (Signed)
Placed call to pt to triage.Pt states she is not having any shortness of breath, increased redness or swelling. Pt was given number to vascular and will be calling to make an appt.

## 2021-08-04 NOTE — Telephone Encounter (Signed)
LMTCB

## 2021-08-05 ENCOUNTER — Other Ambulatory Visit: Payer: Self-pay | Admitting: Family

## 2021-08-17 DIAGNOSIS — S76011A Strain of muscle, fascia and tendon of right hip, initial encounter: Secondary | ICD-10-CM | POA: Insufficient documentation

## 2021-08-23 ENCOUNTER — Ambulatory Visit (INDEPENDENT_AMBULATORY_CARE_PROVIDER_SITE_OTHER): Payer: Medicare PPO | Admitting: Vascular Surgery

## 2021-08-23 ENCOUNTER — Other Ambulatory Visit: Payer: Self-pay

## 2021-08-23 ENCOUNTER — Encounter (INDEPENDENT_AMBULATORY_CARE_PROVIDER_SITE_OTHER): Payer: Self-pay | Admitting: Vascular Surgery

## 2021-08-23 VITALS — BP 128/72 | HR 70 | Ht 60.0 in | Wt 142.0 lb

## 2021-08-23 DIAGNOSIS — I890 Lymphedema, not elsewhere classified: Secondary | ICD-10-CM

## 2021-08-23 DIAGNOSIS — E785 Hyperlipidemia, unspecified: Secondary | ICD-10-CM

## 2021-08-23 DIAGNOSIS — K219 Gastro-esophageal reflux disease without esophagitis: Secondary | ICD-10-CM | POA: Diagnosis not present

## 2021-08-23 DIAGNOSIS — I10 Essential (primary) hypertension: Secondary | ICD-10-CM | POA: Diagnosis not present

## 2021-08-23 NOTE — Progress Notes (Signed)
MRN : [id number]  Claudia Andersen is a 80 y.o. (11-14-1940) female who presents with chief complaint of check leg swelling.  History of Present Illness:   The patient returns to the office for followup evaluation regarding leg swelling.  The swelling has persisted and the pain associated with swelling continues.  She notes that it is particularly worse in the ankle areas bilaterally especially in the late afternoon and early evening.  There have not been any interval development of a ulcerations or wounds.  Since the previous visit the patient has been wearing graduated compression stockings and has noted little if any improvement in the lymphedema. The patient has been using compression routinely morning until night.  The patient also states elevation during the day and exercise is being done too.  She has also been exercising walking on a routine basis.  Duplex ultrasound of the venous system performed here in the office on May 12, 2020 was negative for DVT both acute and chronic.  No strictures or obstructions were identified.  Essentially it was a normal study.  Current Meds  Medication Sig   baclofen (LIORESAL) 10 MG tablet Take 1 tablet (10 mg total) by mouth 3 (three) times daily as needed for muscle spasms.   Biotin 2500 MCG CAPS Take 2,500 mcg by mouth daily.   buPROPion (WELLBUTRIN XL) 300 MG 24 hr tablet TAKE 1 TABLET BY MOUTH DAILY   Clotrimazole 1 % OINT Apply 1 application topically 2 (two) times daily.   denosumab (PROLIA) 60 MG/ML SOSY injection Inject 60 mg into the skin every 6 (six) months.   diclofenac Sodium (VOLTAREN) 1 % GEL Apply 4 g topically 4 (four) times daily. (Patient taking differently: Apply 4 g topically 4 (four) times daily as needed.)   Fluticasone Furoate (FLONASE SENSIMIST NA) Place 1 spray into both nostrils daily as needed (allergies).    fluvoxaMINE (LUVOX) 100 MG tablet Take 100 mg by mouth daily.   fluvoxaMINE (LUVOX) 50 MG tablet Take 1 tablet  (50 mg total) by mouth daily.   gabapentin (NEURONTIN) 100 MG capsule Take 2 capsules (200 mg total) by mouth 2 (two) times daily. Take 284m po in the morning and one 2027mpo at noon. (Patient taking differently: Take 200 mg by mouth 2 (two) times daily. Take 20073mo in the morning and one 200m47m at noon.  (03/22/21 pt takes 200 mg AM, 300 mg PM))   gabapentin (NEURONTIN) 300 MG capsule TAKE 1 CAPSULE BY MOUTH NIGHTLY   hydrochlorothiazide (MICROZIDE) 12.5 MG capsule TAKE ONE CAPSULE DAILY AS NEEDED   mupirocin ointment (BACTROBAN) 2 % Apply 1 application topically 2 (two) times daily.   pantoprazole (PROTONIX) 40 MG tablet Take 1 tablet (40 mg total) by mouth daily. Take one hour prior to breakfast.   PREVIDENT 5000 PLUS 1.1 % CREA dental cream Take by mouth in the morning and at bedtime.   traMADol (ULTRAM) 50 MG tablet tramadol 50 mg tablet  TAKE ONE TABLET BY MOUTH EVERY 6 HOURS AS NEEDED   traZODone (DESYREL) 100 MG tablet Take 0.5 tablets (50 mg total) by mouth at bedtime.   triamcinolone (KENALOG) 0.025 % cream Apply 1 application topically 2 (two) times daily.   valACYclovir (VALTREX) 1000 MG tablet TAKE TWO TABLETS BY MOUTH EVERY 12 HOURSFOR 1 DAY. **START AS SOON AS POSSIBLE AFTER SYMPTOM ONSET**    Past Medical History:  Diagnosis Date   Ankle fracture, bimalleolar, closed, right, initial encounter 10/26/2018  d/t fall 10/26/18   Arthritis    generalized   Back abrasion    CRUSHED VERTEBRAE   Cataract    bilateral sx    Closed right radial fracture 10/26/2018   s/p a fall   Compression of lumbar vertebra (HCC)    L1 and L4 (from fall November 05, 2018)   Degenerative disc disease, lumbar    Depression    hx of-on meds   Gallstones    GERD (gastroesophageal reflux disease)    on meds   Migraines    Osteoporosis    on Prolia inj   Pulmonary emboli (HCC)    history of emboli    Past Surgical History:  Procedure Laterality Date   ABDOMINAL HYSTERECTOMY      APPENDECTOMY     CATARACT EXTRACTION W/PHACO Right 03/27/2018   Procedure: CATARACT EXTRACTION PHACO AND INTRAOCULAR LENS PLACEMENT ([place]);  Surgeon: Birder Robson, MD;  Location: [facility];  Service: Ophthalmology;  Laterality: Right;  [place] 00:33 AP% 15.3 CDE 5.18 Fluid pack lot # [id number]   CATARACT EXTRACTION W/PHACO Left 06/26/2018   Procedure: CATARACT EXTRACTION PHACO AND INTRAOCULAR LENS PLACEMENT ([place]);  Surgeon: Birder Robson, MD;  Location: [facility];  Service: Ophthalmology;  Laterality: Left;  [place] 00:38 AP% 15.2 CDE 5.80 Fluid pack lot # [id number]   FRACTURE SURGERY Right    RIGHT RADIAL wrist   METATARSAL HEAD EXCISION Right 04/01/2021   Procedure: METATARSAL HEAD EXCISION- Tailors Exostectomy -Right;  Surgeon: Caroline More, DPM;  Location: [place];  Service: Podiatry;  Laterality: Right;  CANNOT arrive before 9:30 Anesthesia- Mac + local   TONSILLECTOMY AND ADENOIDECTOMY     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   Procedure: TOTAL HIP ARTHROPLASTY ANTERIOR APPROACH;  Surgeon: Lovell Sheehan, MD;  Location: [facility];  Service: Orthopedics;  Laterality: Right;   TOTAL KNEE ARTHROPLASTY Left    TRIPLE SUBTALAR FUSION     WISDOM TOOTH EXTRACTION      Social History Social History   Tobacco Use   Smoking status: Former    Packs/day: 0.50    Years: 22.00    Pack years: 11.00    Types: Cigarettes    Quit date: 1982    Years since quitting: 40.8   Smokeless tobacco: Never  Vaping Use   Vaping Use: Never used  Substance Use Topics   Alcohol use: Yes    Alcohol/week: 10.0 standard drinks    Types: 10 Standard drinks or equivalent per week   Drug use: Never    Family History Family History  Problem Relation Age of Onset   Arthritis Mother    Stroke Mother    Hemachromatosis Mother    Heart disease Father    Pneumonia Maternal Grandmother    Heart disease Maternal Grandfather    Heart disease Paternal Grandmother    Heart disease Paternal  Grandfather    Breast cancer Neg Hx    Colon cancer Neg Hx    Colon polyps Neg Hx    Esophageal cancer Neg Hx    Rectal cancer Neg Hx    Stomach cancer Neg Hx     No Known Allergies   REVIEW OF SYSTEMS (Negative unless checked)  Constitutional: _0 Weight loss  _1 Fever  _2 Chills Cardiac: _3 Chest pain   _4 Chest pressure   _5 Palpitations   _6 Shortness of breath when laying flat   _7 Shortness of breath with exertion. Vascular:  _8 Pain in legs with walking   _9 Pain in legs at rest  _10 History of  DVT   _0 Phlebitis   _1 Swelling in legs   _2 Varicose veins   _3 Non-healing ulcers Pulmonary:   _4 Uses home oxygen   _5 Productive cough   _6 Hemoptysis   _7 Wheeze  _8 COPD   _9 Asthma Neurologic:  _10 Dizziness   _11 Seizures   _12 History of stroke   _13 History of TIA  _14 Aphasia   _15 Vissual changes   _16 Weakness or numbness in arm   _17 Weakness or numbness in leg Musculoskeletal:   _18 Joint swelling   _19 Joint pain   _20 Low back pain Hematologic:  _21 Easy bruising  _22 Easy bleeding   _23 Hypercoagulable state   _24 Anemic Gastrointestinal:  _25 Diarrhea   _26 Vomiting  _27 Gastroesophageal reflux/heartburn   _28 Difficulty swallowing. Genitourinary:  _29 Chronic kidney disease   _30 Difficult urination  _31 Frequent urination   _32 Blood in urine Skin:  _33 Rashes   _34 Ulcers  Psychological:  _35 History of anxiety   _36  History of major depression.  Physical Examination  Vitals:   08/23/21 0936  BP: 128/72  Pulse: 70  Weight: 142 lb (64.4 kg)  Height: 5' (1.524 m)   Body mass index is 27.73 kg/m. Gen: WD/WN, NAD Head: [place]/AT, No temporalis wasting.  Ear/Nose/Throat: Hearing grossly intact, nares w/o erythema or drainage, pinna without lesions Eyes: PER, EOMI, sclera nonicteric.  Neck: Supple, no gross masses.  No JVD.  Pulmonary:  Good air movement, no audible wheezing, no use of accessory muscles.  Cardiac: RRR, precordium not hyperdynamic. Vascular:  scattered varicosities present bilaterally.  Mild venous stasis changes to  the legs bilaterally.  3-4+ soft pitting edema particularly localized to the ankles Vessel Right Left  Radial Palpable Palpable  Gastrointestinal: soft, non-distended. No guarding/no peritoneal signs.  Musculoskeletal: M/S 5/5 throughout.  No deformity.  Neurologic: CN 2-12 intact. Pain and light touch intact in extremities.  Symmetrical.  Speech is fluent. Motor exam as listed above. Psychiatric: Judgment intact, Mood & affect appropriate for pt's clinical situation. Dermatologic: Venous rashes no ulcers noted.  No changes consistent with cellulitis. Lymph : No lichenification or skin changes of chronic lymphedema.  CBC Lab Results  Component Value Date   WBC 9.0 07/06/2021   HGB 12.2 07/06/2021   HCT 36.1 07/06/2021   MCV 96.8 07/06/2021   PLT 282 07/06/2021    BMET    Component Value Date/Time   NA 141 07/19/2021 1006   K 3.7 07/19/2021 1006   CL 104 07/19/2021 1006   CO2 27 07/19/2021 1006   GLUCOSE 77 07/19/2021 1006   BUN 17 07/19/2021 1006   CREATININE 0.83 07/19/2021 1006   CALCIUM 9.0 07/19/2021 1006   GFRNONAA 60 (L) 11/17/2020 1156   GFRAA >60 05/08/2020 1836   CrCl cannot be calculated (Patient's most recent lab result is older than the maximum 21 days allowed.).  COAG Lab Results  Component Value Date   INR 1.1 (H) 11/26/2018    Radiology No results found.   Assessment/Plan 1. Lymphedema Recommend:  No surgery or intervention at this point in time.    I have reviewed my previous discussion with the patient regarding swelling and why it causes symptoms.  Patient will continue wearing graduated compression stockings class 1 (20-30 mmHg) on a daily basis. The patient will  beginning wearing the stockings first thing in the morning and removing them in the evening. The patient is instructed specifically not to sleep in the stockings.    In addition, behavioral modification including several periods of elevation of the lower extremities during the day  will be continued.  This was reviewed with the patient  during the initial visit.  The patient will also continue routine exercise, especially walking on a daily basis as was discussed during the initial visit.    Despite conservative treatments including graduated compression therapy class 1 and behavioral modification including exercise and elevation the patient  has not obtained adequate control of the lymphedema.  The patient still has stage 3 lymphedema and therefore, I believe that a lymph pump should be added to improve the control of the patient's lymphedema.  Additionally, a lymph pump is warranted because it will reduce the risk of cellulitis and ulceration in the future.  Patient should follow-up in six months    2. Hypertension, unspecified type Continue antihypertensive medications as already ordered, these medications have been reviewed and there are no changes at this time.   3. Hyperlipidemia, unspecified hyperlipidemia type Continue statin as ordered and reviewed, no changes at this time   4. Gastroesophageal reflux disease, unspecified whether esophagitis present Continue PPI as already ordered, this medication has been reviewed and there are no changes at this time.  Avoidence of caffeine and alcohol  Moderate elevation of the head of the bed      Hortencia Pilar, MD  08/23/2021 9:52 AM

## 2021-08-25 ENCOUNTER — Encounter: Payer: Self-pay | Admitting: Family

## 2021-08-25 ENCOUNTER — Ambulatory Visit: Payer: Medicare PPO | Admitting: Family

## 2021-08-25 ENCOUNTER — Other Ambulatory Visit: Payer: Self-pay

## 2021-08-25 VITALS — BP 124/74 | HR 65 | Temp 98.4°F | Ht 60.0 in | Wt 141.2 lb

## 2021-08-25 DIAGNOSIS — E785 Hyperlipidemia, unspecified: Secondary | ICD-10-CM

## 2021-08-25 DIAGNOSIS — I10 Essential (primary) hypertension: Secondary | ICD-10-CM | POA: Diagnosis not present

## 2021-08-25 DIAGNOSIS — K219 Gastro-esophageal reflux disease without esophagitis: Secondary | ICD-10-CM

## 2021-08-25 MED ORDER — ATORVASTATIN CALCIUM 10 MG PO TABS: 10.0000 mg | ORAL_TABLET | Freq: Every day | ORAL | 3 refills | Status: DC

## 2021-08-25 NOTE — Assessment & Plan Note (Signed)
Resolution of excess gas, burping.  Discussed long-term risk of PPIs and the evidence to use for short periods of times for flareups and to try histamine blocker.  She is tried Pepcid the past without relief.  She is most comfortable with staying on Protonix 40 mg indefinitely.   I have advised her how to wean from this medication if she chooses to do so.will follow.

## 2021-08-25 NOTE — Progress Notes (Signed)
Subjective:    Patient ID: Claudia Andersen, female    DOB: [birth year], 80 y.o.   MRN: [id number]  CC: Claudia Andersen is a 80 y.o. female who presents today for follow up.   HPI: Feels well today No new complaints  Gerd- compliant with Protonix 40 mg with improvement of gas, belching.  She notes that she has tried Pepcid in the past not helpful.  She is compliant with Beano but remains helpful as well.  She has noticed if she avoids lactose, symptoms improved as well.  She is currently drinking Lactaid milk.  No vomiting, pain or trouble swallowing.  She is no longer using celebrex  Consult with Dr Ronalee Belts regarding edema 08/23/2021 and lymphapress ordered and she is awaiting to receive.  She is no longer using hydrochlorothiazide.  She is actively working on the weight loss and exercising regularly  HLD-she is compliant with Lipitor 10 mg.  No arthralgias.  HISTORY:  Past Medical History:  Diagnosis Date   Ankle fracture, bimalleolar, closed, right, initial encounter 10/26/2018   d/t fall 10/26/18   Arthritis    generalized   Back abrasion    CRUSHED VERTEBRAE   Cataract    bilateral sx    Closed right radial fracture 10/26/2018   s/p a fall   Compression of lumbar vertebra (HCC)    L1 and L4 (from fall November 05, 2018)   Degenerative disc disease, lumbar    Depression    hx of-on meds   Gallstones    GERD (gastroesophageal reflux disease)    on meds   Migraines    Osteoporosis    on Prolia inj   Pulmonary emboli (HCC)    history of emboli   Past Surgical History:  Procedure Laterality Date   ABDOMINAL HYSTERECTOMY     APPENDECTOMY     CATARACT EXTRACTION W/PHACO Right 03/27/2018   Procedure: CATARACT EXTRACTION PHACO AND INTRAOCULAR LENS PLACEMENT ([place]);  Surgeon: Birder Robson, MD;  Location: [facility];  Service: Ophthalmology;  Laterality: Right;  [place] 00:33 AP% 15.3 CDE 5.18 Fluid pack lot # [id number]   CATARACT EXTRACTION W/PHACO Left 06/26/2018   Procedure:  CATARACT EXTRACTION PHACO AND INTRAOCULAR LENS PLACEMENT ([place]);  Surgeon: Birder Robson, MD;  Location: [facility];  Service: Ophthalmology;  Laterality: Left;  [place] 00:38 AP% 15.2 CDE 5.80 Fluid pack lot # [id number]   FRACTURE SURGERY Right    RIGHT RADIAL wrist   METATARSAL HEAD EXCISION Right 04/01/2021   Procedure: METATARSAL HEAD EXCISION- Tailors Exostectomy -Right;  Surgeon: Caroline More, DPM;  Location: [place];  Service: Podiatry;  Laterality: Right;  CANNOT arrive before 9:30 Anesthesia- Mac + local   TONSILLECTOMY AND ADENOIDECTOMY     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   Procedure: TOTAL HIP ARTHROPLASTY ANTERIOR APPROACH;  Surgeon: Lovell Sheehan, MD;  Location: [facility];  Service: Orthopedics;  Laterality: Right;   TOTAL KNEE ARTHROPLASTY Left    TRIPLE SUBTALAR FUSION     WISDOM TOOTH EXTRACTION     Family History  Problem Relation Age of Onset   Arthritis Mother    Stroke Mother    Hemachromatosis Mother    Heart disease Father    Pneumonia Maternal Grandmother    Heart disease Maternal Grandfather    Heart disease Paternal Grandmother    Heart disease Paternal Grandfather    Breast cancer Neg Hx    Colon cancer Neg Hx    Colon polyps Neg Hx  Esophageal cancer Neg Hx    Rectal cancer Neg Hx    Stomach cancer Neg Hx     Allergies: Patient has no known allergies. Current Outpatient Medications on File Prior to Visit  Medication Sig Dispense Refill   baclofen (LIORESAL) 10 MG tablet Take 1 tablet (10 mg total) by mouth 3 (three) times daily as needed for muscle spasms. 90 each 3   Biotin 2500 MCG CAPS Take 2,500 mcg by mouth daily.     buPROPion (WELLBUTRIN XL) 300 MG 24 hr tablet TAKE 1 TABLET BY MOUTH DAILY 90 tablet 1   Clotrimazole 1 % OINT Apply 1 application topically 2 (two) times daily. 56.7 g 1   denosumab (PROLIA) 60 MG/ML SOSY injection Inject 60 mg into the skin every 6 (six) months.     diclofenac Sodium (VOLTAREN) 1 % GEL Apply 4 g  topically 4 (four) times daily. (Patient taking differently: Apply 4 g topically 4 (four) times daily as needed.) 100 g 1   Fluticasone Furoate (FLONASE SENSIMIST NA) Place 1 spray into both nostrils daily as needed (allergies).      fluvoxaMINE (LUVOX) 100 MG tablet Take 100 mg by mouth daily.     gabapentin (NEURONTIN) 100 MG capsule Take 2 capsules (200 mg total) by mouth 2 (two) times daily. Take 234m po in the morning and one 2072mpo at noon. (Patient taking differently: Take 200 mg by mouth 2 (two) times daily. Take 20022mo in the morning and one 200m54m at noon.  (03/22/21 pt takes 200 mg AM, 300 mg PM)) 360 capsule 1   gabapentin (NEURONTIN) 300 MG capsule TAKE 1 CAPSULE BY MOUTH NIGHTLY 90 capsule 1   mupirocin ointment (BACTROBAN) 2 % Apply 1 application topically 2 (two) times daily. 22 g 1   pantoprazole (PROTONIX) 40 MG tablet Take 1 tablet (40 mg total) by mouth daily. Take one hour prior to breakfast. 90 tablet 1   PREVIDENT 5000 PLUS 1.1 % CREA dental cream Take by mouth in the morning and at bedtime.     traMADol (ULTRAM) 50 MG tablet tramadol 50 mg tablet  TAKE ONE TABLET BY MOUTH EVERY 6 HOURS AS NEEDED     traZODone (DESYREL) 100 MG tablet Take 0.5 tablets (50 mg total) by mouth at bedtime. 90 tablet 2   triamcinolone (KENALOG) 0.025 % cream Apply 1 application topically 2 (two) times daily. 15 g 1   valACYclovir (VALTREX) 1000 MG tablet TAKE TWO TABLETS BY MOUTH EVERY 12 HOURSFOR 1 DAY. **START AS SOON AS POSSIBLE AFTER SYMPTOM ONSET** 6 tablet 1   No current facility-administered medications on file prior to visit.    Social History   Tobacco Use   Smoking status: Former    Packs/day: 0.50    Years: 22.00    Pack years: 11.00    Types: Cigarettes    Quit date: 1982    Years since quitting: 40.8   Smokeless tobacco: Never  Vaping Use   Vaping Use: Never used  Substance Use Topics   Alcohol use: Yes    Alcohol/week: 10.0 standard drinks    Types: 10 Standard  drinks or equivalent per week   Drug use: Never    Review of Systems  Constitutional:  Negative for chills and fever.  Respiratory:  Negative for cough.   Cardiovascular:  Negative for chest pain and palpitations.  Gastrointestinal:  Negative for abdominal pain, diarrhea, nausea and vomiting.     Objective:    BP  124/74 (BP Location: Left Arm, Patient Position: Sitting, Cuff Size: Normal)   Pulse 65   Temp 98.4 F (36.9 C) (Oral)   Ht 5' (1.524 m)   Wt 141 lb 3.2 oz (64 kg)   SpO2 98%   BMI 27.58 kg/m  BP Readings from Last 3 Encounters:  08/25/21 124/74  08/23/21 128/72  07/07/21 124/74   Wt Readings from Last 3 Encounters:  08/25/21 141 lb 3.2 oz (64 kg)  08/23/21 142 lb (64.4 kg)  07/07/21 142 lb 12.8 oz (64.8 kg)    Physical Exam Vitals reviewed.  Constitutional:      Appearance: She is well-developed.  Eyes:     Conjunctiva/sclera: Conjunctivae normal.  Cardiovascular:     Rate and Rhythm: Normal rate and regular rhythm.     Pulses: Normal pulses.     Heart sounds: Normal heart sounds.  Pulmonary:     Effort: Pulmonary effort is normal.     Breath sounds: Normal breath sounds. No wheezing, rhonchi or rales.  Skin:    General: Skin is warm and dry.  Neurological:     Mental Status: She is alert.  Psychiatric:        Speech: Speech normal.        Behavior: Behavior normal.        Thought Content: Thought content normal.       Assessment & Plan:   Problem List Items Addressed This Visit       Cardiovascular and Mediastinum   HTN (hypertension)    Blood pressure controlled without hydrochlorothiazide.  I discontinued for now.  Patient will continue to spot check blood pressure at home.      Relevant Medications   atorvastatin (LIPITOR) 10 MG tablet     Digestive   GERD (gastroesophageal reflux disease)    Resolution of excess gas, burping.  Discussed long-term risk of PPIs and the evidence to use for short periods of times for flareups and to  try histamine blocker.  She is tried Pepcid the past without relief.  She is most comfortable with staying on Protonix 40 mg indefinitely.   I have advised her how to wean from this medication if she chooses to do so.will follow.        Other   HLD (hyperlipidemia) - Primary    Chronic, stable.  Continue Lipitor 10 mg      Relevant Medications   atorvastatin (LIPITOR) 10 MG tablet     I have discontinued [name] "Sandy"'s hydrochlorothiazide. I am also having her start on atorvastatin. Additionally, I am having her maintain her Fluticasone Furoate (FLONASE SENSIMIST NA), Biotin, diclofenac Sodium, Prolia, PreviDent 5000 Plus, gabapentin, traZODone, fluvoxaMINE, baclofen, valACYclovir, buPROPion, triamcinolone, mupirocin ointment, Clotrimazole, pantoprazole, gabapentin, and traMADol.   Meds ordered this encounter  Medications   atorvastatin (LIPITOR) 10 MG tablet    Sig: Take 1 tablet (10 mg total) by mouth daily.    Dispense:  90 tablet    Refill:  3    Order Specific Question:   Supervising Provider    Answer:   Crecencio Mc [[id number]]     Return precautions given.   Risks, benefits, and alternatives of the medications and treatment plan prescribed today were discussed, and patient expressed understanding.   Education regarding symptom management and diagnosis given to patient on AVS.  Continue to follow with Burnard Hawthorne, FNP for routine health maintenance.   [name] and I agreed with plan.   Joycelyn Schmid  Vidal Schwalbe, FNP

## 2021-08-25 NOTE — Patient Instructions (Signed)
Nice seeing you today  As discussed regarding protonix-    Long term use beyond 3 months of proton pump inhibitors ( PPI's) include Nexium, Prilosec, Protonix, Dexilant, and Prevacid.  Some medical studies have identified an association between the long-term use of PPIs and the development of numerous adverse conditions including intestinal infections, pneumonia, stomach cancer, osteoporosis-related bone fractures, chronic kidney disease, deficiencies of certain vitamins and minerals, heart attacks, strokes, dementia, and early death. Those studies have flaws, are not considered definitive, and do not establish a cause-and-effect relationship between PPIs and the adverse conditions. High-quality studies have found that PPIs do not significantly increase the risk of any of these conditions except intestinal infections. Nevertheless, we cannot exclude the possibility that PPIs might confer a small increase in the risk of developing these adverse conditions. For the treatment of GERD, gastroenterologists generally agree that the well-established benefits of PPIs far outweigh their theoretical risks.  Nonetheless I recommend trial wean off of PPI's  if uncomplicated acid reflux and use of histamine 2 blocker ( Pepcid AC).   Patients with history of esophagitis  or Barrett's esophagus should remain on PPI.   I generally recommend trying to control acid reflux with lifestyle modifications including avoiding trigger foods, not eating 2 hours prior to bedtime. You may use histamine 2 blockers daily to twice daily ( this is Pepcid) and then when symptoms flare, start back on PPI for short course.   Of note, we will need to do an endoscopy ( upper GI) to evaluate your esophagus, stomach in the future if acid reflux persists are you develop red flag symptoms: trouble swallowing, hoarseness, chronic cough, unexplained weight loss.

## 2021-08-25 NOTE — Assessment & Plan Note (Signed)
Chronic, stable.  Continue Lipitor 10 mg ?

## 2021-08-25 NOTE — Assessment & Plan Note (Signed)
Blood pressure controlled without hydrochlorothiazide.  I discontinued for now.  Patient will continue to spot check blood pressure at home.

## 2021-09-02 ENCOUNTER — Other Ambulatory Visit: Payer: Self-pay | Admitting: Family

## 2021-09-02 DIAGNOSIS — E785 Hyperlipidemia, unspecified: Secondary | ICD-10-CM

## 2021-09-07 ENCOUNTER — Ambulatory Visit: Payer: Medicare PPO

## 2021-09-17 ENCOUNTER — Other Ambulatory Visit: Payer: Self-pay | Admitting: Family

## 2021-09-17 NOTE — Telephone Encounter (Signed)
RX Refill: tramadol Last Seen: 08-25-21 Last Ordered: NA Next Appt: 11-29-21

## 2021-09-29 ENCOUNTER — Encounter: Payer: Self-pay | Admitting: Family

## 2021-10-26 ENCOUNTER — Telehealth: Payer: Self-pay

## 2021-10-26 ENCOUNTER — Ambulatory Visit (INDEPENDENT_AMBULATORY_CARE_PROVIDER_SITE_OTHER): Payer: Medicare PPO

## 2021-10-26 VITALS — Ht 60.0 in | Wt 136.0 lb

## 2021-10-26 DIAGNOSIS — Z0000 Encounter for general adult medical examination without abnormal findings: Secondary | ICD-10-CM | POA: Diagnosis not present

## 2021-10-26 NOTE — Progress Notes (Signed)
Subjective:   Claudia Andersen is a 81 y.o. female who presents for Medicare Annual (Subsequent) preventive examination.  Review of Systems    No ROS.  Medicare Wellness Virtual Visit.  Visual/audio telehealth visit, UTA vital signs.   See social history for additional risk factors.   Cardiac Risk Factors include: advanced age (>41mn, >>59women)     Objective:    Today's Vitals   10/26/21 1154  Weight: 136 lb (61.7 kg)  Height: 5' (1.524 m)   Body mass index is 26.56 kg/m.  Advanced Directives 10/26/2021 07/06/2021 04/01/2021 11/17/2020 07/23/2020 05/08/2020 07/23/2019  Does Patient Have a Medical Advance Directive? _0  Yes Yes  Type of AParamed[place]of [place] will Living will;Healthcare Power of [place] Living will;Healthcare Power of [place]  Does patient want to make changes to medical advance directive? No - Patient declined - No - Patient declined - No - Patient declined - No - Patient declined  Copy of [place] Chart? No - copy requested - No - copy requested - - - -    Current Medications (verified) Outpatient Encounter Medications as of 10/26/2021  Medication Sig   atorvastatin (LIPITOR) 10 MG tablet TAKE 1 TABLET BY MOUTH DAILY   baclofen (LIORESAL) 10 MG tablet Take 1 tablet (10 mg total) by mouth 3 (three) times daily as needed for muscle spasms.   Biotin 2500 MCG CAPS Take 2,500 mcg by mouth daily.   buPROPion (WELLBUTRIN XL) 300 MG 24 hr tablet TAKE 1 TABLET BY MOUTH DAILY   Clotrimazole 1 % OINT Apply 1 application topically 2 (two) times daily.   denosumab (PROLIA) 60 MG/ML SOSY injection Inject 60 mg into the skin every 6 (six) months.   diclofenac Sodium (VOLTAREN) 1 % GEL Apply 4 g topically 4 (four) times daily. (Patient taking differently: Apply 4 g topically 4 (four) times daily as needed.)   Fluticasone Furoate (FLONASE SENSIMIST NA)  Place 1 spray into both nostrils daily as needed (allergies).    fluvoxaMINE (LUVOX) 100 MG tablet Take 100 mg by mouth daily.   gabapentin (NEURONTIN) 100 MG capsule Take 2 capsules (200 mg total) by mouth 2 (two) times daily. Take 2071mpo in the morning and one 20044mo at noon. (Patient taking differently: Take 200 mg by mouth 2 (two) times daily. Take 200m70m in the morning and one 200mg79mat noon.  (03/22/21 pt takes 200 mg AM, 300 mg PM))   gabapentin (NEURONTIN) 300 MG capsule TAKE 1 CAPSULE BY MOUTH NIGHTLY   mupirocin ointment (BACTROBAN) 2 % Apply 1 application topically 2 (two) times daily.   pantoprazole (PROTONIX) 40 MG tablet Take 1 tablet (40 mg total) by mouth daily. Take one hour prior to breakfast.   PREVIDENT 5000 PLUS 1.1 % CREA dental cream Take by mouth in the morning and at bedtime.   traMADol (ULTRAM) 50 MG tablet tramadol 50 mg tablet  TAKE ONE TABLET BY MOUTH EVERY 6 HOURS AS NEEDED   traZODone (DESYREL) 100 MG tablet Take 0.5 tablets (50 mg total) by mouth at bedtime.   triamcinolone (KENALOG) 0.025 % cream Apply 1 application topically 2 (two) times daily.   valACYclovir (VALTREX) 1000 MG tablet TAKE TWO TABLETS BY MOUTH EVERY 12 HOURSFOR 1 DAY. **START AS SOON AS POSSIBLE AFTER SYMPTOM ONSET**   No facility-administered encounter medications on file as of 10/26/2021.  Allergies (verified) Patient has no known allergies.   History: Past Medical History:  Diagnosis Date   Ankle fracture, bimalleolar, closed, right, initial encounter 10/26/2018   d/t fall 10/26/18   Arthritis    generalized   Back abrasion    CRUSHED VERTEBRAE   Cataract    bilateral sx    Closed right radial fracture 10/26/2018   s/p a fall   Compression of lumbar vertebra (HCC)    L1 and L4 (from fall November 05, 2018)   Degenerative disc disease, lumbar    Depression    hx of-on meds   Gallstones    GERD (gastroesophageal reflux disease)    on meds   Migraines    Osteoporosis     on Prolia inj   Pulmonary emboli (HCC)    history of emboli   Past Surgical History:  Procedure Laterality Date   ABDOMINAL HYSTERECTOMY     APPENDECTOMY     CATARACT EXTRACTION W/PHACO Right 03/27/2018   Procedure: CATARACT EXTRACTION PHACO AND INTRAOCULAR LENS PLACEMENT ([place]);  Surgeon: Birder Robson, MD;  Location: [facility];  Service: Ophthalmology;  Laterality: Right;  [place] 00:33 AP% 15.3 CDE 5.18 Fluid pack lot # [id number]   CATARACT EXTRACTION W/PHACO Left 06/26/2018   Procedure: CATARACT EXTRACTION PHACO AND INTRAOCULAR LENS PLACEMENT ([place]);  Surgeon: Birder Robson, MD;  Location: [facility];  Service: Ophthalmology;  Laterality: Left;  [place] 00:38 AP% 15.2 CDE 5.80 Fluid pack lot # [id number]   FRACTURE SURGERY Right    RIGHT RADIAL wrist   METATARSAL HEAD EXCISION Right 04/01/2021   Procedure: METATARSAL HEAD EXCISION- Tailors Exostectomy -Right;  Surgeon: Caroline More, DPM;  Location: [facility];  Service: Podiatry;  Laterality: Right;  CANNOT arrive before 9:30 Anesthesia- Mac + local   TONSILLECTOMY AND ADENOIDECTOMY     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   Procedure: TOTAL HIP ARTHROPLASTY ANTERIOR APPROACH;  Surgeon: Lovell Sheehan, MD;  Location: [facility];  Service: Orthopedics;  Laterality: Right;   TOTAL KNEE ARTHROPLASTY Left    TRIPLE SUBTALAR FUSION     WISDOM TOOTH EXTRACTION     Family History  Problem Relation Age of Onset   Arthritis Mother    Stroke Mother    Hemachromatosis Mother    Heart disease Father    Pneumonia Maternal Grandmother    Heart disease Maternal Grandfather    Heart disease Paternal Grandmother    Heart disease Paternal Grandfather    Breast cancer Neg Hx    Colon cancer Neg Hx    Colon polyps Neg Hx    Esophageal cancer Neg Hx    Rectal cancer Neg Hx    Stomach cancer Neg Hx    Social History   Socioeconomic History   Marital status: Divorced    Spouse name: Not on file   Number of children: 1   Years of  education: Not on file   Highest education level: Not on file  Occupational History   Occupation: retired  Tobacco Use   Smoking status: Former    Packs/day: 0.50    Years: 22.00    Pack years: 11.00    Types: Cigarettes    Quit date: 1982    Years since quitting: 41.0   Smokeless tobacco: Never  Vaping Use   Vaping Use: Never used  Substance and Sexual Activity   Alcohol use: Yes    Alcohol/week: 10.0 standard drinks    Types: 10 Standard drinks or equivalent per week  Drug use: Never   Sexual activity: Not on file  Other Topics Concern   Not on file  Social History Narrative   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    Lives at [place] Strain: Low Risk    Difficulty of Paying Living Expenses: Not hard at all  Food Insecurity: No Food Insecurity   Worried About Charity fundraiser in the Last Year: Never true   Arboriculturist in the Last Year: Never true  Transportation Needs: No Transportation Needs   Lack of Transportation (Medical): No   Lack of Transportation (Non-Medical): No  Physical Activity: Sufficiently Active   Days of Exercise per Week: 7 days   Minutes of Exercise per Session: 60 min  Stress: No Stress Concern Present   Feeling of Stress : Not at all  Social Connections: Not on file    Tobacco Counseling Counseling given: Not Answered   Clinical Intake:  Pre-visit preparation completed: Yes        Diabetes: No  How often do you need to have someone help you when you read instructions, pamphlets, or other written materials from your doctor or pharmacy?: 1 - Never    Interpreter Needed?: No      Activities of Daily Living In your present state of health, do you have any difficulty performing the following activities: 10/26/2021  Hearing? N  Vision? N  Difficulty concentrating or making decisions? N  Walking or climbing stairs? N  Dressing or bathing? N  Doing errands,  shopping? N  Preparing Food and eating ? N  Using the Toilet? N  In the past six months, have you accidently leaked urine? N  Do you have problems with loss of bowel control? N  Managing your Medications? N  Managing your Finances? N  Housekeeping or managing your Housekeeping? N  Some recent data might be hidden    Patient Care Team: Burnard Hawthorne, FNP as PCP - General (Family Medicine)  Indicate any recent Medical Services you may have received from other than [facility] providers in the past year (date may be approximate).     Assessment:   This is a routine wellness examination for Claudia Andersen.  Virtual Visit via Telephone Note  I connected with  Claudia Andersen on 10/26/21 at 10:30 AM EST by telephone and verified that I am speaking with the correct person using two identifiers.  Persons participating in the virtual visit: patient/Nurse Health Advisor   I discussed the limitations, risks, security and privacy concerns of performing an evaluation and management service by telephone and the availability of in person appointments. The patient expressed understanding and agreed to proceed.  Interactive audio and video telecommunications were attempted between this nurse and patient, however failed, due to patient having technical difficulties OR patient did not have access to video capability.  We continued and completed visit with audio only.  Some vital signs may be absent or patient reported.   Hearing/Vision screen Hearing Screening - Comments:: Patient is able to hear conversational tones without difficulty.  No issues reported.   Vision Screening - Comments:: Followed by [facility]  Wears corrective lenses  Cataract extraction, bilateral  They have regular follow up with the ophthalmologist  Dietary issues and exercise activities discussed: Current Exercise Habits: Home exercise routine, Type of exercise: walking, Intensity: Mild Healthy diet   Goals Addressed  This Visit's Progress    Follow up with Primary Care Provider       As needed       Depression Screen PHQ 2/9 Scores 10/26/2021 07/07/2021 03/22/2021 12/07/2020 07/23/2020 06/18/2020 06/03/2020  PHQ - 2 Score 0 2 0 1 0 1 0  PHQ- 9 Score - _0 0 7 -    Fall Risk Fall Risk  10/26/2021 07/07/2021 12/07/2020 07/23/2020 06/03/2020  Falls in the past year? 0 1 0 0 0  Number falls in past yr: 0 0 0 0 0  Injury with Fall? 0 0 0 - 0  Comment - - - - -  Follow up _1     FALL RISK PREVENTION PERTAINING TO THE HOME: Home free of loose throw rugs in walkways, pet beds, electrical cords, etc? Yes  Adequate lighting in your home to reduce risk of falls? Yes   ASSISTIVE DEVICES UTILIZED TO PREVENT FALLS: Use of a cane, walker or w/c? No  Grab bars in the bathroom? Yes   TIMED UP AND GO: Was the test performed? No .   Cognitive Function: MMSE - Mini Mental State Exam 07/19/2018  Orientation to time 5  Orientation to Place 5  Registration 3  Attention/ Calculation 5  Recall 3  Language- name 2 objects 2  Language- repeat 1  Language- follow 3 step command 3  Language- read & follow direction 1  Write a sentence 1  Copy design 1  Total score 30     6CIT Screen 07/23/2019  What Year? 0 points  What month? 0 points  What time? 0 points  Count back from 20 0 points  Months in reverse 0 points  Repeat phrase 0 points  Total Score 0    Immunizations Immunization History  Administered Date(s) Administered   Influenza, High Dose Seasonal PF 07/13/2015, 07/31/2017, 07/19/2018, 07/08/2021   Influenza, Seasonal, Injecte, Preservative Fre 08/15/2006, 07/21/2009, 07/15/2010, 07/18/2011, 06/30/2012   Influenza,inj,Quad PF,6+ Mos 08/08/2014, 07/20/2016   Influenza,inj,quad, With Preservative 07/18/2019   Moderna Sars-Covid-2 Vaccination 10/31/2019,  12/02/2019, 08/28/2020   Pfizer Covid-19 Vaccine Bivalent Booster 40yr & up 07/08/2021   Pneumococcal Conjugate-13 08/18/2014, 07/20/2015   Pneumococcal Polysaccharide-23 12/15/2008   Td 12/04/2007   Zoster Recombinat (Shingrix) 11/30/2018, 03/19/2019   Zoster, Live 06/25/2012   TDAP status: Due, Education has been provided regarding the importance of this vaccine. Advised may receive this vaccine at local pharmacy or Health Dept. Aware to provide a copy of the vaccination record if obtained from local pharmacy or Health Dept. Verbalized acceptance and understanding. Deferred.   Screening Tests Health Maintenance  Topic Date Due   TETANUS/TDAP  10/26/2022 (Originally 12/03/2017)   Hepatitis C Screening  10/26/2022 (Originally 05/27/1959)   Pneumonia Vaccine 80 Years old  Completed   INFLUENZA VACCINE  Completed   DEXA SCAN  Completed   COVID-19 Vaccine  Completed   Zoster Vaccines- Shingrix  Completed   HPV VACCINES  Aged Out   Health Maintenance There are no preventive care reminders to display for this patient.  Lung Cancer Screening: (Low Dose CT Chest recommended if Age 80-80years, 30 pack-year currently smoking OR have quit w/in 15years.) does not qualify.   Hepatitis C Screening: does not qualify  Vision Screening: Recommended annual ophthalmology exams for early detection of glaucoma and other disorders of the eye. Is the patient up to date with their annual eye exam?  Yes  Dental Screening: Recommended annual dental exams for proper oral hygiene.  Community Resource Referral / Chronic Care Management: CRR required this visit?  No   CCM required this visit?  No      Plan:   Keep all routine maintenance appointments.   Patient reports despite dietary and medication schedule changes before meals, there is little to no improvement with flatulence. Patient is interested in other available options, if any. Deferred to pcp for follow up.   I have personally reviewed  and noted the following in the patients chart:   Medical and social history Use of alcohol, tobacco or illicit drugs  Current medications and supplements including opioid prescriptions. Taking tramadol. Followed by Orthopaedic. Dr. Harlow Mares. Functional ability and status Nutritional status Physical activity Advanced directives List of other physicians Hospitalizations, surgeries, and ER visits in previous 12 months Vitals Screenings to include cognitive, depression, and falls Referrals and appointments  In addition, I have reviewed and discussed with patient certain preventive protocols, quality metrics, and best practice recommendations. A written personalized care plan for preventive services as well as general preventive health recommendations were provided to patient.     Varney Biles, LPN   [id number]

## 2021-10-26 NOTE — Patient Instructions (Addendum)
Claudia Andersen , Thank you for taking time to come for your Medicare Wellness Visit. I appreciate your ongoing commitment to your health goals. Please review the following plan we discussed and let me know if I can assist you in the future.   These are the goals we discussed:  Goals      Follow up with Primary Care Provider     As needed        This is a list of the screening recommended for you and due dates:  Health Maintenance  Topic Date Due   Tetanus Vaccine  10/26/2022*   Hepatitis C Screening: USPSTF Recommendation to screen - Ages 18-79 yo.  10/26/2022*   Pneumonia Vaccine  Completed   Flu Shot  Completed   DEXA scan (bone density measurement)  Completed   COVID-19 Vaccine  Completed   Zoster (Shingles) Vaccine  Completed   HPV Vaccine  Aged Out  *Topic was postponed. The date shown is not the original due date.    Advanced directives: End of life planning; Advance aging; Advanced directives discussed.  Copy of current HCPOA/Living Will requested.    Conditions identified: Reports despite dietary and medication schedule changes before meals there is little to no improvement with flatulence. Patient is interested in other available options, if any. Deferred to pcp for follow up.    Follow up in one year for your annual wellness visit    Preventive Care 65 Years and Older, Female Preventive care refers to lifestyle choices and visits with your health care provider that can promote health and wellness. What does preventive care include? A yearly physical exam. This is also called an annual well check. Dental exams once or twice a year. Routine eye exams. Ask your health care provider how often you should have your eyes checked. Personal lifestyle choices, including: Daily care of your teeth and gums. Regular physical activity. Eating a healthy diet. Avoiding tobacco and drug use. Limiting alcohol use. Practicing safe sex. Taking low-dose aspirin every day. Taking  vitamin and mineral supplements as recommended by your health care provider. What happens during an annual well check? The services and screenings done by your health care provider during your annual well check will depend on your age, overall health, lifestyle risk factors, and family history of disease. Counseling  Your health care provider may ask you questions about your: Alcohol use. Tobacco use. Drug use. Emotional well-being. Home and relationship well-being. Sexual activity. Eating habits. History of falls. Memory and ability to understand (cognition). Work and work Statistician. Reproductive health. Screening  You may have the following tests or measurements: Height, weight, and BMI. Blood pressure. Lipid and cholesterol levels. These may be checked every 5 years, or more frequently if you are over 85 years old. Skin check. Lung cancer screening. You may have this screening every year starting at age 75 if you have a 30-pack-year history of smoking and currently smoke or have quit within the past 15 years. Fecal occult blood test (FOBT) of the stool. You may have this test every year starting at age 38. Flexible sigmoidoscopy or colonoscopy. You may have a sigmoidoscopy every 5 years or a colonoscopy every 10 years starting at age 37. Hepatitis C blood test. Hepatitis B blood test. Sexually transmitted disease (STD) testing. Diabetes screening. This is done by checking your blood sugar (glucose) after you have not eaten for a while (fasting). You may have this done every 1-3 years. Bone density scan. This is done to  screen for osteoporosis. You may have this done starting at age 28. Mammogram. This may be done every 1-2 years. Talk to your health care provider about how often you should have regular mammograms. Talk with your health care provider about your test results, treatment options, and if necessary, the need for more tests. Vaccines  Your health care provider may  recommend certain vaccines, such as: Influenza vaccine. This is recommended every year. Tetanus, diphtheria, and acellular pertussis (Tdap, Td) vaccine. You may need a Td booster every 10 years. Zoster vaccine. You may need this after age 30. Pneumococcal 13-valent conjugate (PCV13) vaccine. One dose is recommended after age 9. Pneumococcal polysaccharide (PPSV23) vaccine. One dose is recommended after age 29. Talk to your health care provider about which screenings and vaccines you need and how often you need them. This information is not intended to replace advice given to you by your health care provider. Make sure you discuss any questions you have with your health care provider. Document Released: 10/30/2015 Document Revised: 06/22/2016 Document Reviewed: 08/04/2015 Elsevier Interactive Patient Education  2017 [place] Prevention in the Home Falls can cause injuries. They can happen to people of all ages. There are many things you can do to make your home safe and to help prevent falls. What can I do on the outside of my home? Regularly fix the edges of walkways and driveways and fix any cracks. Remove anything that might make you trip as you walk through a door, such as a raised step or threshold. Trim any bushes or trees on the path to your home. Use bright outdoor lighting. Clear any walking paths of anything that might make someone trip, such as rocks or tools. Regularly check to see if handrails are loose or broken. Make sure that both sides of any steps have handrails. Any raised decks and porches should have guardrails on the edges. Have any leaves, snow, or ice cleared regularly. Use sand or salt on walking paths during winter. Clean up any spills in your garage right away. This includes oil or grease spills. What can I do in the bathroom? Use night lights. Install grab bars by the toilet and in the tub and shower. Do not use towel bars as grab bars. Use non-skid  mats or decals in the tub or shower. If you need to sit down in the shower, use a plastic, non-slip stool. Keep the floor dry. Clean up any water that spills on the floor as soon as it happens. Remove soap buildup in the tub or shower regularly. Attach bath mats securely with double-sided non-slip rug tape. Do not have throw rugs and other things on the floor that can make you trip. What can I do in the bedroom? Use night lights. Make sure that you have a light by your bed that is easy to reach. Do not use any sheets or blankets that are too big for your bed. They should not hang down onto the floor. Have a firm chair that has side arms. You can use this for support while you get dressed. Do not have throw rugs and other things on the floor that can make you trip. What can I do in the kitchen? Clean up any spills right away. Avoid walking on wet floors. Keep items that you use a lot in easy-to-reach places. If you need to reach something above you, use a strong step stool that has a grab bar. Keep electrical cords out of the way.  Do not use floor polish or wax that makes floors slippery. If you must use wax, use non-skid floor wax. Do not have throw rugs and other things on the floor that can make you trip. What can I do with my stairs? Do not leave any items on the stairs. Make sure that there are handrails on both sides of the stairs and use them. Fix handrails that are broken or loose. Make sure that handrails are as long as the stairways. Check any carpeting to make sure that it is firmly attached to the stairs. Fix any carpet that is loose or worn. Avoid having throw rugs at the top or bottom of the stairs. If you do have throw rugs, attach them to the floor with carpet tape. Make sure that you have a light switch at the top of the stairs and the bottom of the stairs. If you do not have them, ask someone to add them for you. What else can I do to help prevent falls? Wear shoes  that: Do not have high heels. Have rubber bottoms. Are comfortable and fit you well. Are closed at the toe. Do not wear sandals. If you use a stepladder: Make sure that it is fully opened. Do not climb a closed stepladder. Make sure that both sides of the stepladder are locked into place. Ask someone to hold it for you, if possible. Clearly mark and make sure that you can see: Any grab bars or handrails. First and last steps. Where the edge of each step is. Use tools that help you move around (mobility aids) if they are needed. These include: Canes. Walkers. Scooters. Crutches. Turn on the lights when you go into a dark area. Replace any light bulbs as soon as they burn out. Set up your furniture so you have a clear path. Avoid moving your furniture around. If any of your floors are uneven, fix them. If there are any pets around you, be aware of where they are. Review your medicines with your doctor. Some medicines can make you feel dizzy. This can increase your chance of falling. Ask your doctor what other things that you can do to help prevent falls. This information is not intended to replace advice given to you by your health care provider. Make sure you discuss any questions you have with your health care provider. Document Released: 07/30/2009 Document Revised: 03/10/2016 Document Reviewed: 11/07/2014 Opioid Pain Medicine Management Opioids are powerful medicines that are used to treat moderate to severe pain. When used for short periods of time, they can help you to: Sleep better. Do better in physical or occupational therapy. Feel better in the first few days after an injury. Recover from surgery. Opioids should be taken with the supervision of a trained health care provider. They should be taken for the shortest period of time possible. This is because opioids can be addictive, and the longer you take opioids, the greater your risk of addiction. This addiction can also be  called opioid use disorder. What are the risks? Using opioid pain medicines for longer than 3 days increases your risk of side effects. Side effects include: Constipation. Nausea and vomiting. Breathing difficulties (respiratory depression). Drowsiness. Confusion. Opioid use disorder. Itching. Taking opioid pain medicine for a long period of time can affect your ability to do daily tasks. It also puts you at risk for: Motor vehicle crashes. Depression. Suicide. Heart attack. Overdose, which can be life-threatening. What is a pain treatment plan? A pain treatment plan  is an agreement between you and your health care provider. Pain is unique to each person, and treatments vary depending on your condition. To manage your pain, you and your health care provider need to work together. To help you do this: Discuss the goals of your treatment, including how much pain you might expect to have and how you will manage the pain. Review the risks and benefits of taking opioid medicines. Remember that a good treatment plan uses more than one approach and minimizes the chance of side effects. Be honest about the amount of medicines you take and about any drug or alcohol use. Get pain medicine prescriptions from only one health care provider. Pain can be managed with many types of alternative treatments. Ask your health care provider to refer you to one or more specialists who can help you manage pain through: Physical or occupational therapy. Counseling (cognitive behavioral therapy). Good nutrition. Biofeedback. Massage. Meditation. Non-opioid medicine. Following a gentle exercise program. How to use opioid pain medicine Taking medicine Take your pain medicine exactly as told by your health care provider. Take it only when you need it. If your pain gets less severe, you may take less than your prescribed dose if your health care provider approves. If you are not having pain, do nottake pain  medicine unless your health care provider tells you to take it. If your pain is severe, do nottry to treat it yourself by taking more pills than instructed on your prescription. Contact your health care provider for help. Write down the times when you take your pain medicine. It is easy to become confused while on pain medicine. Writing the time can help you avoid overdose. Take other over-the-counter or prescription medicines only as told by your health care provider. Keeping yourself and others safe  While you are taking opioid pain medicine: Do not drive, use machinery, or power tools. Do not sign legal documents. Do not drink alcohol. Do not take sleeping pills. Do not supervise children by yourself. Do not do activities that require climbing or being in high places. Do not go to a lake, river, ocean, spa, or swimming pool. Do not share your pain medicine with anyone. Keep pain medicine in a locked cabinet or in a secure area where pets and children cannot reach it. Stopping your use of opioids If you have been taking opioid medicine for more than a few weeks, you may need to slowly decrease (taper) how much you take until you stop completely. Tapering your use of opioids can decrease your risk of symptoms of withdrawal, such as: Pain and cramping in the abdomen. Nausea. Sweating. Sleepiness. Restlessness. Uncontrollable shaking (tremors). Cravings for the medicine. Do not attempt to taper your use of opioids on your own. Talk with your health care provider about how to do this. Your health care provider may prescribe a step-down schedule based on how much medicine you are taking and how long you have been taking it. Getting rid of leftover pills Do not save any leftover pills. Get rid of leftover pills safely by: Taking the medicine to a prescription take-back program. This is usually offered by the county or law enforcement. Bringing them to a pharmacy that has a drug disposal  container. Flushing them down the toilet. Check the label or package insert of your medicine to see whether this is safe to do. Throwing them out in the trash. Check the label or package insert of your medicine to see whether this is safe  to do. If it is safe to throw it out, remove the medicine from the original container, put it into a sealable bag or container, and mix it with used coffee grounds, food scraps, dirt, or cat litter before putting it in the trash. Follow these instructions at home: Activity Do exercises as told by your health care provider. Avoid activities that make your pain worse. Return to your normal activities as told by your health care provider. Ask your health care provider what activities are safe for you. General instructions You may need to take these actions to prevent or treat constipation: Drink enough fluid to keep your urine pale yellow. Take over-the-counter or prescription medicines. Eat foods that are high in fiber, such as beans, whole grains, and fresh fruits and vegetables. Limit foods that are high in fat and processed sugars, such as fried or sweet foods. Keep all follow-up visits. This is important. Where to find support If you have been taking opioids for a long time, you may benefit from receiving support for quitting from a local support group or counselor. Ask your health care provider for a referral to these resources in your area. Where to find more information Centers for Disease Control and Prevention (CDC): http://www.wolf.info/ [place] Food and Drug Administration (FDA): [place] Get help right away if: You may have taken too much of an opioid (overdosed). Common symptoms of an overdose: Your breathing is slower or more shallow than normal. You have a very slow heartbeat (pulse). You have slurred speech. You have nausea and vomiting. Your pupils become very small. You have other potential symptoms: You are very confused. You faint or feel like  you will faint. You have cold, clammy skin. You have blue lips or fingernails. You have thoughts of harming yourself or harming others. These symptoms may represent a serious problem that is an emergency. Do not wait to see if the symptoms will go away. Get medical help right away. Call your local emergency services (911 in the [place] Do not drive yourself to the hospital.  If you ever feel like you may hurt yourself or others, or have thoughts about taking your own life, get help right away. Go to your nearest emergency department or: Call your local emergency services (911 in the [place] Call the [facility] 321-592-4151 in the [place] Call a suicide crisis helpline, such as the [facility] at 539 085 4995 or 988 in the [place]. This is open 24 hours a day in the [place] Text the Crisis Text Line at 203-042-7689 (in the [place].). Summary Opioid medicines can help you manage moderate to severe pain for a short period of time. A pain treatment plan is an agreement between you and your health care provider. Discuss the goals of your treatment, including how much pain you might expect to have and how you will manage the pain. If you think that you or someone else may have taken too much of an opioid, get medical help right away. This information is not intended to replace advice given to you by your health care provider. Make sure you discuss any questions you have with your health care provider. Document Revised: 04/28/2021 Document Reviewed: 01/13/2021 Elsevier Patient Education  2022 [employer].  Chartered certified accountant Patient Education  2017 [employer].

## 2021-10-26 NOTE — Telephone Encounter (Signed)
Unable to reach patient for scheduled AWV. No answer. Left message to reschedule.  

## 2021-10-27 ENCOUNTER — Encounter: Payer: Self-pay | Admitting: Family

## 2021-11-01 DIAGNOSIS — S39012A Strain of muscle, fascia and tendon of lower back, initial encounter: Secondary | ICD-10-CM | POA: Insufficient documentation

## 2021-11-01 DIAGNOSIS — M47816 Spondylosis without myelopathy or radiculopathy, lumbar region: Secondary | ICD-10-CM | POA: Insufficient documentation

## 2021-11-15 ENCOUNTER — Other Ambulatory Visit: Payer: Self-pay | Admitting: Family

## 2021-11-17 ENCOUNTER — Other Ambulatory Visit: Payer: Self-pay | Admitting: Family

## 2021-11-17 DIAGNOSIS — K219 Gastro-esophageal reflux disease without esophagitis: Secondary | ICD-10-CM

## 2021-11-17 DIAGNOSIS — F325 Major depressive disorder, single episode, in full remission: Secondary | ICD-10-CM

## 2021-11-29 ENCOUNTER — Ambulatory Visit: Payer: Medicare PPO | Admitting: Family

## 2021-12-07 ENCOUNTER — Other Ambulatory Visit: Payer: Self-pay | Admitting: Family

## 2021-12-07 DIAGNOSIS — F325 Major depressive disorder, single episode, in full remission: Secondary | ICD-10-CM

## 2021-12-13 ENCOUNTER — Encounter: Payer: Self-pay | Admitting: Family

## 2021-12-14 NOTE — Telephone Encounter (Signed)
Claudia Andersen would rather you come in for appointment to discuss. Claudia Andersen  can we get you set up ?

## 2021-12-14 NOTE — Telephone Encounter (Signed)
Was able to leave VM for patient to call back to office so we can schedule an appointment.

## 2021-12-15 DIAGNOSIS — M545 Low back pain, unspecified: Secondary | ICD-10-CM | POA: Diagnosis not present

## 2021-12-15 DIAGNOSIS — M6281 Muscle weakness (generalized): Secondary | ICD-10-CM | POA: Diagnosis not present

## 2021-12-15 DIAGNOSIS — M5416 Radiculopathy, lumbar region: Secondary | ICD-10-CM | POA: Diagnosis not present

## 2021-12-16 DIAGNOSIS — M6281 Muscle weakness (generalized): Secondary | ICD-10-CM | POA: Diagnosis not present

## 2021-12-16 DIAGNOSIS — M545 Low back pain, unspecified: Secondary | ICD-10-CM | POA: Diagnosis not present

## 2021-12-16 DIAGNOSIS — M5416 Radiculopathy, lumbar region: Secondary | ICD-10-CM | POA: Diagnosis not present

## 2021-12-16 NOTE — Telephone Encounter (Signed)
Appointment scheduled for 10 am on Mon march 13th ?

## 2021-12-21 ENCOUNTER — Other Ambulatory Visit: Payer: Self-pay | Admitting: Family

## 2021-12-21 DIAGNOSIS — M545 Low back pain, unspecified: Secondary | ICD-10-CM | POA: Diagnosis not present

## 2021-12-21 DIAGNOSIS — M5137 Other intervertebral disc degeneration, lumbosacral region: Secondary | ICD-10-CM

## 2021-12-21 DIAGNOSIS — M6281 Muscle weakness (generalized): Secondary | ICD-10-CM | POA: Diagnosis not present

## 2021-12-21 DIAGNOSIS — M5416 Radiculopathy, lumbar region: Secondary | ICD-10-CM | POA: Diagnosis not present

## 2021-12-23 DIAGNOSIS — M5416 Radiculopathy, lumbar region: Secondary | ICD-10-CM | POA: Diagnosis not present

## 2021-12-23 DIAGNOSIS — M6281 Muscle weakness (generalized): Secondary | ICD-10-CM | POA: Diagnosis not present

## 2021-12-23 DIAGNOSIS — M545 Low back pain, unspecified: Secondary | ICD-10-CM | POA: Diagnosis not present

## 2021-12-27 ENCOUNTER — Other Ambulatory Visit: Payer: Self-pay

## 2021-12-27 ENCOUNTER — Ambulatory Visit: Payer: Medicare PPO | Admitting: Family

## 2021-12-27 ENCOUNTER — Encounter: Payer: Self-pay | Admitting: Family

## 2021-12-27 VITALS — BP 132/72 | HR 72 | Temp 98.7°F | Ht 60.0 in | Wt 142.2 lb

## 2021-12-27 DIAGNOSIS — Z9181 History of falling: Secondary | ICD-10-CM | POA: Insufficient documentation

## 2021-12-27 DIAGNOSIS — F325 Major depressive disorder, single episode, in full remission: Secondary | ICD-10-CM | POA: Diagnosis not present

## 2021-12-27 NOTE — Assessment & Plan Note (Signed)
Discussed following the patient today.She declines with x-ray of right side as tenderness has improved and no gross abnormalities on exam today.  No headache or vision changes or loss of consciousness.  She is not on anticoagulation.  We discussed today concerned with nature of fall and that physical therapy to work on gait and balance would be of utmost importance.  She is currently in physical therapy for her low back at [facility] and I have placed a new referral for physical therapy to also focus on balance, gait and fall prevention ?

## 2021-12-27 NOTE — Progress Notes (Signed)
Subjective:    Patient ID: Claudia Andersen, female    DOB: Mar 28, 1941, 81 y.o.   MRN: [id number]  CC: Claudia Andersen is a 81 y.o. female who presents today for follow up  HPI: Follow up  Energy has improved and she is back to prior. She is no longer concerned with fatigue.   She has started taking b12 and thinks helping. She is walking 2-3 miles daily which helps energy as well.   Sleeping well   She fell 2 weeks ago and landed on right side of hip, right arm extended and onto right brow. She hit flat concrete sidewalk. No loc. EMS came to seen and she did not seek medical attention.  The time she had a laceration to her right brow.  Laceration has healed.  She had some soreness to the right side of her chest which has improved. No vision changes, HA, confusion, cough, pain with deep inspiration   She was walking her dog at [facility]. She was carrying a hiking poll. She felt off balance prior to fall . No associated cp, son, dizziness, leg weakness, peripheral neuropathy She is not on anticoagulation  Eye exam UTD.    She is compliant with luvox 100 mg, trazodone 50 mg, and Wellbutrin 300 mg.  Feels regimen is working well for her.  She takes gabapentin 200 mg qam and noon; she takes gabapentin 300 mg nightly Taking b12 1000 mcg B12 256 , 5 months ago  No anemia 5 months ago  Mammogram is up-to-date  Consult hematology, Dr. Tasia Catchings 07/06/2021 for family history of hemochromatosis.  Diagnosis of hereditary hemochromatosis     HISTORY:  Past Medical History:  Diagnosis Date   Ankle fracture, bimalleolar, closed, right, initial encounter 10/26/2018   d/t fall 10/26/18   Arthritis    generalized   Back abrasion    CRUSHED VERTEBRAE   Cataract    bilateral sx    Closed right radial fracture 10/26/2018   s/p a fall   Compression of lumbar vertebra (HCC)    L1 and L4 (from fall November 05, 2018)   Degenerative disc disease, lumbar    Depression    hx of-on meds   Gallstones     GERD (gastroesophageal reflux disease)    on meds   Migraines    Osteoporosis    on Prolia inj   Pulmonary emboli (HCC)    history of emboli   Past Surgical History:  Procedure Laterality Date   ABDOMINAL HYSTERECTOMY     APPENDECTOMY     CATARACT EXTRACTION W/PHACO Right 03/27/2018   Procedure: CATARACT EXTRACTION PHACO AND INTRAOCULAR LENS PLACEMENT ([place]);  Surgeon: Birder Robson, MD;  Location: [facility];  Service: Ophthalmology;  Laterality: Right;  [place] 00:33 AP% 15.3 CDE 5.18 Fluid pack lot # [id number]   CATARACT EXTRACTION W/PHACO Left 06/26/2018   Procedure: CATARACT EXTRACTION PHACO AND INTRAOCULAR LENS PLACEMENT ([place]);  Surgeon: Birder Robson, MD;  Location: [facility];  Service: Ophthalmology;  Laterality: Left;  [place] 00:38 AP% 15.2 CDE 5.80 Fluid pack lot # [id number]   FRACTURE SURGERY Right    RIGHT RADIAL wrist   METATARSAL HEAD EXCISION Right 04/01/2021   Procedure: METATARSAL HEAD EXCISION- Tailors Exostectomy -Right;  Surgeon: Caroline More, DPM;  Location: [place];  Service: Podiatry;  Laterality: Right;  CANNOT arrive before 9:30 Anesthesia- Mac + local   TONSILLECTOMY AND ADENOIDECTOMY     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   Procedure: TOTAL HIP  ARTHROPLASTY ANTERIOR APPROACH;  Surgeon: Lovell Sheehan, MD;  Location: [facility];  Service: Orthopedics;  Laterality: Right;   TOTAL KNEE ARTHROPLASTY Left    TRIPLE SUBTALAR FUSION     WISDOM TOOTH EXTRACTION     Family History  Problem Relation Age of Onset   Arthritis Mother    Stroke Mother    Hemachromatosis Mother    Heart disease Father    Pneumonia Maternal Grandmother    Heart disease Maternal Grandfather    Heart disease Paternal Grandmother    Heart disease Paternal Grandfather    Breast cancer Neg Hx    Colon cancer Neg Hx    Colon polyps Neg Hx    Esophageal cancer Neg Hx    Rectal cancer Neg Hx    Stomach cancer Neg Hx     Allergies: Patient has no known  allergies. Current Outpatient Medications on File Prior to Visit  Medication Sig Dispense Refill   atorvastatin (LIPITOR) 10 MG tablet TAKE 1 TABLET BY MOUTH DAILY 90 tablet 3   baclofen (LIORESAL) 10 MG tablet Take 1 tablet (10 mg total) by mouth 3 (three) times daily as needed for muscle spasms. 90 each 3   Biotin 2500 MCG CAPS Take 2,500 mcg by mouth daily.     buPROPion (WELLBUTRIN XL) 300 MG 24 hr tablet TAKE 1 TABLET BY MOUTH DAILY 90 tablet 1   Clotrimazole 1 % OINT Apply 1 application topically 2 (two) times daily. 56.7 g 1   denosumab (PROLIA) 60 MG/ML SOSY injection Inject 60 mg into the skin every 6 (six) months.     diclofenac Sodium (VOLTAREN) 1 % GEL Apply 4 g topically 4 (four) times daily. (Patient taking differently: Apply 4 g topically 4 (four) times daily as needed.) 100 g 1   Fluticasone Furoate (FLONASE SENSIMIST NA) Place 1 spray into both nostrils daily as needed (allergies).      fluvoxaMINE (LUVOX) 100 MG tablet Take 100 mg by mouth daily.     gabapentin (NEURONTIN) 100 MG capsule TAKE 2 CAPUSLES BY MOUTH EVERY MORNING AND 2 AT NOON 360 capsule 1   gabapentin (NEURONTIN) 300 MG capsule TAKE 1 CAPSULE BY MOUTH NIGHTLY 90 capsule 1   mupirocin ointment (BACTROBAN) 2 % Apply 1 application topically 2 (two) times daily. 22 g 1   pantoprazole (PROTONIX) 40 MG tablet TAKE ONE TABLET (40 MG) BY MOUTH EVERY DAY 1 HOUR BEFORE BREAKFAST 90 tablet 1   PREVIDENT 5000 PLUS 1.1 % CREA dental cream Take by mouth in the morning and at bedtime.     traMADol (ULTRAM) 50 MG tablet tramadol 50 mg tablet  TAKE ONE TABLET BY MOUTH EVERY 6 HOURS AS NEEDED     traZODone (DESYREL) 100 MG tablet Take 0.5 tablets (50 mg total) by mouth at bedtime. 90 tablet 2   triamcinolone (KENALOG) 0.025 % cream Apply 1 application topically 2 (two) times daily. 15 g 1   valACYclovir (VALTREX) 1000 MG tablet TAKE TWO TABLETS BY MOUTH EVERY 12 HOURSFOR 1 DAY. **START AS SOON AS POSSIBLE AFTER SYMPTOM ONSET** 6  tablet 1   No current facility-administered medications on file prior to visit.    Social History   Tobacco Use   Smoking status: Former    Packs/day: 0.50    Years: 22.00    Pack years: 11.00    Types: Cigarettes    Quit date: 1982    Years since quitting: 41.2   Smokeless tobacco: Never  Vaping Use  Vaping Use: Never used  Substance Use Topics   Alcohol use: Yes    Alcohol/week: 10.0 standard drinks    Types: 10 Standard drinks or equivalent per week   Drug use: Never    Review of Systems  Constitutional:  Negative for chills, fatigue (resolved) and fever.  Respiratory:  Negative for cough.   Cardiovascular:  Negative for chest pain and palpitations.  Gastrointestinal:  Negative for nausea and vomiting.  Skin:  Negative for wound.  Neurological:  Negative for dizziness and headaches.     Objective:    BP 132/72 (BP Location: Left Arm, Patient Position: Sitting, Cuff Size: Normal)    Pulse 72    Temp 98.7 F (37.1 C) (Oral)    Ht 5' (1.524 m)    Wt 142 lb 3.2 oz (64.5 kg)    SpO2 94%    BMI 27.77 kg/m    Physical Exam Vitals reviewed.  Constitutional:      Appearance: She is well-developed.  HENT:     Head:      Comments: Scar noted right brow.  No ecchymosis or erythema.  No tenderness with palpation Eyes:     Conjunctiva/sclera: Conjunctivae normal.  Cardiovascular:     Rate and Rhythm: Normal rate and regular rhythm.     Pulses: Normal pulses.     Heart sounds: Normal heart sounds.  Pulmonary:     Effort: Pulmonary effort is normal.     Breath sounds: Normal breath sounds. No wheezing, rhonchi or rales.  Chest:       Comments: Slight tenderness right lateral ribs.  No deformity, edema, ecchymosis, erythema. Skin:    General: Skin is warm and dry.  Neurological:     Mental Status: She is alert.  Psychiatric:        Speech: Speech normal.        Behavior: Behavior normal.        Thought Content: Thought content normal.       Assessment &  Plan:   Problem List Items Addressed This Visit       Other   Depression, major, single episode, complete remission (HCC)    Chronic, stable.  Continue luvox 100 mg, trazodone 50 mg, and Wellbutrin 300 mg      H/O fall - Primary    Discussed following the patient today.She declines with x-ray of right side as tenderness has improved and no gross abnormalities on exam today.  No headache or vision changes or loss of consciousness.  She is not on anticoagulation.  We discussed today concerned with nature of fall and that physical therapy to work on gait and balance would be of utmost importance.  She is currently in physical therapy for her low back at [facility] and I have placed a new referral for physical therapy to also focus on balance, gait and fall prevention      Relevant Orders   Ambulatory referral to Physical Therapy      I am having [name] "[place]" maintain her Fluticasone Furoate (FLONASE SENSIMIST NA), Biotin, diclofenac Sodium, Prolia, PreviDent 5000 Plus, traZODone, fluvoxaMINE, baclofen, valACYclovir, triamcinolone, mupirocin ointment, Clotrimazole, gabapentin, traMADol, atorvastatin, buPROPion, pantoprazole, and gabapentin.   No orders of the defined types were placed in this encounter.   Return precautions given.   Risks, benefits, and alternatives of the medications and treatment plan prescribed today were discussed, and patient expressed understanding.   Education regarding symptom management and diagnosis given to patient on AVS.  Continue to follow with Burnard Hawthorne, FNP for routine health maintenance.   [name] and I agreed with plan.   Mable Paris, FNP

## 2021-12-27 NOTE — Assessment & Plan Note (Signed)
Chronic, stable.  Continue luvox 100 mg, trazodone 50 mg, and Wellbutrin 300 mg ?

## 2021-12-27 NOTE — Patient Instructions (Addendum)
Referral back to [employer] [facility] after recent fall to work on gait training, lower extremity strength. ? ?Let us know if you dont hear back within a week in regards to an appointment being scheduled.  ? ?Please let me know of any recurrence of falls. ?Nice to see you as always! ?

## 2021-12-28 DIAGNOSIS — M545 Low back pain, unspecified: Secondary | ICD-10-CM | POA: Diagnosis not present

## 2021-12-28 DIAGNOSIS — M5416 Radiculopathy, lumbar region: Secondary | ICD-10-CM | POA: Diagnosis not present

## 2021-12-28 DIAGNOSIS — M6281 Muscle weakness (generalized): Secondary | ICD-10-CM | POA: Diagnosis not present

## 2021-12-30 DIAGNOSIS — M5416 Radiculopathy, lumbar region: Secondary | ICD-10-CM | POA: Diagnosis not present

## 2021-12-30 DIAGNOSIS — M6281 Muscle weakness (generalized): Secondary | ICD-10-CM | POA: Diagnosis not present

## 2021-12-30 DIAGNOSIS — M545 Low back pain, unspecified: Secondary | ICD-10-CM | POA: Diagnosis not present

## 2021-12-31 DIAGNOSIS — M545 Low back pain, unspecified: Secondary | ICD-10-CM | POA: Diagnosis not present

## 2022-01-03 DIAGNOSIS — M810 Age-related osteoporosis without current pathological fracture: Secondary | ICD-10-CM | POA: Diagnosis not present

## 2022-01-04 DIAGNOSIS — M545 Low back pain, unspecified: Secondary | ICD-10-CM | POA: Diagnosis not present

## 2022-01-04 DIAGNOSIS — M6281 Muscle weakness (generalized): Secondary | ICD-10-CM | POA: Diagnosis not present

## 2022-01-04 DIAGNOSIS — M5416 Radiculopathy, lumbar region: Secondary | ICD-10-CM | POA: Diagnosis not present

## 2022-01-06 DIAGNOSIS — M6281 Muscle weakness (generalized): Secondary | ICD-10-CM | POA: Diagnosis not present

## 2022-01-06 DIAGNOSIS — M13861 Other specified arthritis, right knee: Secondary | ICD-10-CM | POA: Diagnosis not present

## 2022-01-06 DIAGNOSIS — M5416 Radiculopathy, lumbar region: Secondary | ICD-10-CM | POA: Diagnosis not present

## 2022-01-06 DIAGNOSIS — M545 Low back pain, unspecified: Secondary | ICD-10-CM | POA: Diagnosis not present

## 2022-01-06 DIAGNOSIS — M79661 Pain in right lower leg: Secondary | ICD-10-CM | POA: Diagnosis not present

## 2022-01-06 DIAGNOSIS — M25561 Pain in right knee: Secondary | ICD-10-CM | POA: Diagnosis not present

## 2022-01-13 DIAGNOSIS — M5416 Radiculopathy, lumbar region: Secondary | ICD-10-CM | POA: Diagnosis not present

## 2022-01-13 DIAGNOSIS — M545 Low back pain, unspecified: Secondary | ICD-10-CM | POA: Diagnosis not present

## 2022-01-13 DIAGNOSIS — M6281 Muscle weakness (generalized): Secondary | ICD-10-CM | POA: Diagnosis not present

## 2022-01-18 ENCOUNTER — Encounter: Payer: Self-pay | Admitting: Family

## 2022-01-18 DIAGNOSIS — M1711 Unilateral primary osteoarthritis, right knee: Secondary | ICD-10-CM | POA: Diagnosis not present

## 2022-01-18 DIAGNOSIS — S86891A Other injury of other muscle(s) and tendon(s) at lower leg level, right leg, initial encounter: Secondary | ICD-10-CM | POA: Diagnosis not present

## 2022-01-19 ENCOUNTER — Other Ambulatory Visit: Payer: Self-pay

## 2022-01-19 DIAGNOSIS — Z9181 History of falling: Secondary | ICD-10-CM | POA: Diagnosis not present

## 2022-01-19 DIAGNOSIS — R2681 Unsteadiness on feet: Secondary | ICD-10-CM | POA: Diagnosis not present

## 2022-01-19 DIAGNOSIS — M5416 Radiculopathy, lumbar region: Secondary | ICD-10-CM | POA: Diagnosis not present

## 2022-01-19 DIAGNOSIS — M6281 Muscle weakness (generalized): Secondary | ICD-10-CM | POA: Diagnosis not present

## 2022-01-19 MED ORDER — TRAZODONE HCL 50 MG PO TABS: 100.0000 mg | ORAL_TABLET | Freq: Every evening | ORAL | 3 refills | Status: DC | PRN

## 2022-01-19 NOTE — Telephone Encounter (Signed)
Spoke to patient just to make sure she was ok and to ask if she was ok with increasing her Trazadone from '50mg'$  to '100mg'$ . Patient was agreeable to doing so and also scheduled a follow up appointment for 01/28/22 ?

## 2022-01-21 DIAGNOSIS — M5416 Radiculopathy, lumbar region: Secondary | ICD-10-CM | POA: Diagnosis not present

## 2022-01-21 DIAGNOSIS — R2681 Unsteadiness on feet: Secondary | ICD-10-CM | POA: Diagnosis not present

## 2022-01-21 DIAGNOSIS — Z9181 History of falling: Secondary | ICD-10-CM | POA: Diagnosis not present

## 2022-01-21 DIAGNOSIS — M6281 Muscle weakness (generalized): Secondary | ICD-10-CM | POA: Diagnosis not present

## 2022-01-27 DIAGNOSIS — M5416 Radiculopathy, lumbar region: Secondary | ICD-10-CM | POA: Diagnosis not present

## 2022-01-27 DIAGNOSIS — Z9181 History of falling: Secondary | ICD-10-CM | POA: Diagnosis not present

## 2022-01-27 DIAGNOSIS — R2681 Unsteadiness on feet: Secondary | ICD-10-CM | POA: Diagnosis not present

## 2022-01-27 DIAGNOSIS — M6281 Muscle weakness (generalized): Secondary | ICD-10-CM | POA: Diagnosis not present

## 2022-01-28 ENCOUNTER — Ambulatory Visit: Payer: Medicare PPO | Admitting: Family

## 2022-01-31 ENCOUNTER — Other Ambulatory Visit: Payer: Self-pay | Admitting: Family

## 2022-01-31 DIAGNOSIS — M5137 Other intervertebral disc degeneration, lumbosacral region: Secondary | ICD-10-CM

## 2022-02-01 DIAGNOSIS — R2681 Unsteadiness on feet: Secondary | ICD-10-CM | POA: Diagnosis not present

## 2022-02-01 DIAGNOSIS — Z9181 History of falling: Secondary | ICD-10-CM | POA: Diagnosis not present

## 2022-02-01 DIAGNOSIS — M5416 Radiculopathy, lumbar region: Secondary | ICD-10-CM | POA: Diagnosis not present

## 2022-02-01 DIAGNOSIS — M6281 Muscle weakness (generalized): Secondary | ICD-10-CM | POA: Diagnosis not present

## 2022-02-03 ENCOUNTER — Encounter: Payer: Self-pay | Admitting: Family

## 2022-02-08 ENCOUNTER — Other Ambulatory Visit: Payer: Self-pay | Admitting: Family

## 2022-02-09 ENCOUNTER — Ambulatory Visit: Payer: Medicare PPO | Admitting: Family

## 2022-02-09 ENCOUNTER — Encounter: Payer: Self-pay | Admitting: Family

## 2022-02-09 DIAGNOSIS — J310 Chronic rhinitis: Secondary | ICD-10-CM

## 2022-02-09 DIAGNOSIS — F325 Major depressive disorder, single episode, in full remission: Secondary | ICD-10-CM

## 2022-02-09 DIAGNOSIS — R143 Flatulence: Secondary | ICD-10-CM | POA: Diagnosis not present

## 2022-02-09 MED ORDER — TRAZODONE HCL 50 MG PO TABS: 50.0000 mg | ORAL_TABLET | Freq: Every day | ORAL | 1 refills | Status: DC

## 2022-02-09 NOTE — Progress Notes (Signed)
? ?Subjective:  ? ? Patient ID: Claudia Andersen, female    DOB: 1940-12-27, 81 y.o.   MRN: [id number] ? ?CC: Claudia Andersen is a 81 y.o. female who presents today for follow up.  ? ?HPI: Complains left sided sinus pressure x  7 days, improving ?No nasal congestion, vision changes, HA, eye pain, fever, ear pain, sore throat, rash, cough, sneezing ?She is not taking flonase.  ? ?She continues to have excess gas which has been on going for   a long time ?Beano with some relief.  ?No abomdinal pain ,changes in appeite, consitpation , early satiety ?Avoid lactose ?No caffeine ? ?Occassional alcohol.  ? ? ? ?She didn't increase trazodone to 50 mg 3 weeks ago.  She is feeling better and thinks regimen is working well. She is compliant with luvox 149m, wellbutrin 3020m She declines making any medication changes.  ? ?[facility]  ?No falls ? ?She continues to have excess gas which is not new for her. She takes beano without relief. No abdominal pain, early satiety, weight loss, fever. No food allergies.  ?HISTORY:  ?Past Medical History:  ?Diagnosis Date  ? Ankle fracture, bimalleolar, closed, right, initial encounter 10/26/2018  ? d/t fall 10/26/18  ? Arthritis   ? generalized  ? Back abrasion   ? CRUSHED VERTEBRAE  ? Cataract   ? bilateral sx   ? Closed right radial fracture 10/26/2018  ? s/p a fall  ? Compression of lumbar vertebra ([facility]  ? L1 and L4 (from fall November 05, 2018)  ? Degenerative disc disease, lumbar   ? Depression   ? hx of-on meds  ? Gallstones   ? GERD (gastroesophageal reflux disease)   ? on meds  ? Migraines   ? Osteoporosis   ? on Prolia inj  ? Pulmonary emboli ([facility]  ? history of emboli  ? ?Past Surgical History:  ?Procedure Laterality Date  ? ABDOMINAL HYSTERECTOMY    ? APPENDECTOMY    ? CATARACT EXTRACTION W/PHACO Right 03/27/2018  ? Procedure: CATARACT EXTRACTION PHACO AND INTRAOCULAR LENS PLACEMENT ([facility]);  Surgeon: PoBirder Robson MD;  Location: [facility];  Service: Ophthalmology;  Laterality: Right;  [place] ?AP% 15.3 ?CDE 5.18 ?Fluid pack lot # [id number]  ? CATARACT EXTRACTION W/PHACO Left 06/26/2018  ? Procedure: CATARACT EXTRACTION PHACO AND INTRAOCULAR LENS PLACEMENT ([facility]);  Surgeon: PoBirder RobsonMD;  Location: [facility];  Service: Ophthalmology;  Laterality: Left;  [place]38 ?AP% 15.2 ?CDE 5.80 ?Fluid pack lot # [id number]  ? FRACTURE SURGERY Right   ? RIGHT RADIAL wrist  ? METATARSAL HEAD EXCISION Right 04/01/2021  ? Procedure: METATARSAL HEAD EXCISION- Tailors Exostectomy -Right;  Surgeon: BaCaroline MoreDPM;  [place]: [place];  Laterality: Right;  CANNOT arrive before 9:30 Anesthesia- Mac + local  ? TONSILLECTOMY AND ADENOIDECTOMY    ? 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  ? Procedure: TOTAL HIP ARTHROPLASTY ANTERIOR APPROACH;  Surgeon: BoLovell SheehanMD;  Location: [facility];  Service: Orthopedics;  Laterality: Right;  ? TOTAL KNEE ARTHROPLASTY Left   ? TRIPLE SUBTALAR FUSION    ? WISDOM TOOTH EXTRACTION    ? ?Family History  ?Problem Relation Age of Onset  ? Arthritis Mother   ? Stroke Mother   ? Hemachromatosis Mother   ? Heart disease Father   ? Pneumonia Maternal Grandmother   ? Heart disease Maternal Grandfather   ?  Heart disease Paternal Grandmother   ? Heart disease Paternal Grandfather   ? Breast cancer Neg Hx   ? Colon cancer Neg Hx   ? Colon polyps Neg Hx   ? Esophageal cancer Neg Hx   ? Rectal cancer Neg Hx   ? Stomach cancer Neg Hx   ? ? ?Allergies: Patient has no known allergies. ?Current Outpatient Medications on File Prior to Visit  ?Medication Sig Dispense Refill  ? atorvastatin (LIPITOR) 10 MG tablet TAKE 1 TABLET BY MOUTH DAILY 90 tablet 3  ? baclofen (LIORESAL) 10 MG tablet TAKE 1 TABLET BY MOUTH 3 TIMES DAILY AS NEEDED FOR MUSCLE SPASMS. 90 each 3  ? Biotin 2500 MCG CAPS Take 2,500 mcg by mouth daily.    ? buPROPion (WELLBUTRIN XL) 300 MG 24 hr tablet TAKE 1 TABLET BY MOUTH  DAILY 90 tablet 1  ? Clotrimazole 1 % OINT Apply 1 application topically 2 (two) times daily. 56.7 g 1  ? denosumab (PROLIA) 60 MG/ML SOSY injection Inject 60 mg into the skin every 6 (six) months.    ? diclofenac Sodium (VOLTAREN) 1 % GEL Apply 4 g topically 4 (four) times daily. (Patient taking differently: Apply 4 g topically 4 (four) times daily as needed.) 100 g 1  ? Fluticasone Furoate (FLONASE SENSIMIST NA) Place 1 spray into both nostrils daily as needed (allergies).     ? fluvoxaMINE (LUVOX) 100 MG tablet Take 100 mg by mouth daily.    ? gabapentin (NEURONTIN) 100 MG capsule TAKE 2 CAPUSLES BY MOUTH EVERY MORNING AND 2 AT NOON 360 capsule 1  ? gabapentin (NEURONTIN) 300 MG capsule TAKE 1 CAPSULE BY MOUTH NIGHTLY 90 capsule 1  ? mupirocin ointment (BACTROBAN) 2 % Apply 1 application topically 2 (two) times daily. 22 g 1  ? pantoprazole (PROTONIX) 40 MG tablet TAKE ONE TABLET (40 MG) BY MOUTH EVERY DAY 1 HOUR BEFORE BREAKFAST 90 tablet 1  ? PREVIDENT 5000 PLUS 1.1 % CREA dental cream Take by mouth in the morning and at bedtime.    ? traMADol (ULTRAM) 50 MG tablet tramadol 50 mg tablet ? TAKE ONE TABLET BY MOUTH EVERY 6 HOURS AS NEEDED    ? triamcinolone (KENALOG) 0.025 % cream Apply 1 application topically 2 (two) times daily. 15 g 1  ? valACYclovir (VALTREX) 1000 MG tablet TAKE TWO TABLETS BY MOUTH EVERY 12 HOURSFOR 1 DAY. **START AS SOON AS POSSIBLE AFTER SYMPTOM ONSET** 6 tablet 1  ? hydrochlorothiazide (MICROZIDE) 12.5 MG capsule TAKE ONE CAPSULE DAILY AS NEEDED 30 capsule 3  ? ?No current facility-administered medications on file prior to visit.  ? ? ?Social History  ? ?Tobacco Use  ? Smoking status: Former  ?  Packs/day: 0.50  ?  Years: 22.00  ?  Pack years: 11.00  ?  Types: Cigarettes  ?  Quit date: 56  ?  Years since quitting: 41.3  ? Smokeless tobacco: Never  ?Vaping Use  ? Vaping Use: Never used  ?Substance Use Topics  ? Alcohol use: Yes  ?  Alcohol/week: 10.0 standard drinks  ?  Types: 10  Standard drinks or equivalent per week  ? Drug use: Never  ? ? ?Review of Systems  ?Constitutional:  Negative for chills and fever.  ?HENT:  Positive for congestion.   ?Respiratory:  Negative for cough.   ?Cardiovascular:  Negative for chest pain and palpitations.  ?Gastrointestinal:  Negative for nausea and vomiting.  ?   ?Objective:  ?  ?BP 118/78 (BP  Location: Left Arm, Patient Position: Sitting, Cuff Size: Normal)   Pulse 66   Temp 97.7 ?F (36.5 ?C) (Oral)   Ht 5' (1.524 m)   Wt 143 lb (64.9 kg)   SpO2 95%   BMI 27.93 kg/m?  ?BP Readings from Last 3 Encounters:  ?02/09/22 118/78  ?12/27/21 132/72  ?08/25/21 124/74  ? ?Wt Readings from Last 3 Encounters:  ?02/09/22 143 lb (64.9 kg)  ?12/27/21 142 lb 3.2 oz (64.5 kg)  ?10/26/21 136 lb (61.7 kg)  ? ? ?Physical Exam ?Vitals reviewed.  ?Constitutional:   ?   Appearance: She is well-developed.  ?HENT:  ?   Head: Normocephalic and atraumatic.  ?   Right Ear: Hearing, tympanic membrane, ear canal and external ear normal. No decreased hearing noted. No drainage, swelling or tenderness. No middle ear effusion. No foreign body. Tympanic membrane is not erythematous or bulging.  ?   Left Ear: Hearing, tympanic membrane, ear canal and external ear normal. No decreased hearing noted. No drainage, swelling or tenderness.  No middle ear effusion. No foreign body. Tympanic membrane is not erythematous or bulging.  ?   Nose: Nose normal. No rhinorrhea.  ?   Right Sinus: No maxillary sinus tenderness or frontal sinus tenderness.  ?   Left Sinus: No maxillary sinus tenderness or frontal sinus tenderness.  ?   Mouth/Throat:  ?   Pharynx: Uvula midline. No oropharyngeal exudate or posterior oropharyngeal erythema.  ?   Tonsils: No tonsillar abscesses.  ?Eyes:  ?   Conjunctiva/sclera: Conjunctivae normal.  ?Cardiovascular:  ?   Rate and Rhythm: Regular rhythm.  ?   Pulses: Normal pulses.  ?   Heart sounds: Normal heart sounds.  ?Pulmonary:  ?   Effort: Pulmonary effort is  normal.  ?   Breath sounds: Normal breath sounds. No wheezing, rhonchi or rales.  ?Lymphadenopathy:  ?   Head:  ?   Right side of head: No submental, submandibular, tonsillar, preauricular, posterior auricular or occipital

## 2022-02-09 NOTE — Patient Instructions (Addendum)
Start flonase for sinus congestion ? ?Consider trial of over the counter pepcid ac taken at bedtime for symptom of acid reflux ? ?Increase beano to 3 tablets after each meal ? ?Avoid artificial sugar, lactose, caffeine ? ? ?Consider FOD map diet to see if helpful ? ?Low-FODMAP Eating Plan ? ?FODMAP stands for fermentable oligosaccharides, disaccharides, monosaccharides, and polyols. These are sugars that are hard for some people to digest. A low-FODMAP eating plan may help some people who have irritable bowel syndrome (IBS) and certain other bowel (intestinal) diseases to manage their symptoms. ?This meal plan can be complicated to follow. Work with a diet and nutrition specialist (dietitian) to make a low-FODMAP eating plan that is right for you. A dietitian can help make sure that you get enough nutrition from this diet. ?What are tips for following this plan? ?Reading food labels ?Check labels for hidden FODMAPs such as: ?High-fructose syrup. ?Honey. ?Agave. ?Natural fruit flavors. ?Onion or garlic powder. ?Choose low-FODMAP foods that contain 3-4 grams of fiber per serving. ?Check food labels for serving sizes. Eat only one serving at a time to make sure FODMAP levels stay low. ?Shopping ?Shop with a list of foods that are recommended on this diet and make a meal plan. ?Meal planning ?Follow a low-FODMAP eating plan for up to 6 weeks, or as told by your health care provider or dietitian. ?To follow the eating plan: ?Eliminate high-FODMAP foods from your diet completely. Choose only low-FODMAP foods to eat. You will do this for 2-6 weeks. ?Gradually reintroduce high-FODMAP foods into your diet one at a time. Most people should wait a few days before introducing the next new high-FODMAP food into their meal plan. Your dietitian can recommend how quickly you may reintroduce foods. ?Keep a daily record of what and how much you eat and drink. Make note of any symptoms that you have after eating. ?Review your daily  record with a dietitian regularly to identify which foods you can eat and which foods you should avoid. ?General tips ?Drink enough fluid each day to keep your urine pale yellow. ?Avoid processed foods. These often have added sugar and may be high in FODMAPs. ?Avoid most dairy products, whole grains, and sweeteners. ?Work with a dietitian to make sure you get enough fiber in your diet. ?Avoid high FODMAP foods at meals to manage symptoms. ?Recommended foods ?Fruits ?Bananas, oranges, tangerines, lemons, limes, blueberries, raspberries, strawberries, grapes, cantaloupe, honeydew melon, kiwi, papaya, passion fruit, and pineapple. Limited amounts of dried cranberries, banana chips, and shredded coconut. ?Vegetables ?Eggplant, zucchini, cucumber, peppers, green beans, bean sprouts, lettuce, arugula, kale, Swiss chard, spinach, collard greens, bok choy, summer squash, potato, and tomato. Limited amounts of corn, carrot, and sweet potato. Green parts of scallions. ?Grains ?Gluten-free grains, such as rice, oats, buckwheat, quinoa, corn, polenta, and millet. Gluten-free pasta, bread, or cereal. Rice noodles. Corn tortillas. ?Meats and other proteins ?Unseasoned beef, pork, poultry, or fish. Eggs. Berniece Salines. Tofu (firm) and tempeh. Limited amounts of nuts and seeds, such as almonds, walnuts, [place] nuts, pecans, peanuts, nut butters, pumpkin seeds, chia seeds, and sunflower seeds. ?Dairy ?Lactose-free milk, yogurt, and kefir. Lactose-free cottage cheese and ice cream. Non-dairy milks, such as almond, coconut, hemp, and rice milk. Non-dairy yogurt. Limited amounts of goat cheese, brie, mozzarella, parmesan, swiss, and other hard cheeses. ?Fats and oils ?Butter-free spreads. Vegetable oils, such as olive, canola, and sunflower oil. ?Seasoning and other foods ?Artificial sweeteners with names that do not end in "ol," such as aspartame, saccharine,  and stevia. Maple syrup, white table sugar, raw sugar, brown sugar, and molasses.  Mayonnaise, soy sauce, and tamari. Fresh basil, coriander, parsley, rosemary, and thyme. ?Beverages ?Water and mineral water. Sugar-sweetened soft drinks. Small amounts of orange juice or cranberry juice. Black and green tea. Most dry wines. Coffee. ?The items listed above may not be a complete list of foods and beverages you can eat. Contact a dietitian for more information. ?Foods to avoid ?Fruits ?Fresh, dried, and juiced forms of apple, pear, watermelon, peach, plum, cherries, apricots, blackberries, boysenberries, figs, nectarines, and mango. Avocado. ?Vegetables ?Chicory root, artichoke, asparagus, cabbage, snow peas, Brussels sprouts, broccoli, sugar snap peas, mushrooms, celery, and cauliflower. Onions, garlic, leeks, and the white part of scallions. ?Grains ?Wheat, including kamut, durum, and semolina. Barley and bulgur. Couscous. Wheat-based cereals. Wheat noodles, bread, crackers, and pastries. ?Meats and other proteins ?Fried or fatty meat. Sausage. Cashews and pistachios. Soybeans, baked beans, black beans, chickpeas, kidney beans, fava beans, navy beans, lentils, black-eyed peas, and split peas. ?Dairy ?Milk, yogurt, ice cream, and soft cheese. Cream and sour cream. Milk-based sauces. Custard. Buttermilk. Soy milk. ?Seasoning and other foods ?Any sugar-free gum or candy. Foods that contain artificial sweeteners such as sorbitol, mannitol, isomalt, or xylitol. Foods that contain honey, high-fructose corn syrup, or agave. Bouillon, vegetable stock, beef stock, and chicken stock. Garlic and onion powder. Condiments made with onion, such as hummus, chutney, pickles, relish, salad dressing, and salsa. Tomato paste. ?Beverages ?Chicory-based drinks. Coffee substitutes. Chamomile tea. Fennel tea. Sweet or fortified wines such as port or sherry. Diet soft drinks made with isomalt, mannitol, maltitol, sorbitol, or xylitol. Apple, pear, and mango juice. Juices with high-fructose corn syrup. ?The items listed  above may not be a complete list of foods and beverages you should avoid. Contact a dietitian for more information. ?Summary ?FODMAP stands for fermentable oligosaccharides, disaccharides, monosaccharides, and polyols. These are sugars that are hard for some people to digest. ?A low-FODMAP eating plan is a short-term diet that helps to ease symptoms of certain bowel diseases. ?The eating plan usually lasts up to 6 weeks. After that, high-FODMAP foods are reintroduced gradually and one at a time. This can help you find out which foods may be causing symptoms. ?A low-FODMAP eating plan can be complicated. It is best to work with a dietitian who has experience with this type of plan. ?This information is not intended to replace advice given to you by your health care provider. Make sure you discuss any questions you have with your health care provider. ?Document Revised: 02/20/2020 Document Reviewed: 02/20/2020 ?Elsevier Patient Education ? [place]. ? ?

## 2022-02-11 DIAGNOSIS — R143 Flatulence: Secondary | ICD-10-CM | POA: Insufficient documentation

## 2022-02-11 DIAGNOSIS — J310 Chronic rhinitis: Secondary | ICD-10-CM | POA: Insufficient documentation

## 2022-02-11 DIAGNOSIS — R197 Diarrhea, unspecified: Secondary | ICD-10-CM | POA: Insufficient documentation

## 2022-02-11 NOTE — Assessment & Plan Note (Signed)
Chronic , stable. Continue trazodone '50mg'$ , luvox '100mg'$ , wellbutrin '300mg'$  ?

## 2022-02-11 NOTE — Assessment & Plan Note (Addendum)
Chronic. Advised trial OTC pepcid ac taken at bedtime for symptom of acid reflux,Increase beano to 3 tablets after each meal ?Advised to avoid artificial sugar, lactose, caffeine and to consider FOD map diet to see if helpful. Will follow.  ?

## 2022-02-11 NOTE — Assessment & Plan Note (Signed)
Improving. While improving advised less likely to bacterial and that starting flonase was reasonable. If doesn't completely resolve, advised to let me know.  ?

## 2022-02-15 DIAGNOSIS — M5416 Radiculopathy, lumbar region: Secondary | ICD-10-CM | POA: Diagnosis not present

## 2022-02-15 DIAGNOSIS — Z9181 History of falling: Secondary | ICD-10-CM | POA: Diagnosis not present

## 2022-02-15 DIAGNOSIS — R2681 Unsteadiness on feet: Secondary | ICD-10-CM | POA: Diagnosis not present

## 2022-02-15 DIAGNOSIS — M6281 Muscle weakness (generalized): Secondary | ICD-10-CM | POA: Diagnosis not present

## 2022-02-17 DIAGNOSIS — M6281 Muscle weakness (generalized): Secondary | ICD-10-CM | POA: Diagnosis not present

## 2022-02-17 DIAGNOSIS — M5416 Radiculopathy, lumbar region: Secondary | ICD-10-CM | POA: Diagnosis not present

## 2022-02-17 DIAGNOSIS — Z9181 History of falling: Secondary | ICD-10-CM | POA: Diagnosis not present

## 2022-02-17 DIAGNOSIS — R2681 Unsteadiness on feet: Secondary | ICD-10-CM | POA: Diagnosis not present

## 2022-02-18 ENCOUNTER — Other Ambulatory Visit: Payer: Self-pay | Admitting: Family

## 2022-02-19 ENCOUNTER — Encounter: Payer: Self-pay | Admitting: Family

## 2022-02-20 NOTE — Progress Notes (Signed)
? ? ? ? ?MRN : [id number] ? ?Claudia Andersen is a 81 y.o. ([birth year]) female who presents with chief complaint of legs swell. ? ?History of Present Illness:  ? ?The patient returns to the office for followup evaluation regarding leg swelling.  The swelling has persisted but with the lymph pump is under much, much better controlled. The pain associated with swelling is decreased. There have not been any interval development of a ulcerations or wounds. ? ?The patient denies problems with the pump, noting it is working well and the leggings are in good condition. ? ?Since the previous visit the patient has been wearing graduated compression stockings and using the lymph pump on a routine basis and  has noted significant improvement in the lymphedema.  ? ?Patient stated the lymph pump has been helpful with the treatment of the lymphedema.   ? ?No outpatient medications have been marked as taking for the 02/21/22 encounter (Appointment) with Delana Meyer, Dolores Lory, MD.  ? ? ?Past Medical History:  ?Diagnosis Date  ? Ankle fracture, bimalleolar, closed, right, initial encounter 10/26/2018  ? d/t fall 10/26/18  ? Arthritis   ? generalized  ? Back abrasion   ? CRUSHED VERTEBRAE  ? Cataract   ? bilateral sx   ? Closed right radial fracture 10/26/2018  ? s/p a fall  ? Compression of lumbar vertebra ([place])   ? L1 and L4 (from fall November 05, 2018)  ? Degenerative disc disease, lumbar   ? Depression   ? hx of-on meds  ? Gallstones   ? GERD (gastroesophageal reflux disease)   ? on meds  ? Migraines   ? Osteoporosis   ? on Prolia inj  ? Pulmonary emboli ([place])   ? history of emboli  ? ? ?Past Surgical History:  ?Procedure Laterality Date  ? ABDOMINAL HYSTERECTOMY    ? APPENDECTOMY    ? CATARACT EXTRACTION W/PHACO Right 03/27/2018  ? Procedure: CATARACT EXTRACTION PHACO AND INTRAOCULAR LENS PLACEMENT (IOC);  Surgeon: Birder Robson, MD;  Location: [facility];  Service: Ophthalmology;  Laterality: Right;  [place] 00:33 ?AP% 15.3 ?CDE 5.18 ?Fluid  pack lot # [id number]  ? CATARACT EXTRACTION W/PHACO Left 06/26/2018  ? Procedure: CATARACT EXTRACTION PHACO AND INTRAOCULAR LENS PLACEMENT (IOC);  Surgeon: Birder Robson, MD;  Location: [facility];  Service: Ophthalmology;  Laterality: Left;  [place] 00:38 ?AP% 15.2 ?CDE 5.80 ?Fluid pack lot # [id number]  ? FRACTURE SURGERY Right   ? RIGHT RADIAL wrist  ? METATARSAL HEAD EXCISION Right 04/01/2021  ? Procedure: METATARSAL HEAD EXCISION- Tailors Exostectomy -Right;  Surgeon: Caroline More, DPM;  Location: [place];  Service: Podiatry;  Laterality: Right;  CANNOT arrive before 9:30 Anesthesia- Mac + local  ? TONSILLECTOMY AND ADENOIDECTOMY    ? 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  ? Procedure: TOTAL HIP ARTHROPLASTY ANTERIOR APPROACH;  Surgeon: Lovell Sheehan, MD;  Location: [facility];  Service: Orthopedics;  Laterality: Right;  ? TOTAL KNEE ARTHROPLASTY Left   ? TRIPLE SUBTALAR FUSION    ? WISDOM TOOTH EXTRACTION    ? ? ?Social History ?Social History  ? ?Tobacco Use  ? Smoking status: Former  ?  Packs/day: 0.50  ?  Years: 22.00  ?  Pack years: 11.00  ?  Types: Cigarettes  ?  Quit date: 42  ?  Years since quitting: 41.3  ? Smokeless tobacco: Never  ?Vaping Use  ? Vaping Use: Never used  ?Substance Use Topics  ? Alcohol use: Yes  ?  Alcohol/week: 10.0 standard drinks  ?  Types: 10 Standard drinks or equivalent per week  ? Drug use: Never  ? ? ?Family History ?Family History  ?Problem Relation Age of Onset  ? Arthritis Mother   ? Stroke Mother   ? Hemachromatosis Mother   ? Heart disease Father   ? Pneumonia Maternal Grandmother   ? Heart disease Maternal Grandfather   ? Heart disease Paternal Grandmother   ? Heart disease Paternal Grandfather   ? Breast cancer Neg Hx   ? Colon cancer Neg Hx   ? Colon polyps Neg Hx   ? Esophageal cancer Neg Hx   ? Rectal cancer Neg Hx   ? Stomach cancer Neg Hx   ? ? ?No Known Allergies ? ? ?REVIEW OF SYSTEMS (Negative unless checked) ? ?Constitutional: _0 Weight loss  _1 Fever   _2 Chills ?Cardiac: _3 Chest pain   _4 Chest pressure   _5 Palpitations   _6 Shortness of breath when laying flat   _7 Shortness of breath with exertion. ?Vascular:  _8 Pain in legs with walking   _9 Pain in legs with standing  _10 History of DVT   _11 Phlebitis   _12 Swelling in legs   _13 Varicose veins   _14 Non-healing ulcers ?Pulmonary:   _15 Uses home oxygen   _16 Productive cough   _17 Hemoptysis   _18 Wheeze  _19 COPD   _20 Asthma ?Neurologic:  _21 Dizziness   _22 Seizures   _23 History of stroke   _24 History of TIA  _25 Aphasia   _26 Vissual changes   _27 Weakness or numbness in arm   _28 Weakness or numbness in leg ?Musculoskeletal:   _29 Joint swelling   _30 Joint pain   _31 Low back pain ?Hematologic:  _32 Easy bruising  _33 Easy bleeding   _34 Hypercoagulable state   _35 Anemic ?Gastrointestinal:  _36 Diarrhea   _37 Vomiting  _38 Gastroesophageal reflux/heartburn   _39 Difficulty swallowing. ?Genitourinary:  _40 Chronic kidney disease   _41 Difficult urination  _42 Frequent urination   _43 Blood in urine ?Skin:  _44 Rashes   _45 Ulcers  ?Psychological:  _46 History of anxiety   _47  History of major depression. ? ?Physical Examination ? ?There were no vitals filed for this visit. ?There is no height or weight on file to calculate BMI. ?Gen: WD/WN, NAD ?Head: [place]/AT, No temporalis wasting.  ?Ear/Nose/Throat: Hearing grossly intact, nares w/o erythema or drainage, pinna without lesions ?Eyes: PER, EOMI, sclera nonicteric.  ?Neck: Supple, no gross masses.  No JVD.  ?Pulmonary:  Good air movement, no audible wheezing, no use of accessory muscles.  ?Cardiac: RRR, precordium not hyperdynamic. ?Vascular:  scattered varicosities present bilaterally.  Mild venous stasis changes to the legs bilaterally.  2-3+ soft pitting edema  ?Vessel Right Left  ?Radial Palpable Palpable  ?Gastrointestinal: soft, non-distended. No guarding/no peritoneal signs.  ?Musculoskeletal: M/S 5/5 throughout.  No deformity.  ?Neurologic: CN 2-12 intact. Pain and light touch intact in extremities.  Symmetrical.   Speech is fluent. Motor exam as listed above. ?Psychiatric: Judgment intact, Mood & affect appropriate for pt's clinical situation. ?Dermatologic: Venous rashes no ulcers noted.  No changes consistent with cellulitis. ?Lymph : No lichenification or skin changes of chronic lymphedema. ? ?CBC ?Lab Results  ?Component Value Date  ? WBC 9.0 07/06/2021  ? HGB 12.2 07/06/2021  ? HCT 36.1 07/06/2021  ? MCV 96.8 07/06/2021  ? PLT 282 07/06/2021  ? ? ?BMET ?   ?Component Value Date/Time  ? NA 141 07/19/2021 1006  ? K 3.7 07/19/2021 1006  ? CL 104 07/19/2021 1006  ? CO2 27 07/19/2021 1006  ? GLUCOSE 77 07/19/2021 1006  ? BUN 17 07/19/2021 1006  ? CREATININE  0.83 07/19/2021 1006  ? CALCIUM 9.0 07/19/2021 1006  ? GFRNONAA 60 (L) 11/17/2020 1156  ? GFRAA >60 05/08/2020 1836  ? ?CrCl cannot be calculated (Patient's most recent lab result is older than the maximum 21 days allowed.). ? ?COAG ?Lab Results  ?Component Value Date  ? INR 1.1 (H) 11/26/2018  ? ? ?Radiology ?No results found. ? ? ?Assessment/Plan ?1. Lymphedema ?Recommend: ? ?No surgery or intervention at this point in time.   ? ?I have reviewed my discussion with the patient regarding lymphedema and why it  causes symptoms.  Patient will continue wearing graduated compression on a daily basis. The patient should put the compression on first thing in the morning and removing them in the evening. The patient should not sleep in the compression.  ? ?In addition, behavioral modification throughout the day will be continued.  This will include frequent elevation (such as in a recliner), use of over the counter pain medications as needed and exercise such as walking. ? ?The systemic causes for chronic edema such as liver, kidney and cardiac etiologies does not appear to have significant changed over the past year.   ? ?The patient will continue aggressive use of the  lymph pump.  This will continue to improve the edema control and prevent sequela such as ulcers and  infections.  ? ?The patient will follow-up with me on an annual basis.   ? ? A total of 35 minutes was spent with this patient and greater than 50% was spent in counseling and coordination of care with the patient.

## 2022-02-21 ENCOUNTER — Encounter (INDEPENDENT_AMBULATORY_CARE_PROVIDER_SITE_OTHER): Payer: Self-pay | Admitting: Vascular Surgery

## 2022-02-21 ENCOUNTER — Ambulatory Visit (INDEPENDENT_AMBULATORY_CARE_PROVIDER_SITE_OTHER): Payer: Medicare PPO | Admitting: Vascular Surgery

## 2022-02-21 VITALS — BP 156/84 | HR 67 | Resp 16 | Wt 144.0 lb

## 2022-02-21 DIAGNOSIS — M1990 Unspecified osteoarthritis, unspecified site: Secondary | ICD-10-CM | POA: Diagnosis not present

## 2022-02-21 DIAGNOSIS — I10 Essential (primary) hypertension: Secondary | ICD-10-CM | POA: Diagnosis not present

## 2022-02-21 DIAGNOSIS — K219 Gastro-esophageal reflux disease without esophagitis: Secondary | ICD-10-CM | POA: Diagnosis not present

## 2022-02-21 DIAGNOSIS — I890 Lymphedema, not elsewhere classified: Secondary | ICD-10-CM

## 2022-02-24 DIAGNOSIS — M1711 Unilateral primary osteoarthritis, right knee: Secondary | ICD-10-CM | POA: Diagnosis not present

## 2022-02-25 DIAGNOSIS — M2042 Other hammer toe(s) (acquired), left foot: Secondary | ICD-10-CM | POA: Diagnosis not present

## 2022-02-25 DIAGNOSIS — B351 Tinea unguium: Secondary | ICD-10-CM | POA: Diagnosis not present

## 2022-02-25 DIAGNOSIS — M2041 Other hammer toe(s) (acquired), right foot: Secondary | ICD-10-CM | POA: Diagnosis not present

## 2022-02-25 DIAGNOSIS — M21621 Bunionette of right foot: Secondary | ICD-10-CM | POA: Diagnosis not present

## 2022-02-27 ENCOUNTER — Encounter (INDEPENDENT_AMBULATORY_CARE_PROVIDER_SITE_OTHER): Payer: Self-pay | Admitting: Vascular Surgery

## 2022-03-01 DIAGNOSIS — Z9181 History of falling: Secondary | ICD-10-CM | POA: Diagnosis not present

## 2022-03-01 DIAGNOSIS — M6281 Muscle weakness (generalized): Secondary | ICD-10-CM | POA: Diagnosis not present

## 2022-03-01 DIAGNOSIS — R2681 Unsteadiness on feet: Secondary | ICD-10-CM | POA: Diagnosis not present

## 2022-03-01 DIAGNOSIS — M5416 Radiculopathy, lumbar region: Secondary | ICD-10-CM | POA: Diagnosis not present

## 2022-03-03 DIAGNOSIS — R2681 Unsteadiness on feet: Secondary | ICD-10-CM | POA: Diagnosis not present

## 2022-03-03 DIAGNOSIS — Z9181 History of falling: Secondary | ICD-10-CM | POA: Diagnosis not present

## 2022-03-03 DIAGNOSIS — M5416 Radiculopathy, lumbar region: Secondary | ICD-10-CM | POA: Diagnosis not present

## 2022-03-03 DIAGNOSIS — M6281 Muscle weakness (generalized): Secondary | ICD-10-CM | POA: Diagnosis not present

## 2022-03-08 DIAGNOSIS — M6281 Muscle weakness (generalized): Secondary | ICD-10-CM | POA: Diagnosis not present

## 2022-03-08 DIAGNOSIS — R2681 Unsteadiness on feet: Secondary | ICD-10-CM | POA: Diagnosis not present

## 2022-03-08 DIAGNOSIS — Z9181 History of falling: Secondary | ICD-10-CM | POA: Diagnosis not present

## 2022-03-08 DIAGNOSIS — M5416 Radiculopathy, lumbar region: Secondary | ICD-10-CM | POA: Diagnosis not present

## 2022-03-10 DIAGNOSIS — Z9181 History of falling: Secondary | ICD-10-CM | POA: Diagnosis not present

## 2022-03-10 DIAGNOSIS — R2681 Unsteadiness on feet: Secondary | ICD-10-CM | POA: Diagnosis not present

## 2022-03-10 DIAGNOSIS — M6281 Muscle weakness (generalized): Secondary | ICD-10-CM | POA: Diagnosis not present

## 2022-03-10 DIAGNOSIS — M5416 Radiculopathy, lumbar region: Secondary | ICD-10-CM | POA: Diagnosis not present

## 2022-03-15 DIAGNOSIS — M5416 Radiculopathy, lumbar region: Secondary | ICD-10-CM | POA: Diagnosis not present

## 2022-03-15 DIAGNOSIS — M6281 Muscle weakness (generalized): Secondary | ICD-10-CM | POA: Diagnosis not present

## 2022-03-15 DIAGNOSIS — Z9181 History of falling: Secondary | ICD-10-CM | POA: Diagnosis not present

## 2022-03-15 DIAGNOSIS — R2681 Unsteadiness on feet: Secondary | ICD-10-CM | POA: Diagnosis not present

## 2022-03-17 DIAGNOSIS — R2681 Unsteadiness on feet: Secondary | ICD-10-CM | POA: Diagnosis not present

## 2022-03-17 DIAGNOSIS — M5416 Radiculopathy, lumbar region: Secondary | ICD-10-CM | POA: Diagnosis not present

## 2022-03-17 DIAGNOSIS — M6281 Muscle weakness (generalized): Secondary | ICD-10-CM | POA: Diagnosis not present

## 2022-03-17 DIAGNOSIS — Z9181 History of falling: Secondary | ICD-10-CM | POA: Diagnosis not present

## 2022-03-19 ENCOUNTER — Other Ambulatory Visit: Payer: Self-pay | Admitting: Family

## 2022-03-19 DIAGNOSIS — F325 Major depressive disorder, single episode, in full remission: Secondary | ICD-10-CM

## 2022-03-21 NOTE — Telephone Encounter (Signed)
LMTCB to see if patient taking 50 mg dose or 100 mg dose of Luvox?

## 2022-03-21 NOTE — Telephone Encounter (Signed)
Patient returned office phone call. She is taking 50 mg of Luvox a day.

## 2022-03-22 DIAGNOSIS — M6281 Muscle weakness (generalized): Secondary | ICD-10-CM | POA: Diagnosis not present

## 2022-03-22 DIAGNOSIS — M5416 Radiculopathy, lumbar region: Secondary | ICD-10-CM | POA: Diagnosis not present

## 2022-03-22 DIAGNOSIS — R2681 Unsteadiness on feet: Secondary | ICD-10-CM | POA: Diagnosis not present

## 2022-03-22 DIAGNOSIS — Z9181 History of falling: Secondary | ICD-10-CM | POA: Diagnosis not present

## 2022-03-23 ENCOUNTER — Other Ambulatory Visit: Payer: Self-pay | Admitting: Family

## 2022-03-24 DIAGNOSIS — M5416 Radiculopathy, lumbar region: Secondary | ICD-10-CM | POA: Diagnosis not present

## 2022-03-24 DIAGNOSIS — Z9181 History of falling: Secondary | ICD-10-CM | POA: Diagnosis not present

## 2022-03-24 DIAGNOSIS — R2681 Unsteadiness on feet: Secondary | ICD-10-CM | POA: Diagnosis not present

## 2022-03-24 DIAGNOSIS — M6281 Muscle weakness (generalized): Secondary | ICD-10-CM | POA: Diagnosis not present

## 2022-03-25 DIAGNOSIS — M25561 Pain in right knee: Secondary | ICD-10-CM | POA: Diagnosis not present

## 2022-03-29 ENCOUNTER — Encounter: Payer: Self-pay | Admitting: Family

## 2022-03-29 ENCOUNTER — Ambulatory Visit: Payer: Medicare PPO | Admitting: Family

## 2022-03-29 VITALS — BP 128/76 | HR 71 | Temp 97.4°F | Ht 60.0 in | Wt 139.4 lb

## 2022-03-29 DIAGNOSIS — F325 Major depressive disorder, single episode, in full remission: Secondary | ICD-10-CM

## 2022-03-29 DIAGNOSIS — I890 Lymphedema, not elsewhere classified: Secondary | ICD-10-CM

## 2022-03-29 DIAGNOSIS — M810 Age-related osteoporosis without current pathological fracture: Secondary | ICD-10-CM | POA: Diagnosis not present

## 2022-03-29 DIAGNOSIS — M5137 Other intervertebral disc degeneration, lumbosacral region: Secondary | ICD-10-CM | POA: Diagnosis not present

## 2022-03-29 DIAGNOSIS — G8929 Other chronic pain: Secondary | ICD-10-CM

## 2022-03-29 DIAGNOSIS — M546 Pain in thoracic spine: Secondary | ICD-10-CM

## 2022-03-29 DIAGNOSIS — E785 Hyperlipidemia, unspecified: Secondary | ICD-10-CM | POA: Diagnosis not present

## 2022-03-29 DIAGNOSIS — I10 Essential (primary) hypertension: Secondary | ICD-10-CM

## 2022-03-29 DIAGNOSIS — Z1231 Encounter for screening mammogram for malignant neoplasm of breast: Secondary | ICD-10-CM

## 2022-03-29 DIAGNOSIS — M51379 Other intervertebral disc degeneration, lumbosacral region without mention of lumbar back pain or lower extremity pain: Secondary | ICD-10-CM

## 2022-03-29 MED ORDER — GABAPENTIN 100 MG PO CAPS: 200.0000 mg | ORAL_CAPSULE | Freq: Every day | ORAL | 1 refills | Status: DC

## 2022-03-29 MED ORDER — HYDROCHLOROTHIAZIDE 12.5 MG PO CAPS: 12.5000 mg | ORAL_CAPSULE | Freq: Every day | ORAL | 3 refills | Status: DC | PRN

## 2022-03-29 NOTE — Assessment & Plan Note (Signed)
Chronic, stable.  Continue gabapentin  '200mg'$  qam and '300mg'$  at night is not sedating for patient.

## 2022-03-29 NOTE — Patient Instructions (Signed)
Please let me know if vaginal bleeding persists   please call  and schedule your 3D mammogram and /or bone density scan as we discussed.   [facility]  ( new location in 2023)  [address], [place], [place] [address]  [place], [place]  [phone]   I have ordered transvaginal ultrasound.  Let us know if you dont hear back within a week in regards to an appointment being scheduled.   So that you are aware, if you are [facility] MyChart user , please pay attention to your MyChart messages as you may receive a MyChart message with a phone number to call and schedule this test/appointment own your own from our referral coordinator. This is a new process so I do not want you to miss this message.  If you are not a MyChart user, you will receive a phone call.   

## 2022-03-29 NOTE — Assessment & Plan Note (Signed)
Well-controlled today without hydrochlorothiazide 12.5 mg.  Advised she could spot check blood pressure at home and we can use hydrochlorothiazide as needed.  She will let me know how she is doing

## 2022-03-29 NOTE — Progress Notes (Signed)
Subjective:    Patient ID: Claudia Andersen, female    DOB: 1941/08/23, 81 y.o.   MRN: [id number]  CC: Claudia Andersen is a 81 y.o. female who presents today for follow up.   HPI: Feels well today No new complaints    HTN-  she has not been taking hctz 12.22m . Leg swelling improved with compression stockings  Depression- she is no longer on the wellbutrin as made her feel jittery. She is compliant with luvox 1526m trazodone 5057mnd feel regimen is working well.   Osteoporosis- she follows with Dr SolGabriel Carinaom manages prolia. Prolia given 10/13/21. She is scheduled in July for Prolia.   Low back pain - she is compliant with gabapentin  200m100mm and 300mg6mnight  with relief of back pain.  She does not feel sedated on this regimen  Consult with Dr. HooteMarry Guanalso Dr WeinePara Marchright knee pain; she had corticosteroid injection with resolution of knee pain   Consult with Dr. SchniDelana Meyerular for lymphedema without recommendation for surgery or intervention at this time. HISTORY:  Past Medical History:  Diagnosis Date   Ankle fracture, bimalleolar, closed, right, initial encounter 10/26/2018   d/t fall 10/26/18   Arthritis    generalized   Back abrasion    CRUSHED VERTEBRAE   Cataract    bilateral sx    Closed right radial fracture 10/26/2018   s/p a fall   Compression of lumbar vertebra (HCC)    L1 and L4 (from fall November 05, 2018)   Degenerative disc disease, lumbar    Depression    hx of-on meds   Gallstones    GERD (gastroesophageal reflux disease)    on meds   Migraines    Osteoporosis    on Prolia inj   Pulmonary emboli (HCC)    history of emboli   Past Surgical History:  Procedure Laterality Date   ABDOMINAL HYSTERECTOMY     APPENDECTOMY     CATARACT EXTRACTION W/PHACO Right 03/27/2018   Procedure: CATARACT EXTRACTION PHACO AND INTRAOCULAR LENS PLACEMENT (IOC);Rowletturgeon: PorfiBirder Robson  Location: [facility];  Service: Ophthalmology;  Laterality:  Right;  [place] 00[place]3 AP% 15.3 CDE 5.18 Fluid pack lot # [id number] EXTRACTION W/PHACO Left 06/26/2018   Procedure: CATARACT EXTRACTION PHACO AND INTRAOCULAR LENS PLACEMENT (IOC);Avonurgeon: PorfiBirder Robson  Location: [facility];  Service: Ophthalmology;  Laterality: Left;  [place] 00[place] AP% 15.2 CDE 5.80 Fluid pack lot # [id number] SURGERY Right    RIGHT RADIAL wrist   METATARSAL HEAD EXCISION Right 04/01/2021   Procedure: METATARSAL HEAD EXCISION- Tailors Exostectomy -Right;  Surgeon: BakerCaroline More;  [facility]ry;  Laterality: Right;  CANNOT arrive before 9:30 Anesthesia- Mac + local   TONSILLECTOMY AND ADENOIDECTOMY     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   Procedure: TOTAL HIP ARTHROPLASTY ANTERIOR APPROACH;  Surgeon: BowerLovell Sheehan  Location: [facility];  Service: Orthopedics;  Laterality: Right;   TOTAL KNEE ARTHROPLASTY Left    TRIPLE SUBTALAR FUSION     WISDOM TOOTH EXTRACTION     Family History  Problem Relation Age of Onset   Arthritis Mother    Stroke Mother    Hemachromatosis Mother    Heart disease Father    Pneumonia Maternal Grandmother    Heart disease Maternal Grandfather    Heart disease Paternal Grandmother    Heart disease Paternal Grandfather  Breast cancer Neg Hx    Colon cancer Neg Hx    Colon polyps Neg Hx    Esophageal cancer Neg Hx    Rectal cancer Neg Hx    Stomach cancer Neg Hx     Allergies: Patient has no known allergies. Current Outpatient Medications on File Prior to Visit  Medication Sig Dispense Refill   atorvastatin (LIPITOR) 10 MG tablet TAKE 1 TABLET BY MOUTH DAILY 90 tablet 3   baclofen (LIORESAL) 10 MG tablet TAKE 1 TABLET BY MOUTH 3 TIMES DAILY AS NEEDED FOR MUSCLE SPASMS. 90 each 3   Biotin 2500 MCG CAPS Take 2,500 mcg by mouth daily.     Clotrimazole 1 % OINT Apply 1 application topically 2 (two) times daily. 56.7 g 1   denosumab (PROLIA) 60 MG/ML SOSY injection Inject  60 mg into the skin every 6 (six) months.     diclofenac Sodium (VOLTAREN) 1 % GEL Apply 4 g topically 4 (four) times daily. (Patient taking differently: Apply 4 g topically 4 (four) times daily as needed.) 100 g 1   Fluticasone Furoate (FLONASE SENSIMIST NA) Place 1 spray into both nostrils daily as needed (allergies).      fluvoxaMINE (LUVOX) 100 MG tablet Take 100 mg by mouth daily.     fluvoxaMINE (LUVOX) 50 MG tablet TAKE 1 TABLET BY MOUTH DAILY 90 tablet 1   gabapentin (NEURONTIN) 300 MG capsule TAKE 1 CAPSULE BY MOUTH NIGHTLY 90 capsule 1   mupirocin ointment (BACTROBAN) 2 % Apply 1 application topically 2 (two) times daily. 22 g 1   pantoprazole (PROTONIX) 40 MG tablet TAKE ONE TABLET (40 MG) BY MOUTH EVERY DAY 1 HOUR BEFORE BREAKFAST 90 tablet 1   traZODone (DESYREL) 50 MG tablet Take 1 tablet (50 mg total) by mouth daily. 90 tablet 1   triamcinolone (KENALOG) 0.025 % cream Apply 1 application topically 2 (two) times daily. 15 g 1   valACYclovir (VALTREX) 1000 MG tablet TAKE TWO TABLETS BY MOUTH EVERY 12 HOURSFOR 1 DAY. **START AS SOON AS POSSIBLE AFTER SYMPTOM ONSET** 6 tablet 1   PREVIDENT 5000 PLUS 1.1 % CREA dental cream Take by mouth in the morning and at bedtime.     traMADol (ULTRAM) 50 MG tablet tramadol 50 mg tablet  TAKE ONE TABLET BY MOUTH EVERY 6 HOURS AS NEEDED     No current facility-administered medications on file prior to visit.    Social History   Tobacco Use   Smoking status: Former    Packs/day: 0.50    Years: 22.00    Total pack years: 11.00    Types: Cigarettes    Quit date: 1982    Years since quitting: 41.4   Smokeless tobacco: Never  Vaping Use   Vaping Use: Never used  Substance Use Topics   Alcohol use: Yes    Alcohol/week: 10.0 standard drinks of alcohol    Types: 10 Standard drinks or equivalent per week   Drug use: Never    Review of Systems  Constitutional:  Negative for chills and fever.  Respiratory:  Negative for cough.    Cardiovascular:  Negative for chest pain and palpitations.  Gastrointestinal:  Negative for nausea and vomiting.  Musculoskeletal:  Negative for arthralgias.      Objective:    BP 128/76 (BP Location: Left Arm, Patient Position: Sitting, Cuff Size: Normal)   Pulse 71   Temp (!) 97.4 F (36.3 C) (Oral)   Ht 5' (1.524 m)   Wt 139  lb 6.4 oz (63.2 kg)   SpO2 97%   BMI 27.22 kg/m  BP Readings from Last 3 Encounters:  03/29/22 128/76  02/21/22 (!) 156/84  02/09/22 118/78   Wt Readings from Last 3 Encounters:  03/29/22 139 lb 6.4 oz (63.2 kg)  02/21/22 144 lb (65.3 kg)  02/09/22 143 lb (64.9 kg)    Physical Exam Vitals reviewed.  Constitutional:      Appearance: She is well-developed.  Eyes:     Conjunctiva/sclera: Conjunctivae normal.  Cardiovascular:     Rate and Rhythm: Normal rate and regular rhythm.     Pulses: Normal pulses.     Heart sounds: Normal heart sounds.  Pulmonary:     Effort: Pulmonary effort is normal.     Breath sounds: Normal breath sounds. No wheezing, rhonchi or rales.  Skin:    General: Skin is warm and dry.  Neurological:     Mental Status: She is alert.  Psychiatric:        Speech: Speech normal.        Behavior: Behavior normal.        Thought Content: Thought content normal.        Assessment & Plan:   Problem List Items Addressed This Visit       Cardiovascular and Mediastinum   HTN (hypertension)    Well-controlled today without hydrochlorothiazide 12.5 mg.  Advised she could spot check blood pressure at home and we can use hydrochlorothiazide as needed.  She will let me know how she is doing      Relevant Medications   hydrochlorothiazide (MICROZIDE) 12.5 MG capsule     Musculoskeletal and Integument   Osteoporosis - Primary   Relevant Orders   DG Bone Density     Other   Depression, major, single episode, complete remission (HCC)    Chronic, stable.  Continue luvox 176m, trazodone 573m     HLD (hyperlipidemia)    Relevant Medications   hydrochlorothiazide (MICROZIDE) 12.5 MG capsule   Other Relevant Orders   Lipid panel   Lymphedema   Thoracic back pain    Chronic, stable.  Continue gabapentin  20060mam and 300m34m night is not sedating for patient.      Other Visit Diagnoses     Encounter for screening mammogram for malignant neoplasm of breast       Relevant Orders   MM 3D SCREEN BREAST BILATERAL   DDD (degenerative disc disease), lumbosacral       Relevant Medications   gabapentin (NEURONTIN) 100 MG capsule        I have discontinued SandShanda Bumpsndy"'s buPROPion. I have also changed her gabapentin and hydrochlorothiazide. Additionally, I am having her maintain her Fluticasone Furoate (FLONASE SENSIMIST NA), Biotin, diclofenac Sodium, Prolia, PreviDent 5000 Plus, fluvoxaMINE, triamcinolone, mupirocin ointment, Clotrimazole, traMADol, atorvastatin, pantoprazole, baclofen, traZODone, gabapentin, fluvoxaMINE, and valACYclovir.   Meds ordered this encounter  Medications   gabapentin (NEURONTIN) 100 MG capsule    Sig: Take 2 capsules (200 mg total) by mouth daily.    Dispense:  360 capsule    Refill:  1    Order Specific Question:   Supervising Provider    Answer:   TULLDeborra [id number]]   hydrochlorothiazide (MICROZIDE) 12.5 MG capsule    Sig: Take 1 capsule (12.5 mg total) by mouth daily as needed.    Dispense:  30 capsule    Refill:  3    Order Specific Question:   Supervising Provider  Answer:   Crecencio Mc [[id number]]    Return precautions given.   Risks, benefits, and alternatives of the medications and treatment plan prescribed today were discussed, and patient expressed understanding.   Education regarding symptom management and diagnosis given to patient on AVS.  Continue to follow with Burnard Hawthorne, FNP for routine health maintenance.   [name] and I agreed with plan.   Mable Paris, FNP

## 2022-03-29 NOTE — Assessment & Plan Note (Signed)
Chronic, stable.  Continue luvox '150mg'$ , trazodone '50mg'$ 

## 2022-03-30 ENCOUNTER — Telehealth: Payer: Self-pay | Admitting: Family

## 2022-03-30 NOTE — Telephone Encounter (Signed)
Call pt Rec'd pre op paperwork from [facility]  If she plans to proceed, she will need preop appt with me for labs, ekg  Please sch

## 2022-03-30 NOTE — Telephone Encounter (Signed)
LMTCB to office ?

## 2022-03-31 DIAGNOSIS — R2681 Unsteadiness on feet: Secondary | ICD-10-CM | POA: Diagnosis not present

## 2022-03-31 DIAGNOSIS — M5416 Radiculopathy, lumbar region: Secondary | ICD-10-CM | POA: Diagnosis not present

## 2022-03-31 DIAGNOSIS — M6281 Muscle weakness (generalized): Secondary | ICD-10-CM | POA: Diagnosis not present

## 2022-03-31 DIAGNOSIS — Z9181 History of falling: Secondary | ICD-10-CM | POA: Diagnosis not present

## 2022-03-31 NOTE — Telephone Encounter (Signed)
LMTCB TO OFFICE

## 2022-04-05 DIAGNOSIS — M6281 Muscle weakness (generalized): Secondary | ICD-10-CM | POA: Diagnosis not present

## 2022-04-05 DIAGNOSIS — R2681 Unsteadiness on feet: Secondary | ICD-10-CM | POA: Diagnosis not present

## 2022-04-05 DIAGNOSIS — Z9181 History of falling: Secondary | ICD-10-CM | POA: Diagnosis not present

## 2022-04-05 DIAGNOSIS — M5416 Radiculopathy, lumbar region: Secondary | ICD-10-CM | POA: Diagnosis not present

## 2022-04-05 NOTE — Telephone Encounter (Signed)
Spoke to patient about notes and she stated that she was going to wait until September and she would call to make preop  appt.

## 2022-04-05 NOTE — Telephone Encounter (Signed)
LMTCB to office to discuss if she will need an appt before her Pre op

## 2022-04-07 DIAGNOSIS — M5416 Radiculopathy, lumbar region: Secondary | ICD-10-CM | POA: Diagnosis not present

## 2022-04-07 DIAGNOSIS — R2681 Unsteadiness on feet: Secondary | ICD-10-CM | POA: Diagnosis not present

## 2022-04-07 DIAGNOSIS — Z9181 History of falling: Secondary | ICD-10-CM | POA: Diagnosis not present

## 2022-04-07 DIAGNOSIS — M6281 Muscle weakness (generalized): Secondary | ICD-10-CM | POA: Diagnosis not present

## 2022-04-12 DIAGNOSIS — M5416 Radiculopathy, lumbar region: Secondary | ICD-10-CM | POA: Diagnosis not present

## 2022-04-12 DIAGNOSIS — M6281 Muscle weakness (generalized): Secondary | ICD-10-CM | POA: Diagnosis not present

## 2022-04-12 DIAGNOSIS — R2681 Unsteadiness on feet: Secondary | ICD-10-CM | POA: Diagnosis not present

## 2022-04-12 DIAGNOSIS — Z9181 History of falling: Secondary | ICD-10-CM | POA: Diagnosis not present

## 2022-04-20 ENCOUNTER — Other Ambulatory Visit: Payer: Self-pay | Admitting: Family

## 2022-04-21 ENCOUNTER — Other Ambulatory Visit (INDEPENDENT_AMBULATORY_CARE_PROVIDER_SITE_OTHER): Payer: Medicare PPO

## 2022-04-21 DIAGNOSIS — E785 Hyperlipidemia, unspecified: Secondary | ICD-10-CM

## 2022-04-21 LAB — LIPID PANEL
Cholesterol: 175 mg/dL (ref 0–200)
HDL: 105 mg/dL (ref >39.00)
LDL Cholesterol: 62 mg/dL (ref 0–99)
NonHDL: 70.09
Total CHOL/HDL Ratio: 2
Triglycerides: 41 mg/dL (ref 0.0–149.0)
VLDL: 8.2 mg/dL (ref 0.0–40.0)

## 2022-04-25 DIAGNOSIS — M810 Age-related osteoporosis without current pathological fracture: Secondary | ICD-10-CM | POA: Diagnosis not present

## 2022-05-02 ENCOUNTER — Ambulatory Visit: Payer: Medicare PPO | Admitting: Internal Medicine

## 2022-05-02 ENCOUNTER — Encounter: Payer: Self-pay | Admitting: Internal Medicine

## 2022-05-02 ENCOUNTER — Ambulatory Visit (INDEPENDENT_AMBULATORY_CARE_PROVIDER_SITE_OTHER): Payer: Medicare PPO

## 2022-05-02 VITALS — BP 134/80 | HR 63 | Temp 98.1°F | Resp 17 | Ht 60.0 in | Wt 142.0 lb

## 2022-05-02 DIAGNOSIS — M47814 Spondylosis without myelopathy or radiculopathy, thoracic region: Secondary | ICD-10-CM | POA: Diagnosis not present

## 2022-05-02 DIAGNOSIS — R1032 Left lower quadrant pain: Secondary | ICD-10-CM | POA: Diagnosis not present

## 2022-05-02 DIAGNOSIS — M419 Scoliosis, unspecified: Secondary | ICD-10-CM | POA: Diagnosis not present

## 2022-05-02 DIAGNOSIS — R140 Abdominal distension (gaseous): Secondary | ICD-10-CM | POA: Diagnosis not present

## 2022-05-02 DIAGNOSIS — R1084 Generalized abdominal pain: Secondary | ICD-10-CM | POA: Diagnosis not present

## 2022-05-02 LAB — COMPREHENSIVE METABOLIC PANEL
ALT: 14 U/L (ref 0–35)
AST: 18 U/L (ref 0–37)
Albumin: 4.3 g/dL (ref 3.5–5.2)
Alkaline Phosphatase: 46 U/L (ref 39–117)
BUN: 21 mg/dL (ref 6–23)
CO2: 29 mEq/L (ref 19–32)
Calcium: 9.2 mg/dL (ref 8.4–10.5)
Chloride: 105 mEq/L (ref 96–112)
Creatinine, Ser: 0.83 mg/dL (ref 0.40–1.20)
GFR: 66.81 mL/min (ref >60.00)
Glucose, Bld: 85 mg/dL (ref 70–99)
Potassium: 4.6 mEq/L (ref 3.5–5.1)
Sodium: 141 mEq/L (ref 135–145)
Total Bilirubin: 0.7 mg/dL (ref 0.2–1.2)
Total Protein: 6.3 g/dL (ref 6.0–8.3)

## 2022-05-02 NOTE — Assessment & Plan Note (Signed)
Occurring Post prandially following nearly every meal despite treatment of GERD and use of enzymes.  Adding probiotics.  Checking CMET.  Gastric emptying study needed.  Pyloric stenosis and linnitus plastica unlikely given normal EGD 3 monts ago

## 2022-05-02 NOTE — Progress Notes (Signed)
Subjective:  Patient ID: Claudia Andersen, female    DOB: 1941/07/17  Age: 81 y.o. MRN: [id number]  CC: The primary encounter diagnosis was Generalized abdominal pain. Diagnoses of Abdominal pain, LLQ and Bloating symptom were also pertinent to this visit.   HPI Claudia Andersen presents for  Chief Complaint  Patient presents with   Follow-up    Persistent gas - going on for several months. Pains in stomach.   Claudia Andersen is a health 81 yr old female who presents with a several month history of post prandial bloating and discomfort   (without true pain) described as feeling  overly full,  without nausea. Or unintentional weight loss.  The symptoms occur  immediately after  EATING ,  and improves with stooling and flatus.   Occurs even after eating eggs.  Denies constipation,  has  a loose stool  2 or 3 times per week  .She eats slowly  , does not ingest air.      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  EGD was also done and noted gastritis.   H Pylori negative.  She has been using Beano 2-3 tablets pre meal. Protonix in the am,  and pepcid ac  without  relief.  Uses phazyme for relief of symptoms. Works better than Gas X   US Abdomen and HIDA scan were last done Sept 2021 for RUQ pain: HIDA was  NORMAL    Started taking a probiotic last week,  symptoms have started to improve.    Outpatient Medications Prior to Visit  Medication Sig Dispense Refill   atorvastatin (LIPITOR) 10 MG tablet TAKE 1 TABLET BY MOUTH DAILY 90 tablet 3   baclofen (LIORESAL) 10 MG tablet TAKE 1 TABLET BY MOUTH 3 TIMES DAILY AS NEEDED FOR MUSCLE SPASMS. 90 each 3   Biotin 2500 MCG CAPS Take 2,500 mcg by mouth daily.     Clotrimazole 1 % OINT Apply 1 application topically 2 (two) times daily. 56.7 g 1   denosumab (PROLIA) 60 MG/ML SOSY injection Inject 60 mg into the skin every 6 (six) months.     diclofenac Sodium (VOLTAREN) 1 % GEL Apply 4 g topically 4 (four) times daily. (Patient  taking differently: Apply 4 g topically 4 (four) times daily as needed.) 100 g 1   Fluticasone Furoate (FLONASE SENSIMIST NA) Place 1 spray into both nostrils daily as needed (allergies).      fluvoxaMINE (LUVOX) 100 MG tablet Take 100 mg by mouth daily.     fluvoxaMINE (LUVOX) 50 MG tablet TAKE 1 TABLET BY MOUTH DAILY 90 tablet 1   gabapentin (NEURONTIN) 100 MG capsule Take 2 capsules (200 mg total) by mouth daily. 360 capsule 1   gabapentin (NEURONTIN) 300 MG capsule TAKE 1 CAPSULE BY MOUTH NIGHTLY 90 capsule 1   hydrochlorothiazide (MICROZIDE) 12.5 MG capsule Take 1 capsule (12.5 mg total) by mouth daily as needed. 30 capsule 3   mupirocin ointment (BACTROBAN) 2 % Apply 1 application topically 2 (two) times daily. 22 g 1   pantoprazole (PROTONIX) 40 MG tablet TAKE ONE TABLET (40 MG) BY MOUTH EVERY DAY 1 HOUR BEFORE BREAKFAST 90 tablet 1   PREVIDENT 5000 PLUS 1.1 % CREA dental cream Take by mouth in the morning and at bedtime.     traMADol (ULTRAM) 50 MG tablet tramadol 50 mg tablet  TAKE ONE TABLET BY MOUTH EVERY 6 HOURS AS NEEDED     traZODone (DESYREL) 50  MG tablet TAKE 2 TABLETS BY MOUTH AT BEDTIME AS NEEDED FOR SLEEP 30 tablet 3   triamcinolone (KENALOG) 0.025 % cream Apply 1 application topically 2 (two) times daily. 15 g 1   valACYclovir (VALTREX) 1000 MG tablet TAKE TWO TABLETS BY MOUTH EVERY 12 HOURSFOR 1 DAY. **START AS SOON AS POSSIBLE AFTER SYMPTOM ONSET** 6 tablet 1   No facility-administered medications prior to visit.    Review of Systems;  Patient denies headache, fevers, malaise, unintentional weight loss, skin rash, eye pain, sinus congestion and sinus pain, sore throat, dysphagia,  hemoptysis , cough, dyspnea, wheezing, chest pain, palpitations, orthopnea, edema, abdominal pain, nausea, melena, diarrhea, constipation, flank pain, dysuria, hematuria, urinary  Frequency, nocturia, numbness, tingling, seizures,  Focal weakness, Loss of consciousness,  Tremor, insomnia,  depression, anxiety, and suicidal ideation.      Objective:  BP 134/80 (BP Location: Left Arm, Patient Position: Sitting, Cuff Size: Small)   Pulse 63   Temp 98.1 F (36.7 C) (Temporal)   Resp 17   Ht 5' (1.524 m)   Wt 142 lb (64.4 kg)   SpO2 96%   BMI 27.73 kg/m   BP Readings from Last 3 Encounters:  05/02/22 134/80  03/29/22 128/76  02/21/22 (!) 156/84    Wt Readings from Last 3 Encounters:  05/02/22 142 lb (64.4 kg)  03/29/22 139 lb 6.4 oz (63.2 kg)  02/21/22 144 lb (65.3 kg)    General appearance: alert, cooperative and appears stated age Ears: normal TM's and external ear canals both ears Throat: lips, mucosa, and tongue normal; teeth and gums normal Neck: no adenopathy, no carotid bruit, supple, symmetrical, trachea midline and thyroid not enlarged, symmetric, no tenderness/mass/nodules Back: symmetric, no curvature. ROM normal. No CVA tenderness. Lungs: clear to auscultation bilaterally Heart: regular rate and rhythm, S1, S2 normal, no murmur, click, rub or gallop Abdomen: soft, non-tender; bowel sounds normal; no masses,  no organomegaly Pulses: 2+ and symmetric Skin: Skin color, texture, turgor normal. No rashes or lesions Lymph nodes: Cervical, supraclavicular, and axillary nodes normal.  No results found for: "HGBA1C"  Lab Results  Component Value Date   CREATININE 0.83 07/19/2021   CREATININE 0.86 03/25/2021   CREATININE 0.97 11/17/2020    Lab Results  Component Value Date   WBC 9.0 07/06/2021   HGB 12.2 07/06/2021   HCT 36.1 07/06/2021   PLT 282 07/06/2021   GLUCOSE 77 07/19/2021   CHOL 175 04/21/2022   TRIG 41.0 04/21/2022   HDL 105.00 04/21/2022   LDLCALC 62 04/21/2022   ALT 14 03/25/2021   AST 21 03/25/2021   NA 141 07/19/2021   K 3.7 07/19/2021   CL 104 07/19/2021   CREATININE 0.83 07/19/2021   BUN 17 07/19/2021   CO2 27 07/19/2021   TSH 1.78 02/24/2020   INR 1.1 (H) 11/26/2018    No results found.  Assessment & Plan:    Problem List Items Addressed This Visit     Bloating symptom    Occurring Post prandially following nearly every meal despite treatment of GERD and use of enzymes.  Adding probiotics.  Checking CMET.  Gastric emptying study needed.  Pyloric stenosis and linnitus plastica unlikely given normal EGD 3 monts ago       Relevant Orders   DG Abd 1 View   NM Gastric Emptying   Other Visit Diagnoses     Generalized abdominal pain    -  Primary   Relevant Orders   Comprehensive metabolic panel  Abdominal pain, LLQ       Relevant Orders   DG Abd 1 View       I spent a total of  34 minutes with this patient in a face to face visit on the date of this encounter reviewing the last office visit with margaret,  patient's diet and eating habits,   most recent imaging study ,   and post visit ordering of testing and therapeutics.    Follow-up: No follow-ups on file.   Crecencio Mc, MD

## 2022-05-02 NOTE — Patient Instructions (Signed)
CONTINUE YOUR CURRENT MEDICATIONS AND SUPPLEMENTS (INCLUDING A DAILY PROBIOTIC)  MYLANTA GAS MAY HELP IF THE PHAZYME IS NOT HELPING   PLEASE START A FOOD DIARY TO DETERMINE IF CERTAIN FOODS ARE CAUSATIVE    A FUNCTIONAL STUDY HAS BEEN ORDERED (GASTRIC EMPTYING )

## 2022-05-06 ENCOUNTER — Encounter
Admission: RE | Admit: 2022-05-06 | Discharge: 2022-05-06 | Disposition: A | Discharge status: Home / Self Care | Payer: Medicare PPO | Source: Ambulatory Visit | Attending: Internal Medicine | Admitting: Internal Medicine

## 2022-05-06 DIAGNOSIS — R140 Abdominal distension (gaseous): Secondary | ICD-10-CM | POA: Diagnosis not present

## 2022-05-06 DIAGNOSIS — K30 Functional dyspepsia: Secondary | ICD-10-CM | POA: Diagnosis not present

## 2022-05-06 MED ORDER — TECHNETIUM TC 99M SULFUR COLLOID
2.0900 | Freq: Once | INTRAVENOUS | Status: AC
  Administered 2022-05-06: 2.09 via ORAL

## 2022-05-13 ENCOUNTER — Ambulatory Visit
Admission: RE | Admit: 2022-05-13 | Discharge: 2022-05-13 | Disposition: A | Discharge status: Home / Self Care | Payer: Medicare PPO | Source: Ambulatory Visit | Attending: Family | Admitting: Family

## 2022-05-13 DIAGNOSIS — M810 Age-related osteoporosis without current pathological fracture: Secondary | ICD-10-CM | POA: Diagnosis not present

## 2022-05-13 DIAGNOSIS — Z1231 Encounter for screening mammogram for malignant neoplasm of breast: Secondary | ICD-10-CM | POA: Insufficient documentation

## 2022-05-13 DIAGNOSIS — Z780 Asymptomatic menopausal state: Secondary | ICD-10-CM | POA: Diagnosis not present

## 2022-05-30 ENCOUNTER — Telehealth: Payer: Self-pay | Admitting: Family

## 2022-05-30 DIAGNOSIS — M13872 Other specified arthritis, left ankle and foot: Secondary | ICD-10-CM | POA: Diagnosis not present

## 2022-05-30 NOTE — Telephone Encounter (Signed)
Call patient Please schedule preop visit for right total knee replacement.  She will need EKG, labs

## 2022-05-31 NOTE — Telephone Encounter (Signed)
LVM to call back to  schedule labs and EKG for Preop visit.

## 2022-06-02 NOTE — Telephone Encounter (Signed)
LVM to call back to  schedule labs and EKG for Preop visit.

## 2022-06-07 NOTE — Telephone Encounter (Signed)
LVM to call back too schedule labs and EKG for preop. Mailed letter today. 06/07/22

## 2022-06-14 ENCOUNTER — Other Ambulatory Visit: Payer: Self-pay | Admitting: Family

## 2022-06-14 DIAGNOSIS — M533 Sacrococcygeal disorders, not elsewhere classified: Secondary | ICD-10-CM | POA: Diagnosis not present

## 2022-06-14 DIAGNOSIS — E785 Hyperlipidemia, unspecified: Secondary | ICD-10-CM

## 2022-06-15 ENCOUNTER — Other Ambulatory Visit: Payer: Self-pay | Admitting: Family

## 2022-06-17 DIAGNOSIS — M461 Sacroiliitis, not elsewhere classified: Secondary | ICD-10-CM | POA: Diagnosis not present

## 2022-06-27 ENCOUNTER — Other Ambulatory Visit: Payer: Self-pay | Admitting: Family

## 2022-06-27 ENCOUNTER — Encounter: Payer: Self-pay | Admitting: Family

## 2022-06-27 DIAGNOSIS — K219 Gastro-esophageal reflux disease without esophagitis: Secondary | ICD-10-CM

## 2022-07-01 ENCOUNTER — Other Ambulatory Visit: Payer: Self-pay | Admitting: Family

## 2022-07-01 NOTE — Telephone Encounter (Signed)
Rx sent pt aware 

## 2022-07-04 DIAGNOSIS — Z0100 Encounter for examination of eyes and vision without abnormal findings: Secondary | ICD-10-CM | POA: Diagnosis not present

## 2022-07-04 DIAGNOSIS — H353131 Nonexudative age-related macular degeneration, bilateral, early dry stage: Secondary | ICD-10-CM | POA: Diagnosis not present

## 2022-07-05 ENCOUNTER — Encounter: Payer: Self-pay | Admitting: Family

## 2022-07-05 ENCOUNTER — Other Ambulatory Visit: Payer: Self-pay | Admitting: Family

## 2022-07-05 DIAGNOSIS — F325 Major depressive disorder, single episode, in full remission: Secondary | ICD-10-CM

## 2022-07-05 MED ORDER — FLUVOXAMINE MALEATE 100 MG PO TABS: 100.0000 mg | ORAL_TABLET | Freq: Every day | ORAL | 1 refills | Status: DC

## 2022-07-21 DIAGNOSIS — M21621 Bunionette of right foot: Secondary | ICD-10-CM | POA: Diagnosis not present

## 2022-08-04 DIAGNOSIS — M21621 Bunionette of right foot: Secondary | ICD-10-CM | POA: Diagnosis not present

## 2022-08-04 DIAGNOSIS — M2012 Hallux valgus (acquired), left foot: Secondary | ICD-10-CM | POA: Diagnosis not present

## 2022-08-12 ENCOUNTER — Other Ambulatory Visit: Payer: Self-pay | Admitting: Family

## 2022-08-12 DIAGNOSIS — M5137 Other intervertebral disc degeneration, lumbosacral region: Secondary | ICD-10-CM

## 2022-08-15 ENCOUNTER — Encounter (INDEPENDENT_AMBULATORY_CARE_PROVIDER_SITE_OTHER): Payer: Self-pay

## 2022-08-17 ENCOUNTER — Encounter: Payer: Self-pay | Admitting: Family

## 2022-08-17 ENCOUNTER — Ambulatory Visit: Payer: Medicare PPO | Admitting: Family

## 2022-08-17 VITALS — BP 126/70 | HR 77 | Temp 98.2°F | Ht 60.0 in | Wt 149.3 lb

## 2022-08-17 DIAGNOSIS — M549 Dorsalgia, unspecified: Secondary | ICD-10-CM

## 2022-08-17 DIAGNOSIS — K219 Gastro-esophageal reflux disease without esophagitis: Secondary | ICD-10-CM | POA: Diagnosis not present

## 2022-08-17 LAB — COMPREHENSIVE METABOLIC PANEL
ALT: 20 U/L (ref 0–35)
AST: 22 U/L (ref 0–37)
Albumin: 4.1 g/dL (ref 3.5–5.2)
Alkaline Phosphatase: 50 U/L (ref 39–117)
BUN: 21 mg/dL (ref 6–23)
CO2: 31 mEq/L (ref 19–32)
Calcium: 9 mg/dL (ref 8.4–10.5)
Chloride: 103 mEq/L (ref 96–112)
Creatinine, Ser: 0.79 mg/dL (ref 0.40–1.20)
GFR: 70.74 mL/min (ref >60.00)
Glucose, Bld: 83 mg/dL (ref 70–99)
Potassium: 4.2 mEq/L (ref 3.5–5.1)
Sodium: 139 mEq/L (ref 135–145)
Total Bilirubin: 0.6 mg/dL (ref 0.2–1.2)
Total Protein: 6.3 g/dL (ref 6.0–8.3)

## 2022-08-17 LAB — CBC WITH DIFFERENTIAL/PLATELET
Basophils Absolute: 0.1 10*3/uL (ref 0.0–0.1)
Basophils Relative: 0.9 % (ref 0.0–3.0)
Eosinophils Absolute: 0.2 10*3/uL (ref 0.0–0.7)
Eosinophils Relative: 1.8 % (ref 0.0–5.0)
HCT: 34.8 % — ABNORMAL LOW (ref 36.0–46.0)
Hemoglobin: 11.9 g/dL — ABNORMAL LOW (ref 12.0–15.0)
Lymphocytes Relative: 17.3 % (ref 12.0–46.0)
Lymphs Abs: 1.6 10*3/uL (ref 0.7–4.0)
MCHC: 34 g/dL (ref 30.0–36.0)
MCV: 99.9 fl (ref 78.0–100.0)
Monocytes Absolute: 0.8 10*3/uL (ref 0.1–1.0)
Monocytes Relative: 9.1 % (ref 3.0–12.0)
Neutro Abs: 6.4 10*3/uL (ref 1.4–7.7)
Neutrophils Relative %: 70.9 % (ref 43.0–77.0)
Platelets: 231 10*3/uL (ref 150.0–400.0)
RBC: 3.48 Mil/uL — ABNORMAL LOW (ref 3.87–5.11)
RDW: 13.3 % (ref 11.5–15.5)
WBC: 9 10*3/uL (ref 4.0–10.5)

## 2022-08-17 LAB — HEMOGLOBIN A1C: Hgb A1c MFr Bld: 5.7 % (ref 4.6–6.5)

## 2022-08-17 NOTE — Assessment & Plan Note (Addendum)
Presentation most consistent with GERD.  She will continue Protonix 40 mg every morning and start Pepcid AC every afternoon.  Of note we did discuss possible cholelithiasis. Hepato W/ef Ejection fraction of gallbladder was normal on 06/2020.  History of punctuate stone in the gallbladder seen on 01/04/2020.  Consider repeat right upper quadrant ultrasound if symptoms persist.  She will let me know how she is doing

## 2022-08-17 NOTE — Patient Instructions (Addendum)
Please confirm the dose of Luvox that you are on.  I placed a referral to physical therapy through [facility]  Please continue Protonix 40 mg prior to breakfast.  Please start Pepcid AC over-the-counter in the evening.  Please keep food journal and log of nausea.

## 2022-08-17 NOTE — Progress Notes (Signed)
Subjective:    Patient ID: Claudia Andersen, female    DOB: Jul 20, 1941, 81 y.o.   MRN: [id number]  CC: Claudia Andersen is a 81 y.o. female who presents today for an acute visit.    HPI: Complains of nausea , abrupt onset, x 3 weeks, comes and goes.   Wakes up with nausea. Goes away on its own. Doesn't get better or worse with eating.  No vomiting, fever, weight loss,  Belching improves.  1 of episodes loose brown stool History of GERD and she is compliant with Protonix 40 mg qam.    Hepato W/ef Ejection fraction of gallbladder was normal on 06/2020.  History of punctuate stone in the gallbladder seen on 01/04/2020 History of abdominal hysterectomy, appendectomy  History of osteoporosis  Colonoscopy 12/18/2020 with 1 polyp.  Advised not to repeat.  EGD at that time showed gastritis which was concordant with pathology, negative h pylori  1 very small glass of wine /night.   No nsaid, caffeine.   Complains of soft tissue stiffness mid back pain. She is interested in pt No numbness No low back pain.  She feels baclofen and gabapentin are very helpful.  Compliant with luvox although she is not sure if taking 8m or 1061m   HISTORY:  Past Medical History:  Diagnosis Date   Ankle fracture, bimalleolar, closed, right, initial encounter 10/26/2018   d/t fall 10/26/18   Arthritis    generalized   Back abrasion    CRUSHED VERTEBRAE   Cataract    bilateral sx    Closed fracture of shaft of fibula 05/11/2020   Closed right radial fracture 10/26/2018   s/p a fall   Compression of lumbar vertebra (HCC)    L1 and L4 (from fall November 05, 2018)   Degenerative disc disease, lumbar    Depression    hx of-on meds   Gallstones    GERD (gastroesophageal reflux disease)    on meds   Migraines    Osteoporosis    on Prolia inj   Pulmonary emboli (HCC)    history of emboli   Past Surgical History:  Procedure Laterality Date   ABDOMINAL HYSTERECTOMY     APPENDECTOMY     CATARACT  EXTRACTION W/PHACO Right 03/27/2018   Procedure: CATARACT EXTRACTION PHACO AND INTRAOCULAR LENS PLACEMENT ([facility]  Surgeon: PoBirder RobsonMD;  Location: [facility];  Service: Ophthalmology;  Laterality: Right;  [place]33 AP% 15.3 CDE 5.18 Fluid pack lot # [id number]   CATARACT EXTRACTION W/PHACO Left 06/26/2018   Procedure: CATARACT EXTRACTION PHACO AND INTRAOCULAR LENS PLACEMENT ([place]  Surgeon: PoBirder RobsonMD;  Location: [facility];  Service: Ophthalmology;  Laterality: Left;  [place]:38 AP% 15.2 CDE 5.80 Fluid pack lot # [id number]   FRACTURE SURGERY Right    RIGHT RADIAL wrist   METATARSAL HEAD EXCISION Right 04/01/2021   Procedure: METATARSAL HEAD EXCISION- Tailors Exostectomy -Right;  Surgeon: BaCaroline MoreDPM;  [facility] Service: Podiatry;  Laterality: Right;  CANNOT arrive before 9:30 Anesthesia- Mac + local   TONSILLECTOMY AND ADENOIDECTOMY     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   Procedure: TOTAL HIP ARTHROPLASTY ANTERIOR APPROACH;  Surgeon: BoLovell SheehanMD;  Location: [facility];  Service: Orthopedics;  Laterality: Right;   TOTAL KNEE ARTHROPLASTY Left    TRIPLE SUBTALAR FUSION     WISDOM TOOTH EXTRACTION     Family History  Problem Relation Age of Onset   Arthritis Mother  Stroke Mother    Hemachromatosis Mother    Heart disease Father    Pneumonia Maternal Grandmother    Heart disease Maternal Grandfather    Heart disease Paternal Grandmother    Heart disease Paternal Grandfather    Breast cancer Neg Hx    Colon cancer Neg Hx    Colon polyps Neg Hx    Esophageal cancer Neg Hx    Rectal cancer Neg Hx    Stomach cancer Neg Hx     Allergies: Patient has no known allergies. Current Outpatient Medications on File Prior to Visit  Medication Sig Dispense Refill   atorvastatin (LIPITOR) 10 MG tablet TAKE 1 TABLET BY MOUTH DAILY 90 tablet 3   baclofen (LIORESAL) 10 MG tablet TAKE 1 TABLET BY MOUTH 3 TIMES DAILY AS NEEDED FOR MUSCLE SPASMS.  90 each 3   Biotin 2500 MCG CAPS Take 2,500 mcg by mouth daily.     Clotrimazole 1 % OINT Apply 1 application topically 2 (two) times daily. 56.7 g 1   denosumab (PROLIA) 60 MG/ML SOSY injection Inject 60 mg into the skin every 6 (six) months.     diclofenac Sodium (VOLTAREN) 1 % GEL Apply 4 g topically 4 (four) times daily. (Patient taking differently: Apply 4 g topically 4 (four) times daily as needed.) 100 g 1   fluvoxaMINE (LUVOX) 100 MG tablet Take 1 tablet (100 mg total) by mouth daily. 90 tablet 1   gabapentin (NEURONTIN) 100 MG capsule Take 2 capsules (200 mg total) by mouth daily. 360 capsule 1   gabapentin (NEURONTIN) 300 MG capsule TAKE 1 CAPSULE BY MOUTH NIGHTLY 90 capsule 1   hydrochlorothiazide (MICROZIDE) 12.5 MG capsule Take 1 capsule (12.5 mg total) by mouth daily as needed. 30 capsule 3   mupirocin ointment (BACTROBAN) 2 % Apply 1 application topically 2 (two) times daily. 22 g 1   pantoprazole (PROTONIX) 40 MG tablet TAKE ONE TABLET (40 MG) BY MOUTH EVERY DAY 1 HOUR BEFORE BREAKFAST 90 tablet 1   PREVIDENT 5000 PLUS 1.1 % CREA dental cream Take by mouth in the morning and at bedtime.     traMADol (ULTRAM) 50 MG tablet tramadol 50 mg tablet  TAKE ONE TABLET BY MOUTH EVERY 6 HOURS AS NEEDED     traZODone (DESYREL) 50 MG tablet TAKE 2 TABLETS BY MOUTH AT BEDTIME AS NEEDED FOR SLEEP 30 tablet 3   triamcinolone (KENALOG) 0.025 % cream Apply 1 application topically 2 (two) times daily. 15 g 1   valACYclovir (VALTREX) 1000 MG tablet TAKE TWO TABLETS BY MOUTH EVERY 12 HOURSFOR 1 DAY. **START AS SOON AS POSSIBLE AFTER SYMPTOM ONSET** 6 tablet 1   Fluticasone Furoate (FLONASE SENSIMIST NA) Place 1 spray into both nostrils daily as needed (allergies).  (Patient not taking: Reported on 08/17/2022)     No current facility-administered medications on file prior to visit.    Social History   Tobacco Use   Smoking status: Former    Packs/day: 0.50    Years: 22.00    Total pack years:  11.00    Types: Cigarettes    Quit date: 1982    Years since quitting: 41.8   Smokeless tobacco: Never  Vaping Use   Vaping Use: Never used  Substance Use Topics   Alcohol use: Yes    Alcohol/week: 10.0 standard drinks of alcohol    Types: 10 Standard drinks or equivalent per week   Drug use: Never    Review of Systems  Constitutional:  Negative for chills and fever.  Respiratory:  Negative for cough.   Cardiovascular:  Negative for chest pain and palpitations.  Gastrointestinal:  Positive for nausea. Negative for abdominal pain, constipation, diarrhea and vomiting.  Musculoskeletal:  Positive for back pain.      Objective:    BP 126/70 (BP Location: Left Arm, Patient Position: Sitting, Cuff Size: Normal)   Pulse 77   Temp 98.2 F (36.8 C) (Oral)   Ht 5' (1.524 m)   Wt 149 lb 4.8 oz (67.7 kg)   SpO2 98%   BMI 29.16 kg/m   Wt Readings from Last 3 Encounters:  08/17/22 149 lb 4.8 oz (67.7 kg)  05/02/22 142 lb (64.4 kg)  03/29/22 139 lb 6.4 oz (63.2 kg)      08/17/2022   11:42 AM 03/29/2022   10:48 AM 02/09/2022   10:42 AM  Depression screen PHQ 2/9  Decreased Interest 1 0 0  Down, Depressed, Hopeless 1 0 0  PHQ - 2 Score 2 0 0  Altered sleeping 0  0  Tired, decreased energy 0  0  Change in appetite 1  0  Feeling bad or failure about yourself  0  0  Trouble concentrating 1  0  Moving slowly or fidgety/restless 0  0  Suicidal thoughts 0  0  PHQ-9 Score 4  0  Difficult doing work/chores Somewhat difficult Not difficult at all Not difficult at all    Physical Exam Vitals reviewed.  Constitutional:      Appearance: Normal appearance. She is well-developed.  Eyes:     Conjunctiva/sclera: Conjunctivae normal.  Cardiovascular:     Rate and Rhythm: Normal rate and regular rhythm.     Pulses: Normal pulses.     Heart sounds: Normal heart sounds.  Pulmonary:     Effort: Pulmonary effort is normal.     Breath sounds: Normal breath sounds. No wheezing, rhonchi or  rales.  Abdominal:     General: Bowel sounds are normal. There is no distension.     Palpations: Abdomen is soft. Abdomen is not rigid. There is no fluid wave or mass.     Tenderness: There is no abdominal tenderness. There is no guarding or rebound.  Musculoskeletal:     Left shoulder: No swelling or bony tenderness. Normal range of motion.       Arms:     Thoracic back: Spasms present. No swelling, edema, deformity or signs of trauma. Normal range of motion.  Skin:    General: Skin is warm and dry.  Neurological:     Mental Status: She is alert.  Psychiatric:        Speech: Speech normal.        Behavior: Behavior normal.        Thought Content: Thought content normal.        Assessment & Plan:   Problem List Items Addressed This Visit       Digestive   GERD (gastroesophageal reflux disease)    Presentation most consistent with GERD.  She will continue Protonix 40 mg every morning and start Pepcid AC every afternoon.  Of note we did discuss possible cholelithiasis. Hepato W/ef Ejection fraction of gallbladder was normal on 06/2020.  History of punctuate stone in the gallbladder seen on 01/04/2020.  Consider repeat right upper quadrant ultrasound if symptoms persist.  She will let me know how she is doing      Relevant Orders   CBC with Differential/Platelet   Comprehensive metabolic  panel   Hemoglobin A1c     Other   Mid back pain - Primary    Chronic.  Presentation consistent with muscle spasm.  She does have history of compression fracture however she politely declines repeat imaging including x-ray today.  She prefers to start physical therapy which I have placed      Relevant Orders   Ambulatory referral to Physical Therapy      I am having [name] "Sandy" maintain her Fluticasone Furoate (FLONASE SENSIMIST NA), Biotin, diclofenac Sodium, Prolia, PreviDent 5000 Plus, triamcinolone, mupirocin ointment, Clotrimazole, traMADol, baclofen, valACYclovir,  gabapentin, hydrochlorothiazide, traZODone, atorvastatin, gabapentin, pantoprazole, and fluvoxaMINE.   No orders of the defined types were placed in this encounter.   Return precautions given.   Risks, benefits, and alternatives of the medications and treatment plan prescribed today were discussed, and patient expressed understanding.   Education regarding symptom management and diagnosis given to patient on AVS.  Continue to follow with Burnard Hawthorne, FNP for routine health maintenance.   [name] and I agreed with plan.   Mable Paris, FNP

## 2022-08-17 NOTE — Assessment & Plan Note (Signed)
Chronic.  Presentation consistent with muscle spasm.  She does have history of compression fracture however she politely declines repeat imaging including x-ray today.  She prefers to start physical therapy which I have placed

## 2022-08-18 ENCOUNTER — Other Ambulatory Visit (INDEPENDENT_AMBULATORY_CARE_PROVIDER_SITE_OTHER): Payer: Medicare PPO

## 2022-08-18 ENCOUNTER — Telehealth: Payer: Self-pay | Admitting: Family

## 2022-08-18 DIAGNOSIS — D649 Anemia, unspecified: Secondary | ICD-10-CM

## 2022-08-18 LAB — IBC + FERRITIN
Ferritin: 24.3 ng/mL (ref 10.0–291.0)
Iron: 95 ug/dL (ref 42–145)
Saturation Ratios: 32.8 % (ref 20.0–50.0)
TIBC: 289.8 ug/dL (ref 250.0–450.0)
Transferrin: 207 mg/dL — ABNORMAL LOW (ref 212.0–360.0)

## 2022-08-18 LAB — B12 AND FOLATE PANEL
Folate: 11.9 ng/mL (ref >5.9)
Vitamin B-12: >1500 pg/mL — ABNORMAL HIGH (ref 211–911)

## 2022-08-18 NOTE — Telephone Encounter (Signed)
Can we add on ferritin study, B12 labs from yesterday ?  I ordered

## 2022-08-21 ENCOUNTER — Encounter: Payer: Self-pay | Admitting: Family

## 2022-08-22 ENCOUNTER — Encounter: Payer: Self-pay | Admitting: Family

## 2022-08-23 ENCOUNTER — Encounter: Payer: Self-pay | Admitting: Family

## 2022-08-23 DIAGNOSIS — M549 Dorsalgia, unspecified: Secondary | ICD-10-CM | POA: Diagnosis not present

## 2022-08-24 ENCOUNTER — Encounter: Payer: Self-pay | Admitting: Family

## 2022-08-25 DIAGNOSIS — M549 Dorsalgia, unspecified: Secondary | ICD-10-CM | POA: Diagnosis not present

## 2022-08-29 DIAGNOSIS — M2041 Other hammer toe(s) (acquired), right foot: Secondary | ICD-10-CM | POA: Diagnosis not present

## 2022-08-29 DIAGNOSIS — M2042 Other hammer toe(s) (acquired), left foot: Secondary | ICD-10-CM | POA: Diagnosis not present

## 2022-08-29 DIAGNOSIS — M19072 Primary osteoarthritis, left ankle and foot: Secondary | ICD-10-CM | POA: Diagnosis not present

## 2022-08-29 DIAGNOSIS — M205X1 Other deformities of toe(s) (acquired), right foot: Secondary | ICD-10-CM | POA: Diagnosis not present

## 2022-08-29 DIAGNOSIS — M2040 Other hammer toe(s) (acquired), unspecified foot: Secondary | ICD-10-CM | POA: Insufficient documentation

## 2022-08-29 DIAGNOSIS — M21621 Bunionette of right foot: Secondary | ICD-10-CM | POA: Diagnosis not present

## 2022-08-29 DIAGNOSIS — M19071 Primary osteoarthritis, right ankle and foot: Secondary | ICD-10-CM | POA: Diagnosis not present

## 2022-08-29 DIAGNOSIS — M2030 Hallux varus (acquired), unspecified foot: Secondary | ICD-10-CM | POA: Insufficient documentation

## 2022-08-29 DIAGNOSIS — M2060 Acquired deformities of toe(s), unspecified, unspecified foot: Secondary | ICD-10-CM | POA: Insufficient documentation

## 2022-08-29 DIAGNOSIS — M2032 Hallux varus (acquired), left foot: Secondary | ICD-10-CM | POA: Diagnosis not present

## 2022-08-30 DIAGNOSIS — M549 Dorsalgia, unspecified: Secondary | ICD-10-CM | POA: Diagnosis not present

## 2022-08-31 ENCOUNTER — Telehealth: Payer: Medicare PPO | Admitting: Family

## 2022-09-01 DIAGNOSIS — M549 Dorsalgia, unspecified: Secondary | ICD-10-CM | POA: Diagnosis not present

## 2022-09-02 ENCOUNTER — Telehealth: Payer: Self-pay

## 2022-09-02 NOTE — Telephone Encounter (Signed)
LVM to call back to confirm that she is getting the total knee replacement surgery? If so she needs an appt for Surgical clearance?

## 2022-09-06 DIAGNOSIS — M549 Dorsalgia, unspecified: Secondary | ICD-10-CM | POA: Diagnosis not present

## 2022-09-16 ENCOUNTER — Encounter: Payer: Self-pay | Admitting: Family

## 2022-09-16 ENCOUNTER — Ambulatory Visit (INDEPENDENT_AMBULATORY_CARE_PROVIDER_SITE_OTHER): Payer: Medicare PPO | Admitting: Family

## 2022-09-16 VITALS — BP 124/76 | HR 71 | Temp 97.6°F | Wt 146.2 lb

## 2022-09-16 DIAGNOSIS — M545 Low back pain, unspecified: Secondary | ICD-10-CM | POA: Diagnosis not present

## 2022-09-16 DIAGNOSIS — G4700 Insomnia, unspecified: Secondary | ICD-10-CM | POA: Diagnosis not present

## 2022-09-16 DIAGNOSIS — E785 Hyperlipidemia, unspecified: Secondary | ICD-10-CM | POA: Diagnosis not present

## 2022-09-16 DIAGNOSIS — M549 Dorsalgia, unspecified: Secondary | ICD-10-CM | POA: Diagnosis not present

## 2022-09-16 DIAGNOSIS — D649 Anemia, unspecified: Secondary | ICD-10-CM

## 2022-09-16 DIAGNOSIS — G8929 Other chronic pain: Secondary | ICD-10-CM

## 2022-09-16 DIAGNOSIS — F32A Depression, unspecified: Secondary | ICD-10-CM | POA: Diagnosis not present

## 2022-09-16 MED ORDER — TRAZODONE HCL 50 MG PO TABS: 100.0000 mg | ORAL_TABLET | Freq: Every day | ORAL | 3 refills | Status: DC

## 2022-09-16 MED ORDER — ATORVASTATIN CALCIUM 10 MG PO TABS: 10.0000 mg | ORAL_TABLET | Freq: Every day | ORAL | 3 refills | Status: DC

## 2022-09-16 NOTE — Progress Notes (Signed)
Subjective:    Patient ID: Claudia Andersen, female    DOB: 22-Feb-1941, 81 y.o.   MRN: [id number]  CC: Claudia Andersen is a 81 y.o. female who presents today for follow up.   HPI: She complains of not sleeping well. She has been off of trazodone unintentionally due to lapse in medication prescription.  She requests refill today.   Depression-compliant with Luvox 100 mg daily.  Denies depression and feels very well on Luvox.   Chronic back pain - compliant with gabapentin 250m qam, 2083mmidday ( she will take prn)  and 30013mpm. For as needed, she uses baclofen 10 mg 3 times daily as needed, over-the-counter lidocaine patches prn,  tramadol 50 mg daily prn. She uses tramadol rarely. Regimen is working well.  She does not find regimen particularly sedating  Mammogram is up-to-date Former smoker , quit 15+ years ago    HISTORY:  Past Medical History:  Diagnosis Date   Ankle fracture, bimalleolar, closed, right, initial encounter 10/26/2018   d/t fall 10/26/18   Arthritis    generalized   Back abrasion    CRUSHED VERTEBRAE   Cataract    bilateral sx    Closed fracture of shaft of fibula 05/11/2020   Closed right radial fracture 10/26/2018   s/p a fall   Compression of lumbar vertebra (HCC)    L1 and L4 (from fall November 05, 2018)   Degenerative disc disease, lumbar    Depression    hx of-on meds   Gallstones    GERD (gastroesophageal reflux disease)    on meds   Migraines    Osteoporosis    on Prolia inj   Pulmonary emboli (HCC)    history of emboli   Past Surgical History:  Procedure Laterality Date   ABDOMINAL HYSTERECTOMY     APPENDECTOMY     CATARACT EXTRACTION W/PHACO Right 03/27/2018   Procedure: CATARACT EXTRACTION PHACO AND INTRAOCULAR LENS PLACEMENT ([place] Surgeon: PorBirder RobsonD;  Location: [facility];  Service: Ophthalmology;  Laterality: Right;  [place]:33 AP% 15.3 CDE 5.18 Fluid pack lot # [id number]  CATARACT EXTRACTION W/PHACO Left 06/26/2018    Procedure: CATARACT EXTRACTION PHACO AND INTRAOCULAR LENS PLACEMENT ([place] Surgeon: PorBirder RobsonD;  Location: [facility];  Service: Ophthalmology;  Laterality: Left;  [place]:38 AP% 15.2 CDE 5.80 Fluid pack lot # [id number]  FRACTURE SURGERY Right    RIGHT RADIAL wrist   METATARSAL HEAD EXCISION Right 04/01/2021   Procedure: METATARSAL HEAD EXCISION- Tailors Exostectomy -Right;  Surgeon: BakCaroline MorePM;  [facility];  Laterality: Right;  CANNOT arrive before 9:30 Anesthesia- Mac + local   TONSILLECTOMY AND ADENOIDECTOMY     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   Procedure: TOTAL HIP ARTHROPLASTY ANTERIOR APPROACH;  Surgeon: BowLovell SheehanD;  Location: [facility];  Service: Orthopedics;  Laterality: Right;   TOTAL KNEE ARTHROPLASTY Left    TRIPLE SUBTALAR FUSION     WISDOM TOOTH EXTRACTION     Family History  Problem Relation Age of Onset   Arthritis Mother    Stroke Mother    Hemachromatosis Mother    Heart disease Father    Pneumonia Maternal Grandmother    Heart disease Maternal Grandfather    Heart disease Paternal Grandmother    Heart disease Paternal Grandfather    Breast cancer Neg Hx    Colon cancer Neg Hx    Colon polyps Neg Hx  Esophageal cancer Neg Hx    Rectal cancer Neg Hx    Stomach cancer Neg Hx     Allergies: Patient has no known allergies. Current Outpatient Medications on File Prior to Visit  Medication Sig Dispense Refill   baclofen (LIORESAL) 10 MG tablet TAKE 1 TABLET BY MOUTH 3 TIMES DAILY AS NEEDED FOR MUSCLE SPASMS. 90 each 3   Biotin 2500 MCG CAPS Take 2,500 mcg by mouth daily.     Clotrimazole 1 % OINT Apply 1 application topically 2 (two) times daily. 56.7 g 1   denosumab (PROLIA) 60 MG/ML SOSY injection Inject 60 mg into the skin every 6 (six) months.     diclofenac Sodium (VOLTAREN) 1 % GEL Apply 4 g topically 4 (four) times daily. (Patient taking differently: Apply 4 g topically 4 (four) times  daily as needed.) 100 g 1   Fluticasone Furoate (FLONASE SENSIMIST NA) Place 1 spray into both nostrils daily as needed (allergies).     fluvoxaMINE (LUVOX) 100 MG tablet Take 1 tablet (100 mg total) by mouth daily. 90 tablet 1   gabapentin (NEURONTIN) 100 MG capsule Take 2 capsules (200 mg total) by mouth daily. 360 capsule 1   gabapentin (NEURONTIN) 300 MG capsule TAKE 1 CAPSULE BY MOUTH NIGHTLY 90 capsule 1   hydrochlorothiazide (MICROZIDE) 12.5 MG capsule Take 1 capsule (12.5 mg total) by mouth daily as needed. 30 capsule 3   Lactobacillus (PROBIOTIC ACIDOPHILUS PO) Take 1 tablet by mouth daily.     mupirocin ointment (BACTROBAN) 2 % Apply 1 application topically 2 (two) times daily. 22 g 1   pantoprazole (PROTONIX) 40 MG tablet TAKE ONE TABLET (40 MG) BY MOUTH EVERY DAY 1 HOUR BEFORE BREAKFAST 90 tablet 1   PREVIDENT 5000 PLUS 1.1 % CREA dental cream Take by mouth in the morning and at bedtime.     traMADol (ULTRAM) 50 MG tablet tramadol 50 mg tablet  TAKE ONE TABLET BY MOUTH EVERY 6 HOURS AS NEEDED     triamcinolone (KENALOG) 0.025 % cream Apply 1 application topically 2 (two) times daily. 15 g 1   valACYclovir (VALTREX) 1000 MG tablet TAKE TWO TABLETS BY MOUTH EVERY 12 HOURSFOR 1 DAY. **START AS SOON AS POSSIBLE AFTER SYMPTOM ONSET** 6 tablet 1   No current facility-administered medications on file prior to visit.    Social History   Tobacco Use   Smoking status: Former    Packs/day: 0.50    Years: 22.00    Total pack years: 11.00    Types: Cigarettes    Quit date: 1982    Years since quitting: 41.9   Smokeless tobacco: Never  Vaping Use   Vaping Use: Never used  Substance Use Topics   Alcohol use: Yes    Alcohol/week: 10.0 standard drinks of alcohol    Types: 10 Standard drinks or equivalent per week   Drug use: Never    Review of Systems  Constitutional:  Negative for chills and fever.  Respiratory:  Negative for cough.   Cardiovascular:  Negative for chest pain and  palpitations.  Gastrointestinal:  Negative for nausea and vomiting.      Objective:    BP 124/76   Pulse 71   Temp 97.6 F (36.4 C) (Oral)   Wt 146 lb 3.2 oz (66.3 kg)   SpO2 99%   BMI 28.55 kg/m  BP Readings from Last 3 Encounters:  09/16/22 124/76  08/17/22 126/70  05/02/22 134/80   Wt Readings from Last 3 Encounters:  09/16/22 146 lb 3.2 oz (66.3 kg)  08/17/22 149 lb 4.8 oz (67.7 kg)  05/02/22 142 lb (64.4 kg)    Physical Exam Vitals reviewed.  Constitutional:      Appearance: She is well-developed.  Eyes:     Conjunctiva/sclera: Conjunctivae normal.  Cardiovascular:     Rate and Rhythm: Normal rate and regular rhythm.     Pulses: Normal pulses.     Heart sounds: Normal heart sounds.  Pulmonary:     Effort: Pulmonary effort is normal.     Breath sounds: Normal breath sounds. No wheezing, rhonchi or rales.  Skin:    General: Skin is warm and dry.  Neurological:     Mental Status: She is alert.  Psychiatric:        Speech: Speech normal.        Behavior: Behavior normal.        Thought Content: Thought content normal.        Assessment & Plan:   Problem List Items Addressed This Visit       Other   Depressive disorder    Chronic, stable.  Continue Luvox 100 mg qd      Relevant Medications   traZODone (DESYREL) 50 MG tablet   HLD (hyperlipidemia)   Relevant Medications   atorvastatin (LIPITOR) 10 MG tablet   Insomnia    Increased fatigue of late due to poor sleep.  Resume tramadol 50 mg for 7 days and then increase to 100 mg nightly which she has previously been on.  She will let me know if the fatigue does not completely resolve.      Low back pain    Chronic, stable. Continue gabapentin 266m qam, 2079mmidday ( she will take prn)  and 30036mpm.  For as needed, she uses baclofen 10 mg 3 times daily as needed, over-the-counter lidocaine patches prn,  tramadol 50 mg daily prn.      Normocytic anemia - Primary   Relevant Orders   CBC with  Differential/Platelet   IBC + Ferritin     I have changed SanShanda Bumpsandy"'s traZODone and atorvastatin. I am also having her maintain her Fluticasone Furoate (FLONASE SENSIMIST NA), Biotin, diclofenac Sodium, Prolia, PreviDent 5000 Plus, triamcinolone, mupirocin ointment, Clotrimazole, traMADol, baclofen, valACYclovir, gabapentin, hydrochlorothiazide, gabapentin, pantoprazole, fluvoxaMINE, and Lactobacillus (PROBIOTIC ACIDOPHILUS PO).   Meds ordered this encounter  Medications   traZODone (DESYREL) 50 MG tablet    Sig: Take 2 tablets (100 mg total) by mouth at bedtime.    Dispense:  180 tablet    Refill:  3    FOR NEXT TIME    Order Specific Question:   Supervising Provider    Answer:   TULDeborra Medina[[id number]]   atorvastatin (LIPITOR) 10 MG tablet    Sig: Take 1 tablet (10 mg total) by mouth daily.    Dispense:  90 tablet    Refill:  3    FOR NEXT FILL    Order Specific Question:   Supervising Provider    Answer:   TULCrecencio [id number]]    Return precautions given.   Risks, benefits, and alternatives of the medications and treatment plan prescribed today were discussed, and patient expressed understanding.   Education regarding symptom management and diagnosis given to patient on AVS.  Continue to follow with ArnBurnard HawthorneNP for routine health maintenance.   SanShanda Bumpsd I agreed with plan.   MarMable ParisNP

## 2022-09-16 NOTE — Assessment & Plan Note (Signed)
Chronic, stable. Continue gabapentin '200mg'$  qam, '200mg'$  midday ( she will take prn)  and '300mg'$  qpm.  For as needed, she uses baclofen 10 mg 3 times daily as needed, over-the-counter lidocaine patches prn,  tramadol 50 mg daily prn.

## 2022-09-16 NOTE — Patient Instructions (Signed)
Resume tramadol 50 mg for 7 days and then increase to 100 mg nightly which you had previously been on.  Please let me know if the fatigue does not completely resolve.

## 2022-09-16 NOTE — Assessment & Plan Note (Signed)
Increased fatigue of late due to poor sleep.  Resume tramadol 50 mg for 7 days and then increase to 100 mg nightly which she has previously been on.  She will let me know if the fatigue does not completely resolve.

## 2022-09-16 NOTE — Assessment & Plan Note (Signed)
Chronic, stable.  Continue Luvox 100 mg qd

## 2022-09-19 DIAGNOSIS — M205X1 Other deformities of toe(s) (acquired), right foot: Secondary | ICD-10-CM | POA: Diagnosis not present

## 2022-09-19 DIAGNOSIS — M2190 Unspecified acquired deformity of unspecified limb: Secondary | ICD-10-CM | POA: Insufficient documentation

## 2022-09-19 DIAGNOSIS — L84 Corns and callosities: Secondary | ICD-10-CM | POA: Diagnosis not present

## 2022-10-04 DIAGNOSIS — M205X1 Other deformities of toe(s) (acquired), right foot: Secondary | ICD-10-CM | POA: Diagnosis not present

## 2022-10-04 DIAGNOSIS — L84 Corns and callosities: Secondary | ICD-10-CM | POA: Diagnosis not present

## 2022-10-04 DIAGNOSIS — Z01818 Encounter for other preprocedural examination: Secondary | ICD-10-CM | POA: Diagnosis not present

## 2022-10-05 DIAGNOSIS — M205X1 Other deformities of toe(s) (acquired), right foot: Secondary | ICD-10-CM | POA: Diagnosis not present

## 2022-10-05 DIAGNOSIS — Z9889 Other specified postprocedural states: Secondary | ICD-10-CM | POA: Diagnosis not present

## 2022-10-05 DIAGNOSIS — L84 Corns and callosities: Secondary | ICD-10-CM | POA: Diagnosis not present

## 2022-10-14 DIAGNOSIS — M205X1 Other deformities of toe(s) (acquired), right foot: Secondary | ICD-10-CM | POA: Diagnosis not present

## 2022-10-19 ENCOUNTER — Encounter: Payer: Self-pay | Admitting: Family

## 2022-10-19 ENCOUNTER — Ambulatory Visit: Payer: Medicare PPO | Admitting: Family

## 2022-10-19 VITALS — BP 128/80 | HR 69 | Temp 97.6°F | Resp 16

## 2022-10-19 DIAGNOSIS — F325 Major depressive disorder, single episode, in full remission: Secondary | ICD-10-CM

## 2022-10-19 DIAGNOSIS — G250 Essential tremor: Secondary | ICD-10-CM | POA: Diagnosis not present

## 2022-10-19 MED ORDER — FLUVOXAMINE MALEATE 50 MG PO TABS: 50.0000 mg | ORAL_TABLET | Freq: Every day | ORAL | 1 refills | Status: DC

## 2022-10-19 MED ORDER — BUPROPION HCL ER (XL) 150 MG PO TB24: 150.0000 mg | ORAL_TABLET | Freq: Every day | ORAL | 0 refills | Status: DC

## 2022-10-19 NOTE — Assessment & Plan Note (Signed)
Wean from Wellbutrin due to tremor.  Increase Luvox 100 mg to 150 mg daily.  Continue trazodone 100 mg qhs.

## 2022-10-19 NOTE — Assessment & Plan Note (Addendum)
Presentation consistent with essential tremor.  I do suspect likely exacerbated by Wellbutrin.  Patient has been taking Wellbutrin as needed.  Advised her to wean from wellbutrin.  Of note, Luvox is associated with tremor and less than 10%.  Close follow-up

## 2022-10-19 NOTE — Progress Notes (Signed)
Assessment & Plan:  Depression, major, single episode, complete remission [facility]) Assessment & Plan: Wean from Wellbutrin due to tremor.  Increase Luvox 100 mg to 150 mg daily.  Continue trazodone 100 mg qhs.   Orders: -     fluvoxaMINE Maleate; Take 1 tablet (50 mg total) by mouth at bedtime. Take with '100mg'$  luvox daily  Dispense: 90 tablet; Refill: 1 -     buPROPion HCl ER (XL); Take 1 tablet (150 mg total) by mouth daily.  Dispense: 10 tablet; Refill: 0  Essential tremor Assessment & Plan: Presentation consistent with essential tremor.  I do suspect likely exacerbated by Wellbutrin.  Patient has been taking Wellbutrin as needed.  Advised her to wean from wellbutrin.  Of note, Luvox is associated with tremor and less than 10%.  Close follow-up  Orders: -     buPROPion HCl ER (XL); Take 1 tablet (150 mg total) by mouth daily.  Dispense: 10 tablet; Refill: 0     Return precautions given.   Risks, benefits, and alternatives of the medications and treatment plan prescribed today were discussed, and patient expressed understanding.   Education regarding symptom management and diagnosis given to patient on AVS either electronically or printed.  Return in about 6 weeks (around 11/30/2022).  Mable Paris, FNP  Subjective:    Patient ID: Claudia Andersen, Claudia Andersen    DOB: 05/04/1941, 82 y.o.   MRN: [id number]  CC: Claudia Andersen is a 82 y.o. Claudia Andersen who presents today for an acute visit.    HPI: She has noticed right hand tremor for 6-8 years, unchanged. She describes that it is only seen with action such as holding a sheet or paper, a cup.  It can wax and wane.  She has noticed wellbutrin makes worse and on days that she does not take Wellbutrin, tremor is nearly resolved.  She does not drink caffeine.  It is not particularly worse with fatigue, anxiety.  No HA, vision changes        S/p foot Surgery Dr Veins 12/20 for right foot.   Compliant with luvox '100mg'$ , trazodone '100mg'$   qhs.    Allergies: Patient has no known allergies. Current Outpatient Medications on File Prior to Visit  Medication Sig Dispense Refill   atorvastatin (LIPITOR) 10 MG tablet Take 1 tablet (10 mg total) by mouth daily. 90 tablet 3   baclofen (LIORESAL) 10 MG tablet TAKE 1 TABLET BY MOUTH 3 TIMES DAILY AS NEEDED FOR MUSCLE SPASMS. 90 each 3   Biotin 2500 MCG CAPS Take 2,500 mcg by mouth daily.     Clotrimazole 1 % OINT Apply 1 application topically 2 (two) times daily. 56.7 g 1   denosumab (PROLIA) 60 MG/ML SOSY injection Inject 60 mg into the skin every 6 (six) months.     diclofenac Sodium (VOLTAREN) 1 % GEL Apply 4 g topically 4 (four) times daily. (Patient taking differently: Apply 4 g topically 4 (four) times daily as needed.) 100 g 1   Fluticasone Furoate (FLONASE SENSIMIST NA) Place 1 spray into both nostrils daily as needed (allergies).     fluvoxaMINE (LUVOX) 100 MG tablet Take 1 tablet (100 mg total) by mouth daily. 90 tablet 1   gabapentin (NEURONTIN) 100 MG capsule Take 2 capsules (200 mg total) by mouth daily. 360 capsule 1   gabapentin (NEURONTIN) 300 MG capsule TAKE 1 CAPSULE BY MOUTH NIGHTLY 90 capsule 1   hydrochlorothiazide (MICROZIDE) 12.5 MG capsule Take 1 capsule (12.5 mg total) by mouth  daily as needed. 30 capsule 3   Lactobacillus (PROBIOTIC ACIDOPHILUS PO) Take 1 tablet by mouth daily.     mupirocin ointment (BACTROBAN) 2 % Apply 1 application topically 2 (two) times daily. 22 g 1   pantoprazole (PROTONIX) 40 MG tablet TAKE ONE TABLET (40 MG) BY MOUTH EVERY DAY 1 HOUR BEFORE BREAKFAST 90 tablet 1   PREVIDENT 5000 PLUS 1.1 % CREA dental cream Take by mouth in the morning and at bedtime.     traMADol (ULTRAM) 50 MG tablet tramadol 50 mg tablet  TAKE ONE TABLET BY MOUTH EVERY 6 HOURS AS NEEDED     traZODone (DESYREL) 50 MG tablet Take 2 tablets (100 mg total) by mouth at bedtime. 180 tablet 3   triamcinolone (KENALOG) 0.025 % cream Apply 1 application topically 2 (two)  times daily. 15 g 1   valACYclovir (VALTREX) 1000 MG tablet TAKE TWO TABLETS BY MOUTH EVERY 12 HOURSFOR 1 DAY. **START AS SOON AS POSSIBLE AFTER SYMPTOM ONSET** 6 tablet 1   No current facility-administered medications on file prior to visit.    Review of Systems  Constitutional:  Negative for chills and fever.  Respiratory:  Negative for cough.   Cardiovascular:  Negative for chest pain and palpitations.  Gastrointestinal:  Negative for nausea and vomiting.  Neurological:  Positive for tremors.      Objective:    BP 128/80   Pulse 69   Temp 97.6 F (36.4 C)   Resp 16   SpO2 98%   BP Readings from Last 3 Encounters:  10/19/22 128/80  09/16/22 124/76  08/17/22 126/70   Wt Readings from Last 3 Encounters:  09/16/22 146 lb 3.2 oz (66.3 kg)  08/17/22 149 lb 4.8 oz (67.7 kg)  05/02/22 142 lb (64.4 kg)    Physical Exam Vitals reviewed.  Constitutional:      Appearance: She is well-developed.  Eyes:     Conjunctiva/sclera: Conjunctivae normal.  Cardiovascular:     Rate and Rhythm: Normal rate and regular rhythm.     Pulses: Normal pulses.     Heart sounds: Normal heart sounds.  Pulmonary:     Effort: Pulmonary effort is normal.     Breath sounds: Normal breath sounds. No wheezing, rhonchi or rales.  Skin:    General: Skin is warm and dry.  Neurological:     Mental Status: She is alert.     Comments: Fine intention tremor with finger-to-nose movement bilaterally, more notable in right hand.  No cogwheeling  Psychiatric:        Speech: Speech normal.        Behavior: Behavior normal.        Thought Content: Thought content normal.

## 2022-10-19 NOTE — Patient Instructions (Addendum)
Please wean from Wellbutrin 300 mg to 150 mg. You may start 150 mg and stay on 150 mg for approximately 5 days and then stop medication completely.  Please observe if tremor improves  Increase luvox to '150mg'$ 

## 2022-10-27 DIAGNOSIS — Z09 Encounter for follow-up examination after completed treatment for conditions other than malignant neoplasm: Secondary | ICD-10-CM | POA: Diagnosis not present

## 2022-10-27 DIAGNOSIS — M205X1 Other deformities of toe(s) (acquired), right foot: Secondary | ICD-10-CM | POA: Diagnosis not present

## 2022-10-28 DIAGNOSIS — M810 Age-related osteoporosis without current pathological fracture: Secondary | ICD-10-CM | POA: Diagnosis not present

## 2022-11-02 ENCOUNTER — Other Ambulatory Visit: Payer: Self-pay | Admitting: Physician Assistant

## 2022-11-02 ENCOUNTER — Ambulatory Visit
Admission: RE | Admit: 2022-11-02 | Discharge: 2022-11-02 | Disposition: A | Discharge status: Home / Self Care | Payer: Medicare PPO | Source: Ambulatory Visit | Attending: Physician Assistant | Admitting: Physician Assistant

## 2022-11-02 DIAGNOSIS — M7989 Other specified soft tissue disorders: Secondary | ICD-10-CM | POA: Diagnosis not present

## 2022-11-02 DIAGNOSIS — R2240 Localized swelling, mass and lump, unspecified lower limb: Secondary | ICD-10-CM | POA: Diagnosis not present

## 2022-11-02 DIAGNOSIS — R2241 Localized swelling, mass and lump, right lower limb: Secondary | ICD-10-CM

## 2022-11-02 DIAGNOSIS — M1711 Unilateral primary osteoarthritis, right knee: Secondary | ICD-10-CM | POA: Diagnosis not present

## 2022-11-03 ENCOUNTER — Encounter: Payer: Self-pay | Admitting: Family

## 2022-11-07 ENCOUNTER — Telehealth: Payer: Self-pay

## 2022-11-07 NOTE — Telephone Encounter (Signed)
Patient was called at scheduled appointment time on the wrong number. Wrong number listed on the appointment note. Due to no answer, patient was called back on different number, but it was too late to conduct the visit on today. Patient okay to reschedule. Appointment cancelled off of schedule.

## 2022-11-09 ENCOUNTER — Ambulatory Visit: Payer: Medicare PPO | Admitting: Family

## 2022-11-09 ENCOUNTER — Encounter: Payer: Self-pay | Admitting: Family

## 2022-11-09 VITALS — BP 128/80 | HR 75 | Temp 98.1°F | Ht 60.0 in | Wt 145.2 lb

## 2022-11-09 DIAGNOSIS — G250 Essential tremor: Secondary | ICD-10-CM | POA: Diagnosis not present

## 2022-11-09 DIAGNOSIS — F325 Major depressive disorder, single episode, in full remission: Secondary | ICD-10-CM | POA: Diagnosis not present

## 2022-11-09 DIAGNOSIS — D649 Anemia, unspecified: Secondary | ICD-10-CM | POA: Diagnosis not present

## 2022-11-09 DIAGNOSIS — M549 Dorsalgia, unspecified: Secondary | ICD-10-CM | POA: Diagnosis not present

## 2022-11-09 MED ORDER — TRAMADOL HCL 50 MG PO TABS: 50.0000 mg | ORAL_TABLET | Freq: Two times a day (BID) | ORAL | 2 refills | Status: DC | PRN

## 2022-11-09 MED ORDER — METHOCARBAMOL 500 MG PO TABS: 500.0000 mg | ORAL_TABLET | Freq: Four times a day (QID) | ORAL | 2 refills | Status: DC

## 2022-11-09 NOTE — Assessment & Plan Note (Signed)
Chronic, stable.  She feels presentation is similar to previous pain.  Patient declines updating imaging including MRI at this time.  Discussed pain management including tramadol 50 mg twice daily as needed.  Trial stop baclofen as called in sedation.  Trial of Robaxin to see if less sedating, more effective.  Encouraged use of a tylenol arthritis. she will also start PT at [facility].  I looked up patient on [place] Controlled Substances Reporting System PMP AWARE and saw no activity that raised concern of inappropriate use.

## 2022-11-09 NOTE — Patient Instructions (Signed)
As discussed, let's start by scheduling Tylenol Arthritis which is a '650mg'$  tablet .   You may take 1-2 tablets every 8 hours ( scheduled) with maximum of 6 tablets per day. Most adults can safely take 4 pills total per day of Tylenol Arthritis '650mg'$  tablet. Do not exceed 6 tablets a day of Tylenol Arthritis '650mg'$  tablet   For example , you could take two tablets in the morning ( 8am) and then two tablets again at 4pm.   Maximum daily dose of acetaminophen 4 g per day from all sources.  If you are taking another medication which includes acetaminophen (Tylenol) which may be in cough and cold preparations or pain medication such as Percocet, you will need to factor that into your total daily dose to be safe.  Please let me know if any questions  A great article regarding how to safely take and dose tylenol found below.  Title : 'Acetaminophen safety: Be cautious but not afraid'  https://[email or web address].http://www.[place].org/

## 2022-11-09 NOTE — Progress Notes (Signed)
Assessment & Plan:  Mid back pain Assessment & Plan: Chronic, stable.  She feels presentation is similar to previous pain.  Patient declines updating imaging including MRI at this time.  Discussed pain management including tramadol 50 mg twice daily as needed.  Trial stop baclofen as called in sedation.  Trial of Robaxin to see if less sedating, more effective.  Encouraged use of a tylenol arthritis. she will also start PT at [facility].  I looked up patient on [place] Controlled Substances Reporting System PMP AWARE and saw no activity that raised concern of inappropriate use.    Orders: -     traMADol HCl; Take 1 tablet (50 mg total) by mouth every 12 (twelve) hours as needed.  Dispense: 30 tablet; Refill: 2 -     Methocarbamol; Take 1 tablet (500 mg total) by mouth 4 (four) times daily.  Dispense: 120 tablet; Refill: 2  Normocytic anemia -     IBC + Ferritin -     CBC with Differential/Platelet  Essential tremor Assessment & Plan: Tremor completely resolved when she stopped Wellbutrin.  Due to difficulty concentrating, she would like to resume Wellbutrin 150 mg daily.  She will let me know if tremor becomes more bothersome   Depression, major, single episode, complete remission ([place]) Assessment & Plan: Chronic, improved.  Continue Luvox 100 mg every morning, 50 mg every afternoon.  She will resume Wellbutrin 150 mg and monitor for tremor.      Return precautions given.   Risks, benefits, and alternatives of the medications and treatment plan prescribed today were discussed, and patient expressed understanding.   Education regarding symptom management and diagnosis given to patient on AVS either electronically or printed.  No follow-ups on file.  Mable Paris, FNP  Subjective:    Patient ID: Claudia Andersen, female    DOB: 1941-06-13, 82 y.o.   MRN: [id number]  CC: Claudia Andersen is a 82 y.o. female who presents today for follow up.   HPI: Chronic mid back pain. She feels  'has muscle spasm' upper mid back.  Baclofen is sedating for her.  She request refill of tramadol as been helpful in the past. No numbness, dysuria, fever, saddle anesthesia, urinary or fecal incontinence.      Appetite is good.    Improvement of pain with tylenol arthritis.   She is no longer on Wellbutrin.  She would like to resume if she has noticed concentration has become more difficult.  She is compliant with Luvox 100 mg qd, trazodone '100mg'$  qam and '50mg'$  qpm and feels depression has improved.   No h/o seizure.   Allergies: Patient has no known allergies. Current Outpatient Medications on File Prior to Visit  Medication Sig Dispense Refill   atorvastatin (LIPITOR) 10 MG tablet Take 1 tablet (10 mg total) by mouth daily. 90 tablet 3   baclofen (LIORESAL) 10 MG tablet TAKE 1 TABLET BY MOUTH 3 TIMES DAILY AS NEEDED FOR MUSCLE SPASMS. 90 each 3   Biotin 2500 MCG CAPS Take 2,500 mcg by mouth daily.     buPROPion (WELLBUTRIN XL) 150 MG 24 hr tablet Take 1 tablet (150 mg total) by mouth daily. 10 tablet 0   Clotrimazole 1 % OINT Apply 1 application topically 2 (two) times daily. 56.7 g 1   denosumab (PROLIA) 60 MG/ML SOSY injection Inject 60 mg into the skin every 6 (six) months.     diclofenac Sodium (VOLTAREN) 1 % GEL Apply 4 g topically 4 (four)  times daily. (Patient taking differently: Apply 4 g topically 4 (four) times daily as needed.) 100 g 1   Fluticasone Furoate (FLONASE SENSIMIST NA) Place 1 spray into both nostrils daily as needed (allergies).     fluvoxaMINE (LUVOX) 100 MG tablet Take 1 tablet (100 mg total) by mouth daily. 90 tablet 1   fluvoxaMINE (LUVOX) 50 MG tablet Take 1 tablet (50 mg total) by mouth at bedtime. Take with '100mg'$  luvox daily 90 tablet 1   gabapentin (NEURONTIN) 100 MG capsule Take 2 capsules (200 mg total) by mouth daily. 360 capsule 1   gabapentin (NEURONTIN) 300 MG capsule TAKE 1 CAPSULE BY MOUTH NIGHTLY 90 capsule 1   hydrochlorothiazide (MICROZIDE)  12.5 MG capsule Take 1 capsule (12.5 mg total) by mouth daily as needed. 30 capsule 3   Lactobacillus (PROBIOTIC ACIDOPHILUS PO) Take 1 tablet by mouth daily.     mupirocin ointment (BACTROBAN) 2 % Apply 1 application topically 2 (two) times daily. 22 g 1   pantoprazole (PROTONIX) 40 MG tablet TAKE ONE TABLET (40 MG) BY MOUTH EVERY DAY 1 HOUR BEFORE BREAKFAST 90 tablet 1   PREVIDENT 5000 PLUS 1.1 % CREA dental cream Take by mouth in the morning and at bedtime.     traZODone (DESYREL) 50 MG tablet Take 2 tablets (100 mg total) by mouth at bedtime. 180 tablet 3   triamcinolone (KENALOG) 0.025 % cream Apply 1 application topically 2 (two) times daily. 15 g 1   valACYclovir (VALTREX) 1000 MG tablet TAKE TWO TABLETS BY MOUTH EVERY 12 HOURSFOR 1 DAY. **START AS SOON AS POSSIBLE AFTER SYMPTOM ONSET** 6 tablet 1   No current facility-administered medications on file prior to visit.    Review of Systems  Constitutional:  Negative for chills and fever.  Respiratory:  Negative for cough.   Cardiovascular:  Negative for chest pain and palpitations.  Gastrointestinal:  Negative for nausea and vomiting.  Musculoskeletal:  Positive for back pain.  Neurological:  Negative for numbness.  Psychiatric/Behavioral:  The patient is not nervous/anxious.       Objective:    BP 128/80   Pulse 75   Temp 98.1 F (36.7 C) (Oral)   Ht 5' (1.524 m)   Wt 145 lb 3.2 oz (65.9 kg)   SpO2 98%   BMI 28.36 kg/m  BP Readings from Last 3 Encounters:  11/09/22 128/80  10/19/22 128/80  09/16/22 124/76   Wt Readings from Last 3 Encounters:  11/09/22 145 lb 3.2 oz (65.9 kg)  09/16/22 146 lb 3.2 oz (66.3 kg)  08/17/22 149 lb 4.8 oz (67.7 kg)    Physical Exam Vitals reviewed.  Constitutional:      Appearance: She is well-developed.  Eyes:     Conjunctiva/sclera: Conjunctivae normal.  Cardiovascular:     Rate and Rhythm: Normal rate and regular rhythm.     Pulses: Normal pulses.     Heart sounds: Normal heart  sounds.  Pulmonary:     Effort: Pulmonary effort is normal.     Breath sounds: Normal breath sounds. No wheezing, rhonchi or rales.  Skin:    General: Skin is warm and dry.  Neurological:     Mental Status: She is alert.  Psychiatric:        Speech: Speech normal.        Behavior: Behavior normal.        Thought Content: Thought content normal.

## 2022-11-09 NOTE — Assessment & Plan Note (Signed)
Tremor completely resolved when she stopped Wellbutrin.  Due to difficulty concentrating, she would like to resume Wellbutrin 150 mg daily.  She will let me know if tremor becomes more bothersome

## 2022-11-09 NOTE — Assessment & Plan Note (Addendum)
Chronic, improved.  Continue Luvox 100 mg every morning, 50 mg every afternoon.  She will resume Wellbutrin 150 mg and monitor for tremor.

## 2022-11-10 LAB — CBC WITH DIFFERENTIAL/PLATELET
Basophils Absolute: 0.1 10*3/uL (ref 0.0–0.1)
Basophils Relative: 0.6 % (ref 0.0–3.0)
Eosinophils Absolute: 0.3 10*3/uL (ref 0.0–0.7)
Eosinophils Relative: 2.3 % (ref 0.0–5.0)
HCT: 35.6 % — ABNORMAL LOW (ref 36.0–46.0)
Hemoglobin: 12.2 g/dL (ref 12.0–15.0)
Lymphocytes Relative: 13.3 % (ref 12.0–46.0)
Lymphs Abs: 1.6 10*3/uL (ref 0.7–4.0)
MCHC: 34.2 g/dL (ref 30.0–36.0)
MCV: 100.4 fl — ABNORMAL HIGH (ref 78.0–100.0)
Monocytes Absolute: 1 10*3/uL (ref 0.1–1.0)
Monocytes Relative: 8.3 % (ref 3.0–12.0)
Neutro Abs: 9.2 10*3/uL — ABNORMAL HIGH (ref 1.4–7.7)
Neutrophils Relative %: 75.5 % (ref 43.0–77.0)
Platelets: 280 10*3/uL (ref 150.0–400.0)
RBC: 3.55 Mil/uL — ABNORMAL LOW (ref 3.87–5.11)
RDW: 13.7 % (ref 11.5–15.5)
WBC: 12.2 10*3/uL — ABNORMAL HIGH (ref 4.0–10.5)

## 2022-11-10 LAB — IBC + FERRITIN
Ferritin: 40.6 ng/mL (ref 10.0–291.0)
Iron: 98 ug/dL (ref 42–145)
Saturation Ratios: 32 % (ref 20.0–50.0)
TIBC: 306.6 ug/dL (ref 250.0–450.0)
Transferrin: 219 mg/dL (ref 212.0–360.0)

## 2022-11-10 NOTE — Telephone Encounter (Signed)
I spoke with patient and she rescheduled appointment to 11/24/2022 at 9:30.

## 2022-11-11 ENCOUNTER — Other Ambulatory Visit: Payer: Medicare PPO

## 2022-11-15 ENCOUNTER — Telehealth: Payer: Self-pay

## 2022-11-15 NOTE — Telephone Encounter (Signed)
Pt returning call

## 2022-11-15 NOTE — Telephone Encounter (Signed)
-----  Message from Burnard Hawthorne, [place] sent at 11/15/2022  1:30 PM EST ----- [facility] pool  Please call patient.  I am a little bit confused.  She completed physical therapy in November at [facility].  Which she desired more physical therapy at [facility] for back pain?  She is spoken with Rasheedah and I wanted to clarify this.  Please let me know and include rasheedah on message so she is aware  ----- Message ----- From: Ashley Jacobs Sent: 11/14/2022   4:07 PM EST To: Burnard Hawthorne, FNP  Pt has had her physical therapy on those dates of 08/23/22-09/16/22. Is pt wanting more PT sessions? Please advise and Thank you!  ----- Message ----- From: Burnard Hawthorne, FNP Sent: 11/14/2022   3:13 PM EST To: Ashley Jacobs  Okay so pt is not wanting to pursue PT anymore or again?   Sorry Im confused ----- Message ----- From: Ashley Jacobs Sent: 11/10/2022  10:59 AM EST To: Burnard Hawthorne, FNP  Great morning!  I called to [facility] I was advised pt has had PT on 08/23/22-09/16/22.  ----- Message ----- From: Burnard Hawthorne, FNP Sent: 11/09/2022   2:35 PM EST To: Constance Haw YoungBlood  What is status of [facility] PT referral ?  Pt never heard from them.

## 2022-11-16 NOTE — Telephone Encounter (Signed)
Pt call returning Claudia Andersen call. Call was transferred.

## 2022-11-16 NOTE — Telephone Encounter (Signed)
LVM to call back to office  

## 2022-11-17 ENCOUNTER — Other Ambulatory Visit: Payer: Self-pay

## 2022-11-17 DIAGNOSIS — M549 Dorsalgia, unspecified: Secondary | ICD-10-CM

## 2022-11-17 DIAGNOSIS — L84 Corns and callosities: Secondary | ICD-10-CM | POA: Insufficient documentation

## 2022-11-18 NOTE — Telephone Encounter (Signed)
Noted  Claudia Andersen, new referral placed for PT

## 2022-11-21 DIAGNOSIS — M549 Dorsalgia, unspecified: Secondary | ICD-10-CM | POA: Diagnosis not present

## 2022-11-25 DIAGNOSIS — M549 Dorsalgia, unspecified: Secondary | ICD-10-CM | POA: Diagnosis not present

## 2022-11-28 DIAGNOSIS — M549 Dorsalgia, unspecified: Secondary | ICD-10-CM | POA: Diagnosis not present

## 2022-11-30 ENCOUNTER — Ambulatory Visit: Payer: Medicare PPO | Admitting: Family

## 2022-11-30 ENCOUNTER — Encounter: Payer: Self-pay | Admitting: Family

## 2022-11-30 VITALS — BP 126/78 | HR 78 | Temp 98.2°F | Wt 154.0 lb

## 2022-11-30 DIAGNOSIS — M549 Dorsalgia, unspecified: Secondary | ICD-10-CM

## 2022-11-30 NOTE — Assessment & Plan Note (Signed)
Significant improvement with PT. She will continue baclofen, tramadol prn. She declines images of her back at this time. She will let me know if pain were to worsen.

## 2022-11-30 NOTE — Progress Notes (Signed)
Assessment & Plan:  Mid back pain Assessment & Plan: Significant improvement with PT. She will continue baclofen, tramadol prn. She declines images of her back at this time. She will let me know if pain were to worsen.       Return precautions given.   Risks, benefits, and alternatives of the medications and treatment plan prescribed today were discussed, and patient expressed understanding.   Education regarding symptom management and diagnosis given to patient on AVS either electronically or printed.  Return in about 3 months (around 02/28/2023) for Medicare Wellness upcoming or due, schedule.  Claudia Paris, FNP  Subjective:    Patient ID: Claudia Andersen, female    DOB: 1941-05-08, 82 y.o.   MRN: [id number]  CC: Claudia Andersen is a 82 y.o. female who presents today for follow up.   HPI: Follow up mid left upper back pain which has returned to baseline  She is taking tramadol 50 mg twice daily as needed with relief.  Baclofen is more helpful than Robaxin.  Physical therapy at [facility] with massage relieved pain.  No upper arm weakness or numbness.     Allergies: Patient has no known allergies. Current Outpatient Medications on File Prior to Visit  Medication Sig Dispense Refill   atorvastatin (LIPITOR) 10 MG tablet Take 1 tablet (10 mg total) by mouth daily. 90 tablet 3   baclofen (LIORESAL) 10 MG tablet TAKE 1 TABLET BY MOUTH 3 TIMES DAILY AS NEEDED FOR MUSCLE SPASMS. 90 each 3   Biotin 2500 MCG CAPS Take 2,500 mcg by mouth daily.     buPROPion (WELLBUTRIN XL) 150 MG 24 hr tablet Take 1 tablet (150 mg total) by mouth daily. 10 tablet 0   Clotrimazole 1 % OINT Apply 1 application topically 2 (two) times daily. 56.7 g 1   denosumab (PROLIA) 60 MG/ML SOSY injection Inject 60 mg into the skin every 6 (six) months.     diclofenac Sodium (VOLTAREN) 1 % GEL Apply 4 g topically 4 (four) times daily. (Patient taking differently: Apply 4 g topically 4 (four) times daily as  needed.) 100 g 1   Fluticasone Furoate (FLONASE SENSIMIST NA) Place 1 spray into both nostrils daily as needed (allergies).     fluvoxaMINE (LUVOX) 100 MG tablet Take 1 tablet (100 mg total) by mouth daily. 90 tablet 1   fluvoxaMINE (LUVOX) 50 MG tablet Take 1 tablet (50 mg total) by mouth at bedtime. Take with 126m luvox daily 90 tablet 1   gabapentin (NEURONTIN) 100 MG capsule Take 2 capsules (200 mg total) by mouth daily. 360 capsule 1   gabapentin (NEURONTIN) 300 MG capsule TAKE 1 CAPSULE BY MOUTH NIGHTLY 90 capsule 1   hydrochlorothiazide (MICROZIDE) 12.5 MG capsule Take 1 capsule (12.5 mg total) by mouth daily as needed. 30 capsule 3   Lactobacillus (PROBIOTIC ACIDOPHILUS PO) Take 1 tablet by mouth daily.     methocarbamol (ROBAXIN) 500 MG tablet Take 1 tablet (500 mg total) by mouth 4 (four) times daily. 120 tablet 2   mupirocin ointment (BACTROBAN) 2 % Apply 1 application topically 2 (two) times daily. 22 g 1   pantoprazole (PROTONIX) 40 MG tablet TAKE ONE TABLET (40 MG) BY MOUTH EVERY DAY 1 HOUR BEFORE BREAKFAST 90 tablet 1   PREVIDENT 5000 PLUS 1.1 % CREA dental cream Take by mouth in the morning and at bedtime.     traMADol (ULTRAM) 50 MG tablet Take 1 tablet (50 mg total) by mouth every 12 (  twelve) hours as needed. 30 tablet 2   traZODone (DESYREL) 50 MG tablet Take 2 tablets (100 mg total) by mouth at bedtime. 180 tablet 3   triamcinolone (KENALOG) 0.025 % cream Apply 1 application topically 2 (two) times daily. 15 g 1   valACYclovir (VALTREX) 1000 MG tablet TAKE TWO TABLETS BY MOUTH EVERY 12 HOURSFOR 1 DAY. **START AS SOON AS POSSIBLE AFTER SYMPTOM ONSET** 6 tablet 1   No current facility-administered medications on file prior to visit.    Review of Systems  Constitutional:  Negative for chills and fever.  Respiratory:  Negative for cough.   Cardiovascular:  Negative for chest pain and palpitations.  Gastrointestinal:  Negative for nausea and vomiting.  Musculoskeletal:   Positive for back pain.  Neurological:  Negative for numbness.      Objective:    BP 126/78   Pulse 78   Temp 98.2 F (36.8 C) (Oral)   Wt 154 lb (69.9 kg)   SpO2 98%   BMI 30.08 kg/m  BP Readings from Last 3 Encounters:  11/30/22 126/78  11/09/22 128/80  10/19/22 128/80   Wt Readings from Last 3 Encounters:  11/30/22 154 lb (69.9 kg)  11/09/22 145 lb 3.2 oz (65.9 kg)  09/16/22 146 lb 3.2 oz (66.3 kg)    Physical Exam Vitals reviewed.  Constitutional:      Appearance: She is well-developed.  Eyes:     Conjunctiva/sclera: Conjunctivae normal.  Cardiovascular:     Rate and Rhythm: Normal rate and regular rhythm.     Pulses: Normal pulses.     Heart sounds: Normal heart sounds.  Pulmonary:     Effort: Pulmonary effort is normal.     Breath sounds: Normal breath sounds. No wheezing, rhonchi or rales.  Musculoskeletal:       Arms:     Comments: Slight tenderness with deep palpation of left upper back over scapula. No erythema, edema, gross abnormality or mass appreciated.   Skin:    General: Skin is warm and dry.  Neurological:     Mental Status: She is alert.  Psychiatric:        Speech: Speech normal.        Behavior: Behavior normal.        Thought Content: Thought content normal.

## 2022-12-01 ENCOUNTER — Encounter: Payer: Medicare PPO | Admitting: *Deleted

## 2022-12-03 ENCOUNTER — Encounter: Payer: Self-pay | Admitting: Family

## 2022-12-05 ENCOUNTER — Other Ambulatory Visit: Payer: Self-pay

## 2022-12-05 DIAGNOSIS — M5137 Other intervertebral disc degeneration, lumbosacral region: Secondary | ICD-10-CM

## 2022-12-05 MED ORDER — BACLOFEN 10 MG PO TABS: 10.0000 mg | ORAL_TABLET | Freq: Three times a day (TID) | ORAL | 3 refills | Status: DC | PRN

## 2022-12-15 ENCOUNTER — Telehealth: Payer: Self-pay

## 2022-12-15 ENCOUNTER — Ambulatory Visit (INDEPENDENT_AMBULATORY_CARE_PROVIDER_SITE_OTHER): Payer: Medicare PPO | Admitting: *Deleted

## 2022-12-15 DIAGNOSIS — Z0000 Encounter for general adult medical examination without abnormal findings: Secondary | ICD-10-CM

## 2022-12-15 NOTE — Telephone Encounter (Signed)
Lm for pt to cb re : move appt , doesn't need f/u for 3 mo per margaret, if issues, can see her Monday.

## 2022-12-15 NOTE — Progress Notes (Deleted)
I connected with  [name] on 12/15/22 by a audio enabled telemedicine application and verified that I am speaking with the correct person using two identifiers.  Patient Location: Home  Provider Location: Office/Clinic  I discussed the limitations of evaluation and management by telemedicine. The patient expressed understanding and agreed to proceed.   Subjective:   Claudia Andersen is a 82 y.o. female who presents for Medicare Annual (Subsequent) preventive examination.       Objective:    There were no vitals filed for this visit. There is no height or weight on file to calculate BMI.     10/26/2021   12:07 PM 07/06/2021    3:28 PM 04/01/2021   10:07 AM 11/17/2020   11:19 AM 07/23/2020    1:54 PM 05/08/2020    6:27 PM 07/23/2019   10:21 AM  Advanced Directives  Does Patient Have a Medical Advance Directive? Yes Yes Yes Yes Yes Yes Yes  Type of Paramedic of [place];Living will Living will;Healthcare Power of [place];Living will Living will;Healthcare Power of Attorney   Living will  Does patient want to make changes to medical advance directive? No - Patient declined  No - Patient declined  No - Patient declined  No - Patient declined  Copy of [place] in Chart? No - copy requested  No - copy requested        Current Medications (verified) Outpatient Encounter Medications as of 12/15/2022  Medication Sig   atorvastatin (LIPITOR) 10 MG tablet Take 1 tablet (10 mg total) by mouth daily.   baclofen (LIORESAL) 10 MG tablet Take 1 tablet (10 mg total) by mouth 3 (three) times daily as needed for muscle spasms.   Biotin 2500 MCG CAPS Take 2,500 mcg by mouth daily.   buPROPion (WELLBUTRIN XL) 150 MG 24 hr tablet Take 1 tablet (150 mg total) by mouth daily.   Clotrimazole 1 % OINT Apply 1 application topically 2 (two) times daily.   denosumab (PROLIA) 60 MG/ML SOSY injection Inject 60 mg into the skin  every 6 (six) months.   diclofenac Sodium (VOLTAREN) 1 % GEL Apply 4 g topically 4 (four) times daily. (Patient taking differently: Apply 4 g topically 4 (four) times daily as needed.)   Fluticasone Furoate (FLONASE SENSIMIST NA) Place 1 spray into both nostrils daily as needed (allergies).   fluvoxaMINE (LUVOX) 100 MG tablet Take 1 tablet (100 mg total) by mouth daily.   fluvoxaMINE (LUVOX) 50 MG tablet Take 1 tablet (50 mg total) by mouth at bedtime. Take with '100mg'$  luvox daily   gabapentin (NEURONTIN) 100 MG capsule Take 2 capsules (200 mg total) by mouth daily.   gabapentin (NEURONTIN) 300 MG capsule TAKE 1 CAPSULE BY MOUTH NIGHTLY   hydrochlorothiazide (MICROZIDE) 12.5 MG capsule Take 1 capsule (12.5 mg total) by mouth daily as needed.   Lactobacillus (PROBIOTIC ACIDOPHILUS PO) Take 1 tablet by mouth daily.   methocarbamol (ROBAXIN) 500 MG tablet Take 1 tablet (500 mg total) by mouth 4 (four) times daily.   mupirocin ointment (BACTROBAN) 2 % Apply 1 application topically 2 (two) times daily.   pantoprazole (PROTONIX) 40 MG tablet TAKE ONE TABLET (40 MG) BY MOUTH EVERY DAY 1 HOUR BEFORE BREAKFAST   PREVIDENT 5000 PLUS 1.1 % CREA dental cream Take by mouth in the morning and at bedtime.   traMADol (ULTRAM) 50 MG tablet Take 1 tablet (50 mg total) by mouth every 12 (twelve)  hours as needed.   traZODone (DESYREL) 50 MG tablet Take 2 tablets (100 mg total) by mouth at bedtime.   triamcinolone (KENALOG) 0.025 % cream Apply 1 application topically 2 (two) times daily.   valACYclovir (VALTREX) 1000 MG tablet TAKE TWO TABLETS BY MOUTH EVERY 12 HOURSFOR 1 DAY. **START AS SOON AS POSSIBLE AFTER SYMPTOM ONSET**   No facility-administered encounter medications on file as of 12/15/2022.    Allergies (verified) Patient has no known allergies.   History: Past Medical History:  Diagnosis Date   Ankle fracture, bimalleolar, closed, right, initial encounter 10/26/2018   d/t fall 10/26/18   Arthritis     generalized   Back abrasion    CRUSHED VERTEBRAE   Cataract    bilateral sx    Closed fracture of shaft of fibula 05/11/2020   Closed right radial fracture 10/26/2018   s/p a fall   Compression of lumbar vertebra (HCC)    L1 and L4 (from fall November 05, 2018)   Degenerative disc disease, lumbar    Depression    hx of-on meds   Gallstones    GERD (gastroesophageal reflux disease)    on meds   Migraines    Osteoporosis    on Prolia inj   Pulmonary emboli (HCC)    history of emboli   Past Surgical History:  Procedure Laterality Date   ABDOMINAL HYSTERECTOMY     APPENDECTOMY     CATARACT EXTRACTION W/PHACO Right 03/27/2018   Procedure: CATARACT EXTRACTION PHACO AND INTRAOCULAR LENS PLACEMENT ([place]);  Surgeon: Birder Robson, MD;  Location: [facility];  Service: Ophthalmology;  Laterality: Right;  [place] 00:33 AP% 15.3 CDE 5.18 Fluid pack lot # [id number]   CATARACT EXTRACTION W/PHACO Left 06/26/2018   Procedure: CATARACT EXTRACTION PHACO AND INTRAOCULAR LENS PLACEMENT ([place]);  Surgeon: Birder Robson, MD;  Location: [facility];  Service: Ophthalmology;  Laterality: Left;  [place] 00:38 AP% 15.2 CDE 5.80 Fluid pack lot # [id number]   FRACTURE SURGERY Right    RIGHT RADIAL wrist   METATARSAL HEAD EXCISION Right 04/01/2021   Procedure: METATARSAL HEAD EXCISION- Tailors Exostectomy -Right;  Surgeon: Caroline More, DPM;  Location: 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   Procedure: TOTAL HIP ARTHROPLASTY ANTERIOR APPROACH;  Surgeon: Lovell Sheehan, MD;  Location: [facility];  Service: Orthopedics;  Laterality: Right;   TOTAL KNEE ARTHROPLASTY Left    TRIPLE SUBTALAR FUSION     WISDOM TOOTH EXTRACTION     Family History  Problem Relation Age of Onset   Arthritis Mother    Stroke Mother 34   Hemachromatosis Mother    Heart disease Father 77    Pneumonia Maternal Grandmother    Heart disease Maternal Grandfather    Heart disease Paternal Grandmother    Heart disease Paternal Grandfather    Breast cancer Neg Hx    Colon cancer Neg Hx    Colon polyps Neg Hx    Esophageal cancer Neg Hx    Rectal cancer Neg Hx    Stomach cancer Neg Hx    Social History   Socioeconomic History   Marital status: Divorced    Spouse name: Not on file   Number of children: 1   Years of education: Not on file   Highest education level: Not on file  Occupational History   Occupation: retired  Tobacco Use  Smoking status: Former    Packs/day: 0.50    Years: 22.00    Total pack years: 11.00    Types: Cigarettes    Quit date: 41    Years since quitting: 42.1   Smokeless tobacco: Never  Vaping Use   Vaping Use: Never used  Substance and Sexual Activity   Alcohol use: Yes    Alcohol/week: 10.0 standard drinks of alcohol    Types: 10 Standard drinks or equivalent per week   Drug use: Never   Sexual activity: Not on file  Other Topics Concern   Not on file  Social History Narrative   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    Lives at [place] Strain: Low Risk  (10/26/2021)   Overall Financial Resource Strain (CARDIA)    Difficulty of Paying Living Expenses: Not hard at all  Food Insecurity: No Food Insecurity (10/26/2021)   Hunger Vital Sign    Worried About Running Out of Food in the Last Year: Never true    Ran Out of Food in the Last Year: Never true  Transportation Needs: No Transportation Needs (10/26/2021)   PRAPARE - Hydrologist (Medical): No    Lack of Transportation (Non-Medical): No  Physical Activity: Sufficiently Active (10/26/2021)   Exercise Vital Sign    Days of Exercise per Week: 7 days    Minutes of Exercise per Session: 60 min  Stress: No Stress Concern Present (10/26/2021)   [place]    Feeling of Stress : Not at all  Social Connections: Not on file    Tobacco Counseling Counseling given: Not Answered   Clinical Intake:                 Diabetic? No         Activities of Daily Living     No data to display          Patient Care Team: Burnard Hawthorne, FNP as PCP - General (Family Medicine)  Indicate any recent Medical Services you may have received from other than [facility] providers in the past year (date may be approximate).     Assessment:   This is a routine wellness examination for Claudia Andersen.  Hearing/Vision screen No results found.  Dietary issues and exercise activities discussed:     Goals Addressed   None    Depression Screen    11/30/2022    9:42 AM 11/09/2022    2:20 PM 09/16/2022    3:10 PM 08/17/2022   11:42 AM 03/29/2022   10:48 AM 02/09/2022   10:42 AM 12/27/2021   10:15 AM  PHQ 2/9 Scores  PHQ - 2 Score 0 0 2 2 0 0 0  PHQ- 9 Score 0 0 4 4  0 0    Fall Risk    11/30/2022    9:42 AM 11/09/2022    2:19 PM 09/16/2022    3:10 PM 08/17/2022   11:42 AM 03/29/2022   10:48 AM  Fall Risk   Falls in the past year? 0 0 0 0 0  Number falls in past yr: 0 0 0 0 0  Injury with Fall? 0 0 0 0 0  Risk for fall due to : No Fall Risks No Fall Risks No Fall Risks No Fall Risks No Fall Risks  Follow up Falls evaluation completed Falls  evaluation completed Falls evaluation completed Falls evaluation completed Falls evaluation completed    FALL RISK PREVENTION PERTAINING TO THE HOME:  Any stairs in or around the home? {YES/NO:21197} If so, are there any without handrails? {YES/NO:21197} Home free of loose throw rugs in walkways, pet beds, electrical cords, etc? {YES/NO:21197} Adequate lighting in your home to reduce risk of falls? {YES/NO:21197}  ASSISTIVE DEVICES UTILIZED TO PREVENT FALLS:  Life alert? {YES/NO:21197} Use of a cane, walker or w/c? {YES/NO:21197} Grab bars in the bathroom?  {YES/NO:21197} Shower chair or bench in shower? {YES/NO:21197} Elevated toilet seat or a handicapped toilet? {YES/NO:21197}    Cognitive Function:    07/19/2018   10:22 AM  MMSE - Mini Mental State Exam  Orientation to time 5  Orientation to Place 5  Registration 3  Attention/ Calculation 5  Recall 3  Language- name 2 objects 2  Language- repeat 1  Language- follow 3 step command 3  Language- read & follow direction 1  Write a sentence 1  Copy design 1  Total score 30        07/23/2019   10:22 AM  6CIT Screen  What Year? 0 points  What month? 0 points  What time? 0 points  Count back from 20 0 points  Months in reverse 0 points  Repeat phrase 0 points  Total Score 0 points    Immunizations Immunization History  Administered Date(s) Administered   Influenza, High Dose Seasonal PF 07/13/2015, 07/31/2017, 07/19/2018, 07/08/2021   Influenza, Seasonal, Injecte, Preservative Fre 08/15/2006, 07/21/2009, 07/15/2010, 07/18/2011, 06/30/2012   Influenza,inj,Quad PF,6+ Mos 08/08/2014, 07/20/2016   Influenza,inj,quad, With Preservative 07/18/2019   Moderna Sars-Covid-2 Vaccination 10/31/2019, 12/02/2019, 08/28/2020   Pfizer Covid-19 Vaccine Bivalent Booster 39yr & up 07/08/2021   Pneumococcal Conjugate-13 08/18/2014, 07/20/2015   Pneumococcal Polysaccharide-23 12/15/2008   Td 12/04/2007   Zoster Recombinat (Shingrix) 11/30/2018, 03/19/2019   Zoster, Live 06/25/2012    {TDAP status:2101805}  {Flu Vaccine status:2101806}  {Pneumococcal vaccine status:2101807}  {Covid-19 vaccine status:2101808}  Qualifies for Shingles Vaccine? {YES/NO:21197}  Zostavax completed {YES/NO:21197}  {Shingrix Completed?:2101804}  Screening Tests Health Maintenance  Topic Date Due   DTaP/Tdap/Td (2 - Tdap) 12/03/2017   Medicare Annual Wellness (AWV)  10/26/2022   COVID-19 Vaccine (5 - 2023-24 season) 12/16/2022 (Originally 06/17/2022)   INFLUENZA VACCINE  01/15/2023 (Originally 05/17/2022)    Pneumonia Vaccine 82 Years old  Completed   DEXA SCAN  Completed   Zoster Vaccines- Shingrix  Completed   HPV VACCINES  Aged Out    Health Maintenance  Health Maintenance Due  Topic Date Due   DTaP/Tdap/Td (2 - Tdap) 12/03/2017   Medicare Annual Wellness (AWV)  10/26/2022    {Colorectal cancer screening:2101809}  {Mammogram status:21018020}  {Bone Density status:21018021}  Lung Cancer Screening: (Low Dose CT Chest recommended if Age 81-80years, 30 pack-year currently smoking OR have quit w/in 15years.) {DOES NOT does:27190::"does not"} qualify.   Lung Cancer Screening Referral: ***  Additional Screening:  Hepatitis C Screening: {DOES NOT does:27190::"does not"} qualify; Completed ***  Vision Screening: Recommended annual ophthalmology exams for early detection of glaucoma and other disorders of the eye. Is the patient up to date with their annual eye exam?  {YES/NO:21197} Who is the provider or what is the name of the office in which the patient attends annual eye exams? *** If pt is not established with a provider, would they like to be referred to a provider to establish care? {YES/NO:21197}.   Dental Screening: Recommended annual dental exams for  proper oral hygiene  Community Resource Referral / Chronic Care Management: CRR required this visit?  {YES/NO:21197}  CCM required this visit?  {YES/NO:21197}     Plan:     I have personally reviewed and noted the following in the patient's chart:   Medical and social history Use of alcohol, tobacco or illicit drugs  Current medications and supplements including opioid prescriptions. {Opioid Prescriptions:[phone]} Functional ability and status Nutritional status Physical activity Advanced directives List of other physicians Hospitalizations, surgeries, and ER visits in previous 12 months Vitals Screenings to include cognitive, depression, and falls Referrals and appointments  In addition, I have reviewed  and discussed with patient certain preventive protocols, quality metrics, and best practice recommendations. A written personalized care plan for preventive services as well as general preventive health recommendations were provided to patient.     Cannon Kettle, [place]   12/15/2022   Nurse Notes: ***

## 2022-12-16 ENCOUNTER — Ambulatory Visit (INDEPENDENT_AMBULATORY_CARE_PROVIDER_SITE_OTHER): Payer: Medicare PPO

## 2022-12-16 ENCOUNTER — Encounter: Payer: Self-pay | Admitting: Family

## 2022-12-16 ENCOUNTER — Other Ambulatory Visit (HOSPITAL_COMMUNITY)
Admission: RE | Admit: 2022-12-16 | Discharge: 2022-12-16 | Disposition: A | Discharge status: Home / Self Care | Payer: Medicare PPO | Source: Ambulatory Visit | Attending: Family | Admitting: Family

## 2022-12-16 ENCOUNTER — Ambulatory Visit: Payer: Medicare PPO | Admitting: Family

## 2022-12-16 VITALS — Ht 60.0 in | Wt 153.2 lb

## 2022-12-16 DIAGNOSIS — M25512 Pain in left shoulder: Secondary | ICD-10-CM | POA: Diagnosis not present

## 2022-12-16 DIAGNOSIS — M1612 Unilateral primary osteoarthritis, left hip: Secondary | ICD-10-CM | POA: Diagnosis not present

## 2022-12-16 DIAGNOSIS — G8929 Other chronic pain: Secondary | ICD-10-CM

## 2022-12-16 DIAGNOSIS — M25551 Pain in right hip: Secondary | ICD-10-CM

## 2022-12-16 DIAGNOSIS — N939 Abnormal uterine and vaginal bleeding, unspecified: Secondary | ICD-10-CM | POA: Insufficient documentation

## 2022-12-16 DIAGNOSIS — M47816 Spondylosis without myelopathy or radiculopathy, lumbar region: Secondary | ICD-10-CM | POA: Diagnosis not present

## 2022-12-16 DIAGNOSIS — R413 Other amnesia: Secondary | ICD-10-CM | POA: Diagnosis not present

## 2022-12-16 DIAGNOSIS — M549 Dorsalgia, unspecified: Secondary | ICD-10-CM | POA: Diagnosis not present

## 2022-12-16 LAB — CBC WITH DIFFERENTIAL/PLATELET
Basophils Absolute: 0.1 10*3/uL (ref 0.0–0.1)
Basophils Relative: 1.2 % (ref 0.0–3.0)
Eosinophils Absolute: 0.2 10*3/uL (ref 0.0–0.7)
Eosinophils Relative: 2.5 % (ref 0.0–5.0)
HCT: 35.3 % — ABNORMAL LOW (ref 36.0–46.0)
Hemoglobin: 12 g/dL (ref 12.0–15.0)
Lymphocytes Relative: 18.3 % (ref 12.0–46.0)
Lymphs Abs: 1.2 10*3/uL (ref 0.7–4.0)
MCHC: 34.1 g/dL (ref 30.0–36.0)
MCV: 98.3 fl (ref 78.0–100.0)
Monocytes Absolute: 0.8 10*3/uL (ref 0.1–1.0)
Monocytes Relative: 11.3 % (ref 3.0–12.0)
Neutro Abs: 4.5 10*3/uL (ref 1.4–7.7)
Neutrophils Relative %: 66.7 % (ref 43.0–77.0)
Platelets: 239 10*3/uL (ref 150.0–400.0)
RBC: 3.59 Mil/uL — ABNORMAL LOW (ref 3.87–5.11)
RDW: 13.1 % (ref 11.5–15.5)
WBC: 6.8 10*3/uL (ref 4.0–10.5)

## 2022-12-16 LAB — TSH: TSH: 1.75 u[IU]/mL (ref 0.35–5.50)

## 2022-12-16 LAB — B12 AND FOLATE PANEL
Folate: >23.9 ng/mL (ref >5.9)
Vitamin B-12: 1118 pg/mL — ABNORMAL HIGH (ref 211–911)

## 2022-12-16 NOTE — Patient Instructions (Addendum)
You may on occasion take a third dose of tramadol 50 mg.    I typically prescribe tramadol 50 mg twice daily.    I am concerned that you may become overly sedated on tramadol, baclofen, gabapentin.    Also would like for you to start Tylenol arthritis 650 mg tablet.  You may take this 1 tablet in the morning and 1 tablet in the afternoon.

## 2022-12-16 NOTE — Progress Notes (Unsigned)
Assessment & Plan:  There are no diagnoses linked to this encounter.   Return precautions given.   Risks, benefits, and alternatives of the medications and treatment plan prescribed today were discussed, and patient expressed understanding.   Education regarding symptom management and diagnosis given to patient on AVS either electronically or printed.  No follow-ups on file.  Claudia Paris, FNP  Subjective:    Patient ID: Claudia Andersen, female    DOB: [birth year], 82 y.o.   MRN: [id number]  CC: Claudia Andersen is a 82 y.o. female who presents today for follow up.   HPI: She had traveled to [place] and when she came back a week ago, it took me '4 hands to remember how to play' .   She hasn't forgotten names, familiar places or names. She has had lost her phones, glasses.   No ha, vision changes.   Family hasn't mentioned any concerns.   No depression and 'feels in a good place. '  She complains of left shoulder pain x months. Improved with PT. Sleeping in upright position on adjustable bed and thinks this makes worse. No numbness, weakness in arm.  She is interested in increasing tramadol to TID.    She is using baclofen 10 BID, gabapentin '200mg'$  qam, at noon and '300mg'$  qhs. She is not sedated on regimen. She complains of right lateral hip pain. S/p right total hip replacement. No pain when walking. No groin pain, saddle anesthesia.   She complains of yellow vaginal discharge. No itchy. She noted scant fresh blood noticed last week coming from vagina. No bleeding from rectum. She denies hemorrhoids or feeling bulging from vagina.  No dysuria, fever.  She is not sexually active.   H/o hysterectomy.  Allergies: Patient has no known allergies. Current Outpatient Medications on File Prior to Visit  Medication Sig Dispense Refill   atorvastatin (LIPITOR) 10 MG tablet Take 1 tablet (10 mg total) by mouth daily. 90 tablet 3   baclofen (LIORESAL) 10 MG tablet Take 1 tablet (10 mg  total) by mouth 3 (three) times daily as needed for muscle spasms. 90 each 3   Biotin 2500 MCG CAPS Take 2,500 mcg by mouth daily.     buPROPion (WELLBUTRIN XL) 150 MG 24 hr tablet Take 1 tablet (150 mg total) by mouth daily. 10 tablet 0   Clotrimazole 1 % OINT Apply 1 application topically 2 (two) times daily. 56.7 g 1   denosumab (PROLIA) 60 MG/ML SOSY injection Inject 60 mg into the skin every 6 (six) months.     diclofenac Sodium (VOLTAREN) 1 % GEL Apply 4 g topically 4 (four) times daily. (Patient taking differently: Apply 4 g topically 4 (four) times daily as needed.) 100 g 1   Fluticasone Furoate (FLONASE SENSIMIST NA) Place 1 spray into both nostrils daily as needed (allergies).     fluvoxaMINE (LUVOX) 100 MG tablet Take 1 tablet (100 mg total) by mouth daily. 90 tablet 1   fluvoxaMINE (LUVOX) 50 MG tablet Take 1 tablet (50 mg total) by mouth at bedtime. Take with '100mg'$  luvox daily 90 tablet 1   gabapentin (NEURONTIN) 100 MG capsule Take 2 capsules (200 mg total) by mouth daily. 360 capsule 1   gabapentin (NEURONTIN) 300 MG capsule TAKE 1 CAPSULE BY MOUTH NIGHTLY 90 capsule 1   hydrochlorothiazide (MICROZIDE) 12.5 MG capsule Take 1 capsule (12.5 mg total) by mouth daily as needed. 30 capsule 3   Lactobacillus (PROBIOTIC ACIDOPHILUS PO) Take 1 tablet by  mouth daily.     methocarbamol (ROBAXIN) 500 MG tablet Take 1 tablet (500 mg total) by mouth 4 (four) times daily. 120 tablet 2   mupirocin ointment (BACTROBAN) 2 % Apply 1 application topically 2 (two) times daily. 22 g 1   pantoprazole (PROTONIX) 40 MG tablet TAKE ONE TABLET (40 MG) BY MOUTH EVERY DAY 1 HOUR BEFORE BREAKFAST 90 tablet 1   PREVIDENT 5000 PLUS 1.1 % CREA dental cream Take by mouth in the morning and at bedtime.     traMADol (ULTRAM) 50 MG tablet Take 1 tablet (50 mg total) by mouth every 12 (twelve) hours as needed. 30 tablet 2   traZODone (DESYREL) 50 MG tablet Take 2 tablets (100 mg total) by mouth at bedtime. 180 tablet 3    triamcinolone (KENALOG) 0.025 % cream Apply 1 application topically 2 (two) times daily. 15 g 1   valACYclovir (VALTREX) 1000 MG tablet TAKE TWO TABLETS BY MOUTH EVERY 12 HOURSFOR 1 DAY. **START AS SOON AS POSSIBLE AFTER SYMPTOM ONSET** 6 tablet 1   No current facility-administered medications on file prior to visit.    Review of Systems    Objective:    There were no vitals taken for this visit. BP Readings from Last 3 Encounters:  11/30/22 126/78  11/09/22 128/80  10/19/22 128/80   Wt Readings from Last 3 Encounters:  11/30/22 154 lb (69.9 kg)  11/09/22 145 lb 3.2 oz (65.9 kg)  09/16/22 146 lb 3.2 oz (66.3 kg)    Physical Exam

## 2022-12-16 NOTE — Assessment & Plan Note (Signed)
Reassuring neurologic exam today.  Pending lab evaluation.  Patient politely declines MRI of the brain although  I have emphasized the importance of this if she continues to notice memory changes or family number notices memory changes.  Plan to obtain MMSE at followup.

## 2022-12-16 NOTE — Assessment & Plan Note (Signed)
One occurrence last week.  Patient has h/o hysterectomy.  Pending urinalysis, vaginal swab.  Patient politely declines pelvic exam today.  If recurs, advised patient that she would absolutely pelvic exam to determine source of blood

## 2022-12-17 LAB — RPR: RPR Ser Ql: NONREACTIVE

## 2022-12-18 ENCOUNTER — Encounter: Payer: Self-pay | Admitting: Family

## 2022-12-18 NOTE — Assessment & Plan Note (Addendum)
Status post right hip replacement.  She is currently taking baclofen 10 BID prn, gabapentin '200mg'$  qam, at noon and '300mg'$  qhs , tramadol 50 mg twice daily  prn.  I did advise she may take an additional tramadol '50mg'$  during the 24-hour period for severe pain.  counseled her on risk of sedation.  Encouraged her to schedule Tylenol arthritis 1 in the morning and 1 in the evening.  Consider referral to pain management

## 2022-12-18 NOTE — Assessment & Plan Note (Signed)
Anticipate arthritic changes.  Consider x-ray cervical spine.

## 2022-12-20 DIAGNOSIS — D2261 Melanocytic nevi of right upper limb, including shoulder: Secondary | ICD-10-CM | POA: Diagnosis not present

## 2022-12-20 DIAGNOSIS — D225 Melanocytic nevi of trunk: Secondary | ICD-10-CM | POA: Diagnosis not present

## 2022-12-20 DIAGNOSIS — I890 Lymphedema, not elsewhere classified: Secondary | ICD-10-CM | POA: Diagnosis not present

## 2022-12-20 DIAGNOSIS — D2262 Melanocytic nevi of left upper limb, including shoulder: Secondary | ICD-10-CM | POA: Diagnosis not present

## 2022-12-20 DIAGNOSIS — L821 Other seborrheic keratosis: Secondary | ICD-10-CM | POA: Diagnosis not present

## 2022-12-20 DIAGNOSIS — D2271 Melanocytic nevi of right lower limb, including hip: Secondary | ICD-10-CM | POA: Diagnosis not present

## 2022-12-20 DIAGNOSIS — D2272 Melanocytic nevi of left lower limb, including hip: Secondary | ICD-10-CM | POA: Diagnosis not present

## 2022-12-20 LAB — CERVICOVAGINAL ANCILLARY ONLY
Bacterial Vaginitis (gardnerella): NEGATIVE
Chlamydia: NEGATIVE
Comment: NEGATIVE
Comment: NEGATIVE
Comment: NEGATIVE
Comment: NEGATIVE
Comment: NEGATIVE
Comment: NORMAL
Neisseria Gonorrhea: NEGATIVE

## 2022-12-22 LAB — URINALYSIS, ROUTINE W REFLEX MICROSCOPIC
Bilirubin Urine: NEGATIVE
Hgb urine dipstick: NEGATIVE
Ketones, ur: NEGATIVE
Leukocytes,Ua: NEGATIVE
Nitrite: NEGATIVE
RBC / HPF: NONE SEEN (ref 0–?)
Specific Gravity, Urine: 1.025 (ref 1.000–1.030)
Total Protein, Urine: NEGATIVE
Urine Glucose: NEGATIVE
Urobilinogen, UA: 0.2 (ref 0.0–1.0)
pH: 6 (ref 5.0–8.0)

## 2022-12-26 DIAGNOSIS — M79674 Pain in right toe(s): Secondary | ICD-10-CM | POA: Diagnosis not present

## 2022-12-27 ENCOUNTER — Other Ambulatory Visit: Payer: Self-pay | Admitting: Family

## 2022-12-27 DIAGNOSIS — K219 Gastro-esophageal reflux disease without esophagitis: Secondary | ICD-10-CM

## 2023-01-06 ENCOUNTER — Telehealth: Payer: Self-pay | Admitting: Family

## 2023-01-06 ENCOUNTER — Other Ambulatory Visit: Payer: Self-pay | Admitting: Family

## 2023-01-06 DIAGNOSIS — M5137 Other intervertebral disc degeneration, lumbosacral region: Secondary | ICD-10-CM

## 2023-01-06 NOTE — Telephone Encounter (Signed)
Prescription Request  01/06/2023  LOV: 12/16/2022  What is the name of the medication or equipment? gabapentin (NEURONTIN) 100 MG capsule  Have you contacted your pharmacy to request a refill? Yes   Which pharmacy would you like this sent to?   [facility] - [place], [place] - [address] [address] Phone: [phone] Fax: [phone]      Patient notified that their request is being sent to the clinical staff for review and that they should receive a response within 2 business days.   Please advise at Mobile [phone] (mobile)

## 2023-01-06 NOTE — Telephone Encounter (Signed)
LVM to call back to see which dosage of Gabapentin she is currently taking and how often

## 2023-01-09 ENCOUNTER — Telehealth: Payer: Self-pay | Admitting: Family

## 2023-01-09 MED ORDER — GABAPENTIN 100 MG PO CAPS: 200.0000 mg | ORAL_CAPSULE | Freq: Every day | ORAL | 3 refills | Status: DC

## 2023-01-09 NOTE — Telephone Encounter (Signed)
LVM to see which dosage of Gabapentin she is taking

## 2023-01-09 NOTE — Telephone Encounter (Signed)
DONE

## 2023-01-09 NOTE — Telephone Encounter (Signed)
Pt need a refill on gabapentin [facility]

## 2023-01-09 NOTE — Telephone Encounter (Signed)
Pt came in person and stated her dosage is 100mg 

## 2023-01-09 NOTE — Telephone Encounter (Signed)
Noted I sent in 1 year supply

## 2023-01-09 NOTE — Addendum Note (Signed)
Addended by: Burnard Hawthorne on: 01/09/2023 03:37 PM   Modules accepted: Orders

## 2023-01-09 NOTE — Telephone Encounter (Signed)
Pt came into office and stated that she is taking Gabapentin  the 100 mg 2 in the am and 2 at night. Can you please send in so she can get it today. Thanks

## 2023-01-11 ENCOUNTER — Encounter: Payer: Self-pay | Admitting: Family

## 2023-01-11 ENCOUNTER — Other Ambulatory Visit: Payer: Self-pay | Admitting: Family

## 2023-01-11 DIAGNOSIS — M5137 Other intervertebral disc degeneration, lumbosacral region: Secondary | ICD-10-CM

## 2023-01-11 MED ORDER — GABAPENTIN 100 MG PO CAPS: 200.0000 mg | ORAL_CAPSULE | Freq: Two times a day (BID) | ORAL | 1 refills | Status: DC

## 2023-01-16 ENCOUNTER — Ambulatory Visit: Payer: Medicare PPO | Admitting: Family

## 2023-01-16 DIAGNOSIS — M25532 Pain in left wrist: Secondary | ICD-10-CM | POA: Diagnosis not present

## 2023-01-16 DIAGNOSIS — M7542 Impingement syndrome of left shoulder: Secondary | ICD-10-CM | POA: Diagnosis not present

## 2023-01-23 ENCOUNTER — Ambulatory Visit: Payer: Medicare PPO | Admitting: Family

## 2023-01-23 ENCOUNTER — Encounter: Payer: Self-pay | Admitting: Family

## 2023-01-23 VITALS — BP 126/70 | HR 71 | Temp 97.5°F | Ht 60.0 in | Wt 148.6 lb

## 2023-01-23 DIAGNOSIS — G250 Essential tremor: Secondary | ICD-10-CM

## 2023-01-23 DIAGNOSIS — R413 Other amnesia: Secondary | ICD-10-CM

## 2023-01-23 DIAGNOSIS — M5137 Other intervertebral disc degeneration, lumbosacral region: Secondary | ICD-10-CM

## 2023-01-23 DIAGNOSIS — G8929 Other chronic pain: Secondary | ICD-10-CM | POA: Diagnosis not present

## 2023-01-23 DIAGNOSIS — M545 Low back pain, unspecified: Secondary | ICD-10-CM | POA: Diagnosis not present

## 2023-01-23 DIAGNOSIS — N939 Abnormal uterine and vaginal bleeding, unspecified: Secondary | ICD-10-CM

## 2023-01-23 DIAGNOSIS — F325 Major depressive disorder, single episode, in full remission: Secondary | ICD-10-CM | POA: Diagnosis not present

## 2023-01-23 MED ORDER — GABAPENTIN 300 MG PO CAPS: 300.0000 mg | ORAL_CAPSULE | Freq: Every evening | ORAL | 3 refills | Status: DC

## 2023-01-23 MED ORDER — GABAPENTIN 100 MG PO CAPS: ORAL_CAPSULE | ORAL | 3 refills | Status: DC

## 2023-01-23 NOTE — Assessment & Plan Note (Signed)
Chronic, stable.  Continue Luvox 100 mg every morning, 50 mg qpm.  She is no longer on Wellbutrin

## 2023-01-23 NOTE — Progress Notes (Unsigned)
Assessment & Plan:  Memory changes Assessment & Plan: Pending MRI of the brain.  Close follow-up  Orders: -     MR BRAIN W WO CONTRAST; Future  DDD (degenerative disc disease), lumbosacral -     Gabapentin; Take 200mg  PO qam, 200mg  PO midday  Dispense: 120 capsule; Refill: 3  Vaginal bleeding Assessment & Plan: Completely resolved at the time.  Reiterated to patient the importance of letting me know if were to recur   Depression, major, single episode, complete remission Assessment & Plan: Chronic, stable.  Continue Luvox 100 mg every morning, 50 mg qpm.  She is no longer on Wellbutrin   Essential tremor Assessment & Plan: Completely resolved since she discontinued Wellbutrin   Chronic bilateral low back pain without sciatica Assessment & Plan: Chronic, stable.  Continue gabapentin 200mg  qam, 200mg  midday, 300mg  qpm, baclofen 10mg  BID, tramadol 50mg  BID    Other orders -     Gabapentin; Take 1 capsule (300 mg total) by mouth every evening.  Dispense: 90 capsule; Refill: 3     Return precautions given.   Risks, benefits, and alternatives of the medications and treatment plan prescribed today were discussed, and patient expressed understanding.   Education regarding symptom management and diagnosis given to patient on AVS either electronically or printed.  Return in about 3 months (around 04/24/2023).  Rennie Plowman, FNP  Subjective:    Patient ID: Claudia Andersen, female    DOB: 1941/05/09, 82 y.o.   MRN: [id number]  CC: Claudia Andersen is a 82 y.o. female who presents today for follow up.   HPI: She describes feeling forgetful when she was at the grocery store yesterday.  Plans and levels have not noticed memory changes or voiced concerns   No vision changes, HA.   Vaginal bleeding has never recurred.    UA without blood  She is taking gabapentin 200mg  qam, 200mg  midday, 300mg  qpm, baclofen 10mg  BID, tramadol 50mg  BID  She is compliant with luvox 100mg    qam and 50mg  qpm.  She feels regimen is working well for her No longer on Wellbutrin  Tremor resolved  Allergies: Patient has no known allergies. Current Outpatient Medications on File Prior to Visit  Medication Sig Dispense Refill   atorvastatin (LIPITOR) 10 MG tablet Take 1 tablet (10 mg total) by mouth daily. 90 tablet 3   baclofen (LIORESAL) 10 MG tablet Take 1 tablet (10 mg total) by mouth 3 (three) times daily as needed for muscle spasms. 90 each 3   Biotin 2500 MCG CAPS Take 2,500 mcg by mouth daily.     Clotrimazole 1 % OINT Apply 1 application topically 2 (two) times daily. 56.7 g 1   denosumab (PROLIA) 60 MG/ML SOSY injection Inject 60 mg into the skin every 6 (six) months.     diclofenac Sodium (VOLTAREN) 1 % GEL Apply 4 g topically 4 (four) times daily. (Patient taking differently: Apply 4 g topically 4 (four) times daily as needed.) 100 g 1   Fluticasone Furoate (FLONASE SENSIMIST NA) Place 1 spray into both nostrils daily as needed (allergies).     fluvoxaMINE (LUVOX) 100 MG tablet Take 1 tablet (100 mg total) by mouth daily. 90 tablet 1   fluvoxaMINE (LUVOX) 50 MG tablet Take 1 tablet (50 mg total) by mouth at bedtime. Take with 100mg  luvox daily 90 tablet 1   hydrochlorothiazide (MICROZIDE) 12.5 MG capsule Take 1 capsule (12.5 mg total) by mouth daily as needed. 30 capsule 3  Lactobacillus (PROBIOTIC ACIDOPHILUS PO) Take 1 tablet by mouth daily.     mupirocin ointment (BACTROBAN) 2 % Apply 1 application topically 2 (two) times daily. 22 g 1   pantoprazole (PROTONIX) 40 MG tablet TAKE ONE TABLET (40 MG) BY MOUTH EVERY DAY 1 HOUR BEFORE BREAKFAST 90 tablet 1   PREVIDENT 5000 PLUS 1.1 % CREA dental cream Take by mouth in the morning and at bedtime.     traMADol (ULTRAM) 50 MG tablet Take 1 tablet (50 mg total) by mouth every 12 (twelve) hours as needed. 30 tablet 2   traZODone (DESYREL) 50 MG tablet Take 2 tablets (100 mg total) by mouth at bedtime. 180 tablet 3   triamcinolone  (KENALOG) 0.025 % cream Apply 1 application topically 2 (two) times daily. 15 g 1   valACYclovir (VALTREX) 1000 MG tablet TAKE TWO TABLETS BY MOUTH EVERY 12 HOURSFOR 1 DAY. **START AS SOON AS POSSIBLE AFTER SYMPTOM ONSET** 6 tablet 1   No current facility-administered medications on file prior to visit.    Review of Systems  Constitutional:  Negative for chills and fever.  Respiratory:  Negative for cough.   Cardiovascular:  Negative for chest pain and palpitations.  Gastrointestinal:  Negative for nausea and vomiting.  Genitourinary:  Negative for vaginal bleeding.  Neurological:  Negative for tremors.      Objective:    BP 126/70   Pulse 71   Temp (!) 97.5 F (36.4 C) (Oral)   Ht 5' (1.524 m)   Wt 148 lb 9.6 oz (67.4 kg)   SpO2 98%   BMI 29.02 kg/m  BP Readings from Last 3 Encounters:  01/23/23 126/70  11/30/22 126/78  11/09/22 128/80   Wt Readings from Last 3 Encounters:  01/23/23 148 lb 9.6 oz (67.4 kg)  12/16/22 153 lb 3.2 oz (69.5 kg)  11/30/22 154 lb (69.9 kg)    Physical Exam Vitals reviewed.  Constitutional:      Appearance: She is well-developed.  Eyes:     Conjunctiva/sclera: Conjunctivae normal.  Cardiovascular:     Rate and Rhythm: Normal rate and regular rhythm.     Pulses: Normal pulses.     Heart sounds: Normal heart sounds.  Pulmonary:     Effort: Pulmonary effort is normal.     Breath sounds: Normal breath sounds. No wheezing, rhonchi or rales.  Skin:    General: Skin is warm and dry.  Neurological:     Mental Status: She is alert.  Psychiatric:        Speech: Speech normal.        Behavior: Behavior normal.        Thought Content: Thought content normal.

## 2023-01-23 NOTE — Assessment & Plan Note (Signed)
Completely resolved at the time.  Reiterated to patient the importance of letting me know if were to recur

## 2023-01-23 NOTE — Assessment & Plan Note (Signed)
Completely resolved since she discontinued Wellbutrin

## 2023-01-24 ENCOUNTER — Telehealth: Payer: Self-pay | Admitting: Family

## 2023-01-24 NOTE — Assessment & Plan Note (Signed)
Pending MRI of the brain.  Close follow-up

## 2023-01-24 NOTE — Telephone Encounter (Signed)
Lft pt vm to call ofc to sch MRI. thanks 

## 2023-01-24 NOTE — Assessment & Plan Note (Signed)
Chronic, stable.  Continue gabapentin 200mg  qam, 200mg  midday, 300mg  qpm, baclofen 10mg  BID, tramadol 50mg  BID

## 2023-01-24 NOTE — Patient Instructions (Signed)
I have ordered MRI of the brain.    Let us know if you dont hear back within a week in regards to an appointment being scheduled.   So that you are aware, if you are [facility] MyChart user , please pay attention to your MyChart messages as you may receive a MyChart message with a phone number to call and schedule this test/appointment own your own from our referral coordinator. This is a new process so I do not want you to miss this message.  If you are not a MyChart user, you will receive a phone call.

## 2023-01-26 ENCOUNTER — Telehealth: Payer: Self-pay | Admitting: Family

## 2023-01-26 NOTE — Telephone Encounter (Signed)
Lft pt vm to call ofc to sch MRI. thanks 

## 2023-02-01 DIAGNOSIS — M7542 Impingement syndrome of left shoulder: Secondary | ICD-10-CM | POA: Diagnosis not present

## 2023-02-01 DIAGNOSIS — M65832 Other synovitis and tenosynovitis, left forearm: Secondary | ICD-10-CM | POA: Diagnosis not present

## 2023-02-01 DIAGNOSIS — M25532 Pain in left wrist: Secondary | ICD-10-CM | POA: Diagnosis not present

## 2023-02-02 NOTE — Telephone Encounter (Signed)
F/u   Rtn call back to the office.

## 2023-02-03 ENCOUNTER — Telehealth: Payer: Self-pay | Admitting: Family

## 2023-02-03 NOTE — Telephone Encounter (Signed)
Returned pt call to sch MRI.thanks

## 2023-02-07 ENCOUNTER — Telehealth: Payer: Self-pay | Admitting: Family

## 2023-02-07 NOTE — Telephone Encounter (Signed)
Lft pt vm to call ofc to sch MRI. thanks 

## 2023-02-08 ENCOUNTER — Encounter: Payer: Self-pay | Admitting: Family

## 2023-02-15 DIAGNOSIS — M7061 Trochanteric bursitis, right hip: Secondary | ICD-10-CM | POA: Diagnosis not present

## 2023-02-19 NOTE — Progress Notes (Unsigned)
MRN : [id number]  Claudia Andersen is a 82 y.o. ([birth year]) female who presents with chief complaint of legs swell.  History of Present Illness:   The patient returns to the office for followup evaluation regarding leg swelling.  The swelling has persisted but with the lymph pump is under much, much better controlled. The pain associated with swelling is decreased. There have not been any interval development of a ulcerations or wounds.   The patient denies problems with the pump, noting it is working well and the leggings are in good condition.   Since the previous visit the patient has been wearing graduated compression stockings and using the lymph pump on a routine basis and  has noted significant improvement in the lymphedema.    Patient stated the lymph pump has been helpful with the treatment of the lymphedema.    No outpatient medications have been marked as taking for the 02/20/23 encounter (Appointment) with Gilda Crease, Latina Craver, MD.    Past Medical History:  Diagnosis Date   Ankle fracture, bimalleolar, closed, right, initial encounter 10/26/2018   d/t fall 10/26/18   Arthritis    generalized   Back abrasion    CRUSHED VERTEBRAE   Cataract    bilateral sx    Closed fracture of shaft of fibula 05/11/2020   Closed right radial fracture 10/26/2018   s/p a fall   Compression of lumbar vertebra (HCC)    L1 and L4 (from fall November 05, 2018)   Degenerative disc disease, lumbar    Depression    hx of-on meds   Gallstones    GERD (gastroesophageal reflux disease)    on meds   Migraines    Osteoporosis    on Prolia inj   Pulmonary emboli (HCC)    history of emboli    Past Surgical History:  Procedure Laterality Date   ABDOMINAL HYSTERECTOMY     APPENDECTOMY     CATARACT EXTRACTION W/PHACO Right 03/27/2018   Procedure: CATARACT EXTRACTION PHACO AND INTRAOCULAR LENS PLACEMENT (IOC);  Surgeon: Galen Manila, MD;  Location: [facility];  Service: Ophthalmology;   Laterality: Right;  [place] 00:33 AP% 15.3 CDE 5.18 Fluid pack lot # [id number]   CATARACT EXTRACTION W/PHACO Left 06/26/2018   Procedure: CATARACT EXTRACTION PHACO AND INTRAOCULAR LENS PLACEMENT (IOC);  Surgeon: Galen Manila, MD;  Location: [facility];  Service: Ophthalmology;  Laterality: Left;  [place] 00:38 AP% 15.2 CDE 5.80 Fluid pack lot # [id number]   FRACTURE SURGERY Right    RIGHT RADIAL wrist   METATARSAL HEAD EXCISION Right 04/01/2021   Procedure: METATARSAL HEAD EXCISION- Tailors Exostectomy -Right;  Surgeon: Rosetta Posner, DPM;  Location: [facility];  Service: Podiatry;  Laterality: Right;  CANNOT arrive before 9:30 Anesthesia- Mac + local   TONSILLECTOMY AND ADENOIDECTOMY     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   Procedure: TOTAL HIP ARTHROPLASTY ANTERIOR APPROACH;  Surgeon: Lyndle Herrlich, MD;  Location: [facility];  Service: Orthopedics;  Laterality: Right;   TOTAL KNEE ARTHROPLASTY Left    TRIPLE SUBTALAR FUSION     WISDOM TOOTH EXTRACTION      Social History Social History   Tobacco Use   Smoking status: Former    Packs/day: 0.50    Years: 22.00    Additional pack years: 0.00    Total pack years: 11.00    Types: Cigarettes    Quit date: 1982    Years since quitting: 42.3   Smokeless tobacco: Never  Vaping Use   Vaping Use: Never used  Substance Use Topics   Alcohol use: Yes    Alcohol/week: 10.0 standard drinks of alcohol    Types: 10 Standard drinks or equivalent per week   Drug use: Never    Family History Family History  Problem Relation Age of Onset   Arthritis Mother    Stroke Mother 97   Hemachromatosis Mother    Heart disease Father 45   Pneumonia Maternal Grandmother    Heart disease Maternal Grandfather    Heart disease Paternal Grandmother    Heart disease Paternal Grandfather    Breast cancer Neg Hx    Colon cancer Neg Hx    Colon polyps Neg Hx    Esophageal cancer Neg Hx    Rectal cancer Neg Hx    Stomach cancer Neg Hx     No  Known Allergies   REVIEW OF SYSTEMS (Negative unless checked)  Constitutional: [] Weight loss  [] Fever  [] Chills Cardiac: [] Chest pain   [] Chest pressure   [] Palpitations   [] Shortness of breath when laying flat   [] Shortness of breath with exertion. Vascular:  [] Pain in legs with walking   [x] Pain in legs with standing  [] History of DVT   [] Phlebitis   [x] Swelling in legs   [] Varicose veins   [] Non-healing ulcers Pulmonary:   [] Uses home oxygen   [] Productive cough   [] Hemoptysis   [] Wheeze  [] COPD   [] Asthma Neurologic:  [] Dizziness   [] Seizures   [] History of stroke   [] History of TIA  [] Aphasia   [] Vissual changes   [] Weakness or numbness in arm   [] Weakness or numbness in leg Musculoskeletal:   [] Joint swelling   [x] Joint pain   [] Low back pain Hematologic:  [] Easy bruising  [] Easy bleeding   [] Hypercoagulable state   [] Anemic Gastrointestinal:  [] Diarrhea   [] Vomiting  [x] Gastroesophageal reflux/heartburn   [] Difficulty swallowing. Genitourinary:  [] Chronic kidney disease   [] Difficult urination  [] Frequent urination   [] Blood in urine Skin:  [] Rashes   [] Ulcers  Psychological:  [] History of anxiety   []  History of major depression.  Physical Examination  There were no vitals filed for this visit. There is no height or weight on file to calculate BMI. Gen: WD/WN, NAD Head: [place]/AT, No temporalis wasting.  Ear/Nose/Throat: Hearing grossly intact, nares w/o erythema or drainage, pinna without lesions Eyes: PER, EOMI, sclera nonicteric.  Neck: Supple, no gross masses.  No JVD.  Pulmonary:  Good air movement, no audible wheezing, no use of accessory muscles.  Cardiac: RRR, precordium not hyperdynamic. Vascular:  scattered varicosities present bilaterally.  Mild venous stasis changes to the legs bilaterally.  3-4+ soft pitting edema, CEAP [id number]  Vessel Right Left  Radial Palpable Palpable  Gastrointestinal: soft, non-distended. No guarding/no peritoneal signs.  Musculoskeletal: M/S  5/5 throughout.  No deformity.  Neurologic: CN 2-12 intact. Pain and light touch intact in extremities.  Symmetrical.  Speech is fluent. Motor exam as listed above. Psychiatric: Judgment intact, Mood & affect appropriate for pt's clinical situation. Dermatologic: Venous rashes no ulcers noted.  No changes consistent with cellulitis. Lymph : No lichenification or skin changes of chronic lymphedema.  CBC Lab Results  Component Value Date   WBC 6.8 12/16/2022   HGB 12.0 12/16/2022   HCT 35.3 (L) 12/16/2022   MCV 98.3 12/16/2022   PLT 239.0 12/16/2022    BMET    Component Value Date/Time   NA 139 08/17/2022 1211   K 4.2 08/17/2022 1211  CL 103 08/17/2022 1211   CO2 31 08/17/2022 1211   GLUCOSE 83 08/17/2022 1211   BUN 21 08/17/2022 1211   CREATININE 0.79 08/17/2022 1211   CALCIUM 9.0 08/17/2022 1211   GFRNONAA 60 (L) 11/17/2020 1156   GFRAA >60 05/08/2020 1836   CrCl cannot be calculated (Patient's most recent lab result is older than the maximum 21 days allowed.).  COAG Lab Results  Component Value Date   INR 1.1 (H) 11/26/2018    Radiology No results found.   Assessment/Plan There are no diagnoses linked to this encounter.   Levora Dredge, MD  02/19/2023 12:58 PM

## 2023-02-20 ENCOUNTER — Ambulatory Visit (INDEPENDENT_AMBULATORY_CARE_PROVIDER_SITE_OTHER): Payer: Medicare PPO | Admitting: Vascular Surgery

## 2023-02-20 ENCOUNTER — Encounter (INDEPENDENT_AMBULATORY_CARE_PROVIDER_SITE_OTHER): Payer: Self-pay | Admitting: Vascular Surgery

## 2023-02-20 VITALS — BP 127/78 | HR 70 | Resp 18 | Ht 60.0 in | Wt 144.8 lb

## 2023-02-20 DIAGNOSIS — I890 Lymphedema, not elsewhere classified: Secondary | ICD-10-CM | POA: Diagnosis not present

## 2023-02-20 DIAGNOSIS — K219 Gastro-esophageal reflux disease without esophagitis: Secondary | ICD-10-CM | POA: Diagnosis not present

## 2023-02-20 DIAGNOSIS — E785 Hyperlipidemia, unspecified: Secondary | ICD-10-CM

## 2023-02-20 DIAGNOSIS — I10 Essential (primary) hypertension: Secondary | ICD-10-CM | POA: Diagnosis not present

## 2023-02-23 ENCOUNTER — Other Ambulatory Visit: Payer: Medicare PPO

## 2023-02-24 ENCOUNTER — Ambulatory Visit
Admission: RE | Admit: 2023-02-24 | Discharge: 2023-02-24 | Disposition: A | Discharge status: Home / Self Care | Payer: Medicare PPO | Source: Ambulatory Visit | Attending: Family | Admitting: Family

## 2023-02-24 DIAGNOSIS — G319 Degenerative disease of nervous system, unspecified: Secondary | ICD-10-CM | POA: Diagnosis not present

## 2023-02-24 DIAGNOSIS — R413 Other amnesia: Secondary | ICD-10-CM | POA: Insufficient documentation

## 2023-02-24 MED ORDER — GADOBUTROL 1 MMOL/ML IV SOLN
6.0000 mL | Freq: Once | INTRAVENOUS | Status: AC | PRN
  Administered 2023-02-24: 6 mL via INTRAVENOUS

## 2023-03-01 ENCOUNTER — Encounter: Payer: Self-pay | Admitting: Family

## 2023-03-10 ENCOUNTER — Ambulatory Visit: Payer: Medicare PPO | Admitting: Family

## 2023-03-10 VITALS — BP 112/64 | HR 64 | Temp 98.4°F | Ht 60.0 in | Wt 142.4 lb

## 2023-03-10 DIAGNOSIS — R5381 Other malaise: Secondary | ICD-10-CM | POA: Diagnosis not present

## 2023-03-10 DIAGNOSIS — R197 Diarrhea, unspecified: Secondary | ICD-10-CM | POA: Diagnosis not present

## 2023-03-10 NOTE — Progress Notes (Signed)
Assessment & Plan:  Diarrhea, unspecified type Assessment & Plan: Overall weight is stable. Down 3 lbs. No alarm features at this time.  Question R/t to food intake or IBS. Discussed artificial sugars and diet sodas as well as fruit juice which could be aggravating loose stool.  We discussed  GrapeNuts.  We discussed moderation or elimination of artificial sugar, diet soda and juice may be prudent.  Advised her to decrease portion size of GrapeNuts or to move to lunch versus breakfast.  She will let me know how she is doing.  Consider metamucil.    Physical deconditioning Assessment & Plan: Imbalance.  Not associated with joint pain, neuropathy, vision changes.  Reassuring MRI of the brain. encouraged patient to start water aerobics in addition to flexibility class.      Return precautions given.   Risks, benefits, and alternatives of the medications and treatment plan prescribed today were discussed, and patient expressed understanding.   Education regarding symptom management and diagnosis given to patient on AVS either electronically or printed.  Return in about 3 months (around 06/10/2023).  Rennie Plowman, FNP  Subjective:    Patient ID: Claudia Andersen, female    DOB: April 02, 1941, 82 y.o.   MRN: [id number]  CC: Claudia Andersen is a 82 y.o. female who presents today for follow up.   HPI: She complains of episodic liquid brown diarrhea x 4 weeks, comes and goes.   She describes brown diarrhea will occur after breakfast in the will not occur the rest of the day.  It does not happen every day.  It is most likely to occur with a bowl of great nuts in strawberry juice.  No diarrhea after she eats eggs for breakfast.   No caffeine  She drinks diet tonic , diet ginger ale, with some blueberry juice with more juice and gingerale.   Pattern is after breakfast  Appetite is good.   No associated fever, abdominal pain, constipation   No metamucil, laxatives  She is on  probiotics , protonix.   Endorses increased burping.  She is taking Beano, Gas-X with temporary relief  She limits lactose.   She is taking flexibility class at [facility]. She notices imbalance more when she is on one foot , turning 'without thinking'.   She walked half mile this week while pushing her friend in wheel chair without any imbalance.   Not associated with dizziness, numbness, knee pain, vision changes.   Wears supportive shoes.    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 Mild generalized parenchymal atrophy.   Allergies: Nsaids Current Outpatient Medications on File Prior to Visit  Medication Sig Dispense Refill   atorvastatin (LIPITOR) 10 MG tablet Take 1 tablet (10 mg total) by mouth daily. 90 tablet 3   baclofen (LIORESAL) 10 MG tablet Take 1 tablet (10 mg total) by mouth 3 (three) times daily as needed for muscle spasms. 90 each 3   Biotin 2500 MCG CAPS Take 2,500 mcg by mouth daily.     Calcium Carb-Cholecalciferol (CALCIUM 600 + D PO) Take 1 tablet by mouth daily.     denosumab (PROLIA) 60 MG/ML SOSY injection Inject 60 mg into the skin every 6 (six) months.     diclofenac Sodium (VOLTAREN) 1 % GEL Apply 4 g topically 4 (four) times daily. (Patient taking differently: Apply 4 g topically 4 (four) times daily as needed.) 100  g 1   Fluticasone Furoate (FLONASE SENSIMIST NA) Place 1 spray into both nostrils daily as needed (allergies).     fluvoxaMINE (LUVOX) 100 MG tablet Take 1 tablet (100 mg total) by mouth daily. 90 tablet 1   fluvoxaMINE (LUVOX) 50 MG tablet Take 1 tablet (50 mg total) by mouth at bedtime. Take with 100mg  luvox daily 90 tablet 1   gabapentin (NEURONTIN) 100 MG capsule Take 200mg  PO qam, 200mg  PO midday 120 capsule 3   gabapentin (NEURONTIN) 300 MG capsule Take 1 capsule (300 mg total) by mouth every evening. 90 capsule 3    Homeopathic Products (ARNICA EX) Apply 1 Application topically as needed (apply to broken skin as needed.).     hydrochlorothiazide (MICROZIDE) 12.5 MG capsule Take 1 capsule (12.5 mg total) by mouth daily as needed. 30 capsule 3   Lactobacillus (PROBIOTIC ACIDOPHILUS PO) Take 1 tablet by mouth daily.     lidocaine (LIDODERM) 5 % Place 1 patch onto the skin as needed (1 every 24 hours). Remove & Discard patch within 12 hours or as directed by MD     Multiple Vitamin (MULTIVITAMIN) tablet Take 1 tablet by mouth daily.     Multiple Vitamins-Minerals (ICAPS AREDS FORMULA PO) Take 1 tablet by mouth 2 (two) times daily.     mupirocin ointment (BACTROBAN) 2 % Apply 1 application topically 2 (two) times daily. 22 g 1   pantoprazole (PROTONIX) 40 MG tablet TAKE ONE TABLET (40 MG) BY MOUTH EVERY DAY 1 HOUR BEFORE BREAKFAST 90 tablet 1   traMADol (ULTRAM) 50 MG tablet Take 1 tablet (50 mg total) by mouth every 12 (twelve) hours as needed. 30 tablet 2   traZODone (DESYREL) 50 MG tablet Take 2 tablets (100 mg total) by mouth at bedtime. 180 tablet 3   triamcinolone (KENALOG) 0.025 % cream Apply 1 application topically 2 (two) times daily. 15 g 1   valACYclovir (VALTREX) 1000 MG tablet TAKE TWO TABLETS BY MOUTH EVERY 12 HOURSFOR 1 DAY. **START AS SOON AS POSSIBLE AFTER SYMPTOM ONSET** 6 tablet 1   Clotrimazole 1 % OINT Apply 1 application topically 2 (two) times daily. (Patient not taking: Reported on 03/10/2023) 56.7 g 1   No current facility-administered medications on file prior to visit.    Review of Systems  Constitutional:  Negative for chills and fever.  Respiratory:  Negative for cough.   Cardiovascular:  Negative for chest pain and palpitations.  Gastrointestinal:  Positive for diarrhea. Negative for abdominal pain, anal bleeding, constipation, nausea and vomiting.      Objective:    BP 112/64   Pulse 64   Temp 98.4 F (36.9 C)   Ht 5' (1.524 m)   Wt 142 lb 6.4 oz (64.6 kg)   SpO2 95%    BMI 27.81 kg/m  BP Readings from Last 3 Encounters:  03/10/23 112/64  02/20/23 127/78  01/23/23 126/70   Wt Readings from Last 3 Encounters:  03/10/23 142 lb 6.4 oz (64.6 kg)  02/20/23 144 lb 12.8 oz (65.7 kg)  01/23/23 148 lb 9.6 oz (67.4 kg)    Physical Exam Vitals reviewed.  Constitutional:      Appearance: Normal appearance. She is well-developed.  Eyes:     Conjunctiva/sclera: Conjunctivae normal.  Cardiovascular:     Rate and Rhythm: Normal rate and regular rhythm.     Pulses: Normal pulses.     Heart sounds: Normal heart sounds.  Pulmonary:     Effort: Pulmonary effort is normal.  Breath sounds: Normal breath sounds. No wheezing, rhonchi or rales.  Abdominal:     General: Bowel sounds are normal. There is no distension.     Palpations: Abdomen is soft. Abdomen is not rigid. There is no fluid wave or mass.     Tenderness: There is no abdominal tenderness. There is no guarding or rebound.  Skin:    General: Skin is warm and dry.  Neurological:     Mental Status: She is alert.  Psychiatric:        Speech: Speech normal.        Behavior: Behavior normal.        Thought Content: Thought content normal.

## 2023-03-10 NOTE — Assessment & Plan Note (Signed)
Imbalance.  Not associated with joint pain, neuropathy, vision changes.  Reassuring MRI of the brain. encouraged patient to start water aerobics in addition to flexibility class.

## 2023-03-10 NOTE — Patient Instructions (Addendum)
Trial decrease/ eliminate blueberry juice or diet drinks as sugar as well as artificial sugar can cause loose stools and diarrhea.  I would change GrapeNuts to lunchtime to see if he still have diarrhea after.  You may have to decrease dose or eliminate altogether  We could consider Metamucil which will bulk stool.  I enncourage you to get back in water aerobics to help with balance.  Please let me know how else I can support you.

## 2023-03-10 NOTE — Assessment & Plan Note (Addendum)
Overall weight is stable. Down 3 lbs. No alarm features at this time.  Question R/t to food intake or IBS. Discussed artificial sugars and diet sodas as well as fruit juice which could be aggravating loose stool.  We discussed  GrapeNuts.  We discussed moderation or elimination of artificial sugar, diet soda and juice may be prudent.  Advised her to decrease portion size of GrapeNuts or to move to lunch versus breakfast.  She will let me know how she is doing.  Consider metamucil.

## 2023-03-31 ENCOUNTER — Other Ambulatory Visit: Payer: Self-pay | Admitting: Family

## 2023-03-31 DIAGNOSIS — Z1231 Encounter for screening mammogram for malignant neoplasm of breast: Secondary | ICD-10-CM

## 2023-04-05 DIAGNOSIS — S39012A Strain of muscle, fascia and tendon of lower back, initial encounter: Secondary | ICD-10-CM | POA: Diagnosis not present

## 2023-04-05 DIAGNOSIS — M47896 Other spondylosis, lumbar region: Secondary | ICD-10-CM | POA: Diagnosis not present

## 2023-04-11 DIAGNOSIS — M1711 Unilateral primary osteoarthritis, right knee: Secondary | ICD-10-CM | POA: Diagnosis not present

## 2023-04-11 DIAGNOSIS — M7051 Other bursitis of knee, right knee: Secondary | ICD-10-CM | POA: Diagnosis not present

## 2023-04-12 DIAGNOSIS — M810 Age-related osteoporosis without current pathological fracture: Secondary | ICD-10-CM | POA: Diagnosis not present

## 2023-04-13 ENCOUNTER — Other Ambulatory Visit: Payer: Self-pay | Admitting: Family

## 2023-04-13 DIAGNOSIS — F325 Major depressive disorder, single episode, in full remission: Secondary | ICD-10-CM

## 2023-04-22 ENCOUNTER — Other Ambulatory Visit: Payer: Self-pay | Admitting: Family

## 2023-04-22 DIAGNOSIS — F325 Major depressive disorder, single episode, in full remission: Secondary | ICD-10-CM

## 2023-04-24 ENCOUNTER — Encounter: Payer: Self-pay | Admitting: Family

## 2023-04-24 ENCOUNTER — Ambulatory Visit: Payer: Medicare PPO | Admitting: Family

## 2023-04-24 VITALS — BP 114/64 | HR 64 | Temp 98.0°F | Resp 16 | Ht 60.0 in | Wt 144.4 lb

## 2023-04-24 DIAGNOSIS — R5381 Other malaise: Secondary | ICD-10-CM | POA: Diagnosis not present

## 2023-04-24 DIAGNOSIS — I10 Essential (primary) hypertension: Secondary | ICD-10-CM

## 2023-04-24 DIAGNOSIS — E785 Hyperlipidemia, unspecified: Secondary | ICD-10-CM

## 2023-04-24 DIAGNOSIS — M810 Age-related osteoporosis without current pathological fracture: Secondary | ICD-10-CM | POA: Diagnosis not present

## 2023-04-24 DIAGNOSIS — R197 Diarrhea, unspecified: Secondary | ICD-10-CM | POA: Diagnosis not present

## 2023-04-24 LAB — BASIC METABOLIC PANEL
BUN: 19 mg/dL (ref 6–23)
CO2: 29 mEq/L (ref 19–32)
Calcium: 9.2 mg/dL (ref 8.4–10.5)
Chloride: 104 mEq/L (ref 96–112)
Creatinine, Ser: 0.81 mg/dL (ref 0.40–1.20)
GFR: 68.32 mL/min (ref >60.00)
Glucose, Bld: 82 mg/dL (ref 70–99)
Potassium: 4.2 mEq/L (ref 3.5–5.1)
Sodium: 141 mEq/L (ref 135–145)

## 2023-04-24 LAB — VITAMIN D 25 HYDROXY (VIT D DEFICIENCY, FRACTURES): VITD: 77.74 ng/mL (ref 30.00–100.00)

## 2023-04-24 LAB — LIPID PANEL
Cholesterol: 163 mg/dL (ref 0–200)
HDL: 85.6 mg/dL (ref >39.00)
LDL Cholesterol: 68 mg/dL (ref 0–99)
NonHDL: 77.62
Total CHOL/HDL Ratio: 2
Triglycerides: 46 mg/dL (ref 0.0–149.0)
VLDL: 9.2 mg/dL (ref 0.0–40.0)

## 2023-04-24 MED ORDER — HYDROCHLOROTHIAZIDE 12.5 MG PO CAPS
12.5000 mg | ORAL_CAPSULE | Freq: Every day | ORAL | 3 refills | Status: DC | PRN
Start: 2023-04-24 — End: 2024-07-18

## 2023-04-24 NOTE — Assessment & Plan Note (Signed)
Well-controlled today without hydrochlorothiazide 12.5 mg.  She will continue use hydrochlorothiazide 12.5 mg as needed .

## 2023-04-24 NOTE — Assessment & Plan Note (Signed)
Resolved Will monitor 

## 2023-04-24 NOTE — Assessment & Plan Note (Signed)
Chronic, stable.  Patient will continue classes at [facility] and physical exercise.  Will monitor

## 2023-04-24 NOTE — Progress Notes (Signed)
Assessment & Plan:  Hypertension, unspecified type Assessment & Plan: Well-controlled today without hydrochlorothiazide 12.5 mg.  She will continue use hydrochlorothiazide 12.5 mg as needed .   Orders: -     hydroCHLOROthiazide; Take 1 capsule (12.5 mg total) by mouth daily as needed.  Dispense: 90 capsule; Refill: 3  Osteoporosis, unspecified osteoporosis type, unspecified pathological fracture presence -     Basic metabolic panel -     VITAMIN D 25 Hydroxy (Vit-D Deficiency, Fractures)  Hyperlipidemia, unspecified hyperlipidemia type -     Lipid panel  Physical deconditioning Assessment & Plan: Chronic, stable.  Patient will continue classes at [facility] and physical exercise.  Will monitor   Diarrhea, unspecified type Assessment & Plan: Resolved.  Will monitor      Return precautions given.   Risks, benefits, and alternatives of the medications and treatment plan prescribed today were discussed, and patient expressed understanding.   Education regarding symptom management and diagnosis given to patient on AVS either electronically or printed.  Return in about 6 months (around 10/25/2023).  Rennie Plowman, FNP  Subjective:    Patient ID: Claudia Andersen, female    DOB: Aug 30, 1941, 82 y.o.   MRN: [id number]  CC: Claudia Andersen is a 82 y.o. female who presents today for follow up.   HPI: Feels well today No complaints  She has been gardening today She goes to the gym most days and using bosu ball for balance. She continues to go to flexabiluty class at [facility].     Diarrhea has resolved.   Mammogram is scheduled Allergies: Nsaids Current Outpatient Medications on File Prior to Visit  Medication Sig Dispense Refill   atorvastatin (LIPITOR) 10 MG tablet Take 1 tablet (10 mg total) by mouth daily. 90 tablet 3   baclofen (LIORESAL) 10 MG tablet Take 1 tablet (10 mg total) by mouth 3 (three) times daily as needed for muscle spasms. 90 each 3   Biotin 2500 MCG  CAPS Take 2,500 mcg by mouth daily.     Calcium Carb-Cholecalciferol (CALCIUM 600 + D PO) Take 1 tablet by mouth daily.     Clotrimazole 1 % OINT Apply 1 application topically 2 (two) times daily. 56.7 g 1   denosumab (PROLIA) 60 MG/ML SOSY injection Inject 60 mg into the skin every 6 (six) months.     diclofenac Sodium (VOLTAREN) 1 % GEL Apply 4 g topically 4 (four) times daily. (Patient taking differently: Apply 4 g topically 4 (four) times daily as needed.) 100 g 1   Fluticasone Furoate (FLONASE SENSIMIST NA) Place 1 spray into both nostrils daily as needed (allergies).     fluvoxaMINE (LUVOX) 100 MG tablet TAKE 1 TABLET BY MOUTH ONCE DAILY 90 tablet 1   fluvoxaMINE (LUVOX) 50 MG tablet Take 1 tablet (50 mg total) by mouth at bedtime. Take with 100mg  luvox daily 90 tablet 1   gabapentin (NEURONTIN) 100 MG capsule Take 200mg  PO qam, 200mg  PO midday 120 capsule 3   gabapentin (NEURONTIN) 300 MG capsule Take 1 capsule (300 mg total) by mouth every evening. 90 capsule 3   Homeopathic Products (ARNICA EX) Apply 1 Application topically as needed (apply to broken skin as needed.).     Lactobacillus (PROBIOTIC ACIDOPHILUS PO) Take 1 tablet by mouth daily.     lidocaine (LIDODERM) 5 % Place 1 patch onto the skin as needed (1 every 24 hours). Remove & Discard patch within 12 hours or as directed by MD  Multiple Vitamin (MULTIVITAMIN) tablet Take 1 tablet by mouth daily.     mupirocin ointment (BACTROBAN) 2 % Apply 1 application topically 2 (two) times daily. 22 g 1   pantoprazole (PROTONIX) 40 MG tablet TAKE ONE TABLET (40 MG) BY MOUTH EVERY DAY 1 HOUR BEFORE BREAKFAST 90 tablet 1   traMADol (ULTRAM) 50 MG tablet Take 1 tablet (50 mg total) by mouth every 12 (twelve) hours as needed. 30 tablet 2   traZODone (DESYREL) 50 MG tablet Take 2 tablets (100 mg total) by mouth at bedtime. 180 tablet 3   triamcinolone (KENALOG) 0.025 % cream Apply 1 application topically 2 (two) times daily. 15 g 1    valACYclovir (VALTREX) 1000 MG tablet TAKE TWO TABLETS BY MOUTH EVERY 12 HOURSFOR 1 DAY. **START AS SOON AS POSSIBLE AFTER SYMPTOM ONSET** 6 tablet 1   Multiple Vitamins-Minerals (ICAPS AREDS FORMULA PO) Take 1 tablet by mouth 2 (two) times daily.     No current facility-administered medications on file prior to visit.    Review of Systems  Constitutional:  Negative for chills, fever and unexpected weight change.  HENT:  Negative for congestion.   Respiratory:  Negative for cough.   Cardiovascular:  Negative for chest pain, palpitations and leg swelling.  Gastrointestinal:  Negative for nausea and vomiting.  Musculoskeletal:  Negative for arthralgias and myalgias.  Skin:  Negative for rash.  Neurological:  Negative for headaches.  Hematological:  Negative for adenopathy.  Psychiatric/Behavioral:  Negative for confusion.       Objective:    BP 114/64   Pulse 64   Temp 98 F (36.7 C)   Resp 16   Ht 5' (1.524 m)   Wt 144 lb 6 oz (65.5 kg)   SpO2 94%   BMI 28.20 kg/m  BP Readings from Last 3 Encounters:  04/24/23 114/64  03/10/23 112/64  02/20/23 127/78   Wt Readings from Last 3 Encounters:  04/24/23 144 lb 6 oz (65.5 kg)  03/10/23 142 lb 6.4 oz (64.6 kg)  02/20/23 144 lb 12.8 oz (65.7 kg)    Physical Exam Vitals reviewed.  Constitutional:      Appearance: She is well-developed.  Eyes:     Conjunctiva/sclera: Conjunctivae normal.  Cardiovascular:     Rate and Rhythm: Normal rate and regular rhythm.     Pulses: Normal pulses.     Heart sounds: Normal heart sounds.  Pulmonary:     Effort: Pulmonary effort is normal.     Breath sounds: Normal breath sounds. No wheezing, rhonchi or rales.  Skin:    General: Skin is warm and dry.  Neurological:     Mental Status: She is alert.  Psychiatric:        Speech: Speech normal.        Behavior: Behavior normal.        Thought Content: Thought content normal.

## 2023-04-28 DIAGNOSIS — M810 Age-related osteoporosis without current pathological fracture: Secondary | ICD-10-CM | POA: Diagnosis not present

## 2023-05-10 ENCOUNTER — Ambulatory Visit: Payer: Medicare PPO | Admitting: Podiatry

## 2023-05-10 ENCOUNTER — Telehealth: Payer: Self-pay | Admitting: Family

## 2023-05-10 ENCOUNTER — Ambulatory Visit (INDEPENDENT_AMBULATORY_CARE_PROVIDER_SITE_OTHER): Payer: Medicare PPO

## 2023-05-10 DIAGNOSIS — L989 Disorder of the skin and subcutaneous tissue, unspecified: Secondary | ICD-10-CM | POA: Diagnosis not present

## 2023-05-10 DIAGNOSIS — M2041 Other hammer toe(s) (acquired), right foot: Secondary | ICD-10-CM

## 2023-05-10 DIAGNOSIS — L600 Ingrowing nail: Secondary | ICD-10-CM

## 2023-05-10 NOTE — Telephone Encounter (Signed)
Pt called in very upset because she's been waiting since 3:15pm for her Wellness phone call. Pt stated that its now 4:08pm and no one one called her to reach out to her for her visit, pt stated she has stuff to do. Please advice.

## 2023-05-10 NOTE — Progress Notes (Signed)
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 female presents with the above complaint.   ROS: Denies fever chills nausea vomit muscle aches pains calf pain back pain chest pain shortness of breath.  Past Medical History:  Diagnosis Date   Ankle fracture, bimalleolar, closed, right, initial encounter 10/26/2018   d/t fall 10/26/18   Arthritis    generalized   Back abrasion    CRUSHED VERTEBRAE   Cataract    bilateral sx    Closed fracture of shaft of fibula 05/11/2020   Closed right radial fracture 10/26/2018   s/p a fall   Compression of lumbar vertebra (HCC)    L1 and L4 (from fall November 05, 2018)   Degenerative disc disease, lumbar    Depression    hx of-on meds   Gallstones    GERD (gastroesophageal reflux disease)    on meds   Migraines    Osteoporosis    on Prolia inj   Pulmonary emboli (HCC)    history of emboli   Past Surgical History:  Procedure Laterality Date   ABDOMINAL HYSTERECTOMY     APPENDECTOMY     CATARACT EXTRACTION W/PHACO Right 03/27/2018   Procedure: CATARACT EXTRACTION PHACO AND INTRAOCULAR LENS PLACEMENT (IOC);  Surgeon: Galen Manila, MD;  Location: [facility];  Service: Ophthalmology;  Laterality: Right;  [place] 00:33 AP% 15.3 CDE 5.18 Fluid pack lot # [id number]   CATARACT EXTRACTION W/PHACO Left 06/26/2018   Procedure: CATARACT EXTRACTION PHACO AND INTRAOCULAR LENS PLACEMENT (IOC);  Surgeon: Galen Manila, MD;  Location: [facility];  Service: Ophthalmology;  Laterality: Left;  [place] 00:38 AP% 15.2 CDE 5.80 Fluid pack lot # [id number]   FRACTURE SURGERY Right    RIGHT RADIAL wrist   METATARSAL HEAD EXCISION Right 04/01/2021   Procedure: METATARSAL HEAD EXCISION- Tailors Exostectomy -Right;  Surgeon: Rosetta Posner, DPM;  Location: [facility];  Service: Podiatry;  Laterality: Right;  CANNOT arrive before 9:30 Anesthesia- Mac + local   TONSILLECTOMY AND ADENOIDECTOMY     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   Procedure: TOTAL HIP ARTHROPLASTY ANTERIOR APPROACH;  Surgeon: Lyndle Herrlich, MD;  Location: [facility];  Service: Orthopedics;  Laterality: Right;   TOTAL KNEE ARTHROPLASTY Left    TRIPLE SUBTALAR FUSION     WISDOM TOOTH EXTRACTION      Current Outpatient Medications:    atorvastatin (LIPITOR) 10 MG tablet, Take 1 tablet (10 mg total) by mouth daily., Disp: 90 tablet, Rfl: 3   baclofen (LIORESAL) 10 MG tablet, Take 1 tablet (10 mg total) by mouth 3 (three) times daily as needed for muscle spasms., Disp: 90 each, Rfl: 3   Biotin 2500 MCG CAPS, Take 2,500 mcg by mouth daily., Disp: , Rfl:    Calcium Carb-Cholecalciferol (CALCIUM 600 + D PO), Take 1 tablet by mouth daily., Disp: , Rfl:    Clotrimazole 1 % OINT, Apply 1 application topically 2 (two) times daily., Disp: 56.7 g, Rfl: 1   denosumab (PROLIA) 60 MG/ML SOSY injection, Inject 60 mg into the skin every 6 (six) months., Disp: , Rfl:    diclofenac Sodium (VOLTAREN) 1 % GEL, Apply 4 g topically 4 (four) times  daily. (Patient taking differently: Apply 4 g topically 4 (four) times daily as needed.), Disp: 100 g, Rfl: 1   Fluticasone Furoate (FLONASE SENSIMIST NA), Place 1 spray into both nostrils daily as needed (allergies)., Disp: , Rfl:    fluvoxaMINE (LUVOX) 100 MG tablet, TAKE 1 TABLET BY MOUTH ONCE DAILY, Disp: 90 tablet, Rfl: 1   fluvoxaMINE (LUVOX) 50 MG tablet, TAKE 1 TABLET BY MOUTH DAILY, Disp: 90 tablet, Rfl: 3   gabapentin (NEURONTIN) 100 MG capsule, Take 200mg  PO qam, 200mg  PO midday, Disp: 120 capsule, Rfl: 3   gabapentin (NEURONTIN) 300 MG capsule, Take 1 capsule (300 mg total) by mouth every evening., Disp: 90 capsule, Rfl: 3   Homeopathic Products (ARNICA EX), Apply 1 Application topically as needed (apply to broken  skin as needed.)., Disp: , Rfl:    hydrochlorothiazide (MICROZIDE) 12.5 MG capsule, Take 1 capsule (12.5 mg total) by mouth daily as needed., Disp: 90 capsule, Rfl: 3   Lactobacillus (PROBIOTIC ACIDOPHILUS PO), Take 1 tablet by mouth daily., Disp: , Rfl:    lidocaine (LIDODERM) 5 %, Place 1 patch onto the skin as needed (1 every 24 hours). Remove & Discard patch within 12 hours or as directed by MD, Disp: , Rfl:    Multiple Vitamin (MULTIVITAMIN) tablet, Take 1 tablet by mouth daily., Disp: , Rfl:    Multiple Vitamins-Minerals (ICAPS AREDS FORMULA PO), Take 1 tablet by mouth 2 (two) times daily., Disp: , Rfl:    mupirocin ointment (BACTROBAN) 2 %, Apply 1 application topically 2 (two) times daily., Disp: 22 g, Rfl: 1   pantoprazole (PROTONIX) 40 MG tablet, TAKE ONE TABLET (40 MG) BY MOUTH EVERY DAY 1 HOUR BEFORE BREAKFAST, Disp: 90 tablet, Rfl: 1   traMADol (ULTRAM) 50 MG tablet, Take 1 tablet (50 mg total) by mouth every 12 (twelve) hours as needed., Disp: 30 tablet, Rfl: 2   traZODone (DESYREL) 50 MG tablet, Take 2 tablets (100 mg total) by mouth at bedtime., Disp: 180 tablet, Rfl: 3   triamcinolone (KENALOG) 0.025 % cream, Apply 1 application topically 2 (two) times daily., Disp: 15 g, Rfl: 1   valACYclovir (VALTREX) 1000 MG tablet, TAKE TWO TABLETS BY MOUTH EVERY 12 HOURSFOR 1 DAY. **START AS SOON AS POSSIBLE AFTER SYMPTOM ONSET**, Disp: 6 tablet, Rfl: 1  Allergies  Allergen Reactions   Nsaids Other (See Comments)    Reflux symptoms   Review of Systems Objective:  There were no vitals filed for this visit.  General: Well developed, nourished, in no acute distress, alert and oriented x3   Dermatological: Skin is warm, dry and supple bilateral. Nails x 10 are well maintained; remaining integument appears unremarkable at this time. There are no open sores, no preulcerative lesions, no rash or signs of infection present.  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  No lesions noted.  She does have some reactive hyperkeratosis medial border of the hallux nail left.  I trimmed that for her today alleviating her symptoms.  There is no ingrown nail at that point.  Vascular: Dorsalis Pedis artery and Posterior Tibial artery pedal pulses are 2/4 bilateral with immedate capillary fill time. Pedal hair growth present. No varicosities and no lower extremity edema present bilateral.   Neruologic: Grossly intact via light touch bilateral. Vibratory intact via tuning fork bilateral. Protective threshold with Semmes Wienstein monofilament intact to all pedal sites bilateral. Patellar and Achilles deep tendon reflexes 2+ bilateral. No Babinski or clonus noted bilateral.  Musculoskeletal: No gross boney pedal deformities bilateral. No pain, crepitus, or limitation noted with foot and ankle range of motion bilateral. Muscular strength 5/5 in all groups tested bilateral.  Fifth ray resection right foot.  Adductovarus rotated fourth toe right foot with tenderness on palpation of the distal phalanx laterally no wounds are noted.  Gait: Unassisted, Nonantalgic.    Radiographs:  Radiographs taken today demonstrate amputation of the fifth ray right foot.  Adductovarus rotated hammertoe deformity fourth right.    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  Benign skin lesion hallux left  Plan: Placed padding silicone fourth toe right debrided reactive hyperkeratotic tissue medial aspect of the hallux and debrided the edge of the nail.     Allice Garro T. [place], [place]

## 2023-05-12 NOTE — Telephone Encounter (Signed)
I tried to call the patient 3 times on the day of her appointment and each time the call went straight to her her voicemail. Messages were left for her to call the office back,  Tried to call patient back this morning and the same thing happened. I have left another message on her voicemail asking her to call the office back and reschedule her appointment. Message left advising that she needs to find out why her calls are going straight to voicemail.

## 2023-05-15 ENCOUNTER — Other Ambulatory Visit: Payer: Self-pay | Admitting: Family

## 2023-05-15 ENCOUNTER — Telehealth (INDEPENDENT_AMBULATORY_CARE_PROVIDER_SITE_OTHER): Payer: Self-pay

## 2023-05-15 ENCOUNTER — Ambulatory Visit
Admission: RE | Admit: 2023-05-15 | Discharge: 2023-05-15 | Disposition: A | Discharge status: Home / Self Care | Payer: Medicare PPO | Source: Ambulatory Visit | Attending: Family | Admitting: Family

## 2023-05-15 DIAGNOSIS — Z1231 Encounter for screening mammogram for malignant neoplasm of breast: Secondary | ICD-10-CM | POA: Diagnosis not present

## 2023-05-15 DIAGNOSIS — M5137 Other intervertebral disc degeneration, lumbosacral region: Secondary | ICD-10-CM

## 2023-05-15 NOTE — Telephone Encounter (Signed)
Tried to call patient again and the call went straight to her voicemail. Asked patient to call the office back to reschedule. Patient was advised that we need to figure out why the call goes straight to her voicemail when we call her.

## 2023-05-15 NOTE — Telephone Encounter (Signed)
Patient called for a prescription for compression hose.  Please advise

## 2023-05-16 NOTE — Telephone Encounter (Signed)
Mailing it out to address on file per patient request

## 2023-05-16 NOTE — Telephone Encounter (Signed)
Rx on your desk, you can ask if the patient wants to pick up or if she wants it sent to her

## 2023-05-16 NOTE — Telephone Encounter (Addendum)
Left patient a voicemail to let me know which she would like to do on the voicemail here

## 2023-05-17 ENCOUNTER — Encounter (INDEPENDENT_AMBULATORY_CARE_PROVIDER_SITE_OTHER): Payer: Self-pay

## 2023-05-24 DIAGNOSIS — M25551 Pain in right hip: Secondary | ICD-10-CM | POA: Diagnosis not present

## 2023-05-24 DIAGNOSIS — M47896 Other spondylosis, lumbar region: Secondary | ICD-10-CM | POA: Diagnosis not present

## 2023-05-25 DIAGNOSIS — M5416 Radiculopathy, lumbar region: Secondary | ICD-10-CM | POA: Diagnosis not present

## 2023-05-25 DIAGNOSIS — M48061 Spinal stenosis, lumbar region without neurogenic claudication: Secondary | ICD-10-CM | POA: Diagnosis not present

## 2023-05-25 DIAGNOSIS — M5136 Other intervertebral disc degeneration, lumbar region: Secondary | ICD-10-CM | POA: Diagnosis not present

## 2023-05-25 DIAGNOSIS — M791 Myalgia, unspecified site: Secondary | ICD-10-CM | POA: Diagnosis not present

## 2023-05-25 DIAGNOSIS — M47896 Other spondylosis, lumbar region: Secondary | ICD-10-CM | POA: Diagnosis not present

## 2023-05-25 DIAGNOSIS — M545 Low back pain, unspecified: Secondary | ICD-10-CM | POA: Diagnosis not present

## 2023-05-25 DIAGNOSIS — M51369 Other intervertebral disc degeneration, lumbar region without mention of lumbar back pain or lower extremity pain: Secondary | ICD-10-CM | POA: Insufficient documentation

## 2023-05-31 DIAGNOSIS — M5416 Radiculopathy, lumbar region: Secondary | ICD-10-CM | POA: Diagnosis not present

## 2023-06-01 DIAGNOSIS — M810 Age-related osteoporosis without current pathological fracture: Secondary | ICD-10-CM | POA: Diagnosis not present

## 2023-06-01 DIAGNOSIS — Z8781 Personal history of (healed) traumatic fracture: Secondary | ICD-10-CM | POA: Diagnosis not present

## 2023-06-12 ENCOUNTER — Encounter: Payer: Self-pay | Admitting: Podiatry

## 2023-06-12 ENCOUNTER — Ambulatory Visit: Payer: Medicare PPO | Admitting: Family

## 2023-06-12 ENCOUNTER — Ambulatory Visit: Payer: Medicare PPO | Admitting: Podiatry

## 2023-06-12 DIAGNOSIS — R269 Unspecified abnormalities of gait and mobility: Secondary | ICD-10-CM | POA: Insufficient documentation

## 2023-06-12 DIAGNOSIS — M5416 Radiculopathy, lumbar region: Secondary | ICD-10-CM | POA: Diagnosis not present

## 2023-06-12 DIAGNOSIS — M79676 Pain in unspecified toe(s): Secondary | ICD-10-CM | POA: Diagnosis not present

## 2023-06-12 DIAGNOSIS — B351 Tinea unguium: Secondary | ICD-10-CM | POA: Diagnosis not present

## 2023-06-13 ENCOUNTER — Ambulatory Visit: Payer: Medicare PPO | Admitting: Family

## 2023-06-13 ENCOUNTER — Encounter: Payer: Self-pay | Admitting: Family

## 2023-06-13 VITALS — BP 130/70 | HR 75 | Temp 98.0°F | Ht 60.0 in | Wt 137.6 lb

## 2023-06-13 DIAGNOSIS — M25551 Pain in right hip: Secondary | ICD-10-CM | POA: Diagnosis not present

## 2023-06-13 DIAGNOSIS — M5137 Other intervertebral disc degeneration, lumbosacral region: Secondary | ICD-10-CM

## 2023-06-13 DIAGNOSIS — K219 Gastro-esophageal reflux disease without esophagitis: Secondary | ICD-10-CM

## 2023-06-13 MED ORDER — PANTOPRAZOLE SODIUM 40 MG PO TBEC: DELAYED_RELEASE_TABLET | ORAL | 3 refills | Status: DC

## 2023-06-13 MED ORDER — GABAPENTIN 100 MG PO CAPS
ORAL_CAPSULE | ORAL | 5 refills | Status: DC
Start: 2023-06-13 — End: 2024-02-21

## 2023-06-13 NOTE — Progress Notes (Signed)
Assessment & Plan:  Right hip pain Assessment & Plan: Reviewed orthopedic consult.  Unable to see MRI lumbar spine.  She has follow-up  with Dr. Lorene Dy next month.  We discussed  resuming previous dose of gabapentin to see if produced moderate improvement of pain. Increase gabapentin 200 mg every morning, gabapentin 200 mg midday and gabapentin 300 mg every afternoon. Consider decrease after potential ESI due to concern of sedation, polypharmacy. Encouraged continued heat.  Counseled on sedating properties of tramadol, methocarbamol and gabapentin.   Gastroesophageal reflux disease, unspecified whether esophagitis present -     Pantoprazole Sodium; TAKE ONE TABLET (40 MG) BY MOUTH EVERY DAY 1 HOUR BEFORE BREAKFAST  Dispense: 90 tablet; Refill: 3  DDD (degenerative disc disease), lumbosacral -     Gabapentin; Take 200mg  PO qam, 200mg  PO midday  Dispense: 120 capsule; Refill: 5     Return precautions given.   Risks, benefits, and alternatives of the medications and treatment plan prescribed today were discussed, and patient expressed understanding.   Education regarding symptom management and diagnosis given to patient on AVS either electronically or printed.  Return in about 3 months (around 09/13/2023).  Rennie Plowman, FNP  Subjective:    Patient ID: [name], female    DOB: [birth year], 82 y.o.   MRN: [id number]  CC: MICALA MILLINER is a 82 y.o. female who presents today for follow up.   HPI: She is most bothered by right low back and hip pain and stiffness.   Worse she begins to stand and improves as she walks.   She is using a walker at home  Denies urinary or fecal incontinence, saddle anesthesia.     Following with orthopedic for right hip,  low back pain; follow-up with Dr. Lorene Dy 06/22/2023 for consideration of ESI.  Given tramadol 50mg  TID  and methocarbamol 500 mg TID and reports no significant relief.  She is using lidocaine patch, heat with temporary  relief  She is no longer on baclofen.   Complaint with gabapentin 200mg  in morning and 300mg  qpm.  She is not sure why she stopped the midday dose.  Thinks that she may have forgotten.  She does not feel excessively sedated on this regimen Allergies: Nsaids Current Outpatient Medications on File Prior to Visit  Medication Sig Dispense Refill   atorvastatin (LIPITOR) 10 MG tablet Take 1 tablet (10 mg total) by mouth daily. 90 tablet 3   Biotin 2500 MCG CAPS Take 2,500 mcg by mouth daily.     Calcium Carb-Cholecalciferol (CALCIUM 600 + D PO) Take 1 tablet by mouth daily.     Clotrimazole 1 % OINT Apply 1 application topically 2 (two) times daily. 56.7 g 1   diclofenac Sodium (VOLTAREN) 1 % GEL Apply 4 g topically 4 (four) times daily. (Patient taking differently: Apply 4 g topically 4 (four) times daily as needed.) 100 g 1   Fluticasone Furoate (FLONASE SENSIMIST NA) Place 1 spray into both nostrils daily as needed (allergies).     fluvoxaMINE (LUVOX) 100 MG tablet TAKE 1 TABLET BY MOUTH ONCE DAILY 90 tablet 1   fluvoxaMINE (LUVOX) 50 MG tablet TAKE 1 TABLET BY MOUTH DAILY 90 tablet 3   gabapentin (NEURONTIN) 300 MG capsule Take 1 capsule (300 mg total) by mouth every evening. 90 capsule 3   Homeopathic Products (ARNICA EX) Apply 1 Application topically as needed (apply to broken skin as needed.).     hydrochlorothiazide (MICROZIDE) 12.5 MG capsule Take 1 capsule (12.5  mg total) by mouth daily as needed. 90 capsule 3   Lactobacillus (PROBIOTIC ACIDOPHILUS PO) Take 1 tablet by mouth daily.     lidocaine (LIDODERM) 5 % Place 1 patch onto the skin as needed (1 every 24 hours). Remove & Discard patch within 12 hours or as directed by MD     methocarbamol (ROBAXIN) 500 MG tablet Take 500 mg by mouth 3 (three) times daily.     Multiple Vitamin (MULTIVITAMIN) tablet Take 1 tablet by mouth daily.     Multiple Vitamins-Minerals (ICAPS AREDS FORMULA PO) Take 1 tablet by mouth 2 (two) times daily.      mupirocin ointment (BACTROBAN) 2 % Apply 1 application topically 2 (two) times daily. 22 g 1   traMADol (ULTRAM) 50 MG tablet Take 1 tablet (50 mg total) by mouth every 12 (twelve) hours as needed. 30 tablet 2   traZODone (DESYREL) 50 MG tablet Take 2 tablets (100 mg total) by mouth at bedtime. 180 tablet 3   triamcinolone (KENALOG) 0.025 % cream Apply 1 application topically 2 (two) times daily. 15 g 1   valACYclovir (VALTREX) 1000 MG tablet TAKE TWO TABLETS BY MOUTH EVERY 12 HOURSFOR 1 DAY. **START AS SOON AS POSSIBLE AFTER SYMPTOM ONSET** 6 tablet 1   No current facility-administered medications on file prior to visit.    Review of Systems  Constitutional:  Negative for chills and fever.  Respiratory:  Negative for cough.   Cardiovascular:  Negative for chest pain and palpitations.  Gastrointestinal:  Negative for nausea and vomiting.  Musculoskeletal:  Positive for back pain.  Neurological:  Negative for numbness.      Objective:    BP 130/70   Pulse 75   Temp 98 F (36.7 C) (Oral)   Ht 5' (1.524 m)   Wt 137 lb 9.6 oz (62.4 kg)   SpO2 95%   BMI 26.87 kg/m  BP Readings from Last 3 Encounters:  06/13/23 130/70  04/24/23 114/64  03/10/23 112/64   Wt Readings from Last 3 Encounters:  06/13/23 137 lb 9.6 oz (62.4 kg)  04/24/23 144 lb 6 oz (65.5 kg)  03/10/23 142 lb 6.4 oz (64.6 kg)    Physical Exam Vitals reviewed.  Constitutional:      Appearance: She is well-developed.  Eyes:     Conjunctiva/sclera: Conjunctivae normal.  Cardiovascular:     Rate and Rhythm: Normal rate and regular rhythm.     Pulses: Normal pulses.     Heart sounds: Normal heart sounds.  Pulmonary:     Effort: Pulmonary effort is normal.     Breath sounds: Normal breath sounds. No wheezing, rhonchi or rales.  Skin:    General: Skin is warm and dry.  Neurological:     Mental Status: She is alert.  Psychiatric:        Speech: Speech normal.        Behavior: Behavior normal.        Thought  Content: Thought content normal.

## 2023-06-13 NOTE — Patient Instructions (Signed)
Please resume gabapentin as previously prescribed.  You may take gabapentin 200 mg in the morning, gabapentin 200 mg at midday and gabapentin 300 mg at bedtime.  Please monitor for excessive sedation, drowsiness particularly as you are on tramadol and methocarbamol.

## 2023-06-13 NOTE — Progress Notes (Signed)
She presents today chief complaint of a painful big toe where she wears her compression hose is just killing her she says it pulled on the toe and makes it red and sore.  Objective: Vital signs stable oriented x 3 there is no erythema edema cellulitis drainage or odor she does have tenderness on palpation to the distal edge of the nails with do appear to be slightly over grown and incurvated.  Assessment: Ingrown toenails nail dystrophy hallux bilaterally and lesser toes.  Plan: Debrided all of her nails for her today.

## 2023-06-13 NOTE — Assessment & Plan Note (Addendum)
Reviewed orthopedic consult.  Unable to see MRI lumbar spine.  She has follow-up  with Dr. Lorene Dy next month.  We discussed  resuming previous dose of gabapentin to see if produced moderate improvement of pain. Increase gabapentin 200 mg every morning, gabapentin 200 mg midday and gabapentin 300 mg every afternoon. Consider decrease after potential ESI due to concern of sedation, polypharmacy. Encouraged continued heat.  Counseled on sedating properties of tramadol, methocarbamol and gabapentin.

## 2023-06-22 DIAGNOSIS — M545 Low back pain, unspecified: Secondary | ICD-10-CM | POA: Diagnosis not present

## 2023-06-22 DIAGNOSIS — M5136 Other intervertebral disc degeneration, lumbar region: Secondary | ICD-10-CM | POA: Diagnosis not present

## 2023-06-22 DIAGNOSIS — M47896 Other spondylosis, lumbar region: Secondary | ICD-10-CM | POA: Diagnosis not present

## 2023-06-22 DIAGNOSIS — M48061 Spinal stenosis, lumbar region without neurogenic claudication: Secondary | ICD-10-CM | POA: Diagnosis not present

## 2023-06-22 DIAGNOSIS — M791 Myalgia, unspecified site: Secondary | ICD-10-CM | POA: Diagnosis not present

## 2023-06-22 DIAGNOSIS — M5416 Radiculopathy, lumbar region: Secondary | ICD-10-CM | POA: Diagnosis not present

## 2023-07-11 ENCOUNTER — Encounter: Payer: Self-pay | Admitting: Family

## 2023-07-12 NOTE — Telephone Encounter (Signed)
Claudia Andersen message sent to pt asking about symptoms

## 2023-07-13 ENCOUNTER — Telehealth (INDEPENDENT_AMBULATORY_CARE_PROVIDER_SITE_OTHER): Payer: Medicare PPO | Admitting: Family

## 2023-07-13 VITALS — Ht 60.0 in | Wt 134.6 lb

## 2023-07-13 DIAGNOSIS — U071 COVID-19: Secondary | ICD-10-CM

## 2023-07-13 MED ORDER — PREDNISONE 10 MG PO TABS
ORAL_TABLET | ORAL | 0 refills | Status: DC
Start: 2023-07-13 — End: 2023-10-25

## 2023-07-13 NOTE — Patient Instructions (Addendum)
Limit cold preparations with sudafed.   You may continue mucinex Extended release: 600 mg to 1.2 g every 12 hours as needed  I have sent in prednisone taper for you to start tomorrow before lunchtime.  Please take all doses prior to lunch each day as it may interfere with sleep

## 2023-07-13 NOTE — Assessment & Plan Note (Signed)
Afebrile.  No respiratory distress.  Patient is beyond window to use antiviral.  Mostly bothered by nasal congestion and sinus pressure.  We discussed short course of prednisone with continuation of Mucinex.  Advised her to avoid decongestants as may raise blood pressure.  Patient will let me know how she is doing.

## 2023-07-13 NOTE — Progress Notes (Signed)
Virtual Visit via Video Note  I connected with Claudia Andersen on 07/13/23 at 11:30 AM EDT by a video enabled telemedicine application and verified that I am speaking with the correct person using two identifiers. Location patient: home Location provider: work  Persons participating in the virtual visit: patient, provider  I discussed the limitations of evaluation and management by telemedicine and the availability of in person appointments. The patient expressed understanding and agreed to proceed.  HPI:  Symptoms started 5 days ago  She is mostly bothered by thick nasal congestion, PND.   Sinuses feel full.   She has been using mucinex, dayquil and nyquil  Appetite is decreased and she has 'slight' sore throat.   No fever, SOB, cough.   Covid booster in [facility] 07/04/2023  She has been on prednisone in the past and tolerated well  ROS: See pertinent positives and negatives per HPI.  EXAM:  VITALS per patient if applicable: Ht 5' (1.524 m)   Wt 134 lb 9.6 oz (61.1 kg)   BMI 26.29 kg/m  BP Readings from Last 3 Encounters:  06/13/23 130/70  04/24/23 114/64  03/10/23 112/64   Wt Readings from Last 3 Encounters:  07/13/23 134 lb 9.6 oz (61.1 kg)  06/13/23 137 lb 9.6 oz (62.4 kg)  04/24/23 144 lb 6 oz (65.5 kg)    GENERAL: alert, oriented, appears well and in no acute distress  HEENT: atraumatic, conjunttiva clear, no obvious abnormalities on inspection of external nose and ears  NECK: normal movements of the head and neck  LUNGS: on inspection no signs of respiratory distress, breathing rate appears normal, no obvious gross SOB, gasping or wheezing  CV: no obvious cyanosis  MS: moves all visible extremities without noticeable abnormality  PSYCH/NEURO: pleasant and cooperative, no obvious depression or anxiety, speech and thought processing grossly intact  ASSESSMENT AND PLAN: COVID-19 Assessment & Plan: Afebrile.  No respiratory distress.  Patient is  beyond window to use antiviral.  Mostly bothered by nasal congestion and sinus pressure.  We discussed short course of prednisone with continuation of Mucinex.  Advised her to avoid decongestants as may raise blood pressure.  Patient will let me know how she is doing.   Orders: -     predniSONE; Take 40 mg by mouth on day 1, then taper 10 mg daily until gone  Dispense: 10 tablet; Refill: 0     -we discussed possible serious and likely etiologies, options for evaluation and workup, limitations of telemedicine visit vs in person visit, treatment, treatment risks and precautions. Pt prefers to treat via telemedicine empirically rather then risking or undertaking an in person visit at this moment.    I discussed the assessment and treatment plan with the patient. The patient was provided an opportunity to ask questions and all were answered. The patient agreed with the plan and demonstrated an understanding of the instructions.   The patient was advised to call back or seek an in-person evaluation if the symptoms worsen or if the condition fails to improve as anticipated.  Advised if desired AVS can be mailed or viewed via MyChart if Mychart user.   Rennie Plowman, FNP

## 2023-07-26 DIAGNOSIS — M5416 Radiculopathy, lumbar region: Secondary | ICD-10-CM | POA: Diagnosis not present

## 2023-07-28 DIAGNOSIS — M810 Age-related osteoporosis without current pathological fracture: Secondary | ICD-10-CM | POA: Diagnosis not present

## 2023-08-23 ENCOUNTER — Other Ambulatory Visit: Payer: Self-pay | Admitting: Family

## 2023-08-24 DIAGNOSIS — M791 Myalgia, unspecified site: Secondary | ICD-10-CM | POA: Diagnosis not present

## 2023-08-24 DIAGNOSIS — M51369 Other intervertebral disc degeneration, lumbar region without mention of lumbar back pain or lower extremity pain: Secondary | ICD-10-CM | POA: Diagnosis not present

## 2023-08-24 DIAGNOSIS — M48061 Spinal stenosis, lumbar region without neurogenic claudication: Secondary | ICD-10-CM | POA: Diagnosis not present

## 2023-08-24 DIAGNOSIS — M5416 Radiculopathy, lumbar region: Secondary | ICD-10-CM | POA: Diagnosis not present

## 2023-08-24 DIAGNOSIS — M545 Low back pain, unspecified: Secondary | ICD-10-CM | POA: Diagnosis not present

## 2023-08-24 DIAGNOSIS — M7061 Trochanteric bursitis, right hip: Secondary | ICD-10-CM | POA: Diagnosis not present

## 2023-08-24 DIAGNOSIS — M47896 Other spondylosis, lumbar region: Secondary | ICD-10-CM | POA: Diagnosis not present

## 2023-08-25 ENCOUNTER — Other Ambulatory Visit: Payer: Self-pay | Admitting: Family

## 2023-08-25 DIAGNOSIS — F325 Major depressive disorder, single episode, in full remission: Secondary | ICD-10-CM

## 2023-08-28 DIAGNOSIS — M810 Age-related osteoporosis without current pathological fracture: Secondary | ICD-10-CM | POA: Diagnosis not present

## 2023-08-31 ENCOUNTER — Encounter: Payer: Self-pay | Admitting: Podiatry

## 2023-08-31 ENCOUNTER — Ambulatory Visit: Payer: Medicare PPO | Admitting: Podiatry

## 2023-08-31 VITALS — Ht 60.0 in | Wt 134.6 lb

## 2023-08-31 DIAGNOSIS — M79676 Pain in unspecified toe(s): Secondary | ICD-10-CM

## 2023-08-31 DIAGNOSIS — B351 Tinea unguium: Secondary | ICD-10-CM

## 2023-08-31 NOTE — Progress Notes (Signed)
  Subjective:  Patient ID: Claudia Andersen, female    DOB: 07-11-1941,  MRN: [id number]  82 y.o. female presents painful thick toenails that are difficult to trim. Pain interferes with ambulation. Aggravating factors include wearing enclosed shoe gear. Pain is relieved with periodic professional debridement. Most symptomatic is her left great toe which has grown out. She denies any redness, drainage or swelling. Chief Complaint  Patient presents with   Nail Problem    Pt is here for RFC, not a diabetic, PCP is Dr Jason Coop, LOV was in September.   New problem(s): None   PCP is Arnett, Lyn Records, FNP.  Allergies  Allergen Reactions   Nsaids Other (See Comments)    Reflux symptoms    Review of Systems: Negative except as noted in the HPI.   Objective:  Claudia Andersen is a pleasant 82 y.o. female WD, WN in NAD. AAO x 3.  Vascular Examination: Vascular status intact b/l with palpable pedal pulses. CFT immediate b/l. Pedal hair present. No edema. No pain with calf compression b/l. Skin temperature gradient WNL b/l. No varicosities noted. No cyanosis or clubbing noted.  Neurological Examination: Sensation grossly intact b/l with 10 gram monofilament. Vibratory sensation intact b/l.  Dermatological Examination: Pedal skin with normal turgor, texture and tone b/l. No open wounds nor interdigital macerations noted. Toenails 1-5 left, 2, 3, 4 right thick, discolored, elongated with subungual debris and pain on dorsal palpation. No hyperkeratotic lesions noted b/l.   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 No corns, calluses nor porokeratotic lesions noted.  Musculoskeletal Examination: Muscle strength 5/5 to b/l LE.  No pain, crepitus noted b/l. 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 Patient ambulates independently without assistive aids.   Radiographs: None  Last A1c:       No data to display         Assessment:   1. Pain due to onychomycosis of toenail    Plan:   -Patient was evaluated today. All questions/concerns addressed on today's visit. -Patient to continue soft, supportive shoe gear daily. -Toenails 1-5 left foot, R 2nd toe, R 3rd toe, and R 4th toe debrided in length and girth without iatrogenic bleeding with sterile nail nipper and dremel.  -Patient/POA to call should there be question/concern in the interim.  Return in about 3 months (around 12/01/2023).  Freddie Breech, DPM

## 2023-09-01 DIAGNOSIS — M7061 Trochanteric bursitis, right hip: Secondary | ICD-10-CM | POA: Diagnosis not present

## 2023-09-04 DIAGNOSIS — Z0100 Encounter for examination of eyes and vision without abnormal findings: Secondary | ICD-10-CM | POA: Diagnosis not present

## 2023-09-04 DIAGNOSIS — H35379 Puckering of macula, unspecified eye: Secondary | ICD-10-CM | POA: Diagnosis not present

## 2023-09-21 DIAGNOSIS — M549 Dorsalgia, unspecified: Secondary | ICD-10-CM | POA: Diagnosis not present

## 2023-09-25 DIAGNOSIS — M549 Dorsalgia, unspecified: Secondary | ICD-10-CM | POA: Diagnosis not present

## 2023-09-27 ENCOUNTER — Other Ambulatory Visit: Payer: Self-pay | Admitting: Family

## 2023-09-27 DIAGNOSIS — M810 Age-related osteoporosis without current pathological fracture: Secondary | ICD-10-CM | POA: Diagnosis not present

## 2023-09-27 DIAGNOSIS — E785 Hyperlipidemia, unspecified: Secondary | ICD-10-CM

## 2023-09-28 DIAGNOSIS — M51369 Other intervertebral disc degeneration, lumbar region without mention of lumbar back pain or lower extremity pain: Secondary | ICD-10-CM | POA: Diagnosis not present

## 2023-09-28 DIAGNOSIS — M48061 Spinal stenosis, lumbar region without neurogenic claudication: Secondary | ICD-10-CM | POA: Diagnosis not present

## 2023-09-28 DIAGNOSIS — M791 Myalgia, unspecified site: Secondary | ICD-10-CM | POA: Diagnosis not present

## 2023-09-28 DIAGNOSIS — M7061 Trochanteric bursitis, right hip: Secondary | ICD-10-CM | POA: Diagnosis not present

## 2023-09-28 DIAGNOSIS — M47896 Other spondylosis, lumbar region: Secondary | ICD-10-CM | POA: Diagnosis not present

## 2023-09-29 DIAGNOSIS — M549 Dorsalgia, unspecified: Secondary | ICD-10-CM | POA: Diagnosis not present

## 2023-10-02 DIAGNOSIS — M549 Dorsalgia, unspecified: Secondary | ICD-10-CM | POA: Diagnosis not present

## 2023-10-03 ENCOUNTER — Other Ambulatory Visit: Payer: Self-pay | Admitting: Family

## 2023-10-06 DIAGNOSIS — M549 Dorsalgia, unspecified: Secondary | ICD-10-CM | POA: Diagnosis not present

## 2023-10-09 ENCOUNTER — Other Ambulatory Visit: Payer: Medicare PPO

## 2023-10-09 ENCOUNTER — Encounter: Payer: Self-pay | Admitting: *Deleted

## 2023-10-09 ENCOUNTER — Telehealth: Payer: Self-pay | Admitting: *Deleted

## 2023-10-09 NOTE — Telephone Encounter (Signed)
Left voicemail for patient to call office back and schedule an appt with Worthy Rancher today before 2pm. Pt was added to the lab scheduled by E2C2 for a possible UTI without seeing a provider. Pt must be seen by a provider for evaluation & treatment. (Please cancel lab appt once this has been corrected.) Thanks

## 2023-10-10 ENCOUNTER — Encounter: Payer: Self-pay | Admitting: Family Medicine

## 2023-10-10 ENCOUNTER — Ambulatory Visit: Payer: Medicare PPO | Admitting: Family Medicine

## 2023-10-10 VITALS — BP 128/80 | HR 72 | Temp 98.2°F | Resp 18 | Ht 60.0 in | Wt 145.2 lb

## 2023-10-10 DIAGNOSIS — R300 Dysuria: Secondary | ICD-10-CM

## 2023-10-10 DIAGNOSIS — R8290 Unspecified abnormal findings in urine: Secondary | ICD-10-CM | POA: Insufficient documentation

## 2023-10-10 DIAGNOSIS — N3000 Acute cystitis without hematuria: Secondary | ICD-10-CM | POA: Insufficient documentation

## 2023-10-10 DIAGNOSIS — M2550 Pain in unspecified joint: Secondary | ICD-10-CM | POA: Diagnosis not present

## 2023-10-10 LAB — POC URINALSYSI DIPSTICK (AUTOMATED)
Bilirubin, UA: NEGATIVE
Glucose, UA: NEGATIVE
Ketones, UA: NEGATIVE
Nitrite, UA: POSITIVE
Protein, UA: POSITIVE — AB
Spec Grav, UA: 1.025 (ref 1.010–1.025)
Urobilinogen, UA: 0.2 U/dL
pH, UA: 6 (ref 5.0–8.0)

## 2023-10-10 MED ORDER — GABAPENTIN 300 MG PO CAPS
300.0000 mg | ORAL_CAPSULE | Freq: Every evening | ORAL | 0 refills | Status: DC
Start: 2023-10-10 — End: 2024-07-18

## 2023-10-10 MED ORDER — CEPHALEXIN 250 MG PO CAPS: 250.0000 mg | ORAL_CAPSULE | Freq: Four times a day (QID) | ORAL | 0 refills | Status: AC

## 2023-10-10 NOTE — Assessment & Plan Note (Signed)
Symptoms of dysuria, frequency, and incomplete bladder emptying for one week. Urinalysis positive for nitrates and leukocytes. No fever, back pain, or hematuria. -Start Keflex for UTI. -Send urine culture and adjust antibiotics if necessary. -Stay hydrated and take probiotics during and for 14 days after antibiotic course.

## 2023-10-10 NOTE — Assessment & Plan Note (Signed)
Gabapentin 300mg . -Refill prescription for Gabapentin 300mg .

## 2023-10-10 NOTE — Progress Notes (Signed)
SUBJECTIVE:   Chief Complaint  Patient presents with   Urinary Frequency    X 1 week Burning when she goes to the bathroom   HPI Presents to clinic for acute visit  Discussed the use of AI scribe software for clinical note transcription with the patient, who gave verbal consent to proceed.  History of Present Illness The patient presented with symptoms suggestive of a urinary tract infection (UTI) that had been ongoing for approximately one week. The primary complaints were urinary frequency and a burning sensation during urination. The patient also reported a delay in the initiation of urination once the urge was felt. Following urination, the patient experienced a persistent urge to urinate but was unable to do so. The patient denied any fever, changes in chronic back pain, groin pain, or hematuria.  The patient's urine test showed positive nitrates and leukocytes, indicative of a UTI. The patient reported a history of a single UTI many years ago but has not had frequent occurrences. The patient has an intolerance to NSAIDs but no known allergies.  The patient also requested a refill of gabapentin 300mg , which is managed by another provider. The patient did not report any abdominal pain or pressure. The patient's past medical history, current medications, and lifestyle factors were not discussed during the consultation.    PERTINENT PMH / PSH: As above  OBJECTIVE:  BP 128/80   Pulse 72   Temp 98.2 F (36.8 C)   Resp 18   Ht 5' (1.524 m)   Wt 145 lb 4 oz (65.9 kg)   SpO2 100%   BMI 28.37 kg/m    Physical Exam Vitals reviewed.  Abdominal:     Palpations: Abdomen is soft.     Tenderness: There is no abdominal tenderness. There is no right CVA tenderness or left CVA tenderness.        10/10/2023    8:56 AM 06/13/2023   10:38 AM 03/10/2023   10:52 AM 01/23/2023    9:45 AM 12/16/2022    8:42 AM  Depression screen PHQ 2/9  Decreased Interest 0 0 1 0 0  Down, Depressed,  Hopeless 0 0 0 0 0  PHQ - 2 Score 0 0 1 0 0  Altered sleeping 0 0 0 0 0  Tired, decreased energy 0 0 0 0 0  Change in appetite 0 0 1 0 0  Feeling bad or failure about yourself  0 0 1 0 0  Trouble concentrating 0 0 0 0 0  Moving slowly or fidgety/restless 0 0 0 0 0  Suicidal thoughts 0 0 0 0 0  PHQ-9 Score 0 0 3 0 0  Difficult doing work/chores Not difficult at all Not difficult at all Not difficult at all Not difficult at all Not difficult at all      10/10/2023    8:56 AM 03/10/2023   10:52 AM 01/23/2023    9:45 AM 12/16/2022    8:43 AM  GAD 7 : Generalized Anxiety Score  Nervous, Anxious, on Edge 0 0 0 0  Control/stop worrying 0 0 0 0  Worry too much - different things 0 0 0 0  Trouble relaxing 0 0 0 0  Restless 0 0 0 0  Easily annoyed or irritable 0 1 0 0  Afraid - awful might happen 0 0 0 0  Total GAD 7 Score 0 1 0 0  Anxiety Difficulty Not difficult at all Not difficult at all Not difficult at all Not difficult  at all    ASSESSMENT/PLAN:  Acute cystitis without hematuria Assessment & Plan: Symptoms of dysuria, frequency, and incomplete bladder emptying for one week. Urinalysis positive for nitrates and leukocytes. No fever, back pain, or hematuria. -Start Keflex for UTI. -Send urine culture and adjust antibiotics if necessary. -Stay hydrated and take probiotics during and for 14 days after antibiotic course.  Orders: -     POCT Urinalysis Dipstick (Automated) -     Urine Culture -     Cephalexin; Take 1 capsule (250 mg total) by mouth 4 (four) times daily for 5 days.  Dispense: 20 capsule; Refill: 0  Burning with urination -     POCT Urinalysis Dipstick (Automated)  Abnormal urinalysis -     Urine Culture  Arthralgia, unspecified joint Assessment & Plan: Gabapentin 300mg . -Refill prescription for Gabapentin 300mg .   Orders: -     Gabapentin; Take 1 capsule (300 mg total) by mouth every evening.  Dispense: 90 capsule; Refill: 0    PDMP reviewed  Return if  symptoms worsen or fail to improve, for PCP.  Dana Allan, MD

## 2023-10-10 NOTE — Patient Instructions (Addendum)
It was a pleasure meeting you today. Thank you for allowing me to take part in your health care.  Our goals for today as we discussed include:  Urine positive for infection Will send for further evaluation.  Antibiotics may change pending results. Start Keflex 250 mg 4 times a day.  Breakfast, lunch, dinner and at bedtime. Take probiotics daily while on antibiotics and continue for 14 days after treatment. Increase water intake Can drink cranberry juice Avoid sweet drinks  Refill sent for requested medication  If no improvement follow up with PCP   This is a list of the screening recommended for you and due dates:  Health Maintenance  Topic Date Due   Flu Shot  01/15/2024*   Medicare Annual Wellness Visit  12/15/2023   DTaP/Tdap/Td vaccine (3 - Td or Tdap) 04/14/2033   Pneumonia Vaccine  Completed   DEXA scan (bone density measurement)  Completed   COVID-19 Vaccine  Completed   Zoster (Shingles) Vaccine  Completed   HPV Vaccine  Aged Out  *Topic was postponed. The date shown is not the original due date.    If you have any questions or concerns, please do not hesitate to call the office at [phone].  I look forward to our next visit and until then take care and stay safe.  Regards,   Dana Allan, MD   [facility]

## 2023-10-12 LAB — URINE CULTURE
MICRO NUMBER:: 15888345
SPECIMEN QUALITY:: ADEQUATE

## 2023-10-13 ENCOUNTER — Telehealth: Payer: Self-pay

## 2023-10-13 DIAGNOSIS — M549 Dorsalgia, unspecified: Secondary | ICD-10-CM | POA: Diagnosis not present

## 2023-10-13 NOTE — Telephone Encounter (Signed)
Left message to return call to our office.  Urine positive for infection with E.Coli.  Continue Keflex as prescribed

## 2023-10-16 ENCOUNTER — Telehealth: Payer: Self-pay

## 2023-10-16 DIAGNOSIS — M549 Dorsalgia, unspecified: Secondary | ICD-10-CM | POA: Diagnosis not present

## 2023-10-16 NOTE — Telephone Encounter (Signed)
Left message to return call to our office. Called daughter number as well and she said that she would get her mom to call us.  Urine positive for infection with E.Coli.  Continue Keflex as prescribed

## 2023-10-19 ENCOUNTER — Other Ambulatory Visit: Payer: Self-pay | Admitting: Family

## 2023-10-20 DIAGNOSIS — M549 Dorsalgia, unspecified: Secondary | ICD-10-CM | POA: Diagnosis not present

## 2023-10-23 DIAGNOSIS — M549 Dorsalgia, unspecified: Secondary | ICD-10-CM | POA: Diagnosis not present

## 2023-10-25 ENCOUNTER — Ambulatory Visit: Payer: Medicare PPO | Admitting: Family

## 2023-10-25 ENCOUNTER — Encounter: Payer: Self-pay | Admitting: Family

## 2023-10-25 VITALS — BP 126/78 | HR 76 | Temp 97.7°F | Ht 60.0 in | Wt 141.4 lb

## 2023-10-25 DIAGNOSIS — M545 Low back pain, unspecified: Secondary | ICD-10-CM

## 2023-10-25 DIAGNOSIS — G8929 Other chronic pain: Secondary | ICD-10-CM | POA: Diagnosis not present

## 2023-10-25 DIAGNOSIS — M5416 Radiculopathy, lumbar region: Secondary | ICD-10-CM | POA: Diagnosis not present

## 2023-10-25 NOTE — Assessment & Plan Note (Signed)
 Poor control.  Pending follow-up orthopedics today.  Will follow. Continue gabapentin  200mg  qam , 200mg  midday and 300 mg qpm,  baclofen  10mg  tid as needed.   She is taking taking tramadol  100mg  qpm advised to split tramadol  and to 50 mg in the morning and 50 mg in the evening for better pain control, less sedation.  Discussed use of acetaminophen  and not to exceed 3 g/day  due to her small frame.  She takes tylenol  1300 mg in the morning and 1300mg  at noon which I advised to continue

## 2023-10-25 NOTE — Patient Instructions (Addendum)
 Lets change tramadol  to 50mg  in the morning and 50mg  at bedtime.   Monitor for sedation   Continue scheduling Tylenol  Arthritis which is a 650mg  tablet .   Maximum daily dose of acetaminophen  4 g per day from all sources.  If you are taking another medication which includes acetaminophen  (Tylenol ) which may be in cough and cold preparations or pain medication such as Percocet, you will need to factor that into your total daily dose to be safe.  Please let me know if any questions  A great article regarding how to safely take and dose tylenol  found below.  Title : 'Acetaminophen  safety: Be cautious but not afraid'  https://www.health.[employer].edu/pain/acetaminophen -safety-be-cautious-but-not-afraid

## 2023-10-25 NOTE — Progress Notes (Signed)
 Assessment & Plan:  Chronic bilateral low back pain without sciatica Assessment & Plan: Poor control.  Pending follow-up orthopedics today.  Will follow. Continue gabapentin  200mg  qam , 200mg  midday and 300 mg qpm,  baclofen  10mg  tid as needed.   She is taking taking tramadol  100mg  qpm advised to split tramadol  and to 50 mg in the morning and 50 mg in the evening for better pain control, less sedation.  Discussed use of acetaminophen  and not to exceed 3 g/day  due to her small frame.  She takes tylenol  1300 mg in the morning and 1300mg  at noon which I advised to continue        Return precautions given.   Risks, benefits, and alternatives of the medications and treatment plan prescribed today were discussed, and patient expressed understanding.   Education regarding symptom management and diagnosis given to patient on AVS either electronically or printed.  Return in about 3 months (around 01/23/2024).  Rollene Northern, FNP  Subjective:    Patient ID: Nena JULIANNA Lazier, female    DOB: Aug 20, 1941, 83 y.o.   MRN: [id number]  CC: JUSTA HATCHELL is a 83 y.o. female who presents today for follow up.   HPI: No new complaints.  She is scheduled to have ESI today with Dr. Etta.  She continues to have significant low back pain.   Compliant gabapentin  200mg  qam , 200mg  midday and 300 mg qpm She takes baclofen  as needed.  She is no longer taking Robaxin  She is taking taking tramadol  100mg  qpm. She takes baclofen  10mg  daily to bid PRN which is not sedating for her.  Takes tylenol  1300 mg in the morning and 1300mg  at noon.   Currently doing hydro [facility] at [facility] which has been helpful for pain control.    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  Allergies: Nsaids Current Outpatient Medications on File Prior to Visit  Medication Sig Dispense Refill   acetaminophen  (TYLENOL ) 650 MG CR tablet Take 1,300 mg by mouth 2 (two) times daily.     atorvastatin  (LIPITOR) 10 MG tablet TAKE ONE TABLET BY MOUTH EVERY DAY 90 tablet 3   baclofen  (LIORESAL ) 10 MG tablet TAKE 1 TABLET BY MOUTH 3 TIMES DAILY AS NEEDED FOR MUSCLE SPASMS. 90 each 0   Biotin 2500 MCG CAPS Take 2,500 mcg by mouth daily.     Calcium  Carb-Cholecalciferol (CALCIUM  600 + D PO) Take 1 tablet by mouth daily.     Clotrimazole  1 % OINT Apply 1 application topically 2 (two) times daily. 56.7 g 1   diclofenac  Sodium (VOLTAREN ) 1 % GEL Apply 4 g topically 4 (four) times daily. 100 g 1   Fluticasone  Furoate (FLONASE  SENSIMIST NA) Place 1 spray into both nostrils daily as needed (allergies).     fluvoxaMINE  (LUVOX ) 100 MG tablet TAKE 1 TABLET BY MOUTH ONCE DAILY 90 tablet 3   fluvoxaMINE  (LUVOX ) 50 MG tablet TAKE 1 TABLET BY MOUTH DAILY 90 tablet 3   gabapentin  (NEURONTIN ) 100 MG capsule Take 200mg  PO qam, 200mg  PO midday 120 capsule 5   gabapentin  (NEURONTIN ) 300 MG capsule Take 1 capsule (300 mg total) by mouth every evening. 90 capsule 0   Homeopathic Products (ARNICA EX) Apply 1 Application topically  as needed (apply to broken skin as needed.).     hydrochlorothiazide  (MICROZIDE ) 12.5 MG capsule Take 1 capsule (12.5 mg total) by mouth daily as needed. 90 capsule 3   Lactobacillus (PROBIOTIC ACIDOPHILUS PO) Take 1 tablet by mouth daily.     lidocaine  (LIDODERM ) 5 % Place 1 patch onto the skin as needed (1 every 24 hours). Remove & Discard patch within 12 hours or as directed by MD     Multiple Vitamin (MULTIVITAMIN) tablet Take 1 tablet by mouth daily.     Multiple Vitamins-Minerals (ICAPS AREDS FORMULA PO) Take 1 tablet by mouth 2 (two) times daily.     mupirocin  ointment (BACTROBAN ) 2 % Apply 1 application  topically 2 (two) times daily. 22 g 1   pantoprazole  (PROTONIX ) 40 MG tablet TAKE ONE TABLET (40 MG) BY MOUTH EVERY DAY 1 HOUR BEFORE BREAKFAST 90 tablet 3   traMADol  (ULTRAM ) 50 MG tablet Take 1 tablet (50 mg total) by mouth every 12 (twelve) hours as needed. 30 tablet 2   traZODone  (DESYREL ) 50 MG tablet TAKE TWO TABLETS BY MOUTH AT BEDTIME 180 tablet 3   triamcinolone  (KENALOG ) 0.025 % cream Apply 1 application topically 2 (two) times daily. 15 g 1   valACYclovir  (VALTREX ) 1000 MG tablet TAKE TWO TABLETS BY MOUTH EVERY 12 HOURSFOR 1 DAY. **START AS SOON AS POSSIBLE AFTER SYMPTOM ONSET** 6 tablet 1   No current facility-administered medications on file prior to visit.    Review of Systems  Constitutional:  Negative for chills and fever.  Respiratory:  Negative for cough.   Cardiovascular:  Negative for chest pain and palpitations.  Gastrointestinal:  Negative for nausea and vomiting.  Musculoskeletal:  Positive for arthralgias and back pain.      Objective:    BP 126/78   Pulse 76   Temp 97.7 F (36.5 C) (Oral)   Ht 5' (1.524 m)   Wt 141 lb 6.4 oz (64.1 kg)   SpO2 95%   BMI 27.62 kg/m  BP Readings from Last 3 Encounters:  10/25/23 126/78  10/10/23 128/80  06/13/23 130/70   Wt Readings from Last 3 Encounters:  10/25/23 141 lb 6.4 oz (64.1 kg)  10/10/23 145 lb 4 oz (65.9 kg)  08/31/23 134 lb 9.6 oz (61.1 kg)    Physical Exam Vitals reviewed.  Constitutional:      Appearance: She is well-developed.  Eyes:     Conjunctiva/sclera: Conjunctivae normal.  Cardiovascular:     Rate and Rhythm: Normal rate and regular rhythm.     Pulses: Normal pulses.     Heart sounds: Normal heart sounds.  Pulmonary:     Effort: Pulmonary effort is normal.     Breath sounds: Normal breath sounds. No wheezing, rhonchi or rales.  Skin:    General: Skin is warm and dry.  Neurological:     Mental Status: She is alert.  Psychiatric:        Speech: Speech normal.        Behavior:  Behavior normal.        Thought Content: Thought content normal.

## 2023-10-27 DIAGNOSIS — M654 Radial styloid tenosynovitis [de Quervain]: Secondary | ICD-10-CM | POA: Diagnosis not present

## 2023-10-30 DIAGNOSIS — M549 Dorsalgia, unspecified: Secondary | ICD-10-CM | POA: Diagnosis not present

## 2023-11-01 DIAGNOSIS — M810 Age-related osteoporosis without current pathological fracture: Secondary | ICD-10-CM | POA: Diagnosis not present

## 2023-11-03 DIAGNOSIS — M549 Dorsalgia, unspecified: Secondary | ICD-10-CM | POA: Diagnosis not present

## 2023-11-17 DIAGNOSIS — M549 Dorsalgia, unspecified: Secondary | ICD-10-CM | POA: Diagnosis not present

## 2023-11-23 ENCOUNTER — Encounter: Payer: Self-pay | Admitting: Family

## 2023-11-23 DIAGNOSIS — M7061 Trochanteric bursitis, right hip: Secondary | ICD-10-CM | POA: Diagnosis not present

## 2023-11-23 DIAGNOSIS — M51369 Other intervertebral disc degeneration, lumbar region without mention of lumbar back pain or lower extremity pain: Secondary | ICD-10-CM | POA: Diagnosis not present

## 2023-11-23 DIAGNOSIS — M47896 Other spondylosis, lumbar region: Secondary | ICD-10-CM | POA: Diagnosis not present

## 2023-11-23 DIAGNOSIS — M48061 Spinal stenosis, lumbar region without neurogenic claudication: Secondary | ICD-10-CM | POA: Diagnosis not present

## 2023-11-23 DIAGNOSIS — M654 Radial styloid tenosynovitis [de Quervain]: Secondary | ICD-10-CM | POA: Diagnosis not present

## 2023-11-23 DIAGNOSIS — M791 Myalgia, unspecified site: Secondary | ICD-10-CM | POA: Diagnosis not present

## 2023-11-24 ENCOUNTER — Other Ambulatory Visit: Payer: Self-pay | Admitting: Family

## 2023-11-28 DIAGNOSIS — M549 Dorsalgia, unspecified: Secondary | ICD-10-CM | POA: Diagnosis not present

## 2023-12-01 ENCOUNTER — Telehealth: Payer: Self-pay | Admitting: Family

## 2023-12-01 NOTE — Telephone Encounter (Signed)
Copied from CRM [id number]. Topic: Medicare AWV >> Dec 01, 2023  3:06 PM Payton Doughty wrote: Reason for CRM: Called LVM 12/01/2023 to schedule AWV. Please schedule office or virtual visits.  Verlee Rossetti; Care Guide Ambulatory Clinical Support [facility] Direct Dial: [phone]

## 2023-12-04 ENCOUNTER — Ambulatory Visit: Payer: Medicare PPO | Admitting: Podiatry

## 2023-12-04 DIAGNOSIS — M549 Dorsalgia, unspecified: Secondary | ICD-10-CM | POA: Diagnosis not present

## 2023-12-05 DIAGNOSIS — M810 Age-related osteoporosis without current pathological fracture: Secondary | ICD-10-CM | POA: Diagnosis not present

## 2023-12-08 ENCOUNTER — Ambulatory Visit: Payer: Medicare PPO

## 2023-12-08 ENCOUNTER — Encounter: Payer: Self-pay | Admitting: Family

## 2023-12-08 ENCOUNTER — Ambulatory Visit: Payer: Medicare PPO | Admitting: Family

## 2023-12-08 ENCOUNTER — Ambulatory Visit: Payer: Medicare PPO | Admitting: Podiatry

## 2023-12-08 VITALS — BP 118/70 | HR 88 | Temp 97.9°F | Ht 60.0 in | Wt 139.6 lb

## 2023-12-08 DIAGNOSIS — J40 Bronchitis, not specified as acute or chronic: Secondary | ICD-10-CM

## 2023-12-08 DIAGNOSIS — R918 Other nonspecific abnormal finding of lung field: Secondary | ICD-10-CM | POA: Diagnosis not present

## 2023-12-08 MED ORDER — ALBUTEROL SULFATE HFA 108 (90 BASE) MCG/ACT IN AERS
2.0000 | INHALATION_SPRAY | Freq: Four times a day (QID) | RESPIRATORY_TRACT | 0 refills | Status: DC | PRN
Start: 2023-12-08 — End: 2024-03-18

## 2023-12-08 MED ORDER — AMOXICILLIN-POT CLAVULANATE 875-125 MG PO TABS
1.0000 | ORAL_TABLET | Freq: Two times a day (BID) | ORAL | 0 refills | Status: AC
Start: 2023-12-08 — End: 2023-12-15

## 2023-12-08 MED ORDER — BENZONATATE 100 MG PO CAPS
100.0000 mg | ORAL_CAPSULE | Freq: Three times a day (TID) | ORAL | 1 refills | Status: DC | PRN
Start: 2023-12-08 — End: 2024-03-18

## 2023-12-08 NOTE — Patient Instructions (Signed)
Suspect bacterial upper respiratory infection.  Start Augmentin (antibiotic).  Ensure that you are on probiotics.  Use albuterol every 6 hours for first 24 hours to get good medication into the lungs and loosen congestion; after, you may use as needed and eventually stop all together when cough resolves.   I also sent in [facility] for you to take for cough to see if work better for you.    For back pain, you may continue gabapentin 300 mg 3 times a day

## 2023-12-08 NOTE — Progress Notes (Signed)
Assessment & Plan:  Bronchitis Assessment & Plan: Afebrile.  No acute respiratory distress.  Concern for bacterial URI based on duration, adventitious lung sounds.  Pending chest x-ray.  Start Augmentin, Tessalon Perles, albuterol.  Advised to schedule albuterol due to wheezing.  She will let me know how she is doing.  Orders: -     Amoxicillin-Pot Clavulanate; Take 1 tablet by mouth 2 (two) times daily for 7 days.  Dispense: 14 tablet; Refill: 0 -     Benzonatate; Take 1 capsule (100 mg total) by mouth 3 (three) times daily as needed for cough.  Dispense: 20 capsule; Refill: 1 -     DG Chest 2 View; Future -     Albuterol Sulfate HFA; Inhale 2 puffs into the lungs every 6 (six) hours as needed for wheezing or shortness of breath.  Dispense: 8 g; Refill: 0     Return precautions given.   Risks, benefits, and alternatives of the medications and treatment plan prescribed today were discussed, and patient expressed understanding.   Education regarding symptom management and diagnosis given to patient on AVS either electronically or printed.  Return in about 3 months (around 03/06/2024).  Claudia Plowman, FNP  Subjective:    Patient ID: Claudia Andersen, female    DOB: [birth year], 83 y.o.   MRN: [id number]  CC: Claudia Andersen is a 83 y.o. female who presents today for an acute visit.    HPI: Complains of cough, congestion x 10 days, unchanged.  Denies fever  Symptoms started with runny nose and sore throat. She was wheezing, since resolved 2-3 days ago.   She started claritin, mucinex, saline nasal spray, and mucinex with some relief.   Endorse left sided sore throat, left ear pain   H/o smoking, quit 50 years ago.       No ckd No recent abx    Allergies: Nsaids Current Outpatient Medications on File Prior to Visit  Medication Sig Dispense Refill   acetaminophen (TYLENOL) 650 MG CR tablet Take 1,300 mg by mouth 2 (two) times daily.     atorvastatin (LIPITOR) 10 MG  tablet TAKE ONE TABLET BY MOUTH EVERY DAY 90 tablet 3   baclofen (LIORESAL) 10 MG tablet TAKE 1 TABLET BY MOUTH 3 TIMES DAILY AS NEEDED FOR MUSCLE SPASM 90 each 2   Biotin 2500 MCG CAPS Take 2,500 mcg by mouth daily.     Calcium Carb-Cholecalciferol (CALCIUM 600 + D PO) Take 1 tablet by mouth daily.     Clotrimazole 1 % OINT Apply 1 application topically 2 (two) times daily. 56.7 g 1   diclofenac Sodium (VOLTAREN) 1 % GEL Apply 4 g topically 4 (four) times daily. 100 g 1   Fluticasone Furoate (FLONASE SENSIMIST NA) Place 1 spray into both nostrils daily as needed (allergies).     fluvoxaMINE (LUVOX) 100 MG tablet TAKE 1 TABLET BY MOUTH ONCE DAILY 90 tablet 3   fluvoxaMINE (LUVOX) 50 MG tablet TAKE 1 TABLET BY MOUTH DAILY 90 tablet 3   gabapentin (NEURONTIN) 100 MG capsule Take 200mg  PO qam, 200mg  PO midday 120 capsule 5   gabapentin (NEURONTIN) 300 MG capsule Take 1 capsule (300 mg total) by mouth every evening. 90 capsule 0   Homeopathic Products (ARNICA EX) Apply 1 Application topically as needed (apply to broken skin as needed.).     hydrochlorothiazide (MICROZIDE) 12.5 MG capsule Take 1 capsule (12.5 mg total) by mouth daily as needed. 90 capsule 3   Lactobacillus (PROBIOTIC  ACIDOPHILUS PO) Take 1 tablet by mouth daily.     lidocaine (LIDODERM) 5 % Place 1 patch onto the skin as needed (1 every 24 hours). Remove & Discard patch within 12 hours or as directed by MD     Multiple Vitamin (MULTIVITAMIN) tablet Take 1 tablet by mouth daily.     Multiple Vitamins-Minerals (ICAPS AREDS FORMULA PO) Take 1 tablet by mouth 2 (two) times daily.     mupirocin ointment (BACTROBAN) 2 % Apply 1 application topically 2 (two) times daily. 22 g 1   pantoprazole (PROTONIX) 40 MG tablet TAKE ONE TABLET (40 MG) BY MOUTH EVERY DAY 1 HOUR BEFORE BREAKFAST 90 tablet 3   traMADol (ULTRAM) 50 MG tablet Take 1 tablet (50 mg total) by mouth every 12 (twelve) hours as needed. 30 tablet 2   traZODone (DESYREL) 50 MG  tablet TAKE TWO TABLETS BY MOUTH AT BEDTIME 180 tablet 3   triamcinolone (KENALOG) 0.025 % cream Apply 1 application topically 2 (two) times daily. 15 g 1   valACYclovir (VALTREX) 1000 MG tablet TAKE TWO TABLETS BY MOUTH EVERY 12 HOURSFOR 1 DAY. **START AS SOON AS POSSIBLE AFTER SYMPTOM ONSET** 6 tablet 1   No current facility-administered medications on file prior to visit.    Review of Systems  Constitutional:  Negative for chills and fever.  HENT:  Positive for congestion, sinus pain and sore throat. Negative for trouble swallowing.   Respiratory:  Positive for cough and wheezing. Negative for shortness of breath.   Cardiovascular:  Negative for chest pain and palpitations.  Gastrointestinal:  Negative for nausea and vomiting.      Objective:    BP 118/70   Pulse 88   Temp 97.9 F (36.6 C) (Oral)   Ht 5' (1.524 m)   Wt 139 lb 9.6 oz (63.3 kg)   SpO2 97%   BMI 27.26 kg/m   BP Readings from Last 3 Encounters:  12/08/23 118/70  10/25/23 126/78  10/10/23 128/80   Wt Readings from Last 3 Encounters:  12/08/23 139 lb 9.6 oz (63.3 kg)  10/25/23 141 lb 6.4 oz (64.1 kg)  10/10/23 145 lb 4 oz (65.9 kg)    Physical Exam Vitals reviewed.  Constitutional:      Appearance: She is well-developed.  HENT:     Head: Normocephalic and atraumatic.     Right Ear: Hearing, tympanic membrane, ear canal and external ear normal. No decreased hearing noted. No drainage, swelling or tenderness. No middle ear effusion. No foreign body. Tympanic membrane is not erythematous or bulging.     Left Ear: Hearing, tympanic membrane, ear canal and external ear normal. No decreased hearing noted. No drainage, swelling or tenderness.  No middle ear effusion. No foreign body. Tympanic membrane is not erythematous or bulging.     Nose: Nose normal. No rhinorrhea.     Right Sinus: No maxillary sinus tenderness or frontal sinus tenderness.     Left Sinus: No maxillary sinus tenderness or frontal sinus  tenderness.     Mouth/Throat:     Pharynx: Uvula midline. No oropharyngeal exudate or posterior oropharyngeal erythema.     Tonsils: No tonsillar abscesses.  Eyes:     Conjunctiva/sclera: Conjunctivae normal.  Cardiovascular:     Rate and Rhythm: Regular rhythm.     Pulses: Normal pulses.     Heart sounds: Normal heart sounds.  Pulmonary:     Effort: Pulmonary effort is normal.     Breath sounds: Examination of the right-upper field  reveals rales. Examination of the left-upper field reveals rales. Rales present. No wheezing or rhonchi.     Comments: Few expiratory wheezes. Rales present.   Lymphadenopathy:     Head:     Right side of head: No submental, submandibular, tonsillar, preauricular, posterior auricular or occipital adenopathy.     Left side of head: No submental, submandibular, tonsillar, preauricular, posterior auricular or occipital adenopathy.     Cervical: No cervical adenopathy.  Skin:    General: Skin is warm and dry.  Neurological:     Mental Status: She is alert.  Psychiatric:        Speech: Speech normal.        Behavior: Behavior normal.        Thought Content: Thought content normal.

## 2023-12-08 NOTE — Assessment & Plan Note (Signed)
Afebrile.  No acute respiratory distress.  Concern for bacterial URI based on duration, adventitious lung sounds.  Pending chest x-ray.  Start Augmentin, Tessalon Perles, albuterol.  Advised to schedule albuterol due to wheezing.  She will let me know how she is doing.

## 2023-12-12 DIAGNOSIS — M549 Dorsalgia, unspecified: Secondary | ICD-10-CM | POA: Diagnosis not present

## 2023-12-14 DIAGNOSIS — M549 Dorsalgia, unspecified: Secondary | ICD-10-CM | POA: Diagnosis not present

## 2023-12-18 ENCOUNTER — Other Ambulatory Visit: Payer: Self-pay | Admitting: Family

## 2023-12-18 ENCOUNTER — Encounter: Payer: Self-pay | Admitting: Family

## 2023-12-18 DIAGNOSIS — J189 Pneumonia, unspecified organism: Secondary | ICD-10-CM

## 2023-12-18 MED ORDER — AZITHROMYCIN 250 MG PO TABS
ORAL_TABLET | ORAL | 0 refills | Status: AC
Start: 2023-12-18 — End: 2023-12-23

## 2023-12-19 ENCOUNTER — Other Ambulatory Visit: Payer: Self-pay | Admitting: *Deleted

## 2023-12-19 DIAGNOSIS — J189 Pneumonia, unspecified organism: Secondary | ICD-10-CM

## 2023-12-19 NOTE — Telephone Encounter (Signed)
 Left voicemail for pt to give office a call back to schedule 6 week chest xray. If pt calls back please go ahead and schedule. thx

## 2023-12-19 NOTE — Telephone Encounter (Signed)
 CXR ordered-please schedule on lab schedule for CXR in 6 weeks

## 2023-12-21 NOTE — Telephone Encounter (Signed)
 LVM to call back in regards to scheduling an appointment for a follow up xray. Please give the office a call to get this schedule. Our office # is [phone]

## 2023-12-22 ENCOUNTER — Other Ambulatory Visit: Payer: Self-pay

## 2023-12-22 ENCOUNTER — Ambulatory Visit: Payer: Medicare PPO | Admitting: Podiatry

## 2023-12-22 ENCOUNTER — Encounter: Payer: Self-pay | Admitting: Podiatry

## 2023-12-22 VITALS — Ht 60.0 in | Wt 139.6 lb

## 2023-12-22 DIAGNOSIS — M5416 Radiculopathy, lumbar region: Secondary | ICD-10-CM | POA: Diagnosis not present

## 2023-12-22 DIAGNOSIS — D2261 Melanocytic nevi of right upper limb, including shoulder: Secondary | ICD-10-CM | POA: Diagnosis not present

## 2023-12-22 DIAGNOSIS — D225 Melanocytic nevi of trunk: Secondary | ICD-10-CM | POA: Diagnosis not present

## 2023-12-22 DIAGNOSIS — D2262 Melanocytic nevi of left upper limb, including shoulder: Secondary | ICD-10-CM | POA: Diagnosis not present

## 2023-12-22 DIAGNOSIS — L821 Other seborrheic keratosis: Secondary | ICD-10-CM | POA: Diagnosis not present

## 2023-12-22 DIAGNOSIS — D2271 Melanocytic nevi of right lower limb, including hip: Secondary | ICD-10-CM | POA: Diagnosis not present

## 2023-12-22 DIAGNOSIS — B351 Tinea unguium: Secondary | ICD-10-CM | POA: Diagnosis not present

## 2023-12-22 DIAGNOSIS — M79676 Pain in unspecified toe(s): Secondary | ICD-10-CM

## 2023-12-22 DIAGNOSIS — D2272 Melanocytic nevi of left lower limb, including hip: Secondary | ICD-10-CM | POA: Diagnosis not present

## 2023-12-22 NOTE — Telephone Encounter (Signed)
 LETTER MAILED TO PT ON 12/22/2023

## 2023-12-25 ENCOUNTER — Telehealth: Payer: Self-pay

## 2023-12-25 NOTE — Telephone Encounter (Signed)
 Copied from CRM [id number]. Topic: General - Other >> Dec 25, 2023  2:58 PM Rodman Pickle T wrote: Reason for CRM: patient is calling in to schedule her xray she got a call from the office to schedule her appt she missed the call

## 2023-12-25 NOTE — Telephone Encounter (Signed)
 Sent pt my chart message and lvm also to  call back and schedule repeat chest xray in 6 weeks

## 2023-12-26 NOTE — Telephone Encounter (Signed)
 Pt has  repeat C xray scheduled for 01/19/2024

## 2023-12-26 NOTE — Telephone Encounter (Signed)
 See my chart message pt called back but was not scheduled, sent another my chart message and to E2C2 to schedule when pt calls back

## 2023-12-26 NOTE — Telephone Encounter (Signed)
 Attempted to call patient back, phone goes straight to voicemail.  Mychart message sent to patient asking her to call office to schedule appointment.    E2C2 x-ray is scheduled in our office on lab schedule.  If you are unable to schedule appointment please transfer to office  We cannot get in contact with this patient to schedule.

## 2023-12-26 NOTE — Telephone Encounter (Signed)
 Pt has scheduled  repeat c xray for 01/19/2024

## 2023-12-27 NOTE — Progress Notes (Signed)
  Subjective:  Patient ID: Claudia Andersen, female    DOB: 1941-06-03,  MRN: [id number]  83 y.o. female presents painful mycotic toenails x 10 which interfere with daily activities. Pain is relieved with periodic professional debridement. Chief Complaint  Patient presents with   Nail Problem    Pt is here for [facility] PCP is Dr Jason Coop and LOV was 2 weeks ago.    New problem(s): None   PCP is Arnett, Lyn Records, FNP.  Allergies  Allergen Reactions   Nsaids Other (See Comments)    Reflux symptoms    Review of Systems: Negative except as noted in the HPI.   Objective:  Claudia Andersen is a pleasant 83 y.o. female WD, WN in NAD. AAO x 3.  Vascular Examination: Vascular status intact b/l with palpable pedal pulses. CFT immediate b/l. Pedal hair present. No edema. No pain with calf compression b/l. Skin temperature gradient WNL b/l. No varicosities noted. No cyanosis or clubbing noted.  Neurological Examination: Sensation grossly intact b/l with 10 gram monofilament. Vibratory sensation intact b/l.  Dermatological Examination: Pedal skin with normal turgor, texture and tone b/l. No open wounds nor interdigital macerations noted. Toenails 1-5 left, 2, 3, 4 right thick, discolored, elongated with subungual debris and pain on dorsal palpation. No hyperkeratotic lesions noted b/l.   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 No corns, calluses nor porokeratotic lesions noted.  Musculoskeletal Examination: Muscle strength 5/5 to b/l LE.  No pain, crepitus noted b/l. 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 Patient ambulates independently without assistive aids.   Radiographs: None  Last A1c:       No data to display          Assessment:   1. Pain due to onychomycosis of toenail    Plan:  -Consent given for treatment as described below: -Examined patient. -Continue supportive shoe gear daily. -Toenails 1-5 left foot, right second digit, right third digit, and right fourth digit  were debrided in length and girth with sterile nail nippers and dremel. Pinpoint bleeding of left second digit addressed with Lumicain Hemostatic Solution, cleansed with alcohol. triple antibiotic ointment applied. No further treatment required by patient/caregiver. -Patient/POA to call should there be question/concern in the interim.  Return in about 3 months (around 03/23/2024).  Freddie Breech, DPM      [facility] LOCATION: [address] [place] [address]                   Office [phone]   [facility] LOCATION: [address] [place], [place] [address] Office [phone]

## 2024-01-03 DIAGNOSIS — M461 Sacroiliitis, not elsewhere classified: Secondary | ICD-10-CM | POA: Diagnosis not present

## 2024-01-04 DIAGNOSIS — M810 Age-related osteoporosis without current pathological fracture: Secondary | ICD-10-CM | POA: Diagnosis not present

## 2024-01-19 ENCOUNTER — Ambulatory Visit

## 2024-01-19 ENCOUNTER — Other Ambulatory Visit

## 2024-01-19 DIAGNOSIS — J189 Pneumonia, unspecified organism: Secondary | ICD-10-CM

## 2024-01-19 DIAGNOSIS — R918 Other nonspecific abnormal finding of lung field: Secondary | ICD-10-CM | POA: Diagnosis not present

## 2024-01-25 ENCOUNTER — Other Ambulatory Visit: Payer: Self-pay | Admitting: Family

## 2024-01-25 ENCOUNTER — Encounter: Payer: Self-pay | Admitting: Family

## 2024-01-25 DIAGNOSIS — M5416 Radiculopathy, lumbar region: Secondary | ICD-10-CM | POA: Diagnosis not present

## 2024-01-25 DIAGNOSIS — M791 Myalgia, unspecified site: Secondary | ICD-10-CM | POA: Diagnosis not present

## 2024-01-25 DIAGNOSIS — M7061 Trochanteric bursitis, right hip: Secondary | ICD-10-CM | POA: Diagnosis not present

## 2024-01-25 DIAGNOSIS — M48061 Spinal stenosis, lumbar region without neurogenic claudication: Secondary | ICD-10-CM | POA: Diagnosis not present

## 2024-01-25 DIAGNOSIS — R9389 Abnormal findings on diagnostic imaging of other specified body structures: Secondary | ICD-10-CM

## 2024-01-25 DIAGNOSIS — M47896 Other spondylosis, lumbar region: Secondary | ICD-10-CM | POA: Diagnosis not present

## 2024-01-25 DIAGNOSIS — M51369 Other intervertebral disc degeneration, lumbar region without mention of lumbar back pain or lower extremity pain: Secondary | ICD-10-CM | POA: Diagnosis not present

## 2024-01-29 ENCOUNTER — Telehealth: Payer: Self-pay | Admitting: Family

## 2024-01-29 NOTE — Telephone Encounter (Signed)
 Lft pt vm to call ofc to sch CT. thanks

## 2024-01-30 ENCOUNTER — Telehealth: Payer: Self-pay | Admitting: Family

## 2024-01-30 NOTE — Telephone Encounter (Signed)
 Lft pt vm to call ofc to sch CT. thanks

## 2024-02-07 DIAGNOSIS — M810 Age-related osteoporosis without current pathological fracture: Secondary | ICD-10-CM | POA: Diagnosis not present

## 2024-02-08 ENCOUNTER — Other Ambulatory Visit

## 2024-02-09 ENCOUNTER — Ambulatory Visit
Admission: RE | Admit: 2024-02-09 | Discharge: 2024-02-09 | Disposition: A | Discharge status: Home / Self Care | Source: Ambulatory Visit | Attending: Family | Admitting: Family

## 2024-02-09 DIAGNOSIS — R918 Other nonspecific abnormal finding of lung field: Secondary | ICD-10-CM | POA: Diagnosis not present

## 2024-02-09 DIAGNOSIS — J189 Pneumonia, unspecified organism: Secondary | ICD-10-CM | POA: Diagnosis not present

## 2024-02-09 DIAGNOSIS — I700 Atherosclerosis of aorta: Secondary | ICD-10-CM | POA: Diagnosis not present

## 2024-02-09 DIAGNOSIS — R9389 Abnormal findings on diagnostic imaging of other specified body structures: Secondary | ICD-10-CM | POA: Insufficient documentation

## 2024-02-09 MED ORDER — IOHEXOL 300 MG/ML  SOLN
75.0000 mL | Freq: Once | INTRAMUSCULAR | Status: AC | PRN
  Administered 2024-02-09: 75 mL via INTRAVENOUS

## 2024-02-14 ENCOUNTER — Other Ambulatory Visit: Payer: Self-pay | Admitting: Family

## 2024-02-20 ENCOUNTER — Other Ambulatory Visit: Payer: Self-pay | Admitting: Family

## 2024-02-20 DIAGNOSIS — M51379 Other intervertebral disc degeneration, lumbosacral region without mention of lumbar back pain or lower extremity pain: Secondary | ICD-10-CM

## 2024-02-28 ENCOUNTER — Ambulatory Visit: Payer: Self-pay | Admitting: Family

## 2024-02-28 DIAGNOSIS — I700 Atherosclerosis of aorta: Secondary | ICD-10-CM | POA: Insufficient documentation

## 2024-02-29 ENCOUNTER — Other Ambulatory Visit: Payer: Self-pay | Admitting: Family

## 2024-02-29 NOTE — Progress Notes (Signed)
 close

## 2024-03-01 NOTE — Telephone Encounter (Signed)
 Noted.

## 2024-03-05 ENCOUNTER — Encounter (INDEPENDENT_AMBULATORY_CARE_PROVIDER_SITE_OTHER): Payer: Self-pay

## 2024-03-07 DIAGNOSIS — M7061 Trochanteric bursitis, right hip: Secondary | ICD-10-CM | POA: Diagnosis not present

## 2024-03-07 DIAGNOSIS — M791 Myalgia, unspecified site: Secondary | ICD-10-CM | POA: Diagnosis not present

## 2024-03-07 DIAGNOSIS — M47896 Other spondylosis, lumbar region: Secondary | ICD-10-CM | POA: Diagnosis not present

## 2024-03-07 DIAGNOSIS — M654 Radial styloid tenosynovitis [de Quervain]: Secondary | ICD-10-CM | POA: Diagnosis not present

## 2024-03-07 DIAGNOSIS — M48061 Spinal stenosis, lumbar region without neurogenic claudication: Secondary | ICD-10-CM | POA: Diagnosis not present

## 2024-03-07 DIAGNOSIS — M51369 Other intervertebral disc degeneration, lumbar region without mention of lumbar back pain or lower extremity pain: Secondary | ICD-10-CM | POA: Diagnosis not present

## 2024-03-13 DIAGNOSIS — M810 Age-related osteoporosis without current pathological fracture: Secondary | ICD-10-CM | POA: Diagnosis not present

## 2024-03-18 ENCOUNTER — Encounter: Payer: Self-pay | Admitting: Family

## 2024-03-18 ENCOUNTER — Ambulatory Visit: Admitting: Family

## 2024-03-18 ENCOUNTER — Ambulatory Visit: Payer: Self-pay | Admitting: Family

## 2024-03-18 VITALS — BP 130/80 | Temp 97.7°F | Ht 60.0 in | Wt 142.0 lb

## 2024-03-18 DIAGNOSIS — Z1231 Encounter for screening mammogram for malignant neoplasm of breast: Secondary | ICD-10-CM

## 2024-03-18 DIAGNOSIS — K219 Gastro-esophageal reflux disease without esophagitis: Secondary | ICD-10-CM | POA: Diagnosis not present

## 2024-03-18 DIAGNOSIS — M810 Age-related osteoporosis without current pathological fracture: Secondary | ICD-10-CM | POA: Diagnosis not present

## 2024-03-18 DIAGNOSIS — I10 Essential (primary) hypertension: Secondary | ICD-10-CM | POA: Diagnosis not present

## 2024-03-18 DIAGNOSIS — E785 Hyperlipidemia, unspecified: Secondary | ICD-10-CM

## 2024-03-18 DIAGNOSIS — F325 Major depressive disorder, single episode, in full remission: Secondary | ICD-10-CM | POA: Diagnosis not present

## 2024-03-18 DIAGNOSIS — R300 Dysuria: Secondary | ICD-10-CM

## 2024-03-18 DIAGNOSIS — Z780 Asymptomatic menopausal state: Secondary | ICD-10-CM | POA: Diagnosis not present

## 2024-03-18 LAB — LIPID PANEL
Cholesterol: 164 mg/dL (ref 0–200)
HDL: 91.4 mg/dL (ref >39.00)
LDL Cholesterol: 61 mg/dL (ref 0–99)
NonHDL: 72.49
Total CHOL/HDL Ratio: 2
Triglycerides: 58 mg/dL (ref 0.0–149.0)
VLDL: 11.6 mg/dL (ref 0.0–40.0)

## 2024-03-18 LAB — COMPREHENSIVE METABOLIC PANEL WITH GFR
ALT: 18 U/L (ref 0–35)
AST: 23 U/L (ref 0–37)
Albumin: 4.3 g/dL (ref 3.5–5.2)
Alkaline Phosphatase: 81 U/L (ref 39–117)
BUN: 21 mg/dL (ref 6–23)
CO2: 27 meq/L (ref 19–32)
Calcium: 9.3 mg/dL (ref 8.4–10.5)
Chloride: 106 meq/L (ref 96–112)
Creatinine, Ser: 0.83 mg/dL (ref 0.40–1.20)
GFR: 65.94 mL/min (ref >60.00)
Glucose, Bld: 109 mg/dL — ABNORMAL HIGH (ref 70–99)
Potassium: 3.6 meq/L (ref 3.5–5.1)
Sodium: 142 meq/L (ref 135–145)
Total Bilirubin: 0.6 mg/dL (ref 0.2–1.2)
Total Protein: 6.8 g/dL (ref 6.0–8.3)

## 2024-03-18 LAB — TSH: TSH: 0.75 u[IU]/mL (ref 0.35–5.50)

## 2024-03-18 MED ORDER — FAMOTIDINE 20 MG PO TABS: 20.0000 mg | ORAL_TABLET | Freq: Two times a day (BID) | ORAL | 3 refills | Status: DC
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
## 2024-03-18 NOTE — Assessment & Plan Note (Signed)
  Chronic, suboptimal control.  Continue Protonix  40 mg daily.  Start Pepcid  AC 20 mg BID.

## 2024-03-18 NOTE — Assessment & Plan Note (Signed)
 Chronic, stable.  Continue Luvox  100 mg every morning, 50 mg qpm.

## 2024-03-18 NOTE — Patient Instructions (Signed)
Please let me know if vaginal bleeding persists   please call  and schedule your 3D mammogram and /or bone density scan as we discussed.   [facility]  ( new location in 2023)  [address], [place], [place] [address]  [place], [place]  [phone]   I have ordered transvaginal ultrasound.  Let us know if you dont hear back within a week in regards to an appointment being scheduled.   So that you are aware, if you are [facility] MyChart user , please pay attention to your MyChart messages as you may receive a MyChart message with a phone number to call and schedule this test/appointment own your own from our referral coordinator. This is a new process so I do not want you to miss this message.  If you are not a MyChart user, you will receive a phone call.   

## 2024-03-18 NOTE — Assessment & Plan Note (Signed)
 Patient describes subtle symptoms.  She politely declines  antibiotic therapy.  She would like to wait on urine culture

## 2024-03-18 NOTE — Assessment & Plan Note (Signed)
Chronic, stable.  Continue Lipitor 10 mg ?

## 2024-03-19 DIAGNOSIS — R300 Dysuria: Secondary | ICD-10-CM | POA: Diagnosis not present

## 2024-03-19 DIAGNOSIS — M1812 Unilateral primary osteoarthritis of first carpometacarpal joint, left hand: Secondary | ICD-10-CM | POA: Diagnosis not present

## 2024-03-19 LAB — URINALYSIS, ROUTINE W REFLEX MICROSCOPIC
Bilirubin Urine: NEGATIVE
Ketones, ur: NEGATIVE
Nitrite: POSITIVE — AB
Specific Gravity, Urine: 1.025 (ref 1.000–1.030)
Total Protein, Urine: 100 — AB
Urine Glucose: NEGATIVE
Urobilinogen, UA: 0.2 (ref 0.0–1.0)
pH: 6 (ref 5.0–8.0)

## 2024-03-20 ENCOUNTER — Other Ambulatory Visit: Payer: Self-pay | Admitting: Family

## 2024-03-20 ENCOUNTER — Encounter: Payer: Self-pay | Admitting: Family

## 2024-03-20 DIAGNOSIS — N3001 Acute cystitis with hematuria: Secondary | ICD-10-CM

## 2024-03-20 MED ORDER — NITROFURANTOIN MONOHYD MACRO 100 MG PO CAPS: 100.0000 mg | ORAL_CAPSULE | Freq: Two times a day (BID) | ORAL | 0 refills | Status: DC

## 2024-03-21 NOTE — Telephone Encounter (Signed)
 Noted

## 2024-03-22 ENCOUNTER — Other Ambulatory Visit: Payer: Self-pay

## 2024-03-22 ENCOUNTER — Ambulatory Visit: Admitting: Podiatry

## 2024-03-22 DIAGNOSIS — R319 Hematuria, unspecified: Secondary | ICD-10-CM

## 2024-03-22 LAB — URINE CULTURE
MICRO NUMBER:: 16532584
SPECIMEN QUALITY:: ADEQUATE

## 2024-03-23 ENCOUNTER — Other Ambulatory Visit: Payer: Self-pay | Admitting: Family

## 2024-03-28 DIAGNOSIS — M7061 Trochanteric bursitis, right hip: Secondary | ICD-10-CM | POA: Diagnosis not present

## 2024-03-28 DIAGNOSIS — M7062 Trochanteric bursitis, left hip: Secondary | ICD-10-CM | POA: Diagnosis not present

## 2024-04-04 DIAGNOSIS — L814 Other melanin hyperpigmentation: Secondary | ICD-10-CM | POA: Diagnosis not present

## 2024-04-04 DIAGNOSIS — L820 Inflamed seborrheic keratosis: Secondary | ICD-10-CM | POA: Diagnosis not present

## 2024-04-04 DIAGNOSIS — L821 Other seborrheic keratosis: Secondary | ICD-10-CM | POA: Diagnosis not present

## 2024-04-10 DIAGNOSIS — M25561 Pain in right knee: Secondary | ICD-10-CM | POA: Diagnosis not present

## 2024-04-10 DIAGNOSIS — M1711 Unilateral primary osteoarthritis, right knee: Secondary | ICD-10-CM | POA: Diagnosis not present

## 2024-04-15 DIAGNOSIS — M810 Age-related osteoporosis without current pathological fracture: Secondary | ICD-10-CM | POA: Diagnosis not present

## 2024-04-22 ENCOUNTER — Ambulatory Visit: Admitting: Cardiology

## 2024-04-22 ENCOUNTER — Telehealth: Payer: Self-pay | Admitting: Cardiology

## 2024-04-22 NOTE — Telephone Encounter (Signed)
 left vm to reschedule 04/22/24 appt due to provider out of office/GS 04/22/24

## 2024-04-29 ENCOUNTER — Ambulatory Visit: Payer: Self-pay | Admitting: *Deleted
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
## 2024-04-30 NOTE — Telephone Encounter (Signed)
 Has appt scheduled for 05/01/2024

## 2024-04-30 NOTE — Telephone Encounter (Signed)
 LMTCB. Need to see about getting pt scheduled with her PCP.

## 2024-05-01 ENCOUNTER — Ambulatory Visit: Admitting: Family

## 2024-05-01 ENCOUNTER — Encounter: Payer: Self-pay | Admitting: Family

## 2024-05-01 VITALS — BP 122/72 | HR 74 | Temp 98.5°F | Ht 60.0 in | Wt 139.8 lb

## 2024-05-01 DIAGNOSIS — F32A Depression, unspecified: Secondary | ICD-10-CM | POA: Diagnosis not present

## 2024-05-01 DIAGNOSIS — R319 Hematuria, unspecified: Secondary | ICD-10-CM

## 2024-05-01 DIAGNOSIS — R197 Diarrhea, unspecified: Secondary | ICD-10-CM | POA: Diagnosis not present

## 2024-05-01 LAB — URINALYSIS, ROUTINE W REFLEX MICROSCOPIC
Bilirubin Urine: NEGATIVE
Hgb urine dipstick: NEGATIVE
Ketones, ur: NEGATIVE
Leukocytes,Ua: NEGATIVE
Nitrite: NEGATIVE
Specific Gravity, Urine: 1.025 (ref 1.000–1.030)
Urine Glucose: NEGATIVE
Urobilinogen, UA: 0.2 (ref 0.0–1.0)
pH: 6 (ref 5.0–8.0)

## 2024-05-01 MED ORDER — DULOXETINE HCL 60 MG PO CPEP: 60.0000 mg | ORAL_CAPSULE | Freq: Every day | ORAL | 3 refills | Status: DC
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
## 2024-05-01 NOTE — Patient Instructions (Addendum)
 STOP luvox  after today  Start cymbalta  60mg  tomorrow  Return stool to [facility].   Low-FODMAP Eating Plan  FODMAP stands for fermentable oligosaccharides, disaccharides, monosaccharides, and polyols. These are sugars that are hard for some people to digest. A low-FODMAP eating plan may help some people who have irritable bowel syndrome (IBS) and certain other bowel (intestinal) diseases to manage their symptoms. This meal plan can be complicated to follow. Work with a diet and nutrition specialist (dietitian) to make a low-FODMAP eating plan that is right for you. A dietitian can help make sure that you get enough nutrition from this diet. What are tips for following this plan? Reading food labels Check labels for hidden FODMAPs such as: High-fructose syrup. Honey. Agave. Natural fruit flavors. Onion or garlic powder. Choose low-FODMAP foods that contain 3-4 grams of fiber per serving. Check food labels for serving sizes. Eat only one serving at a time to make sure FODMAP levels stay low. Shopping Shop with a list of foods that are recommended on this diet and make a meal plan. Meal planning Follow a low-FODMAP eating plan for up to 6 weeks, or as told by your health care provider or dietitian. To follow the eating plan: Eliminate high-FODMAP foods from your diet completely. Choose only low-FODMAP foods to eat. You will do this for 2-6 weeks. Gradually reintroduce high-FODMAP foods into your diet one at a time. Most people should wait a few days before introducing the next new high-FODMAP food into their meal plan. Your dietitian can recommend how quickly you may reintroduce foods. Keep a daily record of what and how much you eat and drink. Make note of any symptoms that you have after eating. Review your daily record with a dietitian regularly to identify which foods you can eat and which foods you should avoid. General tips Drink enough fluid each day to keep your urine pale  yellow. Avoid processed foods. These often have added sugar and may be high in FODMAPs. Avoid most dairy products, whole grains, and sweeteners. Work with a dietitian to make sure you get enough fiber in your diet. Avoid high FODMAP foods at meals to manage symptoms. Recommended foods Fruits Bananas, oranges, tangerines, lemons, limes, blueberries, raspberries, strawberries, grapes, cantaloupe, honeydew melon, kiwi, papaya, passion fruit, and pineapple. Limited amounts of dried cranberries, banana chips, and shredded coconut. Vegetables Eggplant, zucchini, cucumber, peppers, green beans, bean sprouts, lettuce, arugula, kale, Swiss chard, spinach, collard greens, bok choy, summer squash, potato, and tomato. Limited amounts of corn, carrot, and sweet potato. Green parts of scallions. Grains Gluten-free grains, such as rice, oats, buckwheat, quinoa, corn, polenta, and millet. Gluten-free pasta, bread, or cereal. Rice noodles. Corn tortillas. Meats and other proteins Unseasoned beef, pork, poultry, or fish. Eggs. [place]. Tofu (firm) and tempeh. Limited amounts of nuts and seeds, such as almonds, walnuts, [place] nuts, pecans, peanuts, nut butters, pumpkin seeds, chia seeds, and sunflower seeds. Dairy Lactose-free milk, yogurt, and kefir. Lactose-free cottage cheese and ice cream. Non-dairy milks, such as almond, coconut, hemp, and rice milk. Non-dairy yogurt. Limited amounts of goat cheese, brie, mozzarella, parmesan, swiss, and other hard cheeses. Fats and oils Butter-free spreads. Vegetable oils, such as olive, canola, and sunflower oil. Seasoning and other foods Artificial sweeteners with names that do not end in ol, such as aspartame, saccharine, and stevia. Maple syrup, white table sugar, raw sugar, brown sugar, and molasses. Mayonnaise, soy sauce, and tamari. Fresh basil, coriander, parsley, rosemary, and thyme. Beverages Water and mineral water.  Sugar-sweetened soft drinks. Small amounts of  orange juice or cranberry juice. Black and green tea. Most dry wines. Coffee. The items listed above may not be a complete list of foods and beverages you can eat. Contact a dietitian for more information. Foods to avoid Fruits Fresh, dried, and juiced forms of apple, pear, watermelon, peach, plum, cherries, apricots, blackberries, boysenberries, figs, nectarines, and mango. Avocado. Vegetables Chicory root, artichoke, asparagus, cabbage, snow peas, Brussels sprouts, broccoli, sugar snap peas, mushrooms, celery, and cauliflower. Onions, garlic, leeks, and the white part of scallions. Grains Wheat, including kamut, durum, and semolina. Barley and bulgur. Couscous. Wheat-based cereals. Wheat noodles, bread, crackers, and pastries. Meats and other proteins Fried or fatty meat. Sausage. Cashews and pistachios. Soybeans, baked beans, black beans, chickpeas, kidney beans, fava beans, navy beans, lentils, black-eyed peas, and split peas. Dairy Milk, yogurt, ice cream, and soft cheese. Cream and sour cream. Milk-based sauces. Custard. Buttermilk. Soy milk. Seasoning and other foods Any sugar-free gum or candy. Foods that contain artificial sweeteners such as sorbitol, mannitol, isomalt, or xylitol. Foods that contain honey, high-fructose corn syrup, or agave. Bouillon, vegetable stock, beef stock, and chicken stock. Garlic and onion powder. Condiments made with onion, such as hummus, chutney, pickles, relish, salad dressing, and salsa. Tomato paste. Beverages Chicory-based drinks. Coffee substitutes. Chamomile tea. Fennel tea. Sweet or fortified wines such as port or sherry. Diet soft drinks made with isomalt, mannitol, maltitol, sorbitol, or xylitol. Apple, pear, and mango juice. Juices with high-fructose corn syrup. The items listed above may not be a complete list of foods and beverages you should avoid. Contact a dietitian for more information. Summary FODMAP stands for fermentable oligosaccharides,  disaccharides, monosaccharides, and polyols. These are sugars that are hard for some people to digest. A low-FODMAP eating plan is a short-term diet that helps to ease symptoms of certain bowel diseases. The eating plan usually lasts up to 6 weeks. After that, high-FODMAP foods are reintroduced gradually and one at a time. This can help you find out which foods may be causing symptoms. A low-FODMAP eating plan can be complicated. It is best to work with a dietitian who has experience with this type of plan. This information is not intended to replace advice given to you by your health care provider. Make sure you discuss any questions you have with your health care provider. Document Revised: 09/17/2023 Document Reviewed: 09/17/2023 Elsevier Patient Education  2025 [employer].

## 2024-05-02 ENCOUNTER — Encounter: Payer: Self-pay | Admitting: Family

## 2024-05-04 ENCOUNTER — Other Ambulatory Visit
Admission: RE | Admit: 2024-05-04 | Discharge: 2024-05-04 | Disposition: A | Discharge status: Home / Self Care | Source: Ambulatory Visit | Attending: Family | Admitting: Family

## 2024-05-04 DIAGNOSIS — R319 Hematuria, unspecified: Secondary | ICD-10-CM | POA: Diagnosis not present

## 2024-05-04 DIAGNOSIS — R197 Diarrhea, unspecified: Secondary | ICD-10-CM | POA: Insufficient documentation

## 2024-05-04 LAB — C DIFFICILE QUICK SCREEN W PCR REFLEX
C Diff antigen: NEGATIVE
C Diff interpretation: NOT DETECTED
C Diff toxin: NEGATIVE

## 2024-05-05 LAB — GI PATHOGEN PANEL BY PCR, STOOL

## 2024-05-05 LAB — CELIAC DISEASE AB SCREEN W/RFX
Antigliadin Abs, IgA: 3 U (ref 0–19)
IgA/Immunoglobulin A, Serum: 140 mg/dL (ref 64–422)
Transglutaminase IgA: <2 U/mL (ref 0–3)

## 2024-05-05 NOTE — Assessment & Plan Note (Addendum)
 No alarm features at this time .D/D infectious etiology, IBS, celiac disease. Pending lab, stool studies. Close follow up.

## 2024-05-05 NOTE — Assessment & Plan Note (Addendum)
 Chronic, poor control.  Advised to stop Luvox   and to start Cymbalta  60mg 

## 2024-05-06 ENCOUNTER — Ambulatory Visit: Admitting: Internal Medicine

## 2024-05-06 ENCOUNTER — Ambulatory Visit: Payer: Self-pay | Admitting: Family

## 2024-05-06 DIAGNOSIS — R319 Hematuria, unspecified: Secondary | ICD-10-CM

## 2024-05-07 LAB — CALPROTECTIN, FECAL: Calprotectin, Fecal: 12 ug/g (ref 0–120)

## 2024-05-16 DIAGNOSIS — M810 Age-related osteoporosis without current pathological fracture: Secondary | ICD-10-CM | POA: Diagnosis not present

## 2024-05-31 ENCOUNTER — Ambulatory Visit: Attending: Cardiology | Admitting: Cardiology

## 2024-05-31 ENCOUNTER — Encounter: Payer: Self-pay | Admitting: Cardiology

## 2024-05-31 VITALS — BP 125/50 | HR 67 | Ht 60.0 in | Wt 142.2 lb

## 2024-05-31 DIAGNOSIS — I2510 Atherosclerotic heart disease of native coronary artery without angina pectoris: Secondary | ICD-10-CM

## 2024-05-31 DIAGNOSIS — I10 Essential (primary) hypertension: Secondary | ICD-10-CM

## 2024-05-31 MED ORDER — ASPIRIN 81 MG PO TBEC: 81.0000 mg | DELAYED_RELEASE_TABLET | Freq: Every day | ORAL | Status: AC

## 2024-05-31 NOTE — Patient Instructions (Signed)
 Medication Instructions:   Your physician recommends the following medication changes.  START TAKING: Aspirin  81 mg by mouth daily  *If you need a refill on your cardiac medications before your next appointment, please call your pharmacy*  Lab Work:  No labs ordered today   If you have labs (blood work) drawn today and your tests are completely normal, you will receive your results only by: MyChart Message (if you have MyChart) OR A paper copy in the mail If you have any lab test that is abnormal or we need to change your treatment, we will call you to review the results.  Testing/Procedures:  Your physician has requested that you have an echocardiogram. Echocardiography is a painless test that uses sound waves to create images of your heart. It provides your doctor with information about the size and shape of your heart and how well your heart's chambers and valves are working.   You may receive an ultrasound enhancing agent through an IV if needed to better visualize your heart during the echo. This procedure takes approximately one hour.  There are no restrictions for this procedure.  This will take place at [address] [facility]) [address], [place] [address]  Please note: We ask at that you not bring children with you during ultrasound (echo/ vascular) testing. Due to room size and safety concerns, children are not allowed in the ultrasound rooms during exams. Our front office staff cannot provide observation of children in our lobby area while testing is being conducted. An adult accompanying a patient to their appointment will only be allowed in the ultrasound room at the discretion of the ultrasound technician under special circumstances. We apologize for any inconvenience.   Follow-Up: At [facility], you and your health needs are our priority.  As part of our continuing mission to provide you with exceptional heart care, our providers are all part of  one team.  This team includes your primary Cardiologist (physician) and Advanced Practice Providers or APPs (Physician Assistants and Nurse Practitioners) who all work together to provide you with the care you need, when you need it.  Your next appointment:   3 month(s)  Provider:   You may see Redell Cave, MD or one of the following Advanced Practice Providers on your designated Care Team:   Lonni Meager, NP Lesley Maffucci, PA-C Bernardino Bring, PA-C Cadence [place], PA-C Tylene Lunch, NP Barnie Hila, NP   We recommend signing up for the patient portal called MyChart.  Sign up information is provided on this After Visit Summary.  MyChart is used to connect with patients for Virtual Visits (Telemedicine).  Patients are able to view lab/test results, encounter notes, upcoming appointments, etc.  Non-urgent messages can be sent to your provider as well.   To learn more about what you can do with MyChart, go to [email or web address].

## 2024-05-31 NOTE — Progress Notes (Addendum)
 Cardiology Office Note:    Date:  05/31/2024   ID:  Claudia Andersen, DOB 11-Sep-1941, MRN [id number]  PCP:  Dineen Rollene MATSU, FNP   [facility] Providers Cardiologist:  None     Referring MD: Dineen Rollene MATSU, FNP   Chief Complaint  Patient presents with   New Patient (Initial Visit)    Pt doing great, her doctor make the appointment.    History of Present Illness:    Claudia Andersen is a 83 y.o. female with a hx of CAD (LAD calcifications on chest CT ), hypertension, hyperlipidemia, former smoker x 20 years who presents with aortic atherosclerosis.  Patient had a chest CT performed 02/09/2024 due to history of right lower lobe pneumonia, aortic and LAD calcifications noted.  She denies chest pain or shortness of breath.  Father had a heart attack in his 8s.  Feels well, compliant with medications for BP and cholesterol as prescribed.  Has no concerns at this time.  Past Medical History:  Diagnosis Date   Ankle fracture, bimalleolar, closed, right, initial encounter 10/26/2018   d/t fall 10/26/18   Arthritis    generalized   Back abrasion    CRUSHED VERTEBRAE   Cataract    bilateral sx    Closed fracture of shaft of fibula 05/11/2020   Closed right radial fracture 10/26/2018   s/p a fall   Compression of lumbar vertebra (HCC)    L1 and L4 (from fall November 05, 2018)   Degenerative disc disease, lumbar    Depression    hx of-on meds   Gallstones    GERD (gastroesophageal reflux disease)    on meds   Migraines    Osteoporosis    on Prolia  inj   Pulmonary emboli (HCC)    history of emboli    Past Surgical History:  Procedure Laterality Date   ABDOMINAL HYSTERECTOMY     APPENDECTOMY     CATARACT EXTRACTION W/PHACO Right 03/27/2018   Procedure: CATARACT EXTRACTION PHACO AND INTRAOCULAR LENS PLACEMENT (IOC);  Surgeon: Jaye Fallow, MD;  Location: [facility];  Service: Ophthalmology;  Laterality: Right;  US  00:33 AP% 15.3 CDE 5.18 Fluid pack lot #  [id number]   CATARACT EXTRACTION W/PHACO Left 06/26/2018   Procedure: CATARACT EXTRACTION PHACO AND INTRAOCULAR LENS PLACEMENT (IOC);  Surgeon: Jaye Fallow, MD;  Location: [facility];  Service: Ophthalmology;  Laterality: Left;  US  00:38 AP% 15.2 CDE 5.80 Fluid pack lot # [id number]   FRACTURE SURGERY Right    RIGHT RADIAL wrist   METATARSAL HEAD EXCISION Right 04/01/2021   Procedure: METATARSAL HEAD EXCISION- Tailors Exostectomy -Right;  Surgeon: Lennie Barter, DPM;  Location: [facility];  Service: Podiatry;  Laterality: Right;  CANNOT arrive before 9:30 Anesthesia- Mac + local   TONSILLECTOMY AND ADENOIDECTOMY     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   Procedure: TOTAL HIP ARTHROPLASTY ANTERIOR APPROACH;  Surgeon: Leora Lynwood SAUNDERS, MD;  Location: [facility];  Service: Orthopedics;  Laterality: Right;   TOTAL KNEE ARTHROPLASTY Left    TRIPLE SUBTALAR FUSION     WISDOM TOOTH EXTRACTION      Current Medications: Current Meds  Medication Sig   acetaminophen  (TYLENOL ) 650 MG CR tablet Take 1,300 mg by mouth 2 (two) times daily.   aspirin  EC 81 MG tablet Take 1 tablet (81 mg total) by mouth daily. Swallow whole.   atorvastatin  (LIPITOR) 10 MG tablet TAKE ONE TABLET BY MOUTH EVERY DAY   baclofen  (LIORESAL ) 10 MG tablet  TAKE 1 TABLET BY MOUTH 3 TIMES DAILY AS NEEDED FOR MUSCLE SPASM   Biotin 2500 MCG CAPS Take 2,500 mcg by mouth daily.   Calcium  Carb-Cholecalciferol (CALCIUM  600 + D PO) Take 1 tablet by mouth daily.   Clotrimazole  1 % OINT Apply 1 application topically 2 (two) times daily.   diclofenac  Sodium (VOLTAREN ) 1 % GEL Apply 4 g topically 4 (four) times daily.   DULoxetine  (CYMBALTA ) 60 MG capsule Take 1 capsule (60 mg total) by mouth daily.   famotidine  (PEPCID ) 20 MG tablet Take 1 tablet (20 mg total) by mouth 2 (two) times daily.   Fluticasone  Furoate (FLONASE  SENSIMIST NA) Place 1 spray into both nostrils daily as needed (allergies).   gabapentin  (NEURONTIN ) 100 MG  capsule TAKE 2 CAPSULES BY MOUTH EVERY MORNING, AND 2 CAPSULES IN MIDDAY.   gabapentin  (NEURONTIN ) 300 MG capsule Take 1 capsule (300 mg total) by mouth every evening.   Homeopathic Products (ARNICA EX) Apply 1 Application topically as needed (apply to broken skin as needed.).   hydrochlorothiazide  (MICROZIDE ) 12.5 MG capsule Take 1 capsule (12.5 mg total) by mouth daily as needed.   Lactobacillus (PROBIOTIC ACIDOPHILUS PO) Take 1 tablet by mouth daily.   lidocaine  (LIDODERM ) 5 % Place 1 patch onto the skin as needed (1 every 24 hours). Remove & Discard patch within 12 hours or as directed by MD   Multiple Vitamin (MULTIVITAMIN) tablet Take 1 tablet by mouth daily.   Multiple Vitamins-Minerals (ICAPS AREDS FORMULA PO) Take 1 tablet by mouth 2 (two) times daily.   mupirocin  ointment (BACTROBAN ) 2 % Apply 1 application topically 2 (two) times daily.   nitrofurantoin , macrocrystal-monohydrate, (MACROBID ) 100 MG capsule Take 1 capsule (100 mg total) by mouth 2 (two) times daily. Take with food.   pantoprazole  (PROTONIX ) 40 MG tablet TAKE ONE TABLET (40 MG) BY MOUTH EVERY DAY 1 HOUR BEFORE BREAKFAST   traZODone  (DESYREL ) 50 MG tablet TAKE TWO TABLETS BY MOUTH AT BEDTIME   triamcinolone  (KENALOG ) 0.025 % cream Apply 1 application topically 2 (two) times daily.   valACYclovir  (VALTREX ) 1000 MG tablet TAKE TWO TABLETS BY MOUTH EVERY 12 HOURSFOR 1 DAY. **START AS SOON AS POSSIBLE AFTER SYMPTOM ONSET**     Allergies:   Nsaids   Social History   Socioeconomic History   Marital status: Divorced    Spouse name: Not on file   Number of children: 1   Years of education: Not on file   Highest education level: Not on file  Occupational History   Occupation: retired  Tobacco Use   Smoking status: Former    Current packs/day: 0.00    Average packs/day: 0.5 packs/day for 22.0 years (11.0 ttl pk-yrs)    Types: Cigarettes    Start date: 65    Quit date: 1982    Years since quitting: 43.6   Smokeless  tobacco: Never  Vaping Use   Vaping status: Never Used  Substance and Sexual Activity   Alcohol use: Yes    Alcohol/week: 10.0 standard drinks of alcohol    Types: 10 Standard drinks or equivalent per week   Drug use: Never   Sexual activity: Not on file  Other Topics Concern   Not on file  Social History Narrative   Retired Museum/gallery conservator      From [place].    Lives at [facility]   Social Drivers of Health   Financial Resource Strain: Low Risk  (10/26/2021)   Overall Financial Resource Strain (CARDIA)  Difficulty of Paying Living Expenses: Not hard at all  Food Insecurity: No Food Insecurity (10/26/2021)   Hunger Vital Sign    Worried About Running Out of Food in the Last Year: Never true    Ran Out of Food in the Last Year: Never true  Transportation Needs: No Transportation Needs (10/26/2021)   PRAPARE - Administrator, Civil Service (Medical): No    Lack of Transportation (Non-Medical): No  Physical Activity: Sufficiently Active (10/26/2021)   Exercise Vital Sign    Days of Exercise per Week: 7 days    Minutes of Exercise per Session: 60 min  Stress: No Stress Concern Present (10/26/2021)   [employer] of Occupational Health - Occupational Stress Questionnaire    Feeling of Stress : Not at all  Social Connections: Not on file     Family History: The patient's family history includes Arthritis in her mother; Heart disease in her maternal grandfather, paternal grandfather, and paternal grandmother; Heart disease (age of onset: 37) in her father; Hemachromatosis in her mother; Pneumonia in her maternal grandmother; Stroke (age of onset: 53) in her mother. There is no history of Breast cancer, Colon cancer, Colon polyps, Esophageal cancer, Rectal cancer, or Stomach cancer.  ROS:   Please see the history of present illness.     All other systems reviewed and are negative.  EKGs/Labs/Other Studies Reviewed:    The following studies were reviewed today:  EKG  Interpretation Date/Time:  Friday May 31 2024 08:58:45 EDT Ventricular Rate:  67 PR Interval:  210 QRS Duration:  78 QT Interval:  426 QTC Calculation: 450 R Axis:   -34  Text Interpretation: Sinus rhythm with 1st degree A-V block Left axis deviation Nonspecific T wave abnormality Confirmed by Darliss Rogue ([id number]) on 05/31/2024 9:02:54 AM    Recent Labs: 03/18/2024: ALT 18; BUN 21; Creatinine, Ser 0.83; Potassium 3.6; Sodium 142; TSH 0.75  Recent Lipid Panel    Component Value Date/Time   CHOL 164 03/18/2024 0924   TRIG 58.0 03/18/2024 0924   HDL 91.40 03/18/2024 0924   CHOLHDL 2 03/18/2024 0924   VLDL 11.6 03/18/2024 0924   LDLCALC 61 03/18/2024 0924     Risk Assessment/Calculations:             Physical Exam:    VS:  BP (!) 125/50 (BP Location: Left Arm, Patient Position: Sitting, Cuff Size: Normal)   Pulse 67   Ht 5' (1.524 m)   Wt 142 lb 3.2 oz (64.5 kg)   SpO2 98%   BMI 27.77 kg/m     Wt Readings from Last 3 Encounters:  05/31/24 142 lb 3.2 oz (64.5 kg)  05/01/24 139 lb 12.8 oz (63.4 kg)  03/18/24 142 lb (64.4 kg)     GEN:  Well nourished, well developed in no acute distress HEENT: Normal NECK: No JVD; No carotid bruits CARDIAC: RRR, no murmurs, rubs, gallops RESPIRATORY:  Clear to auscultation without rales, wheezing or rhonchi  ABDOMEN: Soft, non-tender, non-distended MUSCULOSKELETAL:  No edema; No deformity  SKIN: Warm and dry NEUROLOGIC:  Alert and oriented x 3 PSYCHIATRIC:  Normal affect   ASSESSMENT:    1. Coronary artery disease involving native coronary artery of native heart, unspecified whether angina present   2. Primary hypertension    PLAN:    In order of problems listed above:  LAD calcification, aortic atherosclerosis on chest CT.  Denies chest pain.  Cholesterol controlled, LDL at goal.  Start aspirin  81 mg daily,  continue Lipitor 10 mg daily.  Obtain echocardiogram.  Consider ischemic workup if patient develops anginal  symptoms. Hypertension, BP controlled.  Continue HCTZ 12.5 mg daily.  Follow-up after echocardiogram.     Medication Adjustments/Labs and Tests Ordered: Current medicines are reviewed at length with the patient today.  Concerns regarding medicines are outlined above.  Orders Placed This Encounter  Procedures   EKG 12-Lead   ECHOCARDIOGRAM COMPLETE   Meds ordered this encounter  Medications   aspirin  EC 81 MG tablet    Sig: Take 1 tablet (81 mg total) by mouth daily. Swallow whole.    Patient Instructions  Medication Instructions:   Your physician recommends the following medication changes.  START TAKING: Aspirin  81 mg by mouth daily  *If you need a refill on your cardiac medications before your next appointment, please call your pharmacy*  Lab Work:  No labs ordered today   If you have labs (blood work) drawn today and your tests are completely normal, you will receive your results only by: MyChart Message (if you have MyChart) OR A paper copy in the mail If you have any lab test that is abnormal or we need to change your treatment, we will call you to review the results.  Testing/Procedures:  Your physician has requested that you have an echocardiogram. Echocardiography is a painless test that uses sound waves to create images of your heart. It provides your doctor with information about the size and shape of your heart and how well your heart's chambers and valves are working.   You may receive an ultrasound enhancing agent through an IV if needed to better visualize your heart during the echo. This procedure takes approximately one hour.  There are no restrictions for this procedure.  This will take place at [address] [facility] [address], [place] [address]  Please note: We ask at that you not bring children with you during ultrasound (echo/ vascular) testing. Due to room size and safety concerns, children are not allowed in the ultrasound rooms  during exams. Our front office staff cannot provide observation of children in our lobby area while testing is being conducted. An adult accompanying a patient to their appointment will only be allowed in the ultrasound room at the discretion of the ultrasound technician under special circumstances. We apologize for any inconvenience.   Follow-Up: At [facility], you and your health needs are our priority.  As part of our continuing mission to provide you with exceptional heart care, our providers are all part of one team.  This team includes your primary Cardiologist (physician) and Advanced Practice Providers or APPs (Physician Assistants and Nurse Practitioners) who all work together to provide you with the care you need, when you need it.  Your next appointment:   3 month(s)  Provider:   You may see Redell Cave, MD or one of the following Advanced Practice Providers on your designated Care Team:   Lonni Meager, NP Lesley Maffucci, PA-C Bernardino Bring, PA-C Cadence Cottonwood, PA-C Tylene Lunch, NP Barnie Hila, NP   We recommend signing up for the patient portal called MyChart.  Sign up information is provided on this After Visit Summary.  MyChart is used to connect with patients for Virtual Visits (Telemedicine).  Patients are able to view lab/test results, encounter notes, upcoming appointments, etc.  Non-urgent messages can be sent to your provider as well.   To learn more about what you can do with MyChart, go to [email or web address].  Signed, Redell Cave, MD  05/31/2024 10:17 AM    [facility]

## 2024-06-10 ENCOUNTER — Ambulatory Visit: Admitting: Cardiology

## 2024-06-10 ENCOUNTER — Ambulatory Visit
Admission: RE | Admit: 2024-06-10 | Discharge: 2024-06-10 | Disposition: A | Discharge status: Home / Self Care | Source: Ambulatory Visit | Attending: Family | Admitting: Family

## 2024-06-10 DIAGNOSIS — M1812 Unilateral primary osteoarthritis of first carpometacarpal joint, left hand: Secondary | ICD-10-CM | POA: Diagnosis not present

## 2024-06-10 DIAGNOSIS — Z1231 Encounter for screening mammogram for malignant neoplasm of breast: Secondary | ICD-10-CM | POA: Insufficient documentation

## 2024-06-10 DIAGNOSIS — M654 Radial styloid tenosynovitis [de Quervain]: Secondary | ICD-10-CM | POA: Diagnosis not present

## 2024-06-10 DIAGNOSIS — M810 Age-related osteoporosis without current pathological fracture: Secondary | ICD-10-CM | POA: Insufficient documentation

## 2024-06-10 DIAGNOSIS — Z780 Asymptomatic menopausal state: Secondary | ICD-10-CM | POA: Insufficient documentation

## 2024-06-18 ENCOUNTER — Ambulatory Visit: Admitting: Family

## 2024-06-27 DIAGNOSIS — M1711 Unilateral primary osteoarthritis, right knee: Secondary | ICD-10-CM | POA: Diagnosis not present

## 2024-06-27 DIAGNOSIS — M25561 Pain in right knee: Secondary | ICD-10-CM | POA: Diagnosis not present

## 2024-07-02 DIAGNOSIS — M810 Age-related osteoporosis without current pathological fracture: Secondary | ICD-10-CM | POA: Diagnosis not present

## 2024-07-03 ENCOUNTER — Ambulatory Visit: Attending: Cardiology

## 2024-07-03 DIAGNOSIS — I2510 Atherosclerotic heart disease of native coronary artery without angina pectoris: Secondary | ICD-10-CM

## 2024-07-03 LAB — ECHOCARDIOGRAM COMPLETE
AR max vel: 1.56 cm2
AV Area VTI: 1.44 cm2
AV Area mean vel: 1.4 cm2
AV Mean grad: 10 mmHg
AV Peak grad: 18.8 mmHg
Ao pk vel: 2.17 m/s
Area-P 1/2: 2.96 cm2
Calc EF: 59.9 %
MV VTI: 2.09 cm2
S' Lateral: 3.1 cm
Single Plane A2C EF: 57.6 %
Single Plane A4C EF: 61.7 %

## 2024-07-04 ENCOUNTER — Ambulatory Visit: Payer: Self-pay | Admitting: Cardiology

## 2024-07-04 ENCOUNTER — Ambulatory Visit: Admitting: Family

## 2024-07-04 ENCOUNTER — Encounter: Payer: Self-pay | Admitting: Family

## 2024-07-04 VITALS — BP 124/68 | HR 87 | Temp 97.6°F | Ht 59.0 in | Wt 139.2 lb

## 2024-07-04 DIAGNOSIS — R319 Hematuria, unspecified: Secondary | ICD-10-CM | POA: Diagnosis not present

## 2024-07-04 DIAGNOSIS — Z23 Encounter for immunization: Secondary | ICD-10-CM | POA: Diagnosis not present

## 2024-07-04 DIAGNOSIS — Z66 Do not resuscitate: Secondary | ICD-10-CM

## 2024-07-04 DIAGNOSIS — M47816 Spondylosis without myelopathy or radiculopathy, lumbar region: Secondary | ICD-10-CM | POA: Diagnosis not present

## 2024-07-04 DIAGNOSIS — F32A Depression, unspecified: Secondary | ICD-10-CM

## 2024-07-04 NOTE — Patient Instructions (Signed)
 My pleasure seeing you today and always

## 2024-07-04 NOTE — Progress Notes (Signed)
 Assessment & Plan:  DNR (do not resuscitate) Assessment & Plan: We had a long discussion in regards to what DO NOT RESUSCITATE order would mean.  Patient does not want  the use of artificial or heroic means to be kept alive. Patient would like me to write an order in the chart that if patient was to die, no attempt to resuscitate will be made.  Patient doesn't want intubation, CPR, ACLS or defibrillation. She would like comfort care and has completed this in her Living will. Patient's  daughter    is her health care power of attorney.  All questions answered and patient verbalized understanding of all. I have signed MOST form and DO NOT RESUSCITATE order and provided patient with original copy.  Forms scanned to chart today. Counseled patient on the importance of keeping DNR paperwork at home and visible so that people around patient are aware   Orders: -     Do not attempt resuscitation (DNR)  Hematuria, unspecified type -     Urine Culture -     Urinalysis, Routine w reflex microscopic  Need for influenza vaccination  Lumbar spondylosis -     Ambulatory referral to Physical Therapy  Depressive disorder Assessment & Plan: Chronic, improved. No longer on luvox . Continue cymbalta  60mg  qd      Return precautions given.   Risks, benefits, and alternatives of the medications and treatment plan prescribed today were discussed, and patient expressed understanding.   Education regarding symptom management and diagnosis given to patient on AVS either electronically or printed.  No follow-ups on file.  Rollene Northern, FNP  Subjective:    Patient ID: Claudia Andersen, female    DOB: [birth year], 83 y.o.   MRN: [id number]  CC: Claudia Andersen is a 83 y.o. female who presents today for follow up.   HPI: Discussed the use of AI scribe software for clinical note transcription with the patient, who gave verbal consent to proceed.   Claudia Andersen is an 83 year old female who  presents for follow-up regarding medication changes and chronic pain management.She has experienced improvement in her chronic pain since switching from Luvox  to Cymbalta  60 mg. She is pleased with the results and continues to take the medication as prescribed.She identified that her diarrhea was due to excessive intake of pills, which she has since adjusted, leading to resolution of the diarrhea. She uses simethicone as needed and is cautious with its use.  She has a DNR order at [facility] and here today to complete MOST form and DNR.  She has extensively considered her end-of-life preferences. She is grateful for her long life and doesn't want CPR.   She does not want life-saving measures . She desires comfort care. She has a healthcare power of attorney in place with her daughter as the designated person.   Low back pain controlled with lidocaine  patches.  She is seeking a referral for physical therapy to focus on balance, energy, and increasing stamina. She resides at [facility], which has a gym, but feels PT would encourage her to exercise.           Allergies: Nsaids Current Outpatient Medications on File Prior to Visit  Medication Sig Dispense Refill   acetaminophen  (TYLENOL ) 650 MG CR tablet Take 1,300 mg by mouth 2 (two) times daily.     aspirin  EC 81 MG tablet Take 1 tablet (81 mg total) by mouth daily. Swallow whole.     atorvastatin  (  LIPITOR) 10 MG tablet TAKE ONE TABLET BY MOUTH EVERY DAY 90 tablet 3   baclofen  (LIORESAL ) 10 MG tablet TAKE 1 TABLET BY MOUTH 3 TIMES DAILY AS NEEDED FOR MUSCLE SPASM 90 each 2   Biotin 2500 MCG CAPS Take 2,500 mcg by mouth daily.     Calcium  Carb-Cholecalciferol (CALCIUM  600 + D PO) Take 1 tablet by mouth daily.     Clotrimazole  1 % OINT Apply 1 application topically 2 (two) times daily. 56.7 g 1   diclofenac  Sodium (VOLTAREN ) 1 % GEL Apply 4 g topically 4 (four) times daily. 100 g 1   DULoxetine  (CYMBALTA ) 60 MG capsule Take 1 capsule (60 mg  total) by mouth daily. 30 capsule 3   famotidine  (PEPCID ) 20 MG tablet Take 1 tablet (20 mg total) by mouth 2 (two) times daily. 120 tablet 3   Fluticasone  Furoate (FLONASE  SENSIMIST NA) Place 1 spray into both nostrils daily as needed (allergies).     gabapentin  (NEURONTIN ) 100 MG capsule TAKE 2 CAPSULES BY MOUTH EVERY MORNING, AND 2 CAPSULES IN MIDDAY. 120 capsule 5   gabapentin  (NEURONTIN ) 300 MG capsule Take 1 capsule (300 mg total) by mouth every evening. 90 capsule 0   Homeopathic Products (ARNICA EX) Apply 1 Application topically as needed (apply to broken skin as needed.).     hydrochlorothiazide  (MICROZIDE ) 12.5 MG capsule Take 1 capsule (12.5 mg total) by mouth daily as needed. 90 capsule 3   Lactobacillus (PROBIOTIC ACIDOPHILUS PO) Take 1 tablet by mouth daily.     lidocaine  (LIDODERM ) 5 % Place 1 patch onto the skin as needed (1 every 24 hours). Remove & Discard patch within 12 hours or as directed by MD     Multiple Vitamin (MULTIVITAMIN) tablet Take 1 tablet by mouth daily.     Multiple Vitamins-Minerals (ICAPS AREDS FORMULA PO) Take 1 tablet by mouth 2 (two) times daily.     mupirocin  ointment (BACTROBAN ) 2 % Apply 1 application topically 2 (two) times daily. 22 g 1   nitrofurantoin , macrocrystal-monohydrate, (MACROBID ) 100 MG capsule Take 1 capsule (100 mg total) by mouth 2 (two) times daily. Take with food. 10 capsule 0   pantoprazole  (PROTONIX ) 40 MG tablet TAKE ONE TABLET (40 MG) BY MOUTH EVERY DAY 1 HOUR BEFORE BREAKFAST 90 tablet 3   traZODone  (DESYREL ) 50 MG tablet TAKE TWO TABLETS BY MOUTH AT BEDTIME 180 tablet 3   triamcinolone  (KENALOG ) 0.025 % cream Apply 1 application topically 2 (two) times daily. 15 g 1   valACYclovir  (VALTREX ) 1000 MG tablet TAKE TWO TABLETS BY MOUTH EVERY 12 HOURSFOR 1 DAY. **START AS SOON AS POSSIBLE AFTER SYMPTOM ONSET** 30 tablet 1   No current facility-administered medications on file prior to visit.    Review of Systems  Constitutional:   Negative for chills and fever.  Respiratory:  Negative for cough.   Cardiovascular:  Negative for chest pain and palpitations.  Gastrointestinal:  Negative for nausea and vomiting.  Musculoskeletal:  Negative for back pain (controlled).      Objective:    BP 124/68   Pulse 87   Temp 97.6 F (36.4 C) (Oral)   Ht 4' 11 (1.499 m)   Wt 139 lb 3.2 oz (63.1 kg)   SpO2 96%   BMI 28.11 kg/m  BP Readings from Last 3 Encounters:  07/04/24 124/68  05/31/24 (!) 125/50  05/01/24 122/72   Wt Readings from Last 3 Encounters:  07/04/24 139 lb 3.2 oz (63.1 kg)  05/31/24 142 lb  3.2 oz (64.5 kg)  05/01/24 139 lb 12.8 oz (63.4 kg)    Physical Exam

## 2024-07-04 NOTE — Assessment & Plan Note (Signed)
 Chronic, improved. No longer on luvox . Continue cymbalta  60mg  qd

## 2024-07-04 NOTE — Assessment & Plan Note (Signed)
 We had a long discussion in regards to what DO NOT RESUSCITATE order would mean.  Patient does not want  the use of artificial or heroic means to be kept alive. Patient would like me to write an order in the chart that if patient was to die, no attempt to resuscitate will be made.  Patient doesn't want intubation, CPR, ACLS or defibrillation. She would like comfort care and has completed this in her Living will. Patient's  daughter    is her health care power of attorney.  All questions answered and patient verbalized understanding of all. I have signed MOST form and DO NOT RESUSCITATE order and provided patient with original copy.  Forms scanned to chart today. Counseled patient on the importance of keeping DNR paperwork at home and visible so that people around patient are aware

## 2024-07-09 DIAGNOSIS — Z8781 Personal history of (healed) traumatic fracture: Secondary | ICD-10-CM | POA: Diagnosis not present

## 2024-07-09 DIAGNOSIS — Z1331 Encounter for screening for depression: Secondary | ICD-10-CM | POA: Diagnosis not present

## 2024-07-09 DIAGNOSIS — M810 Age-related osteoporosis without current pathological fracture: Secondary | ICD-10-CM | POA: Diagnosis not present

## 2024-07-17 DIAGNOSIS — M545 Low back pain, unspecified: Secondary | ICD-10-CM | POA: Diagnosis not present

## 2024-07-17 DIAGNOSIS — R2689 Other abnormalities of gait and mobility: Secondary | ICD-10-CM | POA: Diagnosis not present

## 2024-07-17 DIAGNOSIS — M47816 Spondylosis without myelopathy or radiculopathy, lumbar region: Secondary | ICD-10-CM | POA: Diagnosis not present

## 2024-07-17 DIAGNOSIS — R2681 Unsteadiness on feet: Secondary | ICD-10-CM | POA: Diagnosis not present

## 2024-07-17 DIAGNOSIS — M6281 Muscle weakness (generalized): Secondary | ICD-10-CM | POA: Diagnosis not present

## 2024-07-18 ENCOUNTER — Other Ambulatory Visit: Payer: Self-pay | Admitting: Family

## 2024-07-18 DIAGNOSIS — M2550 Pain in unspecified joint: Secondary | ICD-10-CM

## 2024-07-18 DIAGNOSIS — I10 Essential (primary) hypertension: Secondary | ICD-10-CM

## 2024-07-19 DIAGNOSIS — M545 Low back pain, unspecified: Secondary | ICD-10-CM | POA: Diagnosis not present

## 2024-07-19 DIAGNOSIS — M6281 Muscle weakness (generalized): Secondary | ICD-10-CM | POA: Diagnosis not present

## 2024-07-19 DIAGNOSIS — R2681 Unsteadiness on feet: Secondary | ICD-10-CM | POA: Diagnosis not present

## 2024-07-19 DIAGNOSIS — R2689 Other abnormalities of gait and mobility: Secondary | ICD-10-CM | POA: Diagnosis not present

## 2024-07-19 DIAGNOSIS — M47816 Spondylosis without myelopathy or radiculopathy, lumbar region: Secondary | ICD-10-CM | POA: Diagnosis not present

## 2024-07-22 DIAGNOSIS — M6281 Muscle weakness (generalized): Secondary | ICD-10-CM | POA: Diagnosis not present

## 2024-07-22 DIAGNOSIS — M47816 Spondylosis without myelopathy or radiculopathy, lumbar region: Secondary | ICD-10-CM | POA: Diagnosis not present

## 2024-07-22 DIAGNOSIS — R2689 Other abnormalities of gait and mobility: Secondary | ICD-10-CM | POA: Diagnosis not present

## 2024-07-22 DIAGNOSIS — R2681 Unsteadiness on feet: Secondary | ICD-10-CM | POA: Diagnosis not present

## 2024-07-23 ENCOUNTER — Ambulatory Visit: Admitting: Family

## 2024-07-23 ENCOUNTER — Encounter: Payer: Self-pay | Admitting: Family

## 2024-07-23 VITALS — BP 120/70 | HR 80 | Temp 98.3°F | Ht 59.0 in | Wt 143.0 lb

## 2024-07-23 DIAGNOSIS — R197 Diarrhea, unspecified: Secondary | ICD-10-CM | POA: Diagnosis not present

## 2024-07-23 LAB — CBC WITH DIFFERENTIAL/PLATELET
Basophils Absolute: 0 K/uL (ref 0.0–0.1)
Basophils Relative: 0.6 % (ref 0.0–3.0)
Eosinophils Absolute: 0.1 K/uL (ref 0.0–0.7)
Eosinophils Relative: 1.7 % (ref 0.0–5.0)
HCT: 33.8 % — ABNORMAL LOW (ref 36.0–46.0)
Hemoglobin: 11.5 g/dL — ABNORMAL LOW (ref 12.0–15.0)
Lymphocytes Relative: 20.8 % (ref 12.0–46.0)
Lymphs Abs: 1.2 K/uL (ref 0.7–4.0)
MCHC: 34 g/dL (ref 30.0–36.0)
MCV: 99.5 fl (ref 78.0–100.0)
Monocytes Absolute: 0.8 K/uL (ref 0.1–1.0)
Monocytes Relative: 14.1 % — ABNORMAL HIGH (ref 3.0–12.0)
Neutro Abs: 3.7 K/uL (ref 1.4–7.7)
Neutrophils Relative %: 62.8 % (ref 43.0–77.0)
Platelets: 245 K/uL (ref 150.0–400.0)
RBC: 3.39 Mil/uL — ABNORMAL LOW (ref 3.87–5.11)
RDW: 12.9 % (ref 11.5–15.5)
WBC: 5.9 K/uL (ref 4.0–10.5)

## 2024-07-23 LAB — COMPREHENSIVE METABOLIC PANEL WITH GFR
ALT: 17 U/L (ref 0–35)
AST: 23 U/L (ref 0–37)
Albumin: 4 g/dL (ref 3.5–5.2)
Alkaline Phosphatase: 66 U/L (ref 39–117)
BUN: 20 mg/dL (ref 6–23)
CO2: 29 meq/L (ref 19–32)
Calcium: 9.6 mg/dL (ref 8.4–10.5)
Chloride: 105 meq/L (ref 96–112)
Creatinine, Ser: 0.69 mg/dL (ref 0.40–1.20)
GFR: 80.97 mL/min (ref >60.00)
Glucose, Bld: 134 mg/dL — ABNORMAL HIGH (ref 70–99)
Potassium: 3.3 meq/L — ABNORMAL LOW (ref 3.5–5.1)
Sodium: 142 meq/L (ref 135–145)
Total Bilirubin: 0.5 mg/dL (ref 0.2–1.2)
Total Protein: 6.5 g/dL (ref 6.0–8.3)

## 2024-07-23 MED ORDER — DIPHENOXYLATE-ATROPINE 2.5-0.025 MG PO TABS: 1.0000 | ORAL_TABLET | Freq: Two times a day (BID) | ORAL | 0 refills | Status: DC | PRN

## 2024-07-23 NOTE — Progress Notes (Unsigned)
 Acute Office Visit  Subjective:     Patient ID: Claudia Andersen, female    DOB: 20-Sep-1941, 83 y.o.   MRN: [id number]  Chief Complaint  Patient presents with  . Diarrhea    HPI Patient is in today with c/o diarrhea x 5 days that is worse at night. She normally has rectal leakage at night and wears a depend. She has been having to wear two more recently. She denies any dietary changes, no med changes. She does not feel ill. No abdominal pain. No foul odor. Feels like her normal self.   Review of Systems  Gastrointestinal:  Positive for diarrhea.  All other systems reviewed and are negative.   Past Medical History:  Diagnosis Date  . Ankle fracture, bimalleolar, closed, right, initial encounter 10/26/2018   d/t fall 10/26/18  . Arthritis    generalized  . Back abrasion    CRUSHED VERTEBRAE  . Cataract    bilateral sx   . Closed fracture of shaft of fibula 05/11/2020  . Closed right radial fracture 10/26/2018   s/p a fall  . Compression of lumbar vertebra (HCC)    L1 and L4 (from fall November 05, 2018)  . Degenerative disc disease, lumbar   . Depression    hx of-on meds  . Gallstones   . GERD (gastroesophageal reflux disease)    on meds  . Migraines   . Osteoporosis    on Prolia  inj  . Pulmonary emboli (HCC)    history of emboli    Social History   Socioeconomic History  . Marital status: Divorced    Spouse name: Not on file  . Number of children: 1  . Years of education: Not on file  . Highest education level: Not on file  Occupational History  . Occupation: retired  Tobacco Use  . Smoking status: Former    Current packs/day: 0.00    Average packs/day: 0.5 packs/day for 22.0 years (11.0 ttl pk-yrs)    Types: Cigarettes    Start date: 22    Quit date: 66    Years since quitting: 43.8  . Smokeless tobacco: Never  Vaping Use  . Vaping status: Never Used  Substance and Sexual Activity  . Alcohol use: Yes    Alcohol/week: 10.0 standard drinks of  alcohol    Types: 10 Standard drinks or equivalent per week  . Drug use: Never  . Sexual activity: Not on file  Other Topics Concern  . Not on file  Social History Narrative   Retired Museum/gallery conservator      From [place].    Lives at [facility]   Social Drivers of Health   Financial Resource Strain: Low Risk  (10/26/2021)   Overall Financial Resource Strain (CARDIA)   . Difficulty of Paying Living Expenses: Not hard at all  Food Insecurity: No Food Insecurity (10/26/2021)   Hunger Vital Sign   . Worried About Programme researcher, broadcasting/film/video in the Last Year: Never true   . Ran Out of Food in the Last Year: Never true  Transportation Needs: No Transportation Needs (10/26/2021)   PRAPARE - Transportation   . Lack of Transportation (Medical): No   . Lack of Transportation (Non-Medical): No  Physical Activity: Sufficiently Active (10/26/2021)   Exercise Vital Sign   . Days of Exercise per Week: 7 days   . Minutes of Exercise per Session: 60 min  Stress: No Stress Concern Present (10/26/2021)   [employer] of Occupational Health -  Occupational Stress Questionnaire   . Feeling of Stress : Not at all  Social Connections: Not on file  Intimate Partner Violence: Not At Risk (10/26/2021)   Humiliation, Afraid, Rape, and Kick questionnaire   . Fear of Current or Ex-Partner: No   . Emotionally Abused: No   . Physically Abused: No   . Sexually Abused: No    Past Surgical History:  Procedure Laterality Date  . ABDOMINAL HYSTERECTOMY    . APPENDECTOMY    . CATARACT EXTRACTION W/PHACO Right 03/27/2018   Procedure: CATARACT EXTRACTION PHACO AND INTRAOCULAR LENS PLACEMENT (IOC);  Surgeon: Jaye Fallow, MD;  Location: [facility];  Service: Ophthalmology;  Laterality: Right;  US  00:33 AP% 15.3 CDE 5.18 Fluid pack lot # [id number]  . CATARACT EXTRACTION W/PHACO Left 06/26/2018   Procedure: CATARACT EXTRACTION PHACO AND INTRAOCULAR LENS PLACEMENT (IOC);  Surgeon: Jaye Fallow, MD;  Location: [facility];   Service: Ophthalmology;  Laterality: Left;  US  00:38 AP% 15.2 CDE 5.80 Fluid pack lot # [id number]  . EYE SURGERY  Cataract surgery on both eyes  . FRACTURE SURGERY Right    RIGHT RADIAL wrist  . JOINT REPLACEMENT    . METATARSAL HEAD EXCISION Right 04/01/2021   Procedure: METATARSAL HEAD EXCISION- Tailors Exostectomy -Right;  Surgeon: Lennie Barter, DPM;  Location: [facility];  Service: Podiatry;  Laterality: Right;  CANNOT arrive before 9:30 Anesthesia- Mac + local  . TONSILLECTOMY AND ADENOIDECTOMY    . 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   Procedure: TOTAL HIP ARTHROPLASTY ANTERIOR APPROACH;  Surgeon: Leora Lynwood SAUNDERS, MD;  Location: [facility];  Service: Orthopedics;  Laterality: Right;  . TOTAL KNEE ARTHROPLASTY Left   . TRIPLE SUBTALAR FUSION    . WISDOM TOOTH EXTRACTION      Family History  Problem Relation Age of Onset  . Arthritis Mother   . Stroke Mother 38  . Hemachromatosis Mother   . Heart disease Father 85  . Pneumonia Maternal Grandmother   . Heart disease Maternal Grandfather   . Heart disease Paternal Grandmother   . Heart disease Paternal Grandfather   . Breast cancer Neg Hx   . Colon cancer Neg Hx   . Colon polyps Neg Hx   . Esophageal cancer Neg Hx   . Rectal cancer Neg Hx   . Stomach cancer Neg Hx     Allergies  Allergen Reactions  . Nsaids Other (See Comments)    Reflux symptoms    Current Outpatient Medications on File Prior to Visit  Medication Sig Dispense Refill  . acetaminophen  (TYLENOL ) 650 MG CR tablet Take 1,300 mg by mouth 2 (two) times daily.    . aspirin  EC 81 MG tablet Take 1 tablet (81 mg total) by mouth daily. Swallow whole.    . atorvastatin  (LIPITOR) 10 MG tablet TAKE ONE TABLET BY MOUTH EVERY DAY 90 tablet 3  . baclofen  (LIORESAL ) 10 MG tablet TAKE 1 TABLET BY MOUTH 3 TIMES DAILY AS NEEDED FOR MUSCLE SPASM 90 each 2  . Biotin 2500 MCG CAPS Take 2,500 mcg by mouth daily.    . Calcium  Carb-Cholecalciferol (CALCIUM  600 + D  PO) Take 1 tablet by mouth daily.    . Clotrimazole  1 % OINT Apply 1 application topically 2 (two) times daily. 56.7 g 1  . diclofenac  Sodium (VOLTAREN ) 1 % GEL Apply 4 g topically 4 (four) times daily. 100 g 1  . DULoxetine  (CYMBALTA ) 60 MG capsule Take 1 capsule (60 mg total) by mouth daily. 30  capsule 3  . famotidine  (PEPCID ) 20 MG tablet Take 1 tablet (20 mg total) by mouth 2 (two) times daily. 120 tablet 3  . Fluticasone  Furoate (FLONASE  SENSIMIST NA) Place 1 spray into both nostrils daily as needed (allergies).    . gabapentin  (NEURONTIN ) 100 MG capsule TAKE 2 CAPSULES BY MOUTH EVERY MORNING, AND 2 CAPSULES IN MIDDAY. 120 capsule 5  . gabapentin  (NEURONTIN ) 300 MG capsule TAKE 1 CAPSULE BY MOUTH EVERY EVENING ASDIRECTED 90 capsule 3  . Homeopathic Products (ARNICA EX) Apply 1 Application topically as needed (apply to broken skin as needed.).    . hydrochlorothiazide  (MICROZIDE ) 12.5 MG capsule TAKE 1 CAPSULE BY MOUTH DAILY AS NEEDED. 90 capsule 3  . Lactobacillus (PROBIOTIC ACIDOPHILUS PO) Take 1 tablet by mouth daily.    . lidocaine  (LIDODERM ) 5 % Place 1 patch onto the skin as needed (1 every 24 hours). Remove & Discard patch within 12 hours or as directed by MD    . Multiple Vitamin (MULTIVITAMIN) tablet Take 1 tablet by mouth daily.    . Multiple Vitamins-Minerals (ICAPS AREDS FORMULA PO) Take 1 tablet by mouth 2 (two) times daily.    . mupirocin  ointment (BACTROBAN ) 2 % Apply 1 application topically 2 (two) times daily. 22 g 1  . nitrofurantoin , macrocrystal-monohydrate, (MACROBID ) 100 MG capsule Take 1 capsule (100 mg total) by mouth 2 (two) times daily. Take with food. 10 capsule 0  . pantoprazole  (PROTONIX ) 40 MG tablet TAKE ONE TABLET (40 MG) BY MOUTH EVERY DAY 1 HOUR BEFORE BREAKFAST 90 tablet 3  . traZODone  (DESYREL ) 50 MG tablet TAKE TWO TABLETS BY MOUTH AT BEDTIME 180 tablet 3  . triamcinolone  (KENALOG ) 0.025 % cream Apply 1 application topically 2 (two) times daily. 15 g 1  .  valACYclovir  (VALTREX ) 1000 MG tablet TAKE TWO TABLETS BY MOUTH EVERY 12 HOURSFOR 1 DAY. **START AS SOON AS POSSIBLE AFTER SYMPTOM ONSET** 30 tablet 1   No current facility-administered medications on file prior to visit.    BP 120/70   Pulse 80   Temp 98.3 F (36.8 C) (Oral)   Ht 4' 11 (1.499 m)   Wt 143 lb (64.9 kg)   SpO2 97%   BMI 28.88 kg/m chart     Objective:    BP 120/70   Pulse 80   Temp 98.3 F (36.8 C) (Oral)   Ht 4' 11 (1.499 m)   Wt 143 lb (64.9 kg)   SpO2 97%   BMI 28.88 kg/m    Physical Exam Vitals and nursing note reviewed.  Constitutional:      Appearance: Normal appearance. She is normal weight.  Cardiovascular:     Rate and Rhythm: Normal rate and regular rhythm.     Pulses: Normal pulses.     Heart sounds: Normal heart sounds.  Pulmonary:     Effort: Pulmonary effort is normal.     Breath sounds: Normal breath sounds.  Abdominal:     General: Abdomen is flat. Bowel sounds are normal. There is no distension.     Palpations: Abdomen is soft.     Tenderness: There is no abdominal tenderness. There is no guarding.  Musculoskeletal:        General: Normal range of motion.  Skin:    General: Skin is warm and dry.  Neurological:     General: No focal deficit present.     Mental Status: She is alert and oriented to person, place, and time. Mental status is at baseline.  Psychiatric:        Mood and Affect: Mood normal.        Behavior: Behavior normal.        Thought Content: Thought content normal.    No results found for any visits on 07/23/24.      Assessment & Plan:   Problem List Items Addressed This Visit     Diarrhea - Primary   Relevant Orders   Comp Met (CMET)   CBC w/Diff    Meds ordered this encounter  Medications  . diphenoxylate-atropine (LOMOTIL) 2.5-0.025 MG tablet    Sig: Take 1 tablet by mouth 2 (two) times daily as needed for diarrhea or loose stools.    Dispense:  30 tablet    Refill:  0   Call the office if  symptoms worsen or persist. Recheck as scheduled and sooner as needed.  No follow-ups on file.  Akshay Spang B Larz Mark, FNP

## 2024-07-24 ENCOUNTER — Ambulatory Visit: Payer: Self-pay | Admitting: Family

## 2024-07-24 ENCOUNTER — Encounter: Payer: Self-pay | Admitting: Family

## 2024-07-24 ENCOUNTER — Other Ambulatory Visit: Payer: Self-pay | Admitting: Family

## 2024-07-24 MED ORDER — POTASSIUM CHLORIDE CRYS ER 20 MEQ PO TBCR
20.0000 meq | EXTENDED_RELEASE_TABLET | Freq: Every day | ORAL | 0 refills | Status: AC
Start: 2024-07-24 — End: ?

## 2024-07-26 NOTE — Telephone Encounter (Signed)
 Noted

## 2024-07-29 ENCOUNTER — Other Ambulatory Visit: Payer: Self-pay | Admitting: Family

## 2024-07-29 DIAGNOSIS — R899 Unspecified abnormal finding in specimens from other organs, systems and tissues: Secondary | ICD-10-CM

## 2024-08-01 ENCOUNTER — Other Ambulatory Visit: Payer: Self-pay | Admitting: Family

## 2024-08-01 DIAGNOSIS — R197 Diarrhea, unspecified: Secondary | ICD-10-CM

## 2024-08-02 DIAGNOSIS — R2681 Unsteadiness on feet: Secondary | ICD-10-CM | POA: Diagnosis not present

## 2024-08-02 DIAGNOSIS — M6281 Muscle weakness (generalized): Secondary | ICD-10-CM | POA: Diagnosis not present

## 2024-08-02 DIAGNOSIS — M47816 Spondylosis without myelopathy or radiculopathy, lumbar region: Secondary | ICD-10-CM | POA: Diagnosis not present

## 2024-08-02 DIAGNOSIS — R2689 Other abnormalities of gait and mobility: Secondary | ICD-10-CM | POA: Diagnosis not present

## 2024-08-02 DIAGNOSIS — M545 Low back pain, unspecified: Secondary | ICD-10-CM | POA: Diagnosis not present

## 2024-08-05 DIAGNOSIS — M6281 Muscle weakness (generalized): Secondary | ICD-10-CM | POA: Diagnosis not present

## 2024-08-05 DIAGNOSIS — M810 Age-related osteoporosis without current pathological fracture: Secondary | ICD-10-CM | POA: Diagnosis not present

## 2024-08-05 DIAGNOSIS — R2689 Other abnormalities of gait and mobility: Secondary | ICD-10-CM | POA: Diagnosis not present

## 2024-08-05 DIAGNOSIS — M47816 Spondylosis without myelopathy or radiculopathy, lumbar region: Secondary | ICD-10-CM | POA: Diagnosis not present

## 2024-08-05 DIAGNOSIS — M545 Low back pain, unspecified: Secondary | ICD-10-CM | POA: Diagnosis not present

## 2024-08-05 DIAGNOSIS — R2681 Unsteadiness on feet: Secondary | ICD-10-CM | POA: Diagnosis not present

## 2024-08-06 ENCOUNTER — Ambulatory Visit: Admitting: Podiatry

## 2024-08-07 ENCOUNTER — Encounter: Payer: Self-pay | Admitting: Internal Medicine

## 2024-08-07 ENCOUNTER — Ambulatory Visit: Admitting: Internal Medicine

## 2024-08-07 VITALS — BP 114/68 | HR 91 | Temp 98.0°F | Ht 59.0 in | Wt 138.8 lb

## 2024-08-07 DIAGNOSIS — I10 Essential (primary) hypertension: Secondary | ICD-10-CM

## 2024-08-07 DIAGNOSIS — R2689 Other abnormalities of gait and mobility: Secondary | ICD-10-CM | POA: Diagnosis not present

## 2024-08-07 DIAGNOSIS — M545 Low back pain, unspecified: Secondary | ICD-10-CM | POA: Diagnosis not present

## 2024-08-07 DIAGNOSIS — S81809A Unspecified open wound, unspecified lower leg, initial encounter: Secondary | ICD-10-CM | POA: Insufficient documentation

## 2024-08-07 DIAGNOSIS — R2681 Unsteadiness on feet: Secondary | ICD-10-CM | POA: Diagnosis not present

## 2024-08-07 DIAGNOSIS — M6281 Muscle weakness (generalized): Secondary | ICD-10-CM | POA: Diagnosis not present

## 2024-08-07 DIAGNOSIS — M47816 Spondylosis without myelopathy or radiculopathy, lumbar region: Secondary | ICD-10-CM | POA: Diagnosis not present

## 2024-08-07 MED ORDER — DOXYCYCLINE HYCLATE 100 MG PO TABS: 100.0000 mg | ORAL_TABLET | Freq: Two times a day (BID) | ORAL | 0 refills | Status: AC

## 2024-08-07 NOTE — Progress Notes (Signed)
 Acute Office Visit  Subjective:     Patient ID: Claudia Andersen, female    DOB: Apr 28, 1941, 83 y.o.   MRN: [id number]  Chief Complaint  Patient presents with   Acute Visit    Injury to right leg on 06/04/24 Open wound with clear fluid leaking  Fluid will have a little blood in it sometimes    Discussed the use of AI scribe software for clinical note transcription with the patient, who gave verbal consent to proceed.  History of Present Illness Claudia Andersen is a 83 year old female who presents with a non-healing wound on her leg.  Non-healing lower extremity wound - Sustained injury to leg on August 18th from repeated impact with carry-on luggage wheels while walking down a narrow airplane aisle - Persistent wound with continuous clear discharge and some blood since injury - Initial scab formed but subsequently fell off; no new scab has developed - Occasional aching pain at the wound site - No fevers or chills - Bandage changed once daily, though continuous leakage suggests it could be changed more frequently - Unable to use edema pump due to exacerbation of pain at wound site  Chronic lower extremity edema - Chronic swelling in both legs - Edema pump not used due to pain at wound site  Diarrhea - Persistent diarrhea - Referral to Gastroenterology placed but not yet seen due to scheduling delays    Review of Systems  Constitutional: Negative.   HENT: Negative.    Respiratory: Negative.    Cardiovascular: Negative.   Gastrointestinal: Negative.   Musculoskeletal: Negative.   Skin:        Open wound on right lower extremity for the last 2 months with clear discharge and occasional blood  Neurological: Negative.   Psychiatric/Behavioral: Negative.          Objective:    BP 114/68   Pulse 91   Temp 98 F (36.7 C)   Ht 4' 11 (1.499 m)   Wt 138 lb 12.8 oz (63 kg)   SpO2 96%   BMI 28.03 kg/m    Physical Exam Constitutional:      Appearance:  Normal appearance.  HENT:     Head: Normocephalic and atraumatic.  Cardiovascular:     Rate and Rhythm: Normal rate and regular rhythm.     Heart sounds: Normal heart sounds.  Pulmonary:     Effort: Pulmonary effort is normal.     Breath sounds: Normal breath sounds. No wheezing, rhonchi or rales.  Abdominal:     General: Bowel sounds are normal. There is no distension.     Palpations: Abdomen is soft.     Tenderness: There is no abdominal tenderness. There is no guarding or rebound.  Musculoskeletal:        General: Tenderness present. No swelling.     Right lower leg: Edema present.     Left lower leg: Edema present.     Comments: 2+ bilateral lower extremity pitting edema noted with tenderness noted over the right lower extremity wound and surrounding area  Skin:    Comments: Patient noted to have an open right lower extremity wound approximately 1.5 x 1.5 cm over the right lateral shin.  Clear discharge noted from the wound.  No granulation tissue noted at the site.  Mild to moderate tenderness to palpation with surrounding erythema  Neurological:     Mental Status: She is alert.  Psychiatric:        Mood  and Affect: Mood normal.        Behavior: Behavior normal.     No results found for any visits on 08/07/24.      Assessment & Plan:   Problem List Items Addressed This Visit       Cardiovascular and Mediastinum   HTN (hypertension)   - This problem is chronic and stable -Blood pressure today is 114/68 -Will continue with hydrochlorothiazide  12.5 mg daily as needed -No further workup at this time        Other   Non-healing wound of lower extremity, initial encounter - Primary   - Patient complains of a nonhealing wound over her right lateral lower extremity for the last 2 months -Patient states that she got the wound when attempting to walk with her carry-on down thigh limit her pain and had a carry-on bag hit the lateral aspect of her right leg -She has been  putting Band-Aids on it and initially noted scab formation but the scab fell off and patient has had persistent open wound since then -On exam, patient has an open wound on the right lateral shin approximate 1.5 x 1.5 cm with clear discharge and no granulation tissue noted at the site.  Patient does have surrounding erythema and mild increased local warmth with mild to moderate tenderness to palpation at the site.  She also has 2+ bilateral extremity pitting edema. -I am concerned about surrounding cellulitis given her slight increased local warmth, erythema and tenderness to palpation and nonhealing nature of her wound.  Will treat the patient with doxycycline  to complete a 5-day course -I put in referral to the wound center given the chronicity of the wound and lack of improvement -No further workup at this time      Relevant Medications   doxycycline  (VIBRA -TABS) 100 MG tablet   Other Relevant Orders   Ambulatory referral to Wound Clinic    Meds ordered this encounter  Medications   doxycycline  (VIBRA -TABS) 100 MG tablet    Sig: Take 1 tablet (100 mg total) by mouth 2 (two) times daily.    Dispense:  10 tablet    Refill:  0    No follow-ups on file.  Desare Duddy, MD

## 2024-08-07 NOTE — Assessment & Plan Note (Signed)
-   Patient complains of a nonhealing wound over her right lateral lower extremity for the last 2 months -Patient states that she got the wound when attempting to walk with her carry-on down thigh limit her pain and had a carry-on bag hit the lateral aspect of her right leg -She has been putting Band-Aids on it and initially noted scab formation but the scab fell off and patient has had persistent open wound since then -On exam, patient has an open wound on the right lateral shin approximate 1.5 x 1.5 cm with clear discharge and no granulation tissue noted at the site.  Patient does have surrounding erythema and mild increased local warmth with mild to moderate tenderness to palpation at the site.  She also has 2+ bilateral extremity pitting edema. -I am concerned about surrounding cellulitis given her slight increased local warmth, erythema and tenderness to palpation and nonhealing nature of her wound.  Will treat the patient with doxycycline  to complete a 5-day course -I put in referral to the wound center given the chronicity of the wound and lack of improvement -No further workup at this time

## 2024-08-07 NOTE — Assessment & Plan Note (Signed)
-   This problem is chronic and stable -Blood pressure today is 114/68 -Will continue with hydrochlorothiazide  12.5 mg daily as needed -No further workup at this time

## 2024-08-07 NOTE — Patient Instructions (Signed)
  VISIT SUMMARY: Today, we discussed the non-healing wound on your leg, chronic swelling in your legs, and persistent diarrhea. We have developed a plan to address each of these issues to help improve your health and comfort.  YOUR PLAN: -CHRONIC NON-HEALING LOWER LEG WOUND WITH LOCAL INFECTION AND CHRONIC LOWER EXTREMITY EDEMA: You have a wound on your leg that has not healed since August 18, likely due to the chronic swelling in your legs. The wound shows signs of a local infection, such as clear discharge, some blood, warmth, and redness. We will start you on antibiotics to treat the infection and refer you to a wound care specialist for further evaluation and management. Please apply a gauze dressing to the wound and change it as needed.  -CHRONIC DIARRHEA, UNDER EVALUATION: You have been experiencing persistent diarrhea, and we are still trying to determine the cause. We are waiting for your gastroenterology assessment, but there may be a delay in getting an appointment. We will contact [facility] to change your referral to a more accessible clinic with available gastroenterologists.  INSTRUCTIONS: Please follow up with the wound care specialist as soon as possible for your leg wound. We will also update you once we have changed your gastroenterology referral to a closer clinic. Continue to monitor your symptoms and let us  know if you experience any new or worsening issues.                      Contains text generated by [employer]bridge.                                 Contains text generated by [employer].

## 2024-08-08 ENCOUNTER — Other Ambulatory Visit: Payer: Self-pay | Admitting: Family

## 2024-08-08 DIAGNOSIS — R197 Diarrhea, unspecified: Secondary | ICD-10-CM

## 2024-08-12 ENCOUNTER — Other Ambulatory Visit: Payer: Self-pay | Admitting: Family

## 2024-08-12 DIAGNOSIS — M47816 Spondylosis without myelopathy or radiculopathy, lumbar region: Secondary | ICD-10-CM | POA: Diagnosis not present

## 2024-08-12 DIAGNOSIS — M6281 Muscle weakness (generalized): Secondary | ICD-10-CM | POA: Diagnosis not present

## 2024-08-12 DIAGNOSIS — R2681 Unsteadiness on feet: Secondary | ICD-10-CM | POA: Diagnosis not present

## 2024-08-12 DIAGNOSIS — M545 Low back pain, unspecified: Secondary | ICD-10-CM | POA: Diagnosis not present

## 2024-08-12 DIAGNOSIS — R2689 Other abnormalities of gait and mobility: Secondary | ICD-10-CM | POA: Diagnosis not present

## 2024-08-13 ENCOUNTER — Ambulatory Visit: Admitting: Podiatry

## 2024-08-13 ENCOUNTER — Encounter: Payer: Self-pay | Admitting: Family

## 2024-08-13 DIAGNOSIS — M2032 Hallux varus (acquired), left foot: Secondary | ICD-10-CM | POA: Diagnosis not present

## 2024-08-13 NOTE — Progress Notes (Signed)
 Subjective:  Patient ID: Claudia Andersen, female    DOB: 1941-06-14,  MRN: [id number]  Chief Complaint  Patient presents with   Toe Pain    Right hallux pain along the outside of her nail     83 y.o. female presents with the above complaint.  Patient presents with left hallux varus deformity.  She does not recall any surgery to the left side.  She just wanted to get it evaluated is causing her some discomfort and sometimes her shoes especially dress shoes.  Pain scale is 5 out of 10 dull aching nature and regular shoes does not cause her any kind of discomfort.   Review of Systems: Negative except as noted in the HPI. Denies N/V/F/Ch.  Past Medical History:  Diagnosis Date   Ankle fracture, bimalleolar, closed, right, initial encounter 10/26/2018   d/t fall 10/26/18   Arthritis    generalized   Back abrasion    CRUSHED VERTEBRAE   Cataract    bilateral sx    Closed fracture of shaft of fibula 05/11/2020   Closed right radial fracture 10/26/2018   s/p a fall   Compression of lumbar vertebra (HCC)    L1 and L4 (from fall November 05, 2018)   Degenerative disc disease, lumbar    Depression    hx of-on meds   Gallstones    GERD (gastroesophageal reflux disease)    on meds   Migraines    Osteoporosis    on Prolia  inj   Pulmonary emboli (HCC)    history of emboli    Current Outpatient Medications:    acetaminophen  (TYLENOL ) 650 MG CR tablet, Take 1,300 mg by mouth 2 (two) times daily., Disp: , Rfl:    aspirin  EC 81 MG tablet, Take 1 tablet (81 mg total) by mouth daily. Swallow whole., Disp: , Rfl:    atorvastatin  (LIPITOR) 10 MG tablet, TAKE ONE TABLET BY MOUTH EVERY DAY (Patient not taking: Reported on 08/07/2024), Disp: 90 tablet, Rfl: 3   baclofen  (LIORESAL ) 10 MG tablet, TAKE 1 TABLET BY MOUTH 3 TIMES DAILY AS NEEDED FOR MUSCLE SPASM, Disp: 90 each, Rfl: 2   Biotin 2500 MCG CAPS, Take 2,500 mcg by mouth daily., Disp: , Rfl:    Calcium  Carb-Cholecalciferol (CALCIUM  600 + D  PO), Take 1 tablet by mouth daily., Disp: , Rfl:    Clotrimazole  1 % OINT, Apply 1 application topically 2 (two) times daily., Disp: 56.7 g, Rfl: 1   diclofenac  Sodium (VOLTAREN ) 1 % GEL, Apply 4 g topically 4 (four) times daily., Disp: 100 g, Rfl: 1   diphenoxylate-atropine (LOMOTIL) 2.5-0.025 MG tablet, Take 1 tablet by mouth 2 (two) times daily as needed for diarrhea or loose stools., Disp: 30 tablet, Rfl: 0   doxycycline  (VIBRA -TABS) 100 MG tablet, Take 1 tablet (100 mg total) by mouth 2 (two) times daily., Disp: 10 tablet, Rfl: 0   DULoxetine  (CYMBALTA ) 60 MG capsule, Take 1 capsule (60 mg total) by mouth daily., Disp: 30 capsule, Rfl: 3   famotidine  (PEPCID ) 20 MG tablet, Take 1 tablet (20 mg total) by mouth 2 (two) times daily., Disp: 120 tablet, Rfl: 3   Fluticasone  Furoate (FLONASE  SENSIMIST NA), Place 1 spray into both nostrils daily as needed (allergies)., Disp: , Rfl:    gabapentin  (NEURONTIN ) 100 MG capsule, TAKE 2 CAPSULES BY MOUTH EVERY MORNING, AND 2 CAPSULES IN MIDDAY., Disp: 120 capsule, Rfl: 5   gabapentin  (NEURONTIN ) 300 MG capsule, TAKE 1 CAPSULE BY MOUTH EVERY EVENING ASDIRECTED,  Disp: 90 capsule, Rfl: 3   Homeopathic Products (ARNICA EX), Apply 1 Application topically as needed (apply to broken skin as needed.)., Disp: , Rfl:    hydrochlorothiazide  (MICROZIDE ) 12.5 MG capsule, TAKE 1 CAPSULE BY MOUTH DAILY AS NEEDED., Disp: 90 capsule, Rfl: 3   Lactobacillus (PROBIOTIC ACIDOPHILUS PO), Take 1 tablet by mouth daily., Disp: , Rfl:    lidocaine  (LIDODERM ) 5 %, Place 1 patch onto the skin as needed (1 every 24 hours). Remove & Discard patch within 12 hours or as directed by MD, Disp: , Rfl:    Multiple Vitamin (MULTIVITAMIN) tablet, Take 1 tablet by mouth daily., Disp: , Rfl:    Multiple Vitamins-Minerals (ICAPS AREDS FORMULA PO), Take 1 tablet by mouth 2 (two) times daily., Disp: , Rfl:    mupirocin  ointment (BACTROBAN ) 2 %, Apply 1 application topically 2 (two) times daily.,  Disp: 22 g, Rfl: 1   nitrofurantoin , macrocrystal-monohydrate, (MACROBID ) 100 MG capsule, Take 1 capsule (100 mg total) by mouth 2 (two) times daily. Take with food., Disp: 10 capsule, Rfl: 0   pantoprazole  (PROTONIX ) 40 MG tablet, TAKE ONE TABLET (40 MG) BY MOUTH EVERY DAY 1 HOUR BEFORE BREAKFAST, Disp: 90 tablet, Rfl: 3   potassium chloride  SA (KLOR-CON  M) 20 MEQ tablet, Take 1 tablet (20 mEq total) by mouth daily., Disp: 10 tablet, Rfl: 0   traZODone  (DESYREL ) 50 MG tablet, TAKE TWO TABLETS BY MOUTH AT BEDTIME, Disp: 180 tablet, Rfl: 3   triamcinolone  (KENALOG ) 0.025 % cream, Apply 1 application topically 2 (two) times daily., Disp: 15 g, Rfl: 1   valACYclovir  (VALTREX ) 1000 MG tablet, TAKE TWO TABLETS BY MOUTH EVERY 12 HOURSFOR 1 DAY. **START AS SOON AS POSSIBLE AFTER SYMPTOM ONSET**, Disp: 30 tablet, Rfl: 1  Social History   Tobacco Use  Smoking Status Former   Current packs/day: 0.00   Average packs/day: 0.5 packs/day for 22.0 years (11.0 ttl pk-yrs)   Types: Cigarettes   Start date: 71   Quit date: 20   Years since quitting: 43.8  Smokeless Tobacco Never    Allergies  Allergen Reactions   Nsaids Other (See Comments)    Reflux symptoms   Objective:  There were no vitals filed for this visit. There is no height or weight on file to calculate BMI. Constitutional Well developed. Well nourished.  Vascular Dorsalis pedis pulses palpable bilaterally. Posterior tibial pulses palpable bilaterally. Capillary refill normal to all digits.  No cyanosis or clubbing noted. Pedal hair growth normal.  Neurologic Normal speech. Oriented to person, place, and time. Epicritic sensation to light touch grossly present bilaterally.  Dermatologic Nails well groomed and normal in appearance. No open wounds. No skin lesions.  Orthopedic: Hallux varus deformity noted semireducible deformity.  No intra-articular first MPJ pain noted some limitation of the range of motion of the first MPJ  noted.  No previous surgical is marking noted   Radiographs: None Assessment:   1. Acquired hallux varus of left foot    Plan:  Patient was evaluated and treated and all questions answered.  Left hallux varus deformity - All questions and concerns were discussed with the patient in extensive detail I briefly discussed surgical options as well.  For now she will benefit from shoe gear modification as well as splinting.  I discussed different ways to splint she states understanding if any foot and ankle continues to be problematic she will come back and see me.   No follow-ups on file.

## 2024-08-14 DIAGNOSIS — M6281 Muscle weakness (generalized): Secondary | ICD-10-CM | POA: Diagnosis not present

## 2024-08-14 DIAGNOSIS — K219 Gastro-esophageal reflux disease without esophagitis: Secondary | ICD-10-CM | POA: Diagnosis not present

## 2024-08-14 DIAGNOSIS — M47816 Spondylosis without myelopathy or radiculopathy, lumbar region: Secondary | ICD-10-CM | POA: Diagnosis not present

## 2024-08-14 DIAGNOSIS — R2689 Other abnormalities of gait and mobility: Secondary | ICD-10-CM | POA: Diagnosis not present

## 2024-08-14 DIAGNOSIS — L97819 Non-pressure chronic ulcer of other part of right lower leg with unspecified severity: Secondary | ICD-10-CM | POA: Diagnosis not present

## 2024-08-14 DIAGNOSIS — M545 Low back pain, unspecified: Secondary | ICD-10-CM | POA: Diagnosis not present

## 2024-08-14 DIAGNOSIS — M13861 Other specified arthritis, right knee: Secondary | ICD-10-CM | POA: Diagnosis not present

## 2024-08-14 DIAGNOSIS — R600 Localized edema: Secondary | ICD-10-CM | POA: Diagnosis not present

## 2024-08-14 DIAGNOSIS — R2681 Unsteadiness on feet: Secondary | ICD-10-CM | POA: Diagnosis not present

## 2024-08-14 DIAGNOSIS — M1711 Unilateral primary osteoarthritis, right knee: Secondary | ICD-10-CM | POA: Diagnosis not present

## 2024-08-15 ENCOUNTER — Encounter: Payer: Self-pay | Admitting: Nurse Practitioner

## 2024-08-15 ENCOUNTER — Ambulatory Visit: Admitting: Nurse Practitioner

## 2024-08-15 DIAGNOSIS — R197 Diarrhea, unspecified: Secondary | ICD-10-CM

## 2024-08-15 NOTE — Assessment & Plan Note (Signed)
 Chronic diarrhea for three months with liquid stools, no blood or mucus. Negative GI panel and C. diff tests in July. Symptoms persist despite antibiotics, probiotics, and prebiotics. - Refer to [facility] for gastroenterology evaluation. - Provide stool specimen collection kit for repeat GI panel and C. diff testing. - Advise to drop off specimen at the hospital.

## 2024-08-15 NOTE — Progress Notes (Signed)
 Established Patient Office Visit  Subjective:  Patient ID: Claudia Andersen, female    DOB: Apr 27, 1941  Age: 83 y.o. MRN: [id number]  CC:  Chief Complaint  Patient presents with   Acute Visit    Diarrhea x 4 months Denies any other symptoms   Discussed the use of AI scribe software for clinical note transcription with the patient, who gave verbal consent to proceed.  History of Present Illness Claudia Andersen is an 83 year old female who presents with chronic diarrhea for at least three months.  She experiences yellow liquid stools two to three times daily, occasionally with partially formed stools. There is no blood or mucus in the stool. The diarrhea intially began at night and persists without relation to food intake. She has no abdominal pain but notes stomach grumbling before defecation.  There is no incontinence, and her diet remains unchanged, consisting of scrambled eggs, sausage or ham, grape nuts, shrimp and pasta, chicken, and vegetables. She drinks diet tonic water or diet ginger ale. Previous GI panel, C. diff, and calprotectin tests were negative. She recently completed the course of doxycyline but states it did not made any change. She is taking probiotics, and prebiotic, not alleviated her symptoms.    She is on waiting list to see [facility]. She would like to a referral to [facility] and have fax number with her [phone]   Past Medical History:  Diagnosis Date   Ankle fracture, bimalleolar, closed, right, initial encounter 10/26/2018   d/t fall 10/26/18   Arthritis    generalized   Back abrasion    CRUSHED VERTEBRAE   Cataract    bilateral sx    Closed fracture of shaft of fibula 05/11/2020   Closed right radial fracture 10/26/2018   s/p a fall   Compression of lumbar vertebra (HCC)    L1 and L4 (from fall November 05, 2018)   Degenerative disc disease, lumbar    Depression    hx of-on meds   Gallstones    GERD (gastroesophageal reflux disease)     on meds   Migraines    Osteoporosis    on Prolia  inj   Pulmonary emboli (HCC)    history of emboli    Past Surgical History:  Procedure Laterality Date   ABDOMINAL HYSTERECTOMY     APPENDECTOMY     CATARACT EXTRACTION W/PHACO Right 03/27/2018   Procedure: CATARACT EXTRACTION PHACO AND INTRAOCULAR LENS PLACEMENT (IOC);  Surgeon: Jaye Fallow, MD;  Location: [facility];  Service: Ophthalmology;  Laterality: Right;  US  00:33 AP% 15.3 CDE 5.18 Fluid pack lot # [id number]   CATARACT EXTRACTION W/PHACO Left 06/26/2018   Procedure: CATARACT EXTRACTION PHACO AND INTRAOCULAR LENS PLACEMENT (IOC);  Surgeon: Jaye Fallow, MD;  Location: [facility];  Service: Ophthalmology;  Laterality: Left;  US  00:38 AP% 15.2 CDE 5.80 Fluid pack lot # [id number]   EYE SURGERY  Cataract surgery on both eyes   FRACTURE SURGERY Right    RIGHT RADIAL wrist   JOINT REPLACEMENT     METATARSAL HEAD EXCISION Right 04/01/2021   Procedure: METATARSAL HEAD EXCISION- Tailors Exostectomy -Right;  Surgeon: Lennie Barter, DPM;  Location: [facility];  Service: Podiatry;  Laterality: Right;  CANNOT arrive before 9:30 Anesthesia- Mac + local   TONSILLECTOMY AND ADENOIDECTOMY     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   Procedure: TOTAL HIP ARTHROPLASTY ANTERIOR APPROACH;  Surgeon: Leora Lynwood SAUNDERS, MD;  Location: [facility];  Service: Orthopedics;  Laterality: Right;   TOTAL KNEE ARTHROPLASTY Left    TRIPLE SUBTALAR FUSION     WISDOM TOOTH EXTRACTION      Family History  Problem Relation Age of Onset   Arthritis Mother    Stroke Mother 30   Hemachromatosis Mother    Heart disease Father 21   Pneumonia Maternal Grandmother    Heart disease Maternal Grandfather    Heart disease Paternal Grandmother    Heart disease Paternal Grandfather    Breast cancer Neg Hx    Colon cancer Neg Hx    Colon polyps Neg Hx    Esophageal cancer Neg Hx    Rectal cancer Neg Hx    Stomach cancer Neg Hx     Social History    Socioeconomic History   Marital status: Divorced    Spouse name: Not on file   Number of children: 1   Years of education: Not on file   Highest education level: Not on file  Occupational History   Occupation: retired  Tobacco Use   Smoking status: Former    Current packs/day: 0.00    Average packs/day: 0.5 packs/day for 22.0 years (11.0 ttl pk-yrs)    Types: Cigarettes    Start date: 38    Quit date: 1982    Years since quitting: 43.8   Smokeless tobacco: Never  Vaping Use   Vaping status: Never Used  Substance and Sexual Activity   Alcohol use: Yes    Alcohol/week: 10.0 standard drinks of alcohol    Types: 10 Standard drinks or equivalent per week   Drug use: Never   Sexual activity: Not on file  Other Topics Concern   Not on file  Social History Narrative   Retired Museum/gallery Conservator      From [place].    Lives at [facility]   Social Drivers of Health   Financial Resource Strain: Low Risk  (10/26/2021)   Overall Financial Resource Strain (CARDIA)    Difficulty of Paying Living Expenses: Not hard at all  Food Insecurity: No Food Insecurity (10/26/2021)   Hunger Vital Sign    Worried About Running Out of Food in the Last Year: Never true    Ran Out of Food in the Last Year: Never true  Transportation Needs: No Transportation Needs (10/26/2021)   PRAPARE - Administrator, Civil Service (Medical): No    Lack of Transportation (Non-Medical): No  Physical Activity: Sufficiently Active (10/26/2021)   Exercise Vital Sign    Days of Exercise per Week: 7 days    Minutes of Exercise per Session: 60 min  Stress: No Stress Concern Present (10/26/2021)   [employer] of Occupational Health - Occupational Stress Questionnaire    Feeling of Stress : Not at all  Social Connections: Not on file  Intimate Partner Violence: Not At Risk (10/26/2021)   Humiliation, Afraid, Rape, and Kick questionnaire    Fear of Current or Ex-Partner: No    Emotionally Abused: No     Physically Abused: No    Sexually Abused: No     Outpatient Medications Prior to Visit  Medication Sig Dispense Refill   acetaminophen  (TYLENOL ) 650 MG CR tablet Take 1,300 mg by mouth 2 (two) times daily.     aspirin  EC 81 MG tablet Take 1 tablet (81 mg total) by mouth daily. Swallow whole.     atorvastatin  (LIPITOR) 10 MG tablet TAKE ONE TABLET BY MOUTH EVERY DAY (Patient not taking: Reported on 08/15/2024) 90  tablet 3   baclofen  (LIORESAL ) 10 MG tablet TAKE 1 TABLET BY MOUTH 3 TIMES DAILY AS NEEDED FOR MUSCLE SPASM 90 each 2   Biotin 2500 MCG CAPS Take 2,500 mcg by mouth daily.     Calcium  Carb-Cholecalciferol (CALCIUM  600 + D PO) Take 1 tablet by mouth daily.     Clotrimazole  1 % OINT Apply 1 application topically 2 (two) times daily. 56.7 g 1   diclofenac  Sodium (VOLTAREN ) 1 % GEL Apply 4 g topically 4 (four) times daily. 100 g 1   diphenoxylate-atropine (LOMOTIL) 2.5-0.025 MG tablet Take 1 tablet by mouth 2 (two) times daily as needed for diarrhea or loose stools. 30 tablet 0   doxycycline  (VIBRA -TABS) 100 MG tablet Take 1 tablet (100 mg total) by mouth 2 (two) times daily. 10 tablet 0   DULoxetine  (CYMBALTA ) 60 MG capsule Take 1 capsule (60 mg total) by mouth daily. 30 capsule 3   famotidine  (PEPCID ) 20 MG tablet Take 1 tablet (20 mg total) by mouth 2 (two) times daily. 120 tablet 3   Fluticasone  Furoate (FLONASE  SENSIMIST NA) Place 1 spray into both nostrils daily as needed (allergies).     gabapentin  (NEURONTIN ) 100 MG capsule TAKE 2 CAPSULES BY MOUTH EVERY MORNING, AND 2 CAPSULES IN MIDDAY. 120 capsule 5   gabapentin  (NEURONTIN ) 300 MG capsule TAKE 1 CAPSULE BY MOUTH EVERY EVENING ASDIRECTED 90 capsule 3   Homeopathic Products (ARNICA EX) Apply 1 Application topically as needed (apply to broken skin as needed.).     hydrochlorothiazide  (MICROZIDE ) 12.5 MG capsule TAKE 1 CAPSULE BY MOUTH DAILY AS NEEDED. 90 capsule 3   Lactobacillus (PROBIOTIC ACIDOPHILUS PO) Take 1 tablet by mouth  daily.     lidocaine  (LIDODERM ) 5 % Place 1 patch onto the skin as needed (1 every 24 hours). Remove & Discard patch within 12 hours or as directed by MD     Multiple Vitamin (MULTIVITAMIN) tablet Take 1 tablet by mouth daily.     Multiple Vitamins-Minerals (ICAPS AREDS FORMULA PO) Take 1 tablet by mouth 2 (two) times daily.     mupirocin  ointment (BACTROBAN ) 2 % Apply 1 application topically 2 (two) times daily. 22 g 1   nitrofurantoin , macrocrystal-monohydrate, (MACROBID ) 100 MG capsule Take 1 capsule (100 mg total) by mouth 2 (two) times daily. Take with food. 10 capsule 0   pantoprazole  (PROTONIX ) 40 MG tablet TAKE ONE TABLET (40 MG) BY MOUTH EVERY DAY 1 HOUR BEFORE BREAKFAST 90 tablet 3   potassium chloride  SA (KLOR-CON  M) 20 MEQ tablet Take 1 tablet (20 mEq total) by mouth daily. 10 tablet 0   traZODone  (DESYREL ) 50 MG tablet TAKE TWO TABLETS BY MOUTH AT BEDTIME 180 tablet 3   triamcinolone  (KENALOG ) 0.025 % cream Apply 1 application topically 2 (two) times daily. 15 g 1   valACYclovir  (VALTREX ) 1000 MG tablet TAKE TWO TABLETS BY MOUTH EVERY 12 HOURSFOR 1 DAY. **START AS SOON AS POSSIBLE AFTER SYMPTOM ONSET** 30 tablet 1   No facility-administered medications prior to visit.    Allergies  Allergen Reactions   Nsaids Other (See Comments)    Reflux symptoms    ROS Review of Systems Negative unless indicated in HPI.    Objective:    Physical Exam Constitutional:      Appearance: Normal appearance.  HENT:     Mouth/Throat:     Mouth: Mucous membranes are moist.  Cardiovascular:     Rate and Rhythm: Normal rate and regular rhythm.  Pulses: Normal pulses.     Heart sounds: Normal heart sounds.  Pulmonary:     Effort: Pulmonary effort is normal.     Breath sounds: Normal breath sounds.  Abdominal:     General: Bowel sounds are normal.     Palpations: Abdomen is soft.  Musculoskeletal:     Cervical back: Normal range of motion. No tenderness.  Neurological:      General: No focal deficit present.     Mental Status: She is alert and oriented to person, place, and time.  Psychiatric:        Mood and Affect: Mood normal.        Behavior: Behavior normal.     BP 130/82   Pulse 74   Temp 98.3 F (36.8 C)   Ht 4' 11 (1.499 m)   Wt 139 lb (63 kg)   SpO2 97%   BMI 28.07 kg/m  Wt Readings from Last 3 Encounters:  08/15/24 139 lb (63 kg)  08/07/24 138 lb 12.8 oz (63 kg)  07/23/24 143 lb (64.9 kg)     Health Maintenance  Topic Date Due   Medicare Annual Wellness (AWV)  12/15/2023   COVID-19 Vaccine (11 - 2025-26 season) 08/22/2024 (Originally 06/17/2024)   Influenza Vaccine  01/14/2025 (Originally 05/17/2024)   DTaP/Tdap/Td (3 - Td or Tdap) 04/14/2033   Pneumococcal Vaccine: 50+ Years  Completed   DEXA SCAN  Completed   Zoster Vaccines- Shingrix  Completed   Meningococcal B Vaccine  Aged Out    There are no preventive care reminders to display for this patient.  Lab Results  Component Value Date   TSH 0.75 03/18/2024   Lab Results  Component Value Date   WBC 5.9 07/23/2024   HGB 11.5 (L) 07/23/2024   HCT 33.8 (L) 07/23/2024   MCV 99.5 07/23/2024   PLT 245.0 07/23/2024   Lab Results  Component Value Date   NA 142 07/23/2024   K 3.3 (L) 07/23/2024   CO2 29 07/23/2024   GLUCOSE 134 (H) 07/23/2024   BUN 20 07/23/2024   CREATININE 0.69 07/23/2024   BILITOT 0.5 07/23/2024   ALKPHOS 66 07/23/2024   AST 23 07/23/2024   ALT 17 07/23/2024   PROT 6.5 07/23/2024   ALBUMIN 4.0 07/23/2024   CALCIUM  9.6 07/23/2024   ANIONGAP 11 11/17/2020   GFR 80.97 07/23/2024   Lab Results  Component Value Date   CHOL 164 03/18/2024   Lab Results  Component Value Date   HDL 91.40 03/18/2024   Lab Results  Component Value Date   LDLCALC 61 03/18/2024   Lab Results  Component Value Date   TRIG 58.0 03/18/2024   Lab Results  Component Value Date   CHOLHDL 2 03/18/2024   Lab Results  Component Value Date   HGBA1C 5.7 08/17/2022       Assessment & Plan:   Assessment & Plan Diarrhea, unspecified type Chronic diarrhea for three months with liquid stools, no blood or mucus. Negative GI panel and C. diff tests in July. Symptoms persist despite antibiotics, probiotics, and prebiotics. - Refer to [facility] for gastroenterology evaluation. - Provide stool specimen collection kit for repeat GI panel and C. diff testing. - Advise to drop off specimen at the hospital.  Orders:   C Difficile Quick Screen w PCR reflex; Future   GI pathogen panel by PCR, stool; Future   Ambulatory referral to Gastroenterology    Follow-up: Return if symptoms worsen or fail to improve.   Vola Beneke  Vincente, NP

## 2024-08-15 NOTE — Assessment & Plan Note (Signed)
 Chronic diarrhea for three months with liquid stools, no blood or mucus. Negative GI panel and C. diff tests in July. Symptoms persist despite antibiotics, probiotics, and prebiotics. - Refer to [facility] for gastroenterology evaluation. - Provide stool specimen collection kit for repeat GI panel and C. diff testing. - Advise to drop off specimen at the hospital.  Orders:   C Difficile Quick Screen w PCR reflex; Future   GI pathogen panel by PCR, stool; Future   Ambulatory referral to Gastroenterology

## 2024-08-16 ENCOUNTER — Other Ambulatory Visit
Admission: RE | Admit: 2024-08-16 | Discharge: 2024-08-16 | Disposition: A | Discharge status: Home / Self Care | Source: Ambulatory Visit | Attending: Nurse Practitioner | Admitting: Nurse Practitioner

## 2024-08-16 DIAGNOSIS — R197 Diarrhea, unspecified: Secondary | ICD-10-CM | POA: Insufficient documentation

## 2024-08-16 LAB — GASTROINTESTINAL PANEL BY PCR, STOOL (REPLACES STOOL CULTURE)

## 2024-08-16 LAB — C DIFFICILE QUICK SCREEN W PCR REFLEX
C Diff antigen: NEGATIVE
C Diff interpretation: NOT DETECTED
C Diff toxin: NEGATIVE

## 2024-08-17 ENCOUNTER — Ambulatory Visit: Payer: Self-pay | Admitting: Nurse Practitioner

## 2024-08-20 NOTE — Telephone Encounter (Signed)
 FYI Dr Onesimo  [place] Pt re evaluated by Charan 10/30; negative GI pathogen panel, Cdiff  She has appt with [facility] 11/13/23  Referral to [facility] 10/16 -what is status of [facility]?  Charan placed a new referral per patient preference to [facility].  Do you have a status update here?

## 2024-08-21 DIAGNOSIS — K529 Noninfective gastroenteritis and colitis, unspecified: Secondary | ICD-10-CM | POA: Diagnosis not present

## 2024-08-21 DIAGNOSIS — K52839 Microscopic colitis, unspecified: Secondary | ICD-10-CM | POA: Diagnosis not present

## 2024-08-27 ENCOUNTER — Encounter: Attending: Physician Assistant | Admitting: Physician Assistant

## 2024-08-27 DIAGNOSIS — I10 Essential (primary) hypertension: Secondary | ICD-10-CM | POA: Diagnosis not present

## 2024-08-27 DIAGNOSIS — Z87891 Personal history of nicotine dependence: Secondary | ICD-10-CM | POA: Diagnosis not present

## 2024-08-27 DIAGNOSIS — I87331 Chronic venous hypertension (idiopathic) with ulcer and inflammation of right lower extremity: Secondary | ICD-10-CM | POA: Diagnosis not present

## 2024-08-27 DIAGNOSIS — S81811A Laceration without foreign body, right lower leg, initial encounter: Secondary | ICD-10-CM | POA: Insufficient documentation

## 2024-08-27 DIAGNOSIS — L97812 Non-pressure chronic ulcer of other part of right lower leg with fat layer exposed: Secondary | ICD-10-CM | POA: Insufficient documentation

## 2024-08-27 DIAGNOSIS — X58XXXA Exposure to other specified factors, initial encounter: Secondary | ICD-10-CM | POA: Insufficient documentation

## 2024-08-28 ENCOUNTER — Encounter

## 2024-08-28 DIAGNOSIS — Z87891 Personal history of nicotine dependence: Secondary | ICD-10-CM | POA: Diagnosis not present

## 2024-08-28 DIAGNOSIS — I87331 Chronic venous hypertension (idiopathic) with ulcer and inflammation of right lower extremity: Secondary | ICD-10-CM | POA: Diagnosis not present

## 2024-08-28 DIAGNOSIS — L97812 Non-pressure chronic ulcer of other part of right lower leg with fat layer exposed: Secondary | ICD-10-CM | POA: Diagnosis not present

## 2024-08-28 DIAGNOSIS — S81811A Laceration without foreign body, right lower leg, initial encounter: Secondary | ICD-10-CM | POA: Diagnosis not present

## 2024-08-28 DIAGNOSIS — I10 Essential (primary) hypertension: Secondary | ICD-10-CM | POA: Diagnosis not present

## 2024-09-04 ENCOUNTER — Encounter: Admission: RE | Payer: Self-pay

## 2024-09-04 ENCOUNTER — Ambulatory Visit: Admission: RE | Admit: 2024-09-04 | Source: Home / Self Care | Admitting: Gastroenterology

## 2024-09-04 SURGERY — SIGMOIDOSCOPY, FLEXIBLE

## 2024-09-05 DIAGNOSIS — H353131 Nonexudative age-related macular degeneration, bilateral, early dry stage: Secondary | ICD-10-CM | POA: Diagnosis not present

## 2024-09-05 DIAGNOSIS — H35373 Puckering of macula, bilateral: Secondary | ICD-10-CM | POA: Diagnosis not present

## 2024-09-05 DIAGNOSIS — Z961 Presence of intraocular lens: Secondary | ICD-10-CM | POA: Diagnosis not present

## 2024-09-09 ENCOUNTER — Encounter: Payer: Self-pay | Admitting: Cardiology

## 2024-09-09 ENCOUNTER — Ambulatory Visit: Attending: Cardiology | Admitting: Cardiology

## 2024-09-09 VITALS — BP 118/50 | HR 75 | Ht 60.0 in | Wt 135.6 lb

## 2024-09-09 DIAGNOSIS — I2510 Atherosclerotic heart disease of native coronary artery without angina pectoris: Secondary | ICD-10-CM

## 2024-09-09 DIAGNOSIS — I10 Essential (primary) hypertension: Secondary | ICD-10-CM | POA: Diagnosis not present

## 2024-09-09 NOTE — Progress Notes (Signed)
 Cardiology Office Note:    Date:  09/09/2024   ID:  ANNALISA COLONNA, DOB Sep 06, 1941, MRN [id number]  PCP:  Dineen Rollene MATSU, FNP   [facility] Providers Cardiologist:  None     Referring MD: Dineen Rollene MATSU, FNP   Chief Complaint  Patient presents with   Follow-up    3  month follow up pt has been doing well with no complaints of chest pain, chest pressure or SOB, medciation reviewed verbally with patient    History of Present Illness:    Claudia Andersen is a 83 y.o. female with a hx of CAD (LAD calcifications on chest CT ), hypertension, hyperlipidemia, former smoker x 20 years who presents following up.  Patient was last seen due to coronary calcifications noted on chest CT.  She denies chest pain or shortness of breath.  Aspirin  81 mg daily was started.  Also takes Lipitor 10 mg daily.  Tolerating all medications with no adverse effects.  She feels well, presents for echocardiogram results.   Past Medical History:  Diagnosis Date   Ankle fracture, bimalleolar, closed, right, initial encounter 10/26/2018   d/t fall 10/26/18   Arthritis    generalized   Back abrasion    CRUSHED VERTEBRAE   Cataract    bilateral sx    Closed fracture of shaft of fibula 05/11/2020   Closed right radial fracture 10/26/2018   s/p a fall   Compression of lumbar vertebra (HCC)    L1 and L4 (from fall November 05, 2018)   Degenerative disc disease, lumbar    Depression    hx of-on meds   Gallstones    GERD (gastroesophageal reflux disease)    on meds   Migraines    Osteoporosis    on Prolia  inj   Pulmonary emboli (HCC)    history of emboli    Past Surgical History:  Procedure Laterality Date   ABDOMINAL HYSTERECTOMY     APPENDECTOMY     CATARACT EXTRACTION W/PHACO Right 03/27/2018   Procedure: CATARACT EXTRACTION PHACO AND INTRAOCULAR LENS PLACEMENT (IOC);  Surgeon: Jaye Fallow, MD;  Location: [facility];  Service: Ophthalmology;  Laterality: Right;  US  00:33 AP%  15.3 CDE 5.18 Fluid pack lot # [id number]   CATARACT EXTRACTION W/PHACO Left 06/26/2018   Procedure: CATARACT EXTRACTION PHACO AND INTRAOCULAR LENS PLACEMENT (IOC);  Surgeon: Jaye Fallow, MD;  Location: [facility];  Service: Ophthalmology;  Laterality: Left;  US  00:38 AP% 15.2 CDE 5.80 Fluid pack lot # [id number]   EYE SURGERY  Cataract surgery on both eyes   FRACTURE SURGERY Right    RIGHT RADIAL wrist   JOINT REPLACEMENT     METATARSAL HEAD EXCISION Right 04/01/2021   Procedure: METATARSAL HEAD EXCISION- Tailors Exostectomy -Right;  Surgeon: Lennie Barter, DPM;  Location: [facility];  Service: Podiatry;  Laterality: Right;  CANNOT arrive before 9:30 Anesthesia- Mac + local   TONSILLECTOMY AND ADENOIDECTOMY     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   Procedure: TOTAL HIP ARTHROPLASTY ANTERIOR APPROACH;  Surgeon: Leora Lynwood SAUNDERS, MD;  Location: [facility];  Service: Orthopedics;  Laterality: Right;   TOTAL KNEE ARTHROPLASTY Left    TRIPLE SUBTALAR FUSION     WISDOM TOOTH EXTRACTION      Current Medications: Current Meds  Medication Sig   acetaminophen  (TYLENOL ) 650 MG CR tablet Take 1,300 mg by mouth 2 (two) times daily.   aspirin  EC 81 MG tablet Take 1 tablet (81 mg total) by mouth  daily. Swallow whole.   atorvastatin  (LIPITOR) 10 MG tablet TAKE ONE TABLET BY MOUTH EVERY DAY   baclofen  (LIORESAL ) 10 MG tablet TAKE 1 TABLET BY MOUTH 3 TIMES DAILY AS NEEDED FOR MUSCLE SPASM   Biotin 2500 MCG CAPS Take 2,500 mcg by mouth daily.   Calcium  Carb-Cholecalciferol (CALCIUM  600 + D PO) Take 1 tablet by mouth daily.   Clotrimazole  1 % OINT Apply 1 application topically 2 (two) times daily.   diclofenac  Sodium (VOLTAREN ) 1 % GEL Apply 4 g topically 4 (four) times daily.   diphenoxylate -atropine  (LOMOTIL ) 2.5-0.025 MG tablet Take 1 tablet by mouth 2 (two) times daily as needed for diarrhea or loose stools.   doxycycline  (VIBRA -TABS) 100 MG tablet Take 1 tablet (100 mg total) by mouth 2  (two) times daily.   DULoxetine  (CYMBALTA ) 60 MG capsule Take 1 capsule (60 mg total) by mouth daily.   famotidine  (PEPCID ) 20 MG tablet Take 1 tablet (20 mg total) by mouth 2 (two) times daily.   Fluticasone  Furoate (FLONASE  SENSIMIST NA) Place 1 spray into both nostrils daily as needed (allergies).   gabapentin  (NEURONTIN ) 100 MG capsule TAKE 2 CAPSULES BY MOUTH EVERY MORNING, AND 2 CAPSULES IN MIDDAY.   gabapentin  (NEURONTIN ) 300 MG capsule TAKE 1 CAPSULE BY MOUTH EVERY EVENING ASDIRECTED   Homeopathic Products (ARNICA EX) Apply 1 Application topically as needed (apply to broken skin as needed.).   hydrochlorothiazide  (MICROZIDE ) 12.5 MG capsule TAKE 1 CAPSULE BY MOUTH DAILY AS NEEDED.   Lactobacillus (PROBIOTIC ACIDOPHILUS PO) Take 1 tablet by mouth daily.   lidocaine  (LIDODERM ) 5 % Place 1 patch onto the skin as needed (1 every 24 hours). Remove & Discard patch within 12 hours or as directed by MD   Multiple Vitamin (MULTIVITAMIN) tablet Take 1 tablet by mouth daily.   Multiple Vitamins-Minerals (ICAPS AREDS FORMULA PO) Take 1 tablet by mouth 2 (two) times daily.   mupirocin  ointment (BACTROBAN ) 2 % Apply 1 application topically 2 (two) times daily.   nitrofurantoin , macrocrystal-monohydrate, (MACROBID ) 100 MG capsule Take 1 capsule (100 mg total) by mouth 2 (two) times daily. Take with food.   pantoprazole  (PROTONIX ) 40 MG tablet TAKE ONE TABLET (40 MG) BY MOUTH EVERY DAY 1 HOUR BEFORE BREAKFAST   potassium chloride  SA (KLOR-CON  M) 20 MEQ tablet Take 1 tablet (20 mEq total) by mouth daily.   traZODone  (DESYREL ) 50 MG tablet TAKE TWO TABLETS BY MOUTH AT BEDTIME   triamcinolone  (KENALOG ) 0.025 % cream Apply 1 application topically 2 (two) times daily.   valACYclovir  (VALTREX ) 1000 MG tablet TAKE TWO TABLETS BY MOUTH EVERY 12 HOURSFOR 1 DAY. **START AS SOON AS POSSIBLE AFTER SYMPTOM ONSET**     Allergies:   Nsaids   Social History   Socioeconomic History   Marital status: Divorced     Spouse name: Not on file   Number of children: 1   Years of education: Not on file   Highest education level: Not on file  Occupational History   Occupation: retired  Tobacco Use   Smoking status: Former    Current packs/day: 0.00    Average packs/day: 0.5 packs/day for 22.0 years (11.0 ttl pk-yrs)    Types: Cigarettes    Start date: 62    Quit date: 1982    Years since quitting: 43.9   Smokeless tobacco: Never  Vaping Use   Vaping status: Never Used  Substance and Sexual Activity   Alcohol use: Yes    Alcohol/week: 10.0 standard drinks of  alcohol    Types: 10 Standard drinks or equivalent per week   Drug use: Never   Sexual activity: Not on file  Other Topics Concern   Not on file  Social History Narrative   Retired Museum/gallery Conservator      From [place].    Lives at [facility]   Social Drivers of Health   Financial Resource Strain: Patient Unable To Answer (08/21/2024)   Received from [facility]   Overall Financial Resource Strain (CARDIA)    Difficulty of Paying Living Expenses: Patient unable to answer  Food Insecurity: Patient Unable To Answer (08/21/2024)   Received from [facility]   Hunger Vital Sign    Within the past 12 months, you worried that your food would run out before you got the money to buy more.: Patient unable to answer    Within the past 12 months, the food you bought just didn't last and you didn't have money to get more.: Patient unable to answer  Transportation Needs: Patient Unable To Answer (08/21/2024)   Received from [facility]    In the past 12 months, has lack of transportation kept you from medical appointments or from getting medications?: Patient unable to answer    Lack of Transportation (Non-Medical): Patient unable to answer  Physical Activity: Sufficiently Active (10/26/2021)   Exercise Vital Sign    Days of Exercise per Week: 7 days    Minutes of Exercise per  Session: 60 min  Stress: No Stress Concern Present (10/26/2021)   [employer] - Occupational Stress Questionnaire    Feeling of Stress : Not at all  Social Connections: Not on file     Family History: The patient's family history includes Arthritis in her mother; Heart disease in her maternal grandfather, paternal grandfather, and paternal grandmother; Heart disease (age of onset: 50) in her father; Hemachromatosis in her mother; Pneumonia in her maternal grandmother; Stroke (age of onset: 74) in her mother. There is no history of Breast cancer, Colon cancer, Colon polyps, Esophageal cancer, Rectal cancer, or Stomach cancer.  ROS:   Please see the history of present illness.     All other systems reviewed and are negative.  EKGs/Labs/Other Studies Reviewed:    The following studies were reviewed today:       Recent Labs: 03/18/2024: TSH 0.75 07/23/2024: ALT 17; BUN 20; Creatinine, Ser 0.69; Hemoglobin 11.5; Platelets 245.0; Potassium 3.3; Sodium 142  Recent Lipid Panel    Component Value Date/Time   CHOL 164 03/18/2024 0924   TRIG 58.0 03/18/2024 0924   HDL 91.40 03/18/2024 0924   CHOLHDL 2 03/18/2024 0924   VLDL 11.6 03/18/2024 0924   LDLCALC 61 03/18/2024 0924     Risk Assessment/Calculations:             Physical Exam:    VS:  BP (!) 118/50 (BP Location: Left Arm, Patient Position: Sitting, Cuff Size: Normal)   Pulse 75   Ht 5' (1.524 m)   Wt 135 lb 9.6 oz (61.5 kg)   SpO2 97%   BMI 26.48 kg/m     Wt Readings from Last 3 Encounters:  09/09/24 135 lb 9.6 oz (61.5 kg)  08/15/24 139 lb (63 kg)  08/07/24 138 lb 12.8 oz (63 kg)     GEN:  Well nourished, well developed in no acute distress HEENT: Normal NECK: No JVD; No carotid bruits CARDIAC: RRR, no murmurs,  rubs, gallops RESPIRATORY:  Clear to auscultation without rales, wheezing or rhonchi  ABDOMEN: Soft, non-tender, non-distended MUSCULOSKELETAL:  No edema; No deformity  SKIN:  Warm and dry NEUROLOGIC:  Alert and oriented x 3 PSYCHIATRIC:  Normal affect   ASSESSMENT:    1. Coronary artery disease involving native coronary artery of native heart, unspecified whether angina present   2. Primary hypertension    PLAN:    In order of problems listed above:  LAD calcification, aortic atherosclerosis on chest CT.  Denies chest pain.  Echo 9/25 EF 60 to 65%,.  Cholesterol controlled, LDL at goal.  Continue aspirin  81 mg daily, Lipitor 10 mg daily. Hypertension, BP controlled.  Continue HCTZ 12.5 mg daily.  Follow-up in 1 year     Medication Adjustments/Labs and Tests Ordered: Current medicines are reviewed at length with the patient today.  Concerns regarding medicines are outlined above.  No orders of the defined types were placed in this encounter.  No orders of the defined types were placed in this encounter.   Patient Instructions  Medication Instructions:  Your physician recommends that you continue on your current medications as directed. Please refer to the Current Medication list given to you today.   *If you need a refill on your cardiac medications before your next appointment, please call your pharmacy*  Lab Work: No labs ordered today  If you have labs (blood work) drawn today and your tests are completely normal, you will receive your results only by: MyChart Message (if you have MyChart) OR A paper copy in the mail If you have any lab test that is abnormal or we need to change your treatment, we will call you to review the results.  Testing/Procedures: No test ordered today   Follow-Up: At [facility], you and your health needs are our priority.  As part of our continuing mission to provide you with exceptional heart care, our providers are all part of one team.  This team includes your primary Cardiologist (physician) and Advanced Practice Providers or APPs (Physician Assistants and Nurse Practitioners) who all work together to  provide you with the care you need, when you need it.  Your next appointment:   1 year(s)  Provider:   You may see Dr Darliss or one of the following Advanced Practice Providers on your designated Care Team:   Lonni Meager, NP Lesley Maffucci, PA-C Bernardino Bring, PA-C Cadence [place], PA-C Tylene Lunch, NP Barnie Hila, NP    We recommend signing up for the patient portal called MyChart.  Sign up information is provided on this After Visit Summary.  MyChart is used to connect with patients for Virtual Visits (Telemedicine).  Patients are able to view lab/test results, encounter notes, upcoming appointments, etc.  Non-urgent messages can be sent to your provider as well.   To learn more about what you can do with MyChart, go to [email or web address].             Signed, Redell Darliss, MD  09/09/2024 3:11 PM    [facility]

## 2024-09-09 NOTE — Patient Instructions (Signed)
 Medication Instructions:  Your physician recommends that you continue on your current medications as directed. Please refer to the Current Medication list given to you today.   *If you need a refill on your cardiac medications before your next appointment, please call your pharmacy*  Lab Work: No labs ordered today  If you have labs (blood work) drawn today and your tests are completely normal, you will receive your results only by: MyChart Message (if you have MyChart) OR A paper copy in the mail If you have any lab test that is abnormal or we need to change your treatment, we will call you to review the results.  Testing/Procedures: No test ordered today   Follow-Up: At [facility], you and your health needs are our priority.  As part of our continuing mission to provide you with exceptional heart care, our providers are all part of one team.  This team includes your primary Cardiologist (physician) and Advanced Practice Providers or APPs (Physician Assistants and Nurse Practitioners) who all work together to provide you with the care you need, when you need it.  Your next appointment:   1 year(s)  Provider:   You may see Dr. Darliss or one of the following Advanced Practice Providers on your designated Care Team:   Lonni Meager, NP Lesley Maffucci, PA-C Bernardino Bring, PA-C Cadence [place], PA-C Tylene Lunch, NP Barnie Hila, NP    We recommend signing up for the patient portal called MyChart.  Sign up information is provided on this After Visit Summary.  MyChart is used to connect with patients for Virtual Visits (Telemedicine).  Patients are able to view lab/test results, encounter notes, upcoming appointments, etc.  Non-urgent messages can be sent to your provider as well.   To learn more about what you can do with MyChart, go to [email or web address].

## 2024-09-10 ENCOUNTER — Encounter: Admitting: Physician Assistant

## 2024-09-10 DIAGNOSIS — Z87891 Personal history of nicotine dependence: Secondary | ICD-10-CM | POA: Diagnosis not present

## 2024-09-10 DIAGNOSIS — L97812 Non-pressure chronic ulcer of other part of right lower leg with fat layer exposed: Secondary | ICD-10-CM | POA: Diagnosis not present

## 2024-09-10 DIAGNOSIS — I87331 Chronic venous hypertension (idiopathic) with ulcer and inflammation of right lower extremity: Secondary | ICD-10-CM | POA: Diagnosis not present

## 2024-09-10 DIAGNOSIS — I10 Essential (primary) hypertension: Secondary | ICD-10-CM | POA: Diagnosis not present

## 2024-09-10 DIAGNOSIS — S81811A Laceration without foreign body, right lower leg, initial encounter: Secondary | ICD-10-CM | POA: Diagnosis not present

## 2024-09-18 DIAGNOSIS — M810 Age-related osteoporosis without current pathological fracture: Secondary | ICD-10-CM | POA: Diagnosis not present

## 2024-09-20 ENCOUNTER — Other Ambulatory Visit: Payer: Self-pay | Admitting: Family

## 2024-09-20 DIAGNOSIS — E785 Hyperlipidemia, unspecified: Secondary | ICD-10-CM

## 2024-09-23 DIAGNOSIS — M1711 Unilateral primary osteoarthritis, right knee: Secondary | ICD-10-CM | POA: Diagnosis not present

## 2024-09-24 ENCOUNTER — Encounter: Admitting: Internal Medicine

## 2024-10-02 ENCOUNTER — Ambulatory Visit: Payer: Self-pay

## 2024-10-02 NOTE — Telephone Encounter (Signed)
 Called pt vm was full did send detailed my chart message to see if she felt like any of the onset of sxs were new because pt will check her my chart

## 2024-10-02 NOTE — Telephone Encounter (Signed)
 FYI Only or Action Required?: Action required by provider: request for appointment.declined UC/ ER  Patient was last seen in primary care on 08/15/2024 by Vincente Saber, NP.  Called Nurse Triage reporting Altered Mental Status.  Symptoms began today.  Interventions attempted: Nothing.  Symptoms are: completely resolved.  Triage Disposition: See HCP Within 4 Hours (Or PCP Triage)  Patient/caregiver understands and will follow disposition?:  Was at PT today and had episode of confusion that quickly resolved Copied from CRM #[id number]. Topic: Clinical - Red Word Triage >> Oct 02, 2024  3:57 PM Anairis L wrote: [facility] that prompted transfer to Nurse Triage: feels out of it, feels scared. Reason for Disposition  [1] Acting confused (e.g., disoriented, slurred speech) AND [2] brief (now gone)  Answer Assessment - Initial Assessment Questions 1. LEVEL OF CONSCIOUSNESS: How are they (the patient) acting right now? (e.g., alert-oriented, confused, lethargic, stuporous, comatose)    Patient a&o at this time 2. ONSET: When did the confusion start?  (e.g., minutes, hours, days)     today 3. PATTERN: Does this come and go, or has it been constant since it started?  Is it present now?     INTERMITTENT 4. ALCOHOL or DRUGS: Have they been drinking alcohol or taking any drugs?       5. NARCOTIC MEDICINES: Have they been receiving any narcotic medications? (e.g., morphine , Vicodin)     none 6. CAUSE: What do you think is causing the confusion?      unknown 7. OTHER SYMPTOMS: Are there any other symptoms? (e.g., difficulty breathing, fever, headache, weakness)     Denies other symptoms  Protocols used: Confusion - Delirium-A-AH

## 2024-10-03 ENCOUNTER — Encounter: Attending: Internal Medicine | Admitting: Internal Medicine

## 2024-10-03 DIAGNOSIS — L97812 Non-pressure chronic ulcer of other part of right lower leg with fat layer exposed: Secondary | ICD-10-CM | POA: Insufficient documentation

## 2024-10-03 DIAGNOSIS — X58XXXA Exposure to other specified factors, initial encounter: Secondary | ICD-10-CM | POA: Insufficient documentation

## 2024-10-03 DIAGNOSIS — I10 Essential (primary) hypertension: Secondary | ICD-10-CM | POA: Insufficient documentation

## 2024-10-03 DIAGNOSIS — I87331 Chronic venous hypertension (idiopathic) with ulcer and inflammation of right lower extremity: Secondary | ICD-10-CM | POA: Diagnosis present

## 2024-10-03 DIAGNOSIS — S81811A Laceration without foreign body, right lower leg, initial encounter: Secondary | ICD-10-CM | POA: Diagnosis not present

## 2024-10-04 ENCOUNTER — Encounter: Payer: Self-pay | Admitting: Family

## 2024-10-04 NOTE — Telephone Encounter (Signed)
 Noted. Pt is aware

## 2024-10-04 NOTE — Telephone Encounter (Signed)
 Call pt  Agree with disposition to go to ED for altered mental status  She would need stat in person evaluation AND likely neuroimaging to rule out stroke to be safe  Please re iterate NOT to delay care to seek evaluation today

## 2024-10-04 NOTE — Telephone Encounter (Signed)
 Spoke to pt she states that she saw PT on Wed and she states that she doe not have any recollection of PT asking her can she check her BP and then she was at home in her [place] and she just spaced out for about 2-3 mins. She said it freaked her out and she will go to [facility] walk in if it happens again before her appt with you on 10/16/24

## 2024-10-04 NOTE — Telephone Encounter (Signed)
 See telephone note 10/04/24

## 2024-10-11 ENCOUNTER — Other Ambulatory Visit: Payer: Self-pay | Admitting: Family

## 2024-10-11 DIAGNOSIS — M51379 Other intervertebral disc degeneration, lumbosacral region without mention of lumbar back pain or lower extremity pain: Secondary | ICD-10-CM

## 2024-10-11 DIAGNOSIS — F32A Depression, unspecified: Secondary | ICD-10-CM

## 2024-10-16 ENCOUNTER — Encounter: Admitting: Physician Assistant

## 2024-10-16 ENCOUNTER — Ambulatory Visit: Admitting: Family

## 2024-10-16 ENCOUNTER — Encounter: Payer: Self-pay | Admitting: Family

## 2024-10-16 VITALS — BP 124/72 | HR 60 | Temp 97.9°F | Ht 59.0 in | Wt 130.4 lb

## 2024-10-16 DIAGNOSIS — R899 Unspecified abnormal finding in specimens from other organs, systems and tissues: Secondary | ICD-10-CM | POA: Diagnosis not present

## 2024-10-16 DIAGNOSIS — I87331 Chronic venous hypertension (idiopathic) with ulcer and inflammation of right lower extremity: Secondary | ICD-10-CM | POA: Diagnosis not present

## 2024-10-16 DIAGNOSIS — R300 Dysuria: Secondary | ICD-10-CM

## 2024-10-16 DIAGNOSIS — R4182 Altered mental status, unspecified: Secondary | ICD-10-CM

## 2024-10-16 DIAGNOSIS — F32A Depression, unspecified: Secondary | ICD-10-CM

## 2024-10-16 NOTE — Progress Notes (Signed)
 "  Assessment & Plan:  Altered mental status, unspecified altered mental status type Assessment & Plan: 6-week ago history of self-limiting state of altered mental consciousness.  Unwitnessed.  There is been no repeat of event.  Advised MRI brain.  Patient declines further evaluation at this time in absence of symptoms, recurrence.  Family history of stroke and discussed cerebral mild chronic small vessel ischemic changes seen on prior MRI.  Strongly reiterated to present to emergency room if episode were to recur for stat neuroimaging.  She verbalized understanding.   Abnormal laboratory test -     Basic metabolic panel with GFR; Future -     CBC with Differential/Platelet; Future -     Iron, TIBC and Ferritin Panel; Future -     B12 and Folate Panel; Future -     Urinalysis, Routine w reflex microscopic; Future  Dysuria -     Urine Culture -     Urinalysis, Routine w reflex microscopic  Depressive disorder Assessment & Plan: Chronic,stable. Continue cymbalta  60mg  every day, trazodone  100mg  qd   Other orders -     MICROSCOPIC MESSAGE     Return precautions given.   Risks, benefits, and alternatives of the medications and treatment plan prescribed today were discussed, and patient expressed understanding.   Education regarding symptom management and diagnosis given to patient on AVS either electronically or printed.  No follow-ups on file.  Claudia Northern, FNP  Subjective:    Patient ID: Claudia Andersen, female    DOB: May 18, 1941, 83 y.o.   MRN: [id number]  CC: SIBYL Andersen is a 83 y.o. female who presents today for an acute visit.    HPI: HPI Discussed the use of AI scribe software for clinical note transcription with the patient, who gave verbal consent to proceed.  History of Present Illness   Claudia Andersen is an 83 year old female who presents for follow-up after an episode of altered mental status.  Approximately six weeks ago, she experienced an  episode of altered mental status while standing in her dining room, described as 'spacing out' for three to four minutes without falling, losing consciousness, loss of bowel or bladder, or experiencing dizziness. There were no witnesses, but she was aware during the episode and recalls it clearly. There has been no recurrence of similar episodes since then. Denies associated headaches, vision changes, or lightheadedness.  She does does not recall feeling incredibly tired after the event.  Known h/o  cerebral chronic small vessel disease. Her family history is significant for stroke, with her mother having died of a stroke at age 1, and her father having died of a heart attack at age 41.  She has been diagnosed with microscopic colitis and is currently on budesonide and Imodium, which have helped stabilize her weight. Her weight is now stable at 128 pounds, slightly below her goal weight of 130 pounds.  She is being treated for depression with Cymbalta  60 mg daily and trazodone  100mg  at bedtime, which are effective.   MRI brian 02/2024  1.  No evidence of an acute intracranial abnormality. 2. Mild chronic small vessel ischemic changes within the cerebral white matter. 3. 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 4. Mild generalized parenchymal atrophy.  Allergies: Nsaids Medications Ordered Prior to Encounter[1]  Review of Systems  Constitutional:  Negative for chills and fever.  Respiratory:  Negative for cough.   Cardiovascular:  Negative for chest pain and palpitations.  Gastrointestinal:  Negative for nausea and vomiting.  Psychiatric/Behavioral:  Negative for sleep disturbance.       Objective:    BP 124/72   Pulse 60   Temp 97.9 F (36.6 C) (Oral)   Ht 4' 11 (1.499 m)   Wt 130 lb 6.4 oz (59.1 kg)   SpO2 98%   BMI 26.34 kg/m   BP Readings from Last 3 Encounters:  10/16/24 124/72  09/09/24 (!) 118/50  08/15/24 130/82   Wt Readings from Last  3 Encounters:  10/16/24 130 lb 6.4 oz (59.1 kg)  09/09/24 135 lb 9.6 oz (61.5 kg)  08/15/24 139 lb (63 kg)    Physical Exam Vitals reviewed.  Constitutional:      Appearance: She is well-developed.  Eyes:     Conjunctiva/sclera: Conjunctivae normal.  Cardiovascular:     Rate and Rhythm: Normal rate and regular rhythm.     Pulses: Normal pulses.     Heart sounds: Normal heart sounds.  Pulmonary:     Effort: Pulmonary effort is normal.     Breath sounds: Normal breath sounds. No wheezing, rhonchi or rales.  Skin:    General: Skin is warm and dry.  Neurological:     Mental Status: She is alert.  Psychiatric:        Speech: Speech normal.        Behavior: Behavior normal.        Thought Content: Thought content normal.           [1]  Current Outpatient Medications on File Prior to Visit  Medication Sig Dispense Refill   acetaminophen  (TYLENOL ) 650 MG CR tablet Take 1,300 mg by mouth 2 (two) times daily.     aspirin  EC 81 MG tablet Take 1 tablet (81 mg total) by mouth daily. Swallow whole.     atorvastatin  (LIPITOR) 10 MG tablet TAKE ONE TABLET BY MOUTH ONCE DAILY 90 tablet 3   baclofen  (LIORESAL ) 10 MG tablet TAKE 1 TABLET BY MOUTH 3 TIMES DAILY AS NEEDED FOR MUSCLE SPASM 90 each 2   Biotin 2500 MCG CAPS Take 2,500 mcg by mouth daily.     Calcium  Carb-Cholecalciferol (CALCIUM  600 + D PO) Take 1 tablet by mouth daily.     Clotrimazole  1 % OINT Apply 1 application topically 2 (two) times daily. 56.7 g 1   diclofenac  Sodium (VOLTAREN ) 1 % GEL Apply 4 g topically 4 (four) times daily. 100 g 1   doxycycline  (VIBRA -TABS) 100 MG tablet Take 1 tablet (100 mg total) by mouth 2 (two) times daily. 10 tablet 0   DULoxetine  (CYMBALTA ) 60 MG capsule TAKE 1 CAPSULE BY MOUTH ONCE DAILY 30 capsule 3   famotidine  (PEPCID ) 20 MG tablet TAKE ONE TABLET BY MOUTH TWICE DAILY 120 tablet 3   Fluticasone  Furoate (FLONASE  SENSIMIST NA) Place 1 spray into both nostrils daily as needed (allergies).      gabapentin  (NEURONTIN ) 100 MG capsule TAKE 2 CAPSULES BY MOUTH EVERY MORNING, AND 2 CAPSULES IN MIDDAY. 120 capsule 0   gabapentin  (NEURONTIN ) 300 MG capsule TAKE 1 CAPSULE BY MOUTH EVERY EVENING ASDIRECTED 90 capsule 3   Homeopathic Products (ARNICA EX) Apply 1 Application topically as needed (apply to broken skin as needed.).     hydrochlorothiazide  (MICROZIDE ) 12.5 MG capsule TAKE 1 CAPSULE BY MOUTH DAILY AS NEEDED. 90 capsule 3   Lactobacillus (PROBIOTIC ACIDOPHILUS PO) Take 1 tablet by mouth daily.     lidocaine  (LIDODERM ) 5 % Place 1 patch onto the skin as  needed (1 every 24 hours). Remove & Discard patch within 12 hours or as directed by MD     Multiple Vitamin (MULTIVITAMIN) tablet Take 1 tablet by mouth daily.     Multiple Vitamins-Minerals (ICAPS AREDS FORMULA PO) Take 1 tablet by mouth 2 (two) times daily.     mupirocin  ointment (BACTROBAN ) 2 % Apply 1 application topically 2 (two) times daily. 22 g 1   potassium chloride  SA (KLOR-CON  M) 20 MEQ tablet Take 1 tablet (20 mEq total) by mouth daily. 10 tablet 0   traZODone  (DESYREL ) 50 MG tablet TAKE TWO TABLETS BY MOUTH AT BEDTIME 180 tablet 3   triamcinolone  (KENALOG ) 0.025 % cream Apply 1 application topically 2 (two) times daily. 15 g 1   valACYclovir  (VALTREX ) 1000 MG tablet TAKE TWO TABLETS BY MOUTH EVERY 12 HOURSFOR 1 DAY. **START AS SOON AS POSSIBLE AFTER SYMPTOM ONSET** 30 tablet 1   No current facility-administered medications on file prior to visit.   "

## 2024-10-18 ENCOUNTER — Other Ambulatory Visit: Payer: Self-pay

## 2024-10-18 DIAGNOSIS — R300 Dysuria: Secondary | ICD-10-CM

## 2024-10-20 LAB — URINALYSIS, ROUTINE W REFLEX MICROSCOPIC
Bacteria, UA: NONE SEEN /HPF
Bilirubin Urine: NEGATIVE
Glucose, UA: NEGATIVE
Hgb urine dipstick: NEGATIVE
Hyaline Cast: NONE SEEN /LPF
Ketones, ur: NEGATIVE
Leukocytes,Ua: NEGATIVE
Nitrite: NEGATIVE
RBC / HPF: NONE SEEN /HPF (ref 0–2)
Specific Gravity, Urine: 1.025 (ref 1.001–1.035)
pH: 5.5 (ref 5.0–8.0)

## 2024-10-20 LAB — URINE CULTURE
MICRO NUMBER:: 17419591
Result:: NO GROWTH
SPECIMEN QUALITY:: ADEQUATE

## 2024-10-20 LAB — MICROSCOPIC MESSAGE

## 2024-10-21 DIAGNOSIS — R4182 Altered mental status, unspecified: Secondary | ICD-10-CM | POA: Insufficient documentation

## 2024-10-21 LAB — BASIC METABOLIC PANEL WITH GFR
BUN: 25 mg/dL (ref 7–25)
CO2: 28 mmol/L (ref 20–32)
Calcium: 9.5 mg/dL (ref 8.6–10.4)
Chloride: 103 mmol/L (ref 98–110)
Creat: 0.84 mg/dL (ref 0.60–0.95)
Glucose, Bld: 94 mg/dL (ref 65–99)
Potassium: 4.1 mmol/L (ref 3.5–5.3)
Sodium: 140 mmol/L (ref 135–146)
eGFR: 69 mL/min/1.73m2

## 2024-10-21 LAB — CBC WITH DIFFERENTIAL/PLATELET
Absolute Lymphocytes: 1400 {cells}/uL (ref 850–3900)
Absolute Monocytes: 1075 {cells}/uL — ABNORMAL HIGH (ref 200–950)
Basophils Absolute: 45 {cells}/uL (ref 0–200)
Basophils Relative: 0.4 %
Eosinophils Absolute: 123 {cells}/uL (ref 15–500)
Eosinophils Relative: 1.1 %
HCT: 37.1 % (ref 35.9–46.0)
Hemoglobin: 12.2 g/dL (ref 11.7–15.5)
MCH: 32.1 pg (ref 27.0–33.0)
MCHC: 32.9 g/dL (ref 31.6–35.4)
MCV: 97.6 fL (ref 81.4–101.7)
MPV: 11.3 fL (ref 7.5–12.5)
Monocytes Relative: 9.6 %
Neutro Abs: 8557 {cells}/uL — ABNORMAL HIGH (ref 1500–7800)
Neutrophils Relative %: 76.4 %
Platelets: 332 Thousand/uL (ref 140–400)
RBC: 3.8 Million/uL (ref 3.80–5.10)
RDW: 12.7 % (ref 11.0–15.0)
Total Lymphocyte: 12.5 %
WBC: 11.2 Thousand/uL — ABNORMAL HIGH (ref 3.8–10.8)

## 2024-10-21 LAB — B12 AND FOLATE PANEL
Folate: 18.4 ng/mL
Vitamin B-12: 869 pg/mL (ref 200–1100)

## 2024-10-21 LAB — IRON,TIBC AND FERRITIN PANEL
%SAT: 39 % (ref 16–45)
Ferritin: 51 ng/mL (ref 16–288)
Iron: 124 ug/dL (ref 45–160)
TIBC: 315 ug/dL (ref 250–450)

## 2024-10-21 NOTE — Assessment & Plan Note (Signed)
 6-week ago history of self-limiting state of altered mental consciousness.  Unwitnessed.  There is been no repeat of event.  Advised MRI brain.  Patient declines further evaluation at this time in absence of symptoms, recurrence.  Family history of stroke and discussed cerebral mild chronic small vessel ischemic changes seen on prior MRI.  Strongly reiterated to present to emergency room if episode were to recur for stat neuroimaging.  She verbalized understanding.

## 2024-10-21 NOTE — Assessment & Plan Note (Signed)
 Chronic,stable. Continue cymbalta  60mg  every day, trazodone  100mg  qd

## 2024-10-22 ENCOUNTER — Encounter: Payer: Self-pay | Admitting: Family

## 2024-10-24 ENCOUNTER — Encounter: Attending: Physician Assistant | Admitting: Physician Assistant

## 2024-10-24 DIAGNOSIS — I87331 Chronic venous hypertension (idiopathic) with ulcer and inflammation of right lower extremity: Secondary | ICD-10-CM | POA: Diagnosis not present

## 2024-10-24 DIAGNOSIS — I10 Essential (primary) hypertension: Secondary | ICD-10-CM | POA: Diagnosis not present

## 2024-10-24 DIAGNOSIS — L97812 Non-pressure chronic ulcer of other part of right lower leg with fat layer exposed: Secondary | ICD-10-CM | POA: Diagnosis not present

## 2024-10-24 DIAGNOSIS — X58XXXA Exposure to other specified factors, initial encounter: Secondary | ICD-10-CM | POA: Diagnosis not present

## 2024-10-24 DIAGNOSIS — S81811A Laceration without foreign body, right lower leg, initial encounter: Secondary | ICD-10-CM | POA: Insufficient documentation

## 2024-10-31 ENCOUNTER — Encounter: Admitting: Physician Assistant

## 2024-11-01 ENCOUNTER — Encounter: Payer: Self-pay | Admitting: Family

## 2024-11-01 ENCOUNTER — Ambulatory Visit: Payer: Self-pay | Admitting: Family

## 2024-11-01 ENCOUNTER — Ambulatory Visit: Admitting: Family

## 2024-11-01 VITALS — BP 124/70 | HR 70 | Temp 98.3°F | Ht 60.0 in | Wt 130.8 lb

## 2024-11-01 DIAGNOSIS — L989 Disorder of the skin and subcutaneous tissue, unspecified: Secondary | ICD-10-CM | POA: Diagnosis not present

## 2024-11-01 DIAGNOSIS — Z87898 Personal history of other specified conditions: Secondary | ICD-10-CM | POA: Diagnosis not present

## 2024-11-01 DIAGNOSIS — D72829 Elevated white blood cell count, unspecified: Secondary | ICD-10-CM | POA: Diagnosis not present

## 2024-11-01 DIAGNOSIS — R4182 Altered mental status, unspecified: Secondary | ICD-10-CM

## 2024-11-01 LAB — CBC WITH DIFFERENTIAL/PLATELET
Basophils Absolute: 0.1 K/uL (ref 0.0–0.1)
Basophils Relative: 0.6 % (ref 0.0–3.0)
Eosinophils Absolute: 0.1 K/uL (ref 0.0–0.7)
Eosinophils Relative: 1.7 % (ref 0.0–5.0)
HCT: 34.3 % — ABNORMAL LOW (ref 36.0–46.0)
Hemoglobin: 11.8 g/dL — ABNORMAL LOW (ref 12.0–15.0)
Lymphocytes Relative: 19.7 % (ref 12.0–46.0)
Lymphs Abs: 1.7 K/uL (ref 0.7–4.0)
MCHC: 34.5 g/dL (ref 30.0–36.0)
MCV: 97 fl (ref 78.0–100.0)
Monocytes Absolute: 1 K/uL (ref 0.1–1.0)
Monocytes Relative: 11.5 % (ref 3.0–12.0)
Neutro Abs: 5.8 K/uL (ref 1.4–7.7)
Neutrophils Relative %: 66.5 % (ref 43.0–77.0)
Platelets: 304 K/uL (ref 150.0–400.0)
RBC: 3.54 Mil/uL — ABNORMAL LOW (ref 3.87–5.11)
RDW: 14.9 % (ref 11.5–15.5)
WBC: 8.7 K/uL (ref 4.0–10.5)

## 2024-11-01 NOTE — Progress Notes (Unsigned)
 "  Assessment & Plan:  There are no diagnoses linked to this encounter.   Return precautions given.   Risks, benefits, and alternatives of the medications and treatment plan prescribed today were discussed, and patient expressed understanding.   Education regarding symptom management and diagnosis given to patient on AVS either electronically or printed.  No follow-ups on file.  Rollene Northern, FNP  Subjective:    Patient ID: Claudia Andersen, female    DOB: July 16, 1941, 84 y.o.   MRN: [id number]  CC: CIENNA DUMAIS is a 84 y.o. female who presents today for follow up.   HPI: HPI Discussed the use of AI scribe software for clinical note transcription with the patient, who gave verbal consent to proceed.  History of Present Illness   Natalija Mavis is an 84 year old female who presents for follow-up after a previous visit in December and evaluation of a lip lesion.  Since her last visit in December, she has not experienced any of the 'scary episodes' she previously had. She did suffer from a severe cold, described as 'one of the worst colds I've had in a long time.' She managed her symptoms with Flonase , Astacort, Sudafed, Tylenol , and Nyquil, which alleviated her nasal congestion and dripping. No antibiotics were required as her symptoms improved with these treatments.  She is concerned about a lesion on her lower right lip. The lesion is not painful, bleeding, tender, or itching. A dental assistant and dentist have assessed it and considered it benign. She has no history of squamous cell carcinoma, basal cell carcinoma, or melanoma and does not currently follow with a dermatologist since her previous dermatologist moved away.  She is managing a wound that she has been treating since August. Initially, it was treated with antibiotic tape and is now being treated with collagen tape. She can now wear regular shoes again.  Her recent lab work from December showed normal kidney  function and resolved anemia. She monitors her Tylenol  intake carefully.     Compliant with famotidine  is 20mg  twice daily   Allergies: Nsaids Medications Ordered Prior to Encounter[1]  Review of Systems    Objective:    BP 124/70   Pulse 70   Temp 98.3 F (36.8 C) (Oral)   Ht 5' (1.524 m)   Wt 130 lb 12.8 oz (59.3 kg)   SpO2 96%   BMI 25.55 kg/m  BP Readings from Last 3 Encounters:  11/01/24 124/70  10/16/24 124/72  09/09/24 (!) 118/50   Wt Readings from Last 3 Encounters:  11/01/24 130 lb 12.8 oz (59.3 kg)  10/16/24 130 lb 6.4 oz (59.1 kg)  09/09/24 135 lb 9.6 oz (61.5 kg)    Physical Exam        [1]  Current Outpatient Medications on File Prior to Visit  Medication Sig Dispense Refill   acetaminophen  (TYLENOL ) 650 MG CR tablet Take 1,300 mg by mouth 2 (two) times daily.     aspirin  EC 81 MG tablet Take 1 tablet (81 mg total) by mouth daily. Swallow whole.     atorvastatin  (LIPITOR) 10 MG tablet TAKE ONE TABLET BY MOUTH ONCE DAILY 90 tablet 3   baclofen  (LIORESAL ) 10 MG tablet TAKE 1 TABLET BY MOUTH 3 TIMES DAILY AS NEEDED FOR MUSCLE SPASM 90 each 2   Biotin 2500 MCG CAPS Take 2,500 mcg by mouth daily.     Calcium  Carb-Cholecalciferol (CALCIUM  600 + D PO) Take 1 tablet by mouth daily.  Clotrimazole  1 % OINT Apply 1 application topically 2 (two) times daily. 56.7 g 1   diclofenac  Sodium (VOLTAREN ) 1 % GEL Apply 4 g topically 4 (four) times daily. 100 g 1   doxycycline  (VIBRA -TABS) 100 MG tablet Take 1 tablet (100 mg total) by mouth 2 (two) times daily. 10 tablet 0   DULoxetine  (CYMBALTA ) 60 MG capsule TAKE 1 CAPSULE BY MOUTH ONCE DAILY 30 capsule 3   famotidine  (PEPCID ) 20 MG tablet TAKE ONE TABLET BY MOUTH TWICE DAILY 120 tablet 3   Fluticasone  Furoate (FLONASE  SENSIMIST NA) Place 1 spray into both nostrils daily as needed (allergies).     gabapentin  (NEURONTIN ) 100 MG capsule TAKE 2 CAPSULES BY MOUTH EVERY MORNING, AND 2 CAPSULES IN MIDDAY. 120 capsule 0    gabapentin  (NEURONTIN ) 300 MG capsule TAKE 1 CAPSULE BY MOUTH EVERY EVENING ASDIRECTED 90 capsule 3   Homeopathic Products (ARNICA EX) Apply 1 Application topically as needed (apply to broken skin as needed.).     hydrochlorothiazide  (MICROZIDE ) 12.5 MG capsule TAKE 1 CAPSULE BY MOUTH DAILY AS NEEDED. 90 capsule 3   Lactobacillus (PROBIOTIC ACIDOPHILUS PO) Take 1 tablet by mouth daily.     lidocaine  (LIDODERM ) 5 % Place 1 patch onto the skin as needed (1 every 24 hours). Remove & Discard patch within 12 hours or as directed by MD     Multiple Vitamin (MULTIVITAMIN) tablet Take 1 tablet by mouth daily.     Multiple Vitamins-Minerals (ICAPS AREDS FORMULA PO) Take 1 tablet by mouth 2 (two) times daily.     mupirocin  ointment (BACTROBAN ) 2 % Apply 1 application topically 2 (two) times daily. 22 g 1   potassium chloride  SA (KLOR-CON  M) 20 MEQ tablet Take 1 tablet (20 mEq total) by mouth daily. 10 tablet 0   traZODone  (DESYREL ) 50 MG tablet TAKE TWO TABLETS BY MOUTH AT BEDTIME 180 tablet 3   triamcinolone  (KENALOG ) 0.025 % cream Apply 1 application topically 2 (two) times daily. 15 g 1   valACYclovir  (VALTREX ) 1000 MG tablet TAKE TWO TABLETS BY MOUTH EVERY 12 HOURSFOR 1 DAY. **START AS SOON AS POSSIBLE AFTER SYMPTOM ONSET** 30 tablet 1   No current facility-administered medications on file prior to visit.   "

## 2024-11-04 NOTE — Assessment & Plan Note (Signed)
 Fortunately no recurrence or prior episode.  Will monitor.

## 2024-11-04 NOTE — Assessment & Plan Note (Signed)
 Discussed patch like lesion on lip border in a sun-exposed area.  Advise that she would need dermatology follow-up.  She lives at [facility] and they have a dermatologist who visits regularly.  She will reach out to dermatology program and let me know if any issues in being evaluated.  She may require a biopsy

## 2024-11-06 ENCOUNTER — Encounter: Admitting: Physician Assistant

## 2024-11-06 DIAGNOSIS — S81811A Laceration without foreign body, right lower leg, initial encounter: Secondary | ICD-10-CM | POA: Diagnosis not present

## 2024-11-08 ENCOUNTER — Encounter: Payer: Self-pay | Admitting: Family

## 2024-11-11 ENCOUNTER — Ambulatory Visit: Payer: Self-pay | Admitting: Family

## 2024-11-11 DIAGNOSIS — D649 Anemia, unspecified: Secondary | ICD-10-CM

## 2024-11-12 ENCOUNTER — Other Ambulatory Visit

## 2024-11-13 ENCOUNTER — Encounter: Admitting: Physician Assistant

## 2024-11-13 DIAGNOSIS — S81811A Laceration without foreign body, right lower leg, initial encounter: Secondary | ICD-10-CM | POA: Diagnosis not present

## 2024-11-15 ENCOUNTER — Encounter: Payer: Self-pay | Admitting: Family

## 2024-11-18 ENCOUNTER — Other Ambulatory Visit: Payer: Self-pay | Admitting: Family

## 2024-11-18 DIAGNOSIS — M51379 Other intervertebral disc degeneration, lumbosacral region without mention of lumbar back pain or lower extremity pain: Secondary | ICD-10-CM

## 2024-11-20 ENCOUNTER — Other Ambulatory Visit

## 2024-11-20 ENCOUNTER — Encounter: Admitting: Physician Assistant

## 2024-11-20 DIAGNOSIS — D649 Anemia, unspecified: Secondary | ICD-10-CM

## 2024-11-20 LAB — CBC WITH DIFFERENTIAL/PLATELET
Basophils Absolute: 0 10*3/uL (ref 0.0–0.1)
Basophils Relative: 0.5 % (ref 0.0–3.0)
Eosinophils Absolute: 0.1 10*3/uL (ref 0.0–0.7)
Eosinophils Relative: 1.8 % (ref 0.0–5.0)
HCT: 35.6 % — ABNORMAL LOW (ref 36.0–46.0)
Hemoglobin: 12.1 g/dL (ref 12.0–15.0)
Lymphocytes Relative: 19.1 % (ref 12.0–46.0)
Lymphs Abs: 1.5 10*3/uL (ref 0.7–4.0)
MCHC: 33.9 g/dL (ref 30.0–36.0)
MCV: 98.2 fl (ref 78.0–100.0)
Monocytes Absolute: 0.7 10*3/uL (ref 0.1–1.0)
Monocytes Relative: 9.1 % (ref 3.0–12.0)
Neutro Abs: 5.6 10*3/uL (ref 1.4–7.7)
Neutrophils Relative %: 69.5 % (ref 43.0–77.0)
Platelets: 295 10*3/uL (ref 150.0–400.0)
RBC: 3.63 Mil/uL — ABNORMAL LOW (ref 3.87–5.11)
RDW: 14.4 % (ref 11.5–15.5)
WBC: 8 10*3/uL (ref 4.0–10.5)

## 2024-11-22 LAB — HAPTOGLOBIN: Haptoglobin: 115 mg/dL (ref 43–212)

## 2024-11-22 LAB — ERYTHROPOIETIN: Erythropoietin: 7 m[IU]/mL (ref 2.6–18.5)

## 2024-11-22 LAB — RETICULOCYTES
ABS Retic: 69160 {cells}/uL (ref 20000–80000)
Retic Ct Pct: 1.9 %

## 2024-11-27 ENCOUNTER — Encounter: Admitting: Physician Assistant
